# Patient Record
Sex: Female | Born: 1945 | Race: White | Hispanic: No | Marital: Married | Smoking: Former smoker
Health system: Southern US, Community
[De-identification: ages and names within clinical notes are randomized; demographics above are authoritative.]

## PROBLEM LIST (undated history)

## (undated) DIAGNOSIS — R7309 Other abnormal glucose: Secondary | ICD-10-CM

## (undated) DIAGNOSIS — N951 Menopausal and female climacteric states: Secondary | ICD-10-CM

## (undated) DIAGNOSIS — I10 Essential (primary) hypertension: Secondary | ICD-10-CM

## (undated) DIAGNOSIS — D126 Benign neoplasm of colon, unspecified: Secondary | ICD-10-CM

## (undated) DIAGNOSIS — E079 Disorder of thyroid, unspecified: Secondary | ICD-10-CM

## (undated) DIAGNOSIS — E785 Hyperlipidemia, unspecified: Secondary | ICD-10-CM

## (undated) DIAGNOSIS — Z1211 Encounter for screening for malignant neoplasm of colon: Secondary | ICD-10-CM

## (undated) DIAGNOSIS — Z1231 Encounter for screening mammogram for malignant neoplasm of breast: Secondary | ICD-10-CM

## (undated) DIAGNOSIS — G43909 Migraine, unspecified, not intractable, without status migrainosus: Secondary | ICD-10-CM

## (undated) DIAGNOSIS — G43109 Migraine with aura, not intractable, without status migrainosus: Secondary | ICD-10-CM

## (undated) DIAGNOSIS — N390 Urinary tract infection, site not specified: Secondary | ICD-10-CM

## (undated) DIAGNOSIS — K589 Irritable bowel syndrome without diarrhea: Secondary | ICD-10-CM

## (undated) DIAGNOSIS — I2510 Atherosclerotic heart disease of native coronary artery without angina pectoris: Secondary | ICD-10-CM

## (undated) DIAGNOSIS — K7581 Nonalcoholic steatohepatitis (NASH): Secondary | ICD-10-CM

## (undated) DIAGNOSIS — H3530 Unspecified macular degeneration: Secondary | ICD-10-CM

## (undated) DIAGNOSIS — K5730 Diverticulosis of large intestine without perforation or abscess without bleeding: Secondary | ICD-10-CM

## (undated) DIAGNOSIS — I38 Endocarditis, valve unspecified: Secondary | ICD-10-CM

## (undated) DIAGNOSIS — K7689 Other specified diseases of liver: Secondary | ICD-10-CM

## (undated) HISTORY — DX: Diverticulosis of large intestine without perforation or abscess without bleeding: K57.30

## (undated) HISTORY — DX: Migraine with aura, not intractable, without status migrainosus: G43.109

## (undated) HISTORY — DX: Encounter for screening for malignant neoplasm of colon: Z12.11

## (undated) HISTORY — DX: Irritable bowel syndrome, unspecified: K58.9

## (undated) HISTORY — DX: Nonalcoholic steatohepatitis (NASH): K75.81

## (undated) HISTORY — DX: Encounter for screening mammogram for malignant neoplasm of breast: Z12.31

## (undated) HISTORY — DX: Disorder of thyroid, unspecified: E07.9

## (undated) HISTORY — DX: Hyperlipidemia, unspecified: E78.5

## (undated) HISTORY — DX: Unspecified macular degeneration: H35.30

## (undated) HISTORY — PX: TONSILLECTOMY AND ADENOIDECTOMY: SUR1326

## (undated) HISTORY — DX: Benign neoplasm of colon, unspecified: D12.6

## (undated) HISTORY — DX: Other abnormal glucose: R73.09

## (undated) HISTORY — DX: Endocarditis, valve unspecified: I38

## (undated) HISTORY — DX: Migraine, unspecified, not intractable, without status migrainosus: G43.909

## (undated) HISTORY — DX: Essential (primary) hypertension: I10

## (undated) HISTORY — PX: ANKLE FRACTURE SURGERY: SHX122

## (undated) HISTORY — DX: Urinary tract infection, site not specified: N39.0

## (undated) HISTORY — PX: OTHER SURGICAL HISTORY: SHX169

## (undated) HISTORY — DX: Menopausal and female climacteric states: N95.1

## (undated) HISTORY — DX: Other specified diseases of liver: K76.89

---
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
## 2003-12-11 ENCOUNTER — Other Ambulatory Visit: Admission: RE | Admit: 2003-12-11 | Discharge: 2003-12-11 | Payer: Self-pay | Admitting: Internal Medicine

## 2004-04-06 ENCOUNTER — Encounter (INDEPENDENT_AMBULATORY_CARE_PROVIDER_SITE_OTHER): Payer: Self-pay | Admitting: Internal Medicine

## 2004-04-29 HISTORY — PX: OTHER SURGICAL HISTORY: SHX169

## 2004-09-28 ENCOUNTER — Emergency Department: Payer: Self-pay | Admitting: Internal Medicine

## 2005-04-06 ENCOUNTER — Encounter (INDEPENDENT_AMBULATORY_CARE_PROVIDER_SITE_OTHER): Payer: Self-pay | Admitting: Internal Medicine

## 2005-04-06 HISTORY — PX: OTHER SURGICAL HISTORY: SHX169

## 2005-04-17 ENCOUNTER — Other Ambulatory Visit: Admission: RE | Admit: 2005-04-17 | Discharge: 2005-04-17 | Payer: Self-pay | Admitting: Family Medicine

## 2005-04-17 ENCOUNTER — Ambulatory Visit: Payer: Self-pay | Admitting: Family Medicine

## 2005-04-18 ENCOUNTER — Ambulatory Visit: Payer: Self-pay | Admitting: Family Medicine

## 2005-04-19 ENCOUNTER — Ambulatory Visit: Payer: Self-pay | Admitting: Family Medicine

## 2005-05-06 LAB — FECAL OCCULT BLOOD, GUAIAC: Fecal Occult Blood: NEGATIVE

## 2005-05-16 ENCOUNTER — Ambulatory Visit: Payer: Self-pay | Admitting: Family Medicine

## 2005-05-31 ENCOUNTER — Ambulatory Visit: Payer: Self-pay | Admitting: Internal Medicine

## 2005-12-29 ENCOUNTER — Ambulatory Visit: Payer: Self-pay | Admitting: Family Medicine

## 2006-04-06 LAB — HM MAMMOGRAPHY: HM Mammogram: NORMAL

## 2006-04-18 ENCOUNTER — Ambulatory Visit: Payer: Self-pay | Admitting: Family Medicine

## 2006-05-04 ENCOUNTER — Ambulatory Visit: Payer: Self-pay | Admitting: Gastroenterology

## 2006-05-06 DIAGNOSIS — R7989 Other specified abnormal findings of blood chemistry: Secondary | ICD-10-CM | POA: Insufficient documentation

## 2006-05-06 DIAGNOSIS — Z8639 Personal history of other endocrine, nutritional and metabolic disease: Secondary | ICD-10-CM

## 2006-05-06 DIAGNOSIS — Z862 Personal history of diseases of the blood and blood-forming organs and certain disorders involving the immune mechanism: Secondary | ICD-10-CM

## 2006-05-11 ENCOUNTER — Ambulatory Visit: Payer: Self-pay | Admitting: Gastroenterology

## 2006-05-11 ENCOUNTER — Encounter: Payer: Self-pay | Admitting: Gastroenterology

## 2006-06-01 ENCOUNTER — Ambulatory Visit: Payer: Self-pay | Admitting: Gastroenterology

## 2006-06-08 ENCOUNTER — Encounter (INDEPENDENT_AMBULATORY_CARE_PROVIDER_SITE_OTHER): Payer: Self-pay | Admitting: Specialist

## 2006-06-08 ENCOUNTER — Ambulatory Visit: Payer: Self-pay | Admitting: Gastroenterology

## 2006-06-08 HISTORY — PX: COLONOSCOPY: SHX174

## 2006-07-06 ENCOUNTER — Encounter (INDEPENDENT_AMBULATORY_CARE_PROVIDER_SITE_OTHER): Payer: Self-pay | Admitting: *Deleted

## 2006-07-06 ENCOUNTER — Ambulatory Visit (HOSPITAL_COMMUNITY): Admission: RE | Admit: 2006-07-06 | Discharge: 2006-07-06 | Payer: Self-pay | Admitting: Gastroenterology

## 2006-07-06 ENCOUNTER — Encounter: Payer: Self-pay | Admitting: Gastroenterology

## 2006-07-20 ENCOUNTER — Ambulatory Visit: Payer: Self-pay | Admitting: Gastroenterology

## 2006-08-01 ENCOUNTER — Encounter: Admission: RE | Admit: 2006-08-01 | Discharge: 2006-10-30 | Payer: Self-pay | Admitting: Gastroenterology

## 2006-08-31 ENCOUNTER — Ambulatory Visit: Payer: Self-pay | Admitting: Family Medicine

## 2006-09-07 ENCOUNTER — Ambulatory Visit: Payer: Self-pay | Admitting: Gastroenterology

## 2006-11-16 ENCOUNTER — Ambulatory Visit: Payer: Self-pay | Admitting: Family Medicine

## 2007-11-05 ENCOUNTER — Ambulatory Visit: Payer: Self-pay | Admitting: Family Medicine

## 2007-11-05 DIAGNOSIS — H6690 Otitis media, unspecified, unspecified ear: Secondary | ICD-10-CM | POA: Insufficient documentation

## 2007-11-05 DIAGNOSIS — M810 Age-related osteoporosis without current pathological fracture: Secondary | ICD-10-CM | POA: Insufficient documentation

## 2007-12-13 ENCOUNTER — Encounter (INDEPENDENT_AMBULATORY_CARE_PROVIDER_SITE_OTHER): Payer: Self-pay | Admitting: Internal Medicine

## 2007-12-20 ENCOUNTER — Encounter (INDEPENDENT_AMBULATORY_CARE_PROVIDER_SITE_OTHER): Payer: Self-pay | Admitting: *Deleted

## 2007-12-27 ENCOUNTER — Encounter (INDEPENDENT_AMBULATORY_CARE_PROVIDER_SITE_OTHER): Payer: Self-pay | Admitting: Internal Medicine

## 2007-12-27 DIAGNOSIS — G43909 Migraine, unspecified, not intractable, without status migrainosus: Secondary | ICD-10-CM | POA: Insufficient documentation

## 2007-12-27 DIAGNOSIS — I10 Essential (primary) hypertension: Secondary | ICD-10-CM | POA: Insufficient documentation

## 2007-12-27 DIAGNOSIS — N951 Menopausal and female climacteric states: Secondary | ICD-10-CM

## 2008-01-10 ENCOUNTER — Ambulatory Visit: Payer: Self-pay | Admitting: Family Medicine

## 2008-01-10 DIAGNOSIS — R82998 Other abnormal findings in urine: Secondary | ICD-10-CM

## 2008-01-10 DIAGNOSIS — B9789 Other viral agents as the cause of diseases classified elsewhere: Secondary | ICD-10-CM

## 2008-01-10 LAB — CONVERTED CEMR LAB
Bilirubin Urine: NEGATIVE
Blood in Urine, dipstick: NEGATIVE
Casts: 0 /lpf
Epithelial cells, urine: 0 /lpf
Glucose, Urine, Semiquant: NEGATIVE
Ketones, urine, test strip: NEGATIVE
Nitrite: NEGATIVE
Protein, U semiquant: NEGATIVE
RBC / HPF: 0
Specific Gravity, Urine: <1.005
Urine crystals, microscopic: 0 /hpf
Urobilinogen, UA: NEGATIVE
pH: 7

## 2008-09-11 ENCOUNTER — Ambulatory Visit: Payer: Self-pay | Admitting: Family Medicine

## 2008-09-11 DIAGNOSIS — J069 Acute upper respiratory infection, unspecified: Secondary | ICD-10-CM | POA: Insufficient documentation

## 2008-09-11 DIAGNOSIS — N390 Urinary tract infection, site not specified: Secondary | ICD-10-CM | POA: Insufficient documentation

## 2008-09-11 LAB — CONVERTED CEMR LAB
Bilirubin Urine: NEGATIVE
Glucose, Urine, Semiquant: NEGATIVE
Specific Gravity, Urine: 1.015
pH: 7.5

## 2009-05-13 ENCOUNTER — Telehealth: Payer: Self-pay | Admitting: Gastroenterology

## 2009-09-10 ENCOUNTER — Ambulatory Visit: Payer: Self-pay | Admitting: Family Medicine

## 2009-09-10 LAB — CONVERTED CEMR LAB
Ketones, urine, test strip: NEGATIVE
Nitrite: NEGATIVE
Urobilinogen, UA: 0.2

## 2009-11-09 ENCOUNTER — Encounter (INDEPENDENT_AMBULATORY_CARE_PROVIDER_SITE_OTHER): Payer: Self-pay | Admitting: Internal Medicine

## 2010-08-04 ENCOUNTER — Telehealth (INDEPENDENT_AMBULATORY_CARE_PROVIDER_SITE_OTHER): Payer: Self-pay | Admitting: *Deleted

## 2010-08-05 ENCOUNTER — Ambulatory Visit: Payer: Self-pay | Admitting: Family Medicine

## 2010-08-07 LAB — CONVERTED CEMR LAB
AST: 41 units/L — ABNORMAL HIGH (ref 0–37)
Alkaline Phosphatase: 46 units/L (ref 39–117)
Bilirubin, Direct: 0.2 mg/dL (ref 0.0–0.3)
Cholesterol: 185 mg/dL (ref 0–200)
GFR calc non Af Amer: 98.95 mL/min (ref >60)
LDL Cholesterol: 114 mg/dL — ABNORMAL HIGH (ref 0–99)
Potassium: 4.6 meq/L (ref 3.5–5.1)
Sodium: 139 meq/L (ref 135–145)
TSH: 2.77 microintl units/mL (ref 0.35–5.50)
Total Bilirubin: 0.7 mg/dL (ref 0.3–1.2)
VLDL: 29 mg/dL (ref 0.0–40.0)

## 2010-08-08 LAB — CONVERTED CEMR LAB: Vit D, 25-Hydroxy: 33 ng/mL (ref 30–89)

## 2010-08-12 ENCOUNTER — Ambulatory Visit: Payer: Self-pay | Admitting: Family Medicine

## 2010-08-12 DIAGNOSIS — R739 Hyperglycemia, unspecified: Secondary | ICD-10-CM

## 2010-08-24 ENCOUNTER — Encounter (INDEPENDENT_AMBULATORY_CARE_PROVIDER_SITE_OTHER): Payer: Self-pay | Admitting: *Deleted

## 2010-10-20 ENCOUNTER — Telehealth: Payer: Self-pay | Admitting: Gastroenterology

## 2010-10-21 ENCOUNTER — Ambulatory Visit: Payer: Self-pay | Admitting: Gastroenterology

## 2010-10-28 ENCOUNTER — Ambulatory Visit: Payer: Self-pay | Admitting: Gastroenterology

## 2010-11-04 DIAGNOSIS — K760 Fatty (change of) liver, not elsewhere classified: Secondary | ICD-10-CM | POA: Insufficient documentation

## 2010-11-04 DIAGNOSIS — Z860100 Personal history of colon polyps, unspecified: Secondary | ICD-10-CM | POA: Insufficient documentation

## 2010-11-04 DIAGNOSIS — K5730 Diverticulosis of large intestine without perforation or abscess without bleeding: Secondary | ICD-10-CM | POA: Insufficient documentation

## 2010-11-04 DIAGNOSIS — K7689 Other specified diseases of liver: Secondary | ICD-10-CM

## 2010-11-11 ENCOUNTER — Other Ambulatory Visit: Payer: Self-pay | Admitting: Gastroenterology

## 2010-11-11 ENCOUNTER — Ambulatory Visit
Admission: RE | Admit: 2010-11-11 | Discharge: 2010-11-11 | Payer: Self-pay | Source: Home / Self Care | Attending: Gastroenterology | Admitting: Gastroenterology

## 2010-11-11 ENCOUNTER — Encounter: Payer: Self-pay | Admitting: Gastroenterology

## 2010-11-11 ENCOUNTER — Encounter (INDEPENDENT_AMBULATORY_CARE_PROVIDER_SITE_OTHER): Payer: Self-pay | Admitting: *Deleted

## 2010-11-11 DIAGNOSIS — K589 Irritable bowel syndrome without diarrhea: Secondary | ICD-10-CM | POA: Insufficient documentation

## 2010-11-11 LAB — CBC WITH DIFFERENTIAL/PLATELET
Basophils Absolute: 0 10*3/uL (ref 0.0–0.1)
Basophils Relative: 0.6 % (ref 0.0–3.0)
Eosinophils Absolute: 0.1 10*3/uL (ref 0.0–0.7)
Eosinophils Relative: 1.4 % (ref 0.0–5.0)
HCT: 41 % (ref 36.0–46.0)
Hemoglobin: 14.4 g/dL (ref 12.0–15.0)
Lymphocytes Relative: 45.7 % (ref 12.0–46.0)
Lymphs Abs: 2.4 10*3/uL (ref 0.7–4.0)
MCHC: 35 g/dL (ref 30.0–36.0)
MCV: 92.3 fl (ref 78.0–100.0)
Monocytes Absolute: 0.4 10*3/uL (ref 0.1–1.0)
Monocytes Relative: 6.9 % (ref 3.0–12.0)
Neutro Abs: 2.4 10*3/uL (ref 1.4–7.7)
Neutrophils Relative %: 45.4 % (ref 43.0–77.0)
Platelets: 182 10*3/uL (ref 150.0–400.0)
RBC: 4.45 Mil/uL (ref 3.87–5.11)
RDW: 12.5 % (ref 11.5–14.6)
WBC: 5.2 10*3/uL (ref 4.5–10.5)

## 2010-11-11 LAB — IBC PANEL
Iron: 76 ug/dL (ref 42–145)
Saturation Ratios: 18.5 % — ABNORMAL LOW (ref 20.0–50.0)
Transferrin: 293.1 mg/dL (ref 212.0–360.0)

## 2010-11-11 LAB — FOLATE: Folate: 8.7 ng/mL

## 2010-11-11 LAB — FERRITIN: Ferritin: 156 ng/mL (ref 10.0–291.0)

## 2010-11-11 LAB — IGA: IgA: 325 mg/dL (ref 68–378)

## 2010-11-11 LAB — VITAMIN B12: Vitamin B-12: 238 pg/mL (ref 211–911)

## 2010-11-18 ENCOUNTER — Encounter: Payer: Self-pay | Admitting: Gastroenterology

## 2010-11-18 ENCOUNTER — Ambulatory Visit
Admission: RE | Admit: 2010-11-18 | Discharge: 2010-11-18 | Payer: Self-pay | Source: Home / Self Care | Attending: Gastroenterology | Admitting: Gastroenterology

## 2010-11-18 DIAGNOSIS — E538 Deficiency of other specified B group vitamins: Secondary | ICD-10-CM | POA: Insufficient documentation

## 2010-11-18 LAB — HM COLONOSCOPY

## 2010-11-21 ENCOUNTER — Telehealth: Payer: Self-pay | Admitting: Family Medicine

## 2010-11-22 ENCOUNTER — Encounter: Payer: Self-pay | Admitting: Gastroenterology

## 2010-11-25 ENCOUNTER — Ambulatory Visit
Admission: RE | Admit: 2010-11-25 | Discharge: 2010-11-25 | Payer: Self-pay | Source: Home / Self Care | Attending: Family Medicine | Admitting: Family Medicine

## 2010-12-02 ENCOUNTER — Ambulatory Visit
Admission: RE | Admit: 2010-12-02 | Discharge: 2010-12-02 | Payer: Self-pay | Source: Home / Self Care | Attending: Family Medicine | Admitting: Family Medicine

## 2010-12-06 NOTE — Progress Notes (Signed)
----   Converted from flag ---- ---- 08/04/2010 1:46 PM, Elsie Stain MD wrote: vit D 733.0 cmet/lipid 401.1  ---- 08/04/2010 9:58 AM, Daralene Milch CMA (AAMA) wrote: Lab orders please! Good Morning! This pt is scheduled for cpx labs tomorrow, which labs to draw and dx codes to use? Thanks Tasha ------------------------------

## 2010-12-06 NOTE — Letter (Signed)
Summary: Cancer Registry Form/[facility] Form/[facility]   Imported By: Edmonia James 11/22/2009 08:50:33  _____________________________________________________________________  External Attachment:    Type:   Image     Comment:   External Document

## 2010-12-06 NOTE — Letter (Signed)
Summary: Pre Visit Letter Revised  [facility]  [place], [place] [address]   Phone: [phone]  Fax: [phone]        08/24/2010 MRN: [id number] [facility] [address] [place], [place]  [address]             Procedure Date:  10/28/2010   Welcome to the Gastroenterology Division at [facility].    You are scheduled to see a nurse for your pre-procedure visit on 10/14/2010 at 1:30PM on the 3rd floor at [facility], [place] [employer].  We ask that you try to arrive at our office 15 minutes prior to your appointment time to allow for check-in.  Please take a minute to review the attached form.  If you answer "Yes" to one or more of the questions on the first page, we ask that you call the person listed at your earliest opportunity.  If you answer "No" to all of the questions, please complete the rest of the form and bring it to your appointment.    Your nurse visit will consist of discussing your medical and surgical history, your immediate family medical history, and your medications.   If you are unable to list all of your medications on the form, please bring the medication bottles to your appointment and we will list them.  We will need to be aware of both prescribed and over the counter drugs.  We will need to know exact dosage information as well.    Please be prepared to read and sign documents such as consent forms, a financial agreement, and acknowledgement forms.  If necessary, and with your consent, a friend or relative is welcome to sit-in on the nurse visit with you.  Please bring your insurance card so that we may make a copy of it.  If your insurance requires a referral to see a specialist, please bring your referral form from your primary care physician.  No co-pay is required for this nurse visit.     If you cannot keep your appointment, please call [phone] to cancel or reschedule prior to your appointment date.  This  allows [place] the opportunity to schedule an appointment for another patient in need of care.    Thank you for choosing [facility] for your medical needs.  We appreciate the opportunity to care for you.  Please visit [place] at our website  to learn more about our practice.  Sincerely, The Gastroenterology Division

## 2010-12-06 NOTE — Assessment & Plan Note (Signed)
Summary: CPX/BILLIE'S PT/CLE   Vital Signs:  Patient profile:   65 year old female Height:      61.75 inches Weight:      164.50 pounds BMI:     30.44 Temp:     98.4 degrees F oral Pulse rate:   84 / minute Pulse rhythm:   regular BP sitting:   132 / 80  (left arm) Cuff size:   regular  Vitals Entered By: [place] Deborra Medina) 23-Aug-2010 1:59 PM) CC: CPX (BDB pt)   History of Present Illness: Osteoporosis.  Is due for DXA.  Prev had been on fosamax for 5 years per patient  Occ fatigue noted after high carb meals, esp in afternoon.   No FCNAVD.  Occ trouble with fecal urgency.  Worse with certain food (iceberg lettuce and nuts).    H/o fatty liver.  LFTs d/w patient.  See plan.   Hyperglycemia d/w patient.   See plan.   Pt is doing breast exams and hasn't noticed lump, mass, change, discharge.  No other c/o of any variety.   Allergies: 1)  ! Pcn 2)  ! 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 Sharlett Iles)  Family History: Reviewed history and no changes required. F dead, MI at 67 M alive, in [place], dx'd with manic depression/parkinson's, DM2  Social History: Reviewed history and no changes required. 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  no murmur PULM: ctab, no inc wob ABD: soft, +bs EXT: no edema SKIN: no acute rash    Impression & Recommendations:  Problem # 1:  OTHER SCREENING MAMMOGRAM (ICD-V76.12) Refer for mammogram.  Orders: Radiology Referral (Radiology)  Problem # 2:  LIVER FUNCTION TESTS, ABNORMAL, HX OF (ICD-V12.2) D/w patient [id number] loss. We can follow this yearly.   Problem # 3:  OSTEOPOROSIS (ICD-733.00) Send for DXA.  No need for further fosamax.  The following medications were removed from the medication list:    Fosamax 70 Mg Tabs (Alendronate sodium) .Marland Kitchen... 1 every wk    Caltrate 600+d 600-400 Mg-unit Tabs (Calcium carbonate-vitamin d) .Marland Kitchen... Take 1 tablet by mouth two times a day  Orders: Radiology Referral (Radiology)  Problem # 4:  HYPERGLYCEMIA (ICD-790.29) D/w patient [id number] loss, diet and exercise.   Problem # 5:  SCREENING, COLON CANCER (ICD-V76.51) Refer to GI.  Orders: Gastroenterology Referral (GI)  Other Orders: Flu Vaccine 19yr + MEDICARE PATIENTS ((J4782 Administration Flu vaccine - MCR (G0008) Tdap => 725yrIM (9(95621Pneumococcal Vaccine ([phone] 1st Vaccine ([phone] of  Any Addtl Vaccine (37096)  Patient Instructions: 1)  See Rosaria Ferries about your referral before your leave today.   2)  Work on exercising more and gradually losing weight.   3)  Check with your insurance to see if they will cover the shingles shot.   4)  Recheck in 1 year, sooner as needed.  5)  Take care, glad to see you today.   Prior Medications (reviewed today): None Current Allergies (reviewed today): ! PCN ! SULFA   Immunizations Administered:  Tetanus Vaccine:    Vaccine Type: Tdap    Site: left deltoid    Mfr:  GlaxoSmithKline    Dose: 0.5 ml    Route: IM    Given by: Christena Deem CMA ([place])    Exp. Date: 07/18/2012    Lot #: [id number]    VIS given: 09/23/08 version given August 12, 2010.  Pneumonia Vaccine:    Vaccine Type: Pneumovax (Medicare)    Site: right deltoid    Mfr: Merck    Dose: 0.5 ml    Route: IM    Given by: Christena Deem CMA ([place])    Exp. Date: 01/16/2014    Lot #: [id number]    VIS given: 10/11/09 version given August 12, 2010. Flu Vaccine Consent Questions     Do you have a history of severe allergic reactions to this vaccine? no    Any prior history of allergic reactions to egg and/or gelatin? no    Do you have a sensitivity to the preservative Thimersol? no    Do you have a past history of Guillan-Barre Syndrome? no    Do you currently have an acute febrile illness? no    Have you ever had a severe reaction to latex? no    Vaccine information given and explained to patient? yes    Are you currently pregnant? no    Lot Number:[id number]   Exp Date:05/06/2011   Site Given  Left Deltoid IMedflu Lugene Fuquay CMA (AAMA)  August 12, 2010 3:26 PM

## 2010-12-08 NOTE — Progress Notes (Signed)
Summary: needs B12 injections  Phone Note Call from Patient Call back at Work Phone [phone]   Caller: Mom Summary of Call: Pt had lab work with Dr Sharlett Iles and her B12 level came back low.  She needs to start getting monthly injections and wants to get them here, but she would prefer to get nascobal instead of injections.  Uses [facility] [address]. Initial call taken by: Marty Heck CMA, AAMA,  November 21, 2010 8:51 AM  Follow-up for Phone Call        Please have GI send in the rx so she can do the nasal spray per their directions.  It was listed as an option on the prev GI append from 11/18/09.  Follow-up by: Elsie Stain MD,  November 21, 2010 1:28 PM  Additional Follow-up for Phone Call Additional follow up Details #1::        OK... Additional Follow-up by: Sable Feil MD [place] Saa 2:58 PM     Appended Document: needs B12 injections     Phone Note Outgoing Call Call back at Work Phone [phone]   Call placed by: Bernita Buffy CMA Deborra Medina),  November 21, 2010 4:30 PM Call placed to: Patient Summary of Call: Left a message on patients machine to call back. patient can do nascobal but needs the three induction injections once a week for three weeks then she can have an rx for nascobal. Initial call taken by: Bernita Buffy CMA Deborra Medina),  November 21, 2010 4:30 PM  Follow-up for Phone Call        spoke to the patient and advised her she can do the nascobal once she has had the three induction injections. See patients meds or the bottomo of the last labs. Patient would like to have the 3 injections once a week done at Dr. Carole Civil office I will route this message to his CMA and have her contact the patient about setting up the injections. I will rx nascobal once all three injections are done.  Follow-up by: Bernita Buffy CMA Deborra Medina),  November 22, 2010 8:38 AM  Additional Follow-up for Phone Call Additional follow up Details #1::        Scheduled   patient for her injections.  Lacretia Nicks  November 22, 2010 9:45 AM     Additional Follow-up for Phone Call Additional follow up Details #2::    thanks Follow-up by: Sable Feil MD Marval Regal,  November 22, 2010 10:55 AM

## 2010-12-08 NOTE — Assessment & Plan Note (Signed)
Summary: b12/alc   Nurse Visit   Allergies: 1)  ! Pcn 2)  ! Sulfa  Medication Administration  Injection # 1:    Medication: Vit B12 1000 mcg    Diagnosis: VITAMIN B12 DEFICIENCY (ICD-266.2)    Route: IM    Site: L deltoid    Exp Date: 09/05/2012    Lot #: [id number]    Mfr: American Regent    Comments: Per Dr. Damita Dunnings    Patient tolerated injection without complications    Given by: Maudie Mercury Dance CMA ([place]) (December 02, 2010 8:20 AM)  Orders Added: 1)  Admin of Therapeutic Inj  intramuscular or subcutaneous [96372] 2)  Vit B12 1000 mcg [A6301]

## 2010-12-08 NOTE — Progress Notes (Signed)
Summary: re: appt   Phone Note Outgoing Call   Call placed by: Randall Hiss RN,  October 20, 2010 11:15 AM Call placed to: Patient Summary of Call: While preparing pt's chart for PV on 10-21-10, writer noted that Dr. Sharlett Iles wants pt to have an OV before next colonscopy (pt was due in 2010 and was contacted by nurse to set up [place] before next colonoscopy but pt was not able to do so).  Attempted to reach pt to set up appt but message left on cell phone. Initial call taken by: Randall Hiss RN,  October 20, 2010 11:16 AM  Follow-up for Phone Call        Spoke with pt and above situation explained.  OV set up for 11-11-09 at 11:30 and pt told to come to the third floor.  Understanding voiced and pt has no further questions at this time Follow-up by: Randall Hiss RN,  October 20, 2010 11:17 AM

## 2010-12-08 NOTE — Assessment & Plan Note (Signed)
Summary: b12/alc   Nurse Visit   Allergies: 1)  ! Pcn 2)  ! Sulfa  Medication Administration  Injection # 1:    Medication: Vit B12 1000 mcg    Diagnosis: VITAMIN B12 DEFICIENCY (ICD-266.2)    Route: IM    Site: L deltoid    Exp Date: 06/05/2012    Lot #: [id number]    Mfr: [place]    Patient tolerated injection without complications    Given by: Christena Deem CMA Deborra Medina) (November 28, 2010 9:44 AM)  Orders Added: 1)  Admin of Therapeutic Inj  intramuscular or subcutaneous [96372] 2)  Vit B12 1000 mcg [J3420]   Medication Administration  Injection # 1:    Medication: Vit B12 1000 mcg    Diagnosis: VITAMIN B12 DEFICIENCY (ICD-266.2)    Route: IM    Site: L deltoid    Exp Date: 06/05/2012    Lot #: [id number]    Mfr: American Regent    Patient tolerated injection without complications    Given by: Christena Deem CMA ([place]) (November 28, 2010 9:44 AM)  Orders Added: 1)  Admin of Therapeutic Inj  intramuscular or subcutaneous [96372] 2)  Vit B12 1000 mcg [B8485]

## 2010-12-08 NOTE — Letter (Signed)
Summary: [facility] Instructions  [facility]  [place], [place] [address]   Phone: [phone]  Fax: [phone]       Susan [name]    1946-07-10    MRN: [id number]        Procedure Day /Date: Friday 11/18/2010     Arrival Time: 2:00pm     Procedure Time: 3:00pm     Location of Procedure:                    X  [facility] (4th Floor)   [place]   Starting 5 days prior to your procedure 11/13/2010 do not eat nuts, seeds, popcorn, corn, beans, peas,  salads, or any raw vegetables.  Do not take any fiber supplements (e.g. Metamucil, Citrucel, and Benefiber).  THE DAY BEFORE YOUR PROCEDURE         Thursday 11/17/2010  1.  Drink clear liquids the entire day-NO SOLID FOOD  2.  Do not drink anything colored red or purple.  Avoid juices with pulp.  No orange juice.  3.  Drink at least 64 oz. (8 glasses) of fluid/clear liquids during the day to prevent dehydration and help the prep work efficiently.  CLEAR LIQUIDS INCLUDE: Water Jello Ice Popsicles Tea (sugar ok, no milk/cream) Powdered fruit flavored drinks Coffee (sugar ok, no milk/cream) Gatorade Juice: apple, white grape, white cranberry  Lemonade Clear bullion, consomm, broth Carbonated beverages (any kind) Strained chicken noodle soup Hard Candy                             4.  In the morning, mix first dose of MoviPrep solution:    Empty 1 Pouch A and 1 Pouch B into the disposable container    Add lukewarm drinking water to the top line of the container. Mix to dissolve    Refrigerate (mixed solution should be used within 24 hrs)  5.  Begin drinking the prep at 5:00 p.m. The MoviPrep container is divided by 4 marks.   Every 15 minutes drink the solution down to the next mark (approximately 8 oz) until the full liter is complete.   6.  Follow completed prep with 16 oz of clear liquid of your choice (Nothing red or purple).  Continue to  drink clear liquids until bedtime.  7.  Before going to bed, mix second dose of MoviPrep solution:    Empty 1 Pouch A and 1 Pouch B into the disposable container    Add lukewarm drinking water to the top line of the container. Mix to dissolve    Refrigerate  THE DAY OF YOUR PROCEDURE      Friday 11/18/2010  Beginning at 10:00am (5 hours before procedure):         1. Every 15 minutes, drink the solution down to the next mark (approx 8 oz) until the full liter is complete.  2. Follow completed prep with 16 oz. of clear liquid of your choice.    3. You may drink clear liquids until 1:00pm (2 HOURS BEFORE PROCEDURE).   MEDICATION INSTRUCTIONS  Unless otherwise instructed, you should take regular prescription medications with a small sip of water   as early as possible the morning of your procedure.           OTHER INSTRUCTIONS  You will need a responsible adult at least 66 years of age to accompany you and  drive you home.   This person must remain in the waiting room during your procedure.  Wear loose fitting clothing that is easily removed.  Leave jewelry and other valuables at home.  However, you may wish to bring a book to read or  an iPod/MP3 player to listen to music as you wait for your procedure to start.  Remove all body piercing jewelry and leave at home.  Total time from sign-in until discharge is approximately 2-3 hours.  You should go home directly after your procedure and rest.  You can resume normal activities the  day after your procedure.  The day of your procedure you should not:   Drive   Make legal decisions   Operate machinery   Drink alcohol   Return to work  You will receive specific instructions about eating, activities and medications before you leave.    The above instructions have been reviewed and explained to me by   _______________________    I fully understand and can verbalize these instructions  _____________________________ Date _________

## 2010-12-08 NOTE — Assessment & Plan Note (Signed)
Summary: REV before colonoscopy/kw    History of Present Illness Visit Type: new patient  Primary GI MD: Verl Blalock MD [place] Primary Provider: Elsie Stain, MD  Requesting Provider: na Chief Complaint: Consult colon.  Pt c/o diarrhea, fecal incontinence, and fatigue  History of Present Illness:   65 year old Caucasian female who has periodic rectal urgency and incontinency with previous colonoscopy 5 years ago  showing an adenomatous polyp that was removed. There was high-grade dysplasia in this polyp. She currently is asymptomatic except for periodic incontinency and rectal urgency. She does not use sorbitol or fructose. She denies abdominal pain or other GI complaints. Recent laboratory data showed a normal metabolic profile. I have worked her up in the past for abnormal liver function test, and liver biopsy was consistent with fatty liver,NASH syndrome and early fibrosis. She has no symptoms of progressive liver disease, and her liver function tests have been stable.    GI Review of Systems      Denies abdominal pain, acid reflux, belching, bloating, chest pain, dysphagia with liquids, dysphagia with solids, heartburn, loss of appetite, nausea, vomiting, vomiting blood, weight loss, and  weight gain.      Reports diarrhea and  fecal incontinence.     Denies anal fissure, black tarry stools, change in bowel habit, constipation, diverticulosis, heme positive stool, hemorrhoids, irritable bowel syndrome, jaundice, light color stool, liver problems, rectal bleeding, and  rectal pain.    Current Medications (verified): 1)  Tylenol 325 Mg Tabs (Acetaminophen) .... As Needed 2)  Ibuprofen 200 Mg Tabs (Ibuprofen) .... As Needed  Allergies (verified): 1)  ! Pcn 2)  ! Sulfa  Past History:  Past medical, surgical, family and social histories (including risk factors) reviewed for relevance to current acute and chronic problems.  Past Medical History: Hypertension- elevated at  clinic but had been wnl outside of clinic Osteoporosis- on fosamax for 5 years [place] (ICD-562.10) OTHER CHRONIC NONALCOHOLIC LIVER DISEASE (YYT-035.8) SCREENING, COLON CANCER (ICD-V76.51) OTHER SCREENING MAMMOGRAM (ICD-V76.12) HYPERGLYCEMIA (ICD-790.29) URI (ICD-465.9) UTI (ICD-599.0) OTHER NONSPECIFIC FINDING EXAMINATION OF URINE (ICD-791.9) VIRAL INFECTION (ICD-079.99) MIGRAINE HEADACHE (ICD-346.90) LIVER FUNCTION TESTS, ABNORMAL, HX OF (ICD-V12.2) MENOPAUSAL SYNDROME (ICD-627.2) OSTEOPOROSIS (ICD-733.00) LOM (ICD-382.9)  Past Surgical History: Reviewed history from 08/12/2010 and no changes required. BTL 30+ yrs ago T & A  age 63 pinned R) ankle -- ~2002 ABD [place] 04/12/2004  pos gallstones / 6/06 gallstones CT ABD pos gallstones 04/29/04 US pelvis neg --6/06 colonoscopy --polyps divertics 06/08/06 (Dr. Sharlett Iles)  Family History: Reviewed history from 08/12/2010 and no changes required. F dead, MI at 67 M alive, in NH, dx'd with manic depression/parkinson's, DM2 No FH of Colon Cancer: Family History of Colon Polyps:Father   Social History: Reviewed history from 08/12/2010 and no changes required. 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  The patient denies anemia, anxiety-new, blood in urine, breast changes/lumps, change in vision, confusion, cough, coughing up blood, depression-new, fainting, fever, headaches-new, hearing problems, heart murmur, heart rhythm changes, itching, menstrual pain, muscle pains/cramps, night sweats, nosebleeds, pregnancy symptoms, shortness of breath, skin rash, sleeping problems, sore throat, swelling of feet/legs, swollen  lymph glands, thirst - excessive , urination - excessive ,  urination changes/pain, urine leakage, vision changes, and voice change.    Vital Signs:  Patient profile:   65 year old female Height:      61.75 inches Weight:      164 pounds BMI:     30.35 BSA:     1.75 Pulse rate:   90 / minute Pulse rhythm:   regular BP sitting:   132 / 80  (left arm) Cuff size:   regular  Vitals Entered By: Hope Pigeon CMA (November 11, 2010 11:34 AM) CC: Consult colon.  Pt c/o diarrhea, fecal incontinence, and fatigue    Physical Exam  General:  Well developed, well nourished, no acute distress.healthy appearing. No stigmata of chronic liver disease noted. healthy appearing and obese.   Head:  Normocephalic and atraumatic. Eyes:  PERRLA, no icterus.exam deferred to patient's ophthalmologist.   Neck:  Supple; no masses or thyromegaly. Lungs:  Clear throughout to auscultation. Heart:  Regular rate and rhythm; no murmurs, rubs,  or bruits. Abdomen:  Soft, nontender and nondistended. No masses, hepatosplenomegaly or hernias noted. Normal bowel sounds. Rectal:  Normal exam.There is absolutely no rectal tone noted and no squeeze pressure on exam. There is stool in the rectal vault that is very soft and guaiac-negative. I cannot appreciate any masses or tenderness or evidence of rectocele. Extremities:  No clubbing, cyanosis, edema or deformities noted. Neurologic:  Alert and  oriented x4;  grossly normal neurologically. Psych:  Alert and cooperative. Normal mood and affect.   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 Her symptoms also suggest possible malabsorption of non-digestible carbohydrates. Screen for malabsorption has been ordered and we will followup on her colonoscopy exam. She may need referral for anorectal manometry exam. Orders: TLB-B12, Serum-Total  ONLY (40814-G81) TLB-Ferritin (85631-SHF) TLB-Folic Acid (Folate) (02637-CHY) TLB-IBC Pnl (Iron/FE;Transferrin) (83550-IBC) TLB-CBC Platelet - w/Differential (85025-CBCD) TLB-IgA (Immunoglobulin A) (82784-IGA) T-Sprue Panel (Celiac Disease Aby Eval) (83516x3/[address]) Colonoscopy (Colon)  Problem # 2:  COLONIC POLYPS, HX OF (ICD-V12.72) Assessment: Unchanged Colonoscopy scheduled her convenience. Orders: TLB-B12, Serum-Total ONLY (85027-X41) TLB-Ferritin (28786-VEH) TLB-Folic Acid (Folate) (20947-SJG) TLB-IBC Pnl (Iron/FE;Transferrin) (83550-IBC) TLB-CBC Platelet - w/Differential (85025-CBCD) TLB-IgA (Immunoglobulin A) (82784-IGA) T-Sprue Panel (Celiac Disease Aby Eval) (83516x3/[address]) Colonoscopy (Colon)  Problem # 3:  LIVER FUNCTION TESTS, ABNORMAL, HX OF (ICD-V12.2) Assessment: Unchanged  Known NASH syndrome with stable fibrosis. We will continue to follow her liver function test periodically. There's been no evidence of hepatic decompensation.  Patient Instructions: 1)  Copy sent to : Elsie Stain, MD  2)  Your procedure has been scheduled for 11/18/2010, please follow the seperate instructions.  3)  Your prescription(s) have been sent to you pharmacy.  4)  Please go to the basement today for your labs.  5)  [place] Patient Information Guide given to patient.  6)  Colonoscopy and Flexible Sigmoidoscopy brochure given.  7)  The medication list was reviewed and reconciled.  All changed / newly prescribed medications were explained.  A complete medication list was provided to the patient / caregiver. Prescriptions: MOVIPREP 100 GM  SOLR (PEG-KCL-NACL-NASULF-NA ASC-C) As per prep instructions.  #1 x 0   Entered by:   Bernita Buffy CMA ([place])   Authorized by:   Sable Feil MD [facility]   Signed by:   Bernita Buffy CMA ([place]) on 11/11/2010   Method used:   Electronically to  [facility]  [address] [phone].* (retail)       [address]       [place], [place]  [address]       Ph: [phone] or [phone]       Fax: [phone]   RxID:   [id number]

## 2010-12-08 NOTE — Letter (Signed)
Summary: Patient Notice- Polyp Results  [facility]  [address] [place], [place] [address]   Phone: [phone]  Fax: [phone]        November 22, 2010 MRN: [id number]    [facility] [address] [place], [place]  [address]    Dear Ms. Holte,  I am pleased to inform you that the colon polyp(s) removed during your recent colonoscopy was (were) found to be benign (no cancer detected) upon pathologic examination.  I recommend you have a repeat colonoscopy examination in 3_ years to look for recurrent polyps, as having colon polyps increases your risk for having recurrent polyps or even colon cancer in the future.I would recommend followup in 3 years per multiple right colon adenomas.  Should you develop new or worsening symptoms of abdominal pain, bowel habit changes or bleeding from the rectum or bowels, please schedule an evaluation with either your primary care physician or with me.  Additional information/recommendations:  __ No further action with gastroenterology is needed at this time. Please      follow-up with your primary care physician for your other healthcare      needs.  __ Please call [phone] to schedule a return visit to review your      situation.  __ Please keep your follow-up visit as already scheduled.  x__ Continue treatment plan as outlined the day of your exam.  Please call us if you are having persistent problems or have questions about your condition that have not been fully answered at this time.  Sincerely,  Sable Feil MD [facility]  This letter has been electronically signed by your physician.  Appended Document: Patient Notice- Polyp Results Letter mailed

## 2010-12-08 NOTE — Procedures (Signed)
Summary: Colonoscopy & Pathology   Colonoscopy  Procedure date:  06/08/2006  Findings:      Location:  [facility].   Patient Name: Susan [name], Susan [name] MRN:  Procedure Procedures: Colonoscopy CPT: [phone].  Personnel: Endoscopist: Loralee Pacas. Sharlett Iles, MD.  Exam Location: Exam performed in Outpatient Clinic. Outpatient  Patient Consent: Procedure, Alternatives, Risks and Benefits discussed, consent obtained, from patient. Consent was obtained by the RN.  Indications  Average Risk Screening Routine.  History  Current Medications: Patient is not currently taking Coumadin.  Pre-Exam Physical: Performed Jun 08, 2006. Cardio-pulmonary exam, Rectal exam, Abdominal exam, Extremity exam, Mental status exam WNL.  Comments: Pt. history reviewed/updated, physical exam performed prior to initiation of sedation? Exam Exam: Extent of exam reached: Cecum, extent intended: Cecum.  The cecum was identified by appendiceal orifice and IC valve. Patient position: on left side. Time for Withdrawl: 11:06:23. Colon retroflexion performed. Images taken. ASA Classification: II. Tolerance: excellent.  Monitoring: Pulse and BP monitoring, Oximetry used. Supplemental O2 given. at 2 Liters.  Colon Prep Used Golytely for colon prep. Prep results: excellent.  Sedation Meds: Patient assessed and found to be appropriate for moderate (conscious) sedation. Fentanyl 75 mcg. given IV. Versed 8 mg. given IV.  Instrument(s): CF 140L. Serial [id number].  Findings - DIVERTICULOSIS: Descending Colon to Sigmoid Colon. Not bleeding. ICD9: Diverticulosis, Colon: 562.10.  - NORMAL EXAM: Cecum to Rectum. Not Seen: Polyps. AVM's. Colitis. Tumors. Melanosis. Crohn's. Hemorrhoids.  - POLYP: Sigmoid Colon, Maximum size: 12 mm. pedunculated polyp. Procedure:  snare with cautery, removed, retrieved, Polyp sent to pathology. ICD9: Colon Polyps: 211.3.   Assessment  Diagnoses: 211.3: Colon Polyps.   562.10: Diverticulosis, Colon.   Events  Unplanned Interventions: No intervention was required.  Plans  Post Exam Instructions: No aspirin or non-steroidal containing medications: 2 .  Medication Plan: Await pathology.  Patient Education: Patient given standard instructions for: Polyps. Diverticulosis. Patient instructed to get routine colonoscopy every 3 years.  Disposition: After procedure patient sent to recovery. After recovery patient sent home.  Scheduling/Referral: Follow-Up prn. Await pathology to schedule patient.    CC: Teresa Pelton, MD  This report was created from the original endoscopy report, which was reviewed and signed by the above listed endoscopist.    SP Surgical Pathology - STATUS: Final             By: Lunette Stands,       Perform Date: 3 Aug07 00:01  Ordered By: Gwen Pounds        Ordered Date: 6 Aug07 14:09  Facility: [facility]                               Department: [facility]   [address] [place], [place] [address]   Telephone [phone] or [phone] Fax [phone]    REPORT OF SURGICAL PATHOLOGY    Case #: [id number]   Patient Name: Susan [name], Susan T.   Office Chart Number: N/A    MRN: [id number]   Pathologist: Chrystie Nose. 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   SEE DESCRIPTION.    COMMENT  There is high grade glandular dysplasia within the polyp. The   area of dysplasia focally is associated with fibrosis and   inflammation but invasion through the muscularis mucosa is not   identified. This area does not involve the cauterized margin of   the polyp and the cauterized margin of the polyp is not involved   by adenomatous change.  (JP:jy) 06/14/06    mj   Date Reported: 06/14/2006 Chrystie Nose. Saralyn Pilar, MD   *** Electronically Signed Out By JDP ***    Clinical information   Screening (mw)    specimen(s) obtained   Colon, polyp(s), sigmoid    Gross Description   Received in formalin is a semi-firm, red brown polypoid   structure, 1.0 x 0.7 x 0.5 cm. Black ink is placed at the base   of the polyp. It is sectioned and entirely submitted. One block   (TB:jes, 06/12/06)    jes/

## 2010-12-08 NOTE — Procedures (Signed)
Summary: Colonoscopy  Patient: Susan [name] Note: All result statuses are Final unless otherwise noted.  Tests: (1) Colonoscopy (COL)   COL Colonoscopy           [place] [facility]     [place], [place]  [address]           COLONOSCOPY PROCEDURE REPORT           PATIENT:  Susan [name], Susan [name]  MR#:  [id number]     BIRTHDATE:  1946/01/12, 62 yrs. old  GENDER:  female     ENDOSCOPIST:  Loralee Pacas. Sharlett Iles, MD, [facility]     REF. BY:     PROCEDURE DATE:  11/18/2010     PROCEDURE:  Colonoscopy with snare polypectomy     ASA CLASS:  Class I     INDICATIONS:  history of polyps     MEDICATIONS:   Fentanyl 62.5 mcg IV, Versed 6 mg IV           DESCRIPTION OF PROCEDURE:   After the risks benefits and     alternatives of the procedure were thoroughly explained, informed     consent was obtained.  Digital rectal exam was performed and     revealed no abnormalities.   The LB 180AL [id number] endoscope was     introduced through the anus and advanced to the cecum, which was     identified by both the appendix and ileocecal valve, without     limitations.  The quality of the prep was excellent, using     MoviPrep.  The instrument was then slowly withdrawn as the colon     was fully examined.     <<PROCEDUREIMAGES>>           FINDINGS:  Severe diverticulosis was found in the sigmoid to     descending colon segments. SCATTERED RIGHT COLON TICS ALSO NOTED.     There were multiple polyps identified and removed. in the right     colon. FLAT RIGHT COLON 5-7 MM POLYPS HOT AND COLD SNARE EXCISED.     This was otherwise a normal examination of the colon.   Retroflexed     views in the rectum revealed no abnormalities.    The scope was     then withdrawn from the patient and the procedure completed.           COMPLICATIONS:  None     ENDOSCOPIC IMPRESSION:     1) Severe diverticulosis in the sigmoid to descending colon     segments     2) Polyps, multiple in the right  colon     3) Otherwise normal examination     RECOMMENDATIONS:     1) Await biopsy results     2) Repeat Colonoscopy in 5 years.     REPEAT EXAM:  No           ______________________________     Loralee Pacas. Sharlett Iles, MD, [facility]           CC:           n.     eSIGNED:   Loralee Pacas. Rayland Hamed at 11/18/2010 03:21 PM           Ginette Otto, [phone]  Note: An exclamation mark (!) indicates a result that was not dispersed into the flowsheet. Document Creation Date: 11/18/2010 3:21 PM _______________________________________________________________________  (1) Order result status: Final Collection or observation date-time:  11/18/2010 15:14 Requested date-time:  Receipt date-time:  Reported date-time:  Referring Physician:   Ordering Physician: Verl Blalock [phone]) Specimen Source:  Source: Tawanna Cooler Order Number: [id number] Lab site:   Appended Document: Colonoscopy     Procedures Next Due Date:    Colonoscopy: 11/2013

## 2010-12-09 ENCOUNTER — Telehealth: Payer: Self-pay | Admitting: Family Medicine

## 2010-12-09 ENCOUNTER — Ambulatory Visit: Payer: MEDICARE

## 2010-12-09 ENCOUNTER — Encounter: Payer: Self-pay | Admitting: Family Medicine

## 2010-12-09 ENCOUNTER — Ambulatory Visit: Admit: 2010-12-09 | Payer: Self-pay | Admitting: Family Medicine

## 2010-12-09 DIAGNOSIS — E538 Deficiency of other specified B group vitamins: Secondary | ICD-10-CM

## 2010-12-13 ENCOUNTER — Encounter: Payer: Self-pay | Admitting: Gastroenterology

## 2010-12-14 NOTE — Assessment & Plan Note (Signed)
Summary: NURSE VISIT   Nurse Visit   Allergies: 1)  ! Pcn 2)  ! Sulfa  Medication Administration  Injection # 1:    Medication: Vit B12 1000 mcg    Diagnosis: VITAMIN B12 DEFICIENCY (ICD-266.2)    Route: IM    Site: R deltoid    Exp Date: 05/06/2012    Lot #: [id number]    Mfr: [place]    Patient tolerated injection without complications    Given by: Edwin Dada CMA ([place]) (December 09, 2010 2:25 PM)  Orders Added: 1)  Vit B12 1000 mcg [J3420] 2)  Admin of Therapeutic Inj  intramuscular or subcutaneous [96372]   Medication Administration  Injection # 1:    Medication: Vit B12 1000 mcg    Diagnosis: VITAMIN B12 DEFICIENCY (ICD-266.2)    Route: IM    Site: R deltoid    Exp Date: 05/06/2012    Lot #: [id number]    Mfr: [place]    Patient tolerated injection without complications    Given by: Edwin Dada CMA ([place]) (December 09, 2010 2:25 PM)  Orders Added: 1)  Vit B12 1000 mcg [J3420] 2)  Admin of Therapeutic Inj  intramuscular or subcutaneous [09643]

## 2010-12-15 ENCOUNTER — Telehealth: Payer: Self-pay | Admitting: Gastroenterology

## 2010-12-22 NOTE — Progress Notes (Signed)
Summary: B-12 nasal spray  Phone Note Call from Patient Call back at Home Phone [phone] P PH     Caller: Patient Call For: Elsie Stain MD Summary of Call: Patient came in today to get B-12 injection and was asking about the B-12 nasal spray? pt would like a rx for that. Please advise. Initial call taken by: Edwin Dada CMA Deborra Medina),  December 09, 2010 2:27 PM  Follow-up for Phone Call        this should come through Dr. Buel Ream office with GI.  See note from 11/21/10. Follow-up by: Elsie Stain MD,  December 09, 2010 3:21 PM  Additional Follow-up for Phone Call Additional follow up Details #1::        ok... Additional Follow-up by: Sable Feil MD [facility],  December 12, 2010 9:15 AM     Appended Document: B-12 nasal spray rx sent

## 2010-12-22 NOTE — Miscellaneous (Signed)
Summary: Nascobal RX  Clinical Lists Changes  Medications: Changed medication from CYANOCOBALAMIN 1000 MCG/ML SOLN (CYANOCOBALAMIN) inject one ML IM once a week for three weeks, then inject one ML IM once a month or rx nascobal. to NASCOBAL 500 MCG/0.1ML SOLN (CYANOCOBALAMIN) one spray, one nostril, once a week. - Signed Rx of NASCOBAL 500 MCG/0.1ML SOLN (CYANOCOBALAMIN) one spray, one nostril, once a week.;  #1 bottle x 6;  Signed;  Entered by: Bernita Buffy CMA (AAMA);  Authorized by: Sable Feil MD [facility];  Method used: Electronically to [facility] [phone].*, [address], [place], [place], [place]  [address], Ph: [phone] or [phone], Fax: [phone]  advised a female that the rx for nascobal is sent.  Prescriptions: NASCOBAL 500 MCG/0.1ML SOLN (CYANOCOBALAMIN) one spray, one nostril, once a week.  #1 bottle x 6   Entered by:   Bernita Buffy CMA ([place])   Authorized by:   Sable Feil MD [facility]   Signed by:   Bernita Buffy CMA ([place]) on 12/13/2010   Method used:   Electronically to        [facility] [phone].* (retail)       [address]       [place], [place]  [address]       Ph: [phone] or [phone]       Fax: [phone]   RxID:   [id number]

## 2010-12-22 NOTE — Progress Notes (Signed)
Summary: med is too expensive   Phone Note Call from Patient Call back at Work Phone [phone]   Caller: Patient Call For: Dr Sharlett Iles Reason for Call: Talk to Nurse Summary of Call: Patient wants to know if theres anyhthing cheaper NASCOBAL 500 MCG/0.1ML states that this med is $600 for two months supply, can't afford it. Initial call taken by: Ronalee Red,  December 15, 2010 4:07 PM  Follow-up for Phone Call        pt can not afford nascobal so she would like to get the b12 injections monthly and she will get those at Dr. Carole Civil office she will need one 01/07/2011. Follow-up by: Bernita Buffy CMA ([place]),  December 15, 2010 4:15 PM    New/Updated Medications: CYANOCOBALAMIN 1000 MCG/ML SOLN (CYANOCOBALAMIN) inject one ML IM once month, to be done at Dr. Carole Civil office

## 2011-01-06 ENCOUNTER — Encounter: Payer: Self-pay | Admitting: Family Medicine

## 2011-01-06 ENCOUNTER — Ambulatory Visit (INDEPENDENT_AMBULATORY_CARE_PROVIDER_SITE_OTHER): Payer: Medicare Other

## 2011-01-06 DIAGNOSIS — E538 Deficiency of other specified B group vitamins: Secondary | ICD-10-CM

## 2011-01-12 NOTE — Assessment & Plan Note (Signed)
Summary: b-12 inj/cle   Nurse Visit   Allergies: 1)  ! Pcn 2)  ! Sulfa  Medication Administration  Injection # 1:    Medication: Vit B12 1000 mcg    Diagnosis: VITAMIN B12 DEFICIENCY (ICD-266.2)    Route: IM    Site: L deltoid    Exp Date: 10/05/2012    Lot #: [id number]    Mfr: [place]    Patient tolerated injection without complications    Given by: Christena Deem CMA Deborra Medina) (January 06, 2011 2:13 PM)  Orders Added: 1)  Admin of Therapeutic Inj  intramuscular or subcutaneous [96372] 2)  Vit B12 1000 mcg [J3420]   Medication Administration  Injection # 1:    Medication: Vit B12 1000 mcg    Diagnosis: VITAMIN B12 DEFICIENCY (ICD-266.2)    Route: IM    Site: L deltoid    Exp Date: 10/05/2012    Lot #: [id number]    Mfr: [place]    Patient tolerated injection without complications    Given by: Christena Deem CMA ([place]) (January 06, 2011 2:13 PM)  Orders Added: 1)  Admin of Therapeutic Inj  intramuscular or subcutaneous [96372] 2)  Vit B12 1000 mcg [R1735]

## 2011-02-09 ENCOUNTER — Ambulatory Visit (INDEPENDENT_AMBULATORY_CARE_PROVIDER_SITE_OTHER): Payer: Medicare Other | Admitting: Family Medicine

## 2011-02-09 DIAGNOSIS — E538 Deficiency of other specified B group vitamins: Secondary | ICD-10-CM

## 2011-02-09 MED ORDER — CYANOCOBALAMIN 1000 MCG/ML IJ SOLN
1000.0000 ug | Freq: Once | INTRAMUSCULAR | Status: AC
  Administered 2011-02-09: 1000 ug via INTRAMUSCULAR

## 2011-02-10 NOTE — Progress Notes (Signed)
Came in for inj.

## 2011-03-07 ENCOUNTER — Ambulatory Visit: Payer: Self-pay

## 2011-03-08 ENCOUNTER — Ambulatory Visit: Payer: Self-pay

## 2011-03-10 ENCOUNTER — Ambulatory Visit: Payer: MEDICARE

## 2011-03-10 ENCOUNTER — Ambulatory Visit (INDEPENDENT_AMBULATORY_CARE_PROVIDER_SITE_OTHER): Payer: Medicare Other | Admitting: Family Medicine

## 2011-03-10 DIAGNOSIS — E538 Deficiency of other specified B group vitamins: Secondary | ICD-10-CM

## 2011-03-10 MED ORDER — CYANOCOBALAMIN 1000 MCG/ML IJ SOLN
1000.0000 ug | Freq: Once | INTRAMUSCULAR | Status: AC
  Administered 2011-03-10: 1000 ug via INTRAMUSCULAR

## 2011-03-12 NOTE — Progress Notes (Signed)
  Subjective:    Patient ID: Susan [name], female    DOB: 02-03-1946, 65 y.o.   MRN: [id number]  HPI    Review of Systems     Objective:   Physical Exam        Assessment & Plan:  rn visit

## 2011-03-24 NOTE — Procedures (Signed)
[place]   NAME:Susan [name], TERRELL SHIMKO                   MRN:          [id number]  DATE:05/11/2006                            DOB:          1946/06/05    REFERRING PHYSICIAN:  DAVID R PATTERSON   ORDERING PHYSICIAN:  Loralee Pacas. Sharlett Iles, MD, [place], FACP   READING PHYSICIAN:  Pricilla Riffle. Fuller Plan, MD, [facility], FACP   PROCEDURE:  Multiplanar abdominal ultrasound imaging was performed in the  upright, supine, right and left lateral decubitus positions.   RESULTS:  The abdominal aorta is 1.4 cm by 1.5 cm.  The abdominal aorta is  normal.  The IVC is patent.   The pancreas appears normal throughout the head, body and tail without  evidence of ductal dilatation, pancreatic masses, or peripancreatic  inflammation.  In the gallbladder, there is a single 1-cm stone with motion  and shadowing.  The remainder of the gallbladder exam is normal.   The common bile duct measures 3.1 mm in maximal diameter without evidence of  intraluminal foci.   The liver reveals a slightly uneven echotexture.  There is no evidence of  parenchymal lesion, ductal dilatation or vascular abnormality.   The right kidney measures 10.6 cm, the left kidney 9.1 cm.  Both kidneys are  normal in appearance.   Spleen is normal in size, measuring 10.3 cm without parenchymal lesion.   IMPRESSION:  Cholelithiasis.   RECOMMENDATIONS:  Clinical correlation and plans per Dr. Loralee Pacas. Sharlett Iles.                                   Pricilla Riffle. Dagoberto Ligas., MD, [facility], FACP   MTS/MedQ  DD:  05/15/2006  DT:  05/15/2006  Job #:  [id number]   cc:   Loralee Pacas. Sharlett Iles, MD, [facility], [place]

## 2011-03-24 NOTE — Assessment & Plan Note (Signed)
[facility] OFFICE NOTE   NAME:Janczak, Susan [name]                   MRN:          [id number]  DATE:06/01/2006                            DOB:          May 20, 1946    Susan [name] describes gnawing epigastric discomfort and does take a lot of  aspirin products.  She really denies other GI complaints.   Upper abdominal ultrasound exam did show a solitary gallstone but no  evidence of biliary ductal dilatation and the liver was normal except for  some uneven echo texture.  She continues to have abnormal liver function  tests with an SGOT of 51 and SGPT of 38 and alkaline phosphatase of 37.  Her  multiple serologic panel was negative except for weakly positive  antimitochondrial antibody of 1.4, normal greater than 1.3.  Other multiple  lab tests were negative.   PHYSICAL EXAMINATION:  VITAL SIGNS:  Continues to show normal vital signs  with a weight of 165 pounds, blood pressure 130/78.  ABDOMEN:  Her liver is slightly enlarged in the right upper quadrant.  This  is slightly tender.  The abdominal exam otherwise is unremarkable.   ASSESSMENT:  1.  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  2.  Probable NSAID induced gastritis.  3.  Element of irritable bowel syndrome.   RECOMMENDATIONS:  1.  Trial of Nexium 40 mg q.a.m.  2.  Colonoscopy screen as previously scheduled for June 08, 2006.  3.  Outpatient liver biopsy once colonoscopy completed.                                   Loralee Pacas. Sharlett Iles, MD, [facility]val Regal, [place]   DRP/MedQ  DD:  06/01/2006  DT:  06/01/2006  Job #:  [id number]   cc:   [place]esto Charon, MD  Vedia Pereyra. Bean, FNP

## 2011-03-24 NOTE — Assessment & Plan Note (Signed)
[facility] OFFICE NOTE   NAME:[name], Susan ZELLER                   MRN:          [id number]  DATE:09/07/2006                            DOB:          1946-01-18    Susan [name] is a 65 year old white female that has NASH syndrome with stage 2  fibrosis. She currently is on a reducing diet and it has normalized her  liver function tests per lab data from August 31, 2006. Also her  cholesterol is down to 151 and triglycerides 68.   Her weight today is 153.8 pounds which is down another 11 pounds since July  27. Blood pressure is 120/60 and pulse was 78 and regular. Her abdominal  exam was unremarkable without hepatosplenomegaly, masses or tenderness.   RECOMMENDATIONS:  1. Continue exercise and dietary program.  2. Other medications per Dr. Council Mechanic.  3. GI followup with liver profile in 3 months.  4. Suggest the patient see Dr. Gladstone Lighter for plantar fasciitis.     Loralee Pacas. Sharlett Iles, MD, Quentin Ore, [place]  Electronically Signed    DRP/MedQ  DD: 09/07/2006  DT: 09/07/2006  Job #: [id number]   cc:   Modesto Charon, MD

## 2011-03-24 NOTE — Assessment & Plan Note (Signed)
[facility] OFFICE NOTE   NAME:[name], Susan WEINHEIMER                   MRN:          [id number]  DATE:07/20/2006                            DOB:          [birth year]    Jullie's liver biopsy was consistent with steatohepatitis and early  fibrosis.  There were no changes of primary biliary cirrhosis.  She remains  asymptomatic.  I have referred her to [facility]  outpatient  dietary therapy for weight loss and will see her back in two months' time  and repeat her liver and lipid profiles at that time.  If she continues to  have elevated liver enzymes, she will probably be a candidate for low dose  metformin therapy.                                   Loralee Pacas. Sharlett Iles, MD, Mar[place] Regal, [place]   DRP/MedQ  DD:  07/20/2006  DT:  07/21/2006  Job #:  [id number]

## 2011-04-21 ENCOUNTER — Ambulatory Visit (INDEPENDENT_AMBULATORY_CARE_PROVIDER_SITE_OTHER): Payer: Medicare Other | Admitting: Family Medicine

## 2011-04-21 DIAGNOSIS — E538 Deficiency of other specified B group vitamins: Secondary | ICD-10-CM

## 2011-04-21 MED ORDER — CYANOCOBALAMIN 1000 MCG/ML IJ SOLN
1000.0000 ug | Freq: Once | INTRAMUSCULAR | Status: AC
  Administered 2011-04-21: 1000 ug via INTRAMUSCULAR

## 2011-04-21 NOTE — Progress Notes (Signed)
  Subjective:    Patient ID: Susan [name], female    DOB: 1946-06-05, 65 y.o.   MRN: [id number]  HPI    Review of Systems     Objective:   Physical Exam        Assessment & Plan:  rn visit

## 2011-05-05 ENCOUNTER — Ambulatory Visit (INDEPENDENT_AMBULATORY_CARE_PROVIDER_SITE_OTHER): Payer: Medicare Other | Admitting: Family Medicine

## 2011-05-05 ENCOUNTER — Encounter: Payer: Self-pay | Admitting: Family Medicine

## 2011-05-05 DIAGNOSIS — D179 Benign lipomatous neoplasm, unspecified: Secondary | ICD-10-CM | POA: Insufficient documentation

## 2011-05-05 DIAGNOSIS — F509 Eating disorder, unspecified: Secondary | ICD-10-CM | POA: Insufficient documentation

## 2011-05-05 NOTE — Progress Notes (Signed)
Lump on back and on R thigh.  She wanted these evaluated.  Mildly ttp but o/w w/o sx.   Fatigue continues, worse after sugary foods.  Occ nausea and dizzy w/o vomiting- can happen early in AM, episodic, irregular pattern, lasts a few minutes then self resolves.  H/o nut/seed and iceberg lettuce intolerance.  Prev liver biopsy was consistent with fatty liver,NASH syndrome and early fibrosis.  We talked about her relationship with food.  She calls it an addiction.  She is aware of feelings of control, guilt, pleasure with food.    Meds, vitals, and allergies reviewed.   ROS: See HPI.  Otherwise, noncontributory.  nad Tearful initially talking about diet/food/control but regains composure.  2 skin lesions, both feel like lipomas.  1 on upper back and 1 on L thigh.  No erythema.   rrr ctab abd soft, not ttp Ext well perfused.    Check on insurance coverage for counseling and a name for the patient.

## 2011-05-05 NOTE — Assessment & Plan Note (Addendum)
>  25 min spent with face to face with patient, >50% counseling.   She's okay for outpatient fu, no SI/HI.  I'll check into counseling and then send word to the patient.  I don't this 'just starting a diet' is going to change her fundamental symptoms/issues, though I think the fatigue and I sx are diet/weight/NASH related.  She agrees.

## 2011-05-05 NOTE — Patient Instructions (Signed)
I wouldn't do anything about the lipomas unless they really get painful.  Call me if that's the case.  I'll check on the counseling.  Take care.

## 2011-05-07 NOTE — Assessment & Plan Note (Signed)
If these get big and very symptomatic, she can call back and we'll set her up with surgery for removal.  Reassures o/w.  Mult relatives per her report had similar.

## 2011-05-09 ENCOUNTER — Telehealth: Payer: Self-pay | Admitting: Family Medicine

## 2011-05-09 NOTE — Telephone Encounter (Signed)
Please call pt and have her call [phone] to talk with Alvis Lemmings about getting an appointment.  She can help her with food concerns.  We talked about it at the visit and she should be expecting a call.  Thanks.

## 2011-05-09 NOTE — Telephone Encounter (Signed)
Patient notified as instructed by telephone.

## 2011-05-31 ENCOUNTER — Telehealth: Payer: Self-pay | Admitting: Gastroenterology

## 2011-05-31 NOTE — Telephone Encounter (Signed)
Informed ot she had her 1st B12 inj in 11/2010; she will need a repeat lab next year. Put in a reminder; I will call her.

## 2011-06-02 ENCOUNTER — Ambulatory Visit (INDEPENDENT_AMBULATORY_CARE_PROVIDER_SITE_OTHER): Payer: Medicare Other | Admitting: Family Medicine

## 2011-06-02 DIAGNOSIS — E538 Deficiency of other specified B group vitamins: Secondary | ICD-10-CM

## 2011-06-02 MED ORDER — CYANOCOBALAMIN 1000 MCG/ML IJ SOLN
1000.0000 ug | Freq: Once | INTRAMUSCULAR | Status: AC
  Administered 2011-06-02: 1000 ug via INTRAMUSCULAR

## 2011-06-02 NOTE — Progress Notes (Signed)
B12 injection given during nurse visit today.  No charge.

## 2011-06-30 ENCOUNTER — Encounter: Payer: Self-pay | Admitting: Family Medicine

## 2011-06-30 ENCOUNTER — Ambulatory Visit (INDEPENDENT_AMBULATORY_CARE_PROVIDER_SITE_OTHER): Payer: Medicare Other | Admitting: Family Medicine

## 2011-06-30 VITALS — BP 130/72 | HR 90 | Temp 98.1°F | Wt 166.1 lb

## 2011-06-30 DIAGNOSIS — R300 Dysuria: Secondary | ICD-10-CM

## 2011-06-30 DIAGNOSIS — F509 Eating disorder, unspecified: Secondary | ICD-10-CM

## 2011-06-30 DIAGNOSIS — N390 Urinary tract infection, site not specified: Secondary | ICD-10-CM | POA: Insufficient documentation

## 2011-06-30 LAB — POCT URINALYSIS DIPSTICK
Blood, UA: NEGATIVE
Nitrite, UA: NEGATIVE
Protein, UA: NEGATIVE
pH, UA: 6

## 2011-06-30 MED ORDER — CIPROFLOXACIN HCL 250 MG PO TABS: 250.0000 mg | ORAL_TABLET | Freq: Two times a day (BID) | ORAL | Status: DC

## 2011-06-30 NOTE — Assessment & Plan Note (Signed)
Cipro, ucx, fluids and f/u prn.

## 2011-06-30 NOTE — Progress Notes (Signed)
Dysuria: "I got a UTI." no burning but had frequency, pressure.   duration of symptoms: ~1 week abdominal pain:no fevers:no back pain: yes lower back pain vomiting:no  Meds, vitals, and allergies reviewed.   ROS: See HPI.  Otherwise negative.    GEN: nad, alert and oriented HEENT: mucous membranes moist NECK: supple CV: rrr.  PULM: ctab, no inc wob ABD: soft, +bs, suprapubic area not tender BACK: no CVA pain

## 2011-06-30 NOTE — Assessment & Plan Note (Signed)
She has outpatient consult planned and will notify the clinic here if she needs a referral.  I thanked her for her effort.

## 2011-06-30 NOTE — Patient Instructions (Signed)
Drink plenty of water and start the cipro today.  We'll contact you with your lab report. Take care.

## 2011-07-06 ENCOUNTER — Ambulatory Visit (INDEPENDENT_AMBULATORY_CARE_PROVIDER_SITE_OTHER): Payer: Medicare Other | Admitting: Family Medicine

## 2011-07-06 ENCOUNTER — Encounter: Payer: Self-pay | Admitting: Family Medicine

## 2011-07-06 ENCOUNTER — Ambulatory Visit (INDEPENDENT_AMBULATORY_CARE_PROVIDER_SITE_OTHER): Payer: Medicare Other | Admitting: *Deleted

## 2011-07-06 VITALS — BP 150/92 | HR 84 | Temp 98.3°F | Wt 159.0 lb

## 2011-07-06 DIAGNOSIS — R1032 Left lower quadrant pain: Secondary | ICD-10-CM

## 2011-07-06 DIAGNOSIS — E538 Deficiency of other specified B group vitamins: Secondary | ICD-10-CM

## 2011-07-06 MED ORDER — METRONIDAZOLE 500 MG PO TABS: 500.0000 mg | ORAL_TABLET | Freq: Three times a day (TID) | ORAL | Status: AC

## 2011-07-06 MED ORDER — CIPROFLOXACIN HCL 500 MG PO TABS: 500.0000 mg | ORAL_TABLET | Freq: Two times a day (BID) | ORAL | Status: AC

## 2011-07-06 MED ORDER — CYANOCOBALAMIN 1000 MCG/ML IJ SOLN
1000.0000 ug | Freq: Once | INTRAMUSCULAR | Status: AC
  Administered 2011-07-06: 1000 ug via INTRAMUSCULAR

## 2011-07-06 NOTE — Assessment & Plan Note (Addendum)
Likely diverticulitis.  D/w pt BJ:[id number] and options.  H/o diverticulosis and given the exam, I would proceed with treatment.  Checking CBC would likely not change mgmt and there doesn't appear to be an indication/need image now.  If not improved, she can call back or if profoundly worse, then she'll go to ER.  She agrees with plan.  I asked her to call back with update tomorrow.  >25 min spent with face to face with patient, >50% counseling.

## 2011-07-06 NOTE — Patient Instructions (Signed)
Start the antibiotics today and call me with an update tomorrow.  If you get significantly more pain, then go to the ER.  Take care.

## 2011-07-06 NOTE — Progress Notes (Signed)
Just finished cipro for UTI.  She was getting better and the urine odor resolved.  She felt better.    Yesterday with new L sided abd pain. Episodic.  Today the pain is dull and constant, with occ sharp pain.  She had an odd taste in her mouth recently.  She had suprapubic pressure with the UTI that resolved, but the L sided pain now is different.  No dysuria now.  No changes in stool, no diarrhea, no blood in stool.  H/o diverticulosis on colonoscopy prev.  Feels hot but no fevers.    PMH and SH reviewed  ROS: See HPI, otherwise noncontributory.  Meds, vitals, and allergies reviewed.   nad but uncomfortable Mmm rrr ctab abd soft, +BS, LLQ ttp w/o rebound Back w/o CVA pain Ext well perfused.

## 2011-07-07 ENCOUNTER — Ambulatory Visit: Payer: Medicare Other

## 2011-07-07 ENCOUNTER — Telehealth: Payer: Self-pay | Admitting: Family Medicine

## 2011-07-07 NOTE — Telephone Encounter (Signed)
I called and LMOVM for pt x2.  I will await update from patient.

## 2011-07-11 ENCOUNTER — Telehealth: Payer: Self-pay | Admitting: *Deleted

## 2011-07-11 NOTE — Telephone Encounter (Signed)
Noted. Thanks.

## 2011-07-11 NOTE — Telephone Encounter (Signed)
Patient called to let you know that she felt a 100% better Friday. Patient stated that she started her weight loss program and is doing well with that.

## 2011-08-09 ENCOUNTER — Telehealth: Payer: Self-pay | Admitting: *Deleted

## 2011-08-09 NOTE — Telephone Encounter (Signed)
Pt has been going to [facility] in [place] and she needs a letter for her insurance stating that it's medically necessary for her to lose weight.  She sees Dr. Derald Macleod there. She is coming in for nurse visit on Friday and wants to pick the letter up then.

## 2011-08-09 NOTE — Telephone Encounter (Signed)
I have pended the letter.  Please print and give to pt. Thanks.

## 2011-08-10 ENCOUNTER — Encounter: Payer: Self-pay | Admitting: *Deleted

## 2011-08-10 NOTE — Telephone Encounter (Signed)
Letter printed for signature and placed at front desk for pick up.

## 2011-08-11 ENCOUNTER — Ambulatory Visit (INDEPENDENT_AMBULATORY_CARE_PROVIDER_SITE_OTHER): Payer: Medicare Other | Admitting: *Deleted

## 2011-08-11 DIAGNOSIS — E538 Deficiency of other specified B group vitamins: Secondary | ICD-10-CM

## 2011-08-11 MED ORDER — CYANOCOBALAMIN 1000 MCG/ML IJ SOLN
1000.0000 ug | Freq: Once | INTRAMUSCULAR | Status: AC
  Administered 2011-08-11: 1000 ug via INTRAMUSCULAR

## 2011-09-15 ENCOUNTER — Telehealth: Payer: Self-pay | Admitting: Gastroenterology

## 2011-09-15 ENCOUNTER — Ambulatory Visit (INDEPENDENT_AMBULATORY_CARE_PROVIDER_SITE_OTHER): Payer: Medicare Other | Admitting: *Deleted

## 2011-09-15 DIAGNOSIS — E538 Deficiency of other specified B group vitamins: Secondary | ICD-10-CM

## 2011-09-15 MED ORDER — CYANOCOBALAMIN 1000 MCG/ML IJ SOLN
1000.0000 ug | Freq: Once | INTRAMUSCULAR | Status: AC
  Administered 2011-09-15: 1000 ug via INTRAMUSCULAR

## 2011-09-15 NOTE — Telephone Encounter (Signed)
lmom for pt to call back

## 2011-09-15 NOTE — Progress Notes (Signed)
B12 injection given during nurse visit today.

## 2011-09-18 NOTE — Telephone Encounter (Signed)
Spoke with pt this am and correct number to fax info to is [phone].

## 2011-10-13 ENCOUNTER — Telehealth: Payer: Self-pay | Admitting: Family Medicine

## 2011-10-13 ENCOUNTER — Ambulatory Visit: Payer: Medicare Other

## 2011-10-13 ENCOUNTER — Ambulatory Visit (INDEPENDENT_AMBULATORY_CARE_PROVIDER_SITE_OTHER): Payer: Medicare Other | Admitting: Internal Medicine

## 2011-10-13 DIAGNOSIS — E538 Deficiency of other specified B group vitamins: Secondary | ICD-10-CM

## 2011-10-13 MED ORDER — CYANOCOBALAMIN 1000 MCG/ML IJ SOLN
1000.0000 ug | Freq: Once | INTRAMUSCULAR | Status: AC
  Administered 2011-10-13: 1000 ug via INTRAMUSCULAR

## 2011-10-13 NOTE — Telephone Encounter (Signed)
[phone] Pt wanted to know if she can come here to get her b12 shots.  She is a patient of dr Carole Civil at [facility].  Pt stated that she was going to stay with dr Damita Dunnings and a primary care.

## 2011-11-07 HISTORY — PX: CATARACT EXTRACTION: SUR2

## 2011-11-15 ENCOUNTER — Telehealth: Payer: Self-pay | Admitting: Gastroenterology

## 2011-11-16 NOTE — Telephone Encounter (Signed)
Pt reports she's trying to get her insurance company to pay for her weight loss efforts. She would like a letter with the dx and dx code. I faxed her office notes 09/2011 with the information, but she never called about not receiving them. I will check with her tomorrow.

## 2011-11-17 ENCOUNTER — Ambulatory Visit (INDEPENDENT_AMBULATORY_CARE_PROVIDER_SITE_OTHER): Payer: Medicare Other | Admitting: *Deleted

## 2011-11-17 DIAGNOSIS — E538 Deficiency of other specified B group vitamins: Secondary | ICD-10-CM

## 2011-11-17 MED ORDER — CYANOCOBALAMIN 1000 MCG/ML IJ SOLN
1000.0000 ug | Freq: Once | INTRAMUSCULAR | Status: AC
  Administered 2011-11-17: 1000 ug via INTRAMUSCULAR

## 2011-11-17 NOTE — Telephone Encounter (Signed)
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 Faxed to [phone]

## 2011-12-22 ENCOUNTER — Ambulatory Visit: Payer: Medicare Other

## 2011-12-29 ENCOUNTER — Ambulatory Visit (INDEPENDENT_AMBULATORY_CARE_PROVIDER_SITE_OTHER): Payer: Medicare Other | Admitting: *Deleted

## 2011-12-29 DIAGNOSIS — E538 Deficiency of other specified B group vitamins: Secondary | ICD-10-CM

## 2011-12-29 MED ORDER — CYANOCOBALAMIN 1000 MCG/ML IJ SOLN
1000.0000 ug | Freq: Once | INTRAMUSCULAR | Status: AC
  Administered 2011-12-29: 1000 ug via INTRAMUSCULAR

## 2012-01-12 ENCOUNTER — Ambulatory Visit (INDEPENDENT_AMBULATORY_CARE_PROVIDER_SITE_OTHER): Payer: Medicare Other | Admitting: Family Medicine

## 2012-01-12 ENCOUNTER — Encounter: Payer: Self-pay | Admitting: Family Medicine

## 2012-01-12 DIAGNOSIS — R7309 Other abnormal glucose: Secondary | ICD-10-CM

## 2012-01-12 DIAGNOSIS — I10 Essential (primary) hypertension: Secondary | ICD-10-CM

## 2012-01-12 DIAGNOSIS — E538 Deficiency of other specified B group vitamins: Secondary | ICD-10-CM

## 2012-01-12 DIAGNOSIS — K760 Fatty (change of) liver, not elsewhere classified: Secondary | ICD-10-CM

## 2012-01-12 DIAGNOSIS — Z862 Personal history of diseases of the blood and blood-forming organs and certain disorders involving the immune mechanism: Secondary | ICD-10-CM

## 2012-01-12 LAB — COMPREHENSIVE METABOLIC PANEL
AST: 28 U/L (ref 0–37)
Albumin: 4.3 g/dL (ref 3.5–5.2)
Alkaline Phosphatase: 54 U/L (ref 39–117)
BUN: 13 mg/dL (ref 6–23)
Potassium: 4 mEq/L (ref 3.5–5.3)
Sodium: 141 mEq/L (ref 135–145)
Total Bilirubin: 0.6 mg/dL (ref 0.3–1.2)

## 2012-01-12 LAB — LIPID PANEL
HDL: 57 mg/dL (ref >39)
Total CHOL/HDL Ratio: 3.2 Ratio
VLDL: 15 mg/dL (ref 0–40)

## 2012-01-12 LAB — VITAMIN B12: Vitamin B-12: 578 pg/mL (ref 211–911)

## 2012-01-12 NOTE — Patient Instructions (Signed)
Take care.  Keep working on your weight.  Let me know if you have concerns.  I would take mucinex for the cold.   You can get your results through our phone system.  Follow the instructions on the blue card.

## 2012-01-12 NOTE — Progress Notes (Signed)
H/o fatty liver.  Sig lifestyle change.  Down 25+ lbs.  Followed at weight loss center.  She feels better, more confident and feels well.  She's still working on the lifestyle changes.  She had changed her wardrobe out of necessity.  "I am a (food) addict."  She has family support.    She has mild URI sx today.  Her family is well.   H/o B12 def, due for labs.    Meds, vitals, and allergies reviewed.   ROS: See HPI.  Otherwise, noncontributory.  GEN: nad, alert and oriented, thinner appearing HEENT: mucous membranes moist, tm w/o erythema, nasal exam w/o erythema, clear discharge noted,  OP with cobblestoning NECK: supple w/o LA CV: rrr.   PULM: ctab, no inc wob EXT: no edema SKIN: no acute rash

## 2012-01-15 ENCOUNTER — Encounter: Payer: Self-pay | Admitting: Family Medicine

## 2012-01-15 NOTE — Assessment & Plan Note (Signed)
Normal level on labs.

## 2012-01-16 ENCOUNTER — Telehealth: Payer: Self-pay | Admitting: Family Medicine

## 2012-01-16 MED ORDER — AZITHROMYCIN 250 MG PO TABS: ORAL_TABLET | ORAL | Status: AC

## 2012-01-16 NOTE — Telephone Encounter (Signed)
Patient was seen last Friday for a cold.  Patient said she's feeling worse.  She didn't go to work today.  She's has a deep,productive cough,headache,head congestion,no fever and she feels bad. Patient is taking Muccinex.  Patient wants to know if something can be called in to her pharmacy.  Patient uses [facility] on [address].

## 2012-01-16 NOTE — Telephone Encounter (Signed)
Progressive cough with sputum.  Not SOB.  On mucinex.  No fevers.  1 week of progressive sx.  We talked about options.  Given the duration and progression, would start zmax and f/u prn.  Continue supportive measures, rest/mucinex.  She agrees.

## 2012-01-19 ENCOUNTER — Ambulatory Visit: Payer: Medicare Other

## 2012-01-25 ENCOUNTER — Telehealth: Payer: Self-pay | Admitting: Gastroenterology

## 2012-01-25 NOTE — Telephone Encounter (Signed)
Informed pt that Dr Sharlett Iles says if a pt is B12 deficient and the levels rise with injections, they will probably be on the injections for a lifetime. She can stop the injections and see if her levels fall, but she stated she would remain taking the injections. She has lost >30 lbs and is exercising and feels great.

## 2012-02-09 ENCOUNTER — Ambulatory Visit: Payer: Medicare Other

## 2012-02-16 ENCOUNTER — Ambulatory Visit (INDEPENDENT_AMBULATORY_CARE_PROVIDER_SITE_OTHER): Payer: Medicare Other

## 2012-02-16 DIAGNOSIS — E538 Deficiency of other specified B group vitamins: Secondary | ICD-10-CM

## 2012-02-16 MED ORDER — CYANOCOBALAMIN 1000 MCG/ML IJ SOLN
1000.0000 ug | Freq: Once | INTRAMUSCULAR | Status: AC
  Administered 2012-02-16: 1000 ug via INTRAMUSCULAR

## 2012-02-23 ENCOUNTER — Ambulatory Visit: Payer: Medicare Other

## 2012-03-22 ENCOUNTER — Ambulatory Visit (INDEPENDENT_AMBULATORY_CARE_PROVIDER_SITE_OTHER): Payer: Medicare Other | Admitting: *Deleted

## 2012-03-22 DIAGNOSIS — E538 Deficiency of other specified B group vitamins: Secondary | ICD-10-CM

## 2012-03-22 MED ORDER — CYANOCOBALAMIN 1000 MCG/ML IJ SOLN
1000.0000 ug | Freq: Once | INTRAMUSCULAR | Status: AC
  Administered 2012-03-22: 1000 ug via INTRAMUSCULAR

## 2012-03-29 ENCOUNTER — Ambulatory Visit: Payer: Medicare Other

## 2012-05-03 ENCOUNTER — Ambulatory Visit: Payer: Medicare Other

## 2012-05-07 ENCOUNTER — Encounter: Payer: Self-pay | Admitting: Family Medicine

## 2012-05-07 ENCOUNTER — Ambulatory Visit (INDEPENDENT_AMBULATORY_CARE_PROVIDER_SITE_OTHER): Payer: Medicare Other | Admitting: Family Medicine

## 2012-05-07 VITALS — BP 108/68 | HR 78 | Temp 98.2°F | Wt 140.0 lb

## 2012-05-07 DIAGNOSIS — G43909 Migraine, unspecified, not intractable, without status migrainosus: Secondary | ICD-10-CM

## 2012-05-07 DIAGNOSIS — E538 Deficiency of other specified B group vitamins: Secondary | ICD-10-CM

## 2012-05-07 DIAGNOSIS — G43109 Migraine with aura, not intractable, without status migrainosus: Secondary | ICD-10-CM

## 2012-05-07 MED ORDER — CYANOCOBALAMIN 1000 MCG/ML IJ SOLN
1000.0000 ug | Freq: Once | INTRAMUSCULAR | Status: AC
  Administered 2012-05-07: 1000 ug via INTRAMUSCULAR

## 2012-05-07 NOTE — Assessment & Plan Note (Signed)
With h/o ocular migraine, refer for carotid u/s.  Will notify pt.  She agrees.

## 2012-05-07 NOTE — Progress Notes (Signed)
H/o mult ocular migraines and it was suggest by the eye clinic about getting carotid eval.  We discussed.  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  No h/o TIA, no h/o CVA.    Meds, vitals, and allergies reviewed.   ROS: See HPI.  Otherwise, noncontributory.  nad ncat Neck supple, no LA Normal carotid pulse B, no bruit rrr ctab

## 2012-05-07 NOTE — Patient Instructions (Addendum)
See Roselyn Reef about your referral before you leave today.  We'll be in touch.  Take care.

## 2012-05-17 ENCOUNTER — Encounter (INDEPENDENT_AMBULATORY_CARE_PROVIDER_SITE_OTHER): Payer: Medicare Other

## 2012-05-17 DIAGNOSIS — G43109 Migraine with aura, not intractable, without status migrainosus: Secondary | ICD-10-CM

## 2012-05-17 DIAGNOSIS — I6529 Occlusion and stenosis of unspecified carotid artery: Secondary | ICD-10-CM

## 2012-05-31 ENCOUNTER — Ambulatory Visit: Payer: Medicare Other

## 2012-06-07 ENCOUNTER — Ambulatory Visit: Payer: Medicare Other

## 2012-06-14 ENCOUNTER — Ambulatory Visit (INDEPENDENT_AMBULATORY_CARE_PROVIDER_SITE_OTHER): Payer: Medicare Other | Admitting: *Deleted

## 2012-06-14 DIAGNOSIS — E538 Deficiency of other specified B group vitamins: Secondary | ICD-10-CM

## 2012-06-14 MED ORDER — CYANOCOBALAMIN 1000 MCG/ML IJ SOLN
1000.0000 ug | Freq: Once | INTRAMUSCULAR | Status: AC
  Administered 2012-06-14: 1000 ug via INTRAMUSCULAR

## 2012-07-19 ENCOUNTER — Ambulatory Visit (INDEPENDENT_AMBULATORY_CARE_PROVIDER_SITE_OTHER): Payer: Medicare Other | Admitting: *Deleted

## 2012-07-19 ENCOUNTER — Ambulatory Visit: Payer: Medicare Other

## 2012-07-19 DIAGNOSIS — E538 Deficiency of other specified B group vitamins: Secondary | ICD-10-CM

## 2012-07-19 MED ORDER — CYANOCOBALAMIN 1000 MCG/ML IJ SOLN
1000.0000 ug | Freq: Once | INTRAMUSCULAR | Status: AC
  Administered 2012-07-19: 1000 ug via INTRAMUSCULAR

## 2012-07-31 NOTE — Telephone Encounter (Signed)
Dr. Damita Dunnings, this has been in my in basket for almost a year.  I can't close it out because it says something is pending and I dont have the access to close it.  Please help.

## 2012-08-23 ENCOUNTER — Ambulatory Visit (INDEPENDENT_AMBULATORY_CARE_PROVIDER_SITE_OTHER): Payer: Medicare Other

## 2012-08-23 DIAGNOSIS — E538 Deficiency of other specified B group vitamins: Secondary | ICD-10-CM

## 2012-08-23 MED ORDER — CYANOCOBALAMIN 1000 MCG/ML IJ SOLN
1000.0000 ug | Freq: Once | INTRAMUSCULAR | Status: AC
  Administered 2012-08-23: 1000 ug via INTRAMUSCULAR

## 2012-08-27 ENCOUNTER — Ambulatory Visit (INDEPENDENT_AMBULATORY_CARE_PROVIDER_SITE_OTHER): Payer: Medicare Other | Admitting: Family Medicine

## 2012-08-27 ENCOUNTER — Encounter: Payer: Self-pay | Admitting: Family Medicine

## 2012-08-27 VITALS — BP 148/92 | HR 88 | Temp 98.5°F | Wt 150.0 lb

## 2012-08-27 DIAGNOSIS — R413 Other amnesia: Secondary | ICD-10-CM

## 2012-08-27 LAB — COMPREHENSIVE METABOLIC PANEL
ALT: 20 U/L (ref 0–35)
AST: 25 U/L (ref 0–37)
Alkaline Phosphatase: 60 U/L (ref 39–117)
BUN: 14 mg/dL (ref 6–23)
Chloride: 106 mEq/L (ref 96–112)
Creatinine, Ser: 0.7 mg/dL (ref 0.4–1.2)
Total Bilirubin: 0.5 mg/dL (ref 0.3–1.2)

## 2012-08-27 LAB — CBC WITH DIFFERENTIAL/PLATELET
Basophils Relative: 1 % (ref 0.0–3.0)
Eosinophils Relative: 1.9 % (ref 0.0–5.0)
HCT: 43.6 % (ref 36.0–46.0)
Hemoglobin: 14.4 g/dL (ref 12.0–15.0)
Lymphs Abs: 2.1 10*3/uL (ref 0.7–4.0)
MCV: 93 fl (ref 78.0–100.0)
Monocytes Absolute: 0.4 10*3/uL (ref 0.1–1.0)
Monocytes Relative: 7.8 % (ref 3.0–12.0)
Neutro Abs: 2.5 10*3/uL (ref 1.4–7.7)
Platelets: 182 10*3/uL (ref 150.0–400.0)
WBC: 5.2 10*3/uL (ref 4.5–10.5)

## 2012-08-27 NOTE — Assessment & Plan Note (Signed)
Broad ddx discussed.  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  Would check basic labs to eval for metabolic cause, check head CT.  She is going to cut back to 2 days a week.  If w/u unremarkable and the dec in scheduled doesn't help, then would likely proceed with anxiety/depression/adjustment treatment.  All d/w pt and daughter.  No Si/hi.  Okay for outpatient f/u. >25 min spent with face to face with patient, >50% counseling and/or coordinating care
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
## 2012-08-27 NOTE — Patient Instructions (Addendum)
Go to the lab on the way out.  We'll contact you with your lab report. See Rosaria Ferries about your referral before you leave today. See if you improve after you cut back to 2 days a week.  If you aren't improving, call the clinic.

## 2012-08-28 LAB — RPR

## 2012-08-29 ENCOUNTER — Other Ambulatory Visit: Payer: Self-pay | Admitting: Family Medicine

## 2012-08-29 DIAGNOSIS — R413 Other amnesia: Secondary | ICD-10-CM

## 2012-08-30 ENCOUNTER — Inpatient Hospital Stay: Admission: RE | Admit: 2012-08-30 | Payer: Medicare Other | Source: Ambulatory Visit

## 2012-08-30 LAB — FOLATE: Folate: 15.6 ng/mL (ref >5.9)

## 2012-08-30 LAB — VITAMIN B12: Vitamin B-12: 592 pg/mL (ref 211–911)

## 2012-09-03 ENCOUNTER — Ambulatory Visit
Admission: RE | Admit: 2012-09-03 | Discharge: 2012-09-03 | Disposition: A | Discharge status: Home / Self Care | Payer: Medicare Other | Source: Ambulatory Visit | Attending: Family Medicine | Admitting: Family Medicine

## 2012-09-03 DIAGNOSIS — R413 Other amnesia: Secondary | ICD-10-CM

## 2012-09-11 ENCOUNTER — Telehealth: Payer: Self-pay

## 2012-09-11 DIAGNOSIS — R413 Other amnesia: Secondary | ICD-10-CM

## 2012-09-11 NOTE — Telephone Encounter (Signed)
Patient notified as instructed by telephone.

## 2012-09-11 NOTE — Telephone Encounter (Signed)
Thanks.  If she has more concerns/troubles, we'll address them then.  I wouldn't do anything else now.  Thanks.

## 2012-09-11 NOTE — Telephone Encounter (Signed)
Pt called to update Dr Damita Dunnings; pt is much better re: to exhaustion, pt  working 2 days a week and pt states feels 100% better.

## 2012-09-27 ENCOUNTER — Ambulatory Visit (INDEPENDENT_AMBULATORY_CARE_PROVIDER_SITE_OTHER): Payer: Medicare Other | Admitting: *Deleted

## 2012-09-27 DIAGNOSIS — E538 Deficiency of other specified B group vitamins: Secondary | ICD-10-CM

## 2012-09-27 MED ORDER — CYANOCOBALAMIN 1000 MCG/ML IJ SOLN
1000.0000 ug | Freq: Once | INTRAMUSCULAR | Status: AC
  Administered 2012-09-27: 1000 ug via INTRAMUSCULAR

## 2012-11-05 ENCOUNTER — Ambulatory Visit: Payer: Medicare Other

## 2012-11-12 ENCOUNTER — Ambulatory Visit (INDEPENDENT_AMBULATORY_CARE_PROVIDER_SITE_OTHER): Payer: MEDICARE | Admitting: *Deleted

## 2012-11-12 DIAGNOSIS — E538 Deficiency of other specified B group vitamins: Secondary | ICD-10-CM

## 2012-11-12 MED ORDER — CYANOCOBALAMIN 1000 MCG/ML IJ SOLN
1000.0000 ug | Freq: Once | INTRAMUSCULAR | Status: AC
  Administered 2012-11-12: 1000 ug via INTRAMUSCULAR

## 2012-12-13 ENCOUNTER — Ambulatory Visit (INDEPENDENT_AMBULATORY_CARE_PROVIDER_SITE_OTHER): Payer: MEDICARE | Admitting: *Deleted

## 2012-12-13 DIAGNOSIS — Z23 Encounter for immunization: Secondary | ICD-10-CM

## 2012-12-13 DIAGNOSIS — E538 Deficiency of other specified B group vitamins: Secondary | ICD-10-CM

## 2012-12-13 MED ORDER — CYANOCOBALAMIN 1000 MCG/ML IJ SOLN
1000.0000 ug | Freq: Once | INTRAMUSCULAR | Status: AC
  Administered 2012-12-13: 1000 ug via INTRAMUSCULAR

## 2013-01-16 ENCOUNTER — Ambulatory Visit (INDEPENDENT_AMBULATORY_CARE_PROVIDER_SITE_OTHER): Payer: Medicare Other | Admitting: *Deleted

## 2013-01-16 DIAGNOSIS — E538 Deficiency of other specified B group vitamins: Secondary | ICD-10-CM

## 2013-01-16 MED ORDER — CYANOCOBALAMIN 1000 MCG/ML IJ SOLN
1000.0000 ug | Freq: Once | INTRAMUSCULAR | Status: AC
  Administered 2013-01-16: 1000 ug via INTRAMUSCULAR

## 2013-02-20 ENCOUNTER — Telehealth: Payer: Self-pay | Admitting: Family Medicine

## 2013-02-20 ENCOUNTER — Ambulatory Visit (INDEPENDENT_AMBULATORY_CARE_PROVIDER_SITE_OTHER): Payer: Medicare Other | Admitting: *Deleted

## 2013-02-20 DIAGNOSIS — E538 Deficiency of other specified B group vitamins: Secondary | ICD-10-CM

## 2013-02-20 MED ORDER — CYANOCOBALAMIN 1000 MCG/ML IJ SOLN
1000.0000 ug | Freq: Once | INTRAMUSCULAR | Status: AC
  Administered 2013-02-20: 1000 ug via INTRAMUSCULAR

## 2013-02-20 NOTE — Telephone Encounter (Signed)
Susan [name] was in today for her B-12 injection.  Because we are unable to schedule future injections due to the nationwide shortage, please let her know how you would like to proceed.    Best number [phone]. Please leave a message if she cannot answer.

## 2013-02-23 NOTE — Telephone Encounter (Signed)
I would switch over to OTC B12 1047mg a day and recheck a level in 6 weeks.  Orders are in. Thanks.

## 2013-02-24 NOTE — Telephone Encounter (Signed)
Patient advised.  Lab appt scheduled.

## 2013-04-09 ENCOUNTER — Other Ambulatory Visit (INDEPENDENT_AMBULATORY_CARE_PROVIDER_SITE_OTHER): Payer: Medicare Other

## 2013-04-09 DIAGNOSIS — E538 Deficiency of other specified B group vitamins: Secondary | ICD-10-CM

## 2013-05-22 ENCOUNTER — Telehealth: Payer: Self-pay | Admitting: *Deleted

## 2013-05-22 ENCOUNTER — Other Ambulatory Visit: Payer: Self-pay | Admitting: Family Medicine

## 2013-05-22 DIAGNOSIS — I6523 Occlusion and stenosis of bilateral carotid arteries: Secondary | ICD-10-CM | POA: Insufficient documentation

## 2013-05-22 NOTE — Telephone Encounter (Signed)
See note below.  Also, patient is asking if she needs to get her B-12 level checked since she is now on the oral B-12 supplement?  She says it was fine last time and she had been on the oral med for a while at that time.  She says she feels fine.

## 2013-05-22 NOTE — Telephone Encounter (Signed)
Patient is asking why she needs the 1 year follow up of the carotid US.  She says she was of the understanding that everything was okay.

## 2013-05-22 NOTE — Telephone Encounter (Signed)
B12- I would give this a little longer since it was normal in 6/14.  Would recheck at a physical in the fall, ~08/2013.  Carotids- it was okay with minimal plaque in the carotids. She didn't need intervention last year. Would recheck only to make sure it hasn't progressed.  If still stable, we can likely dispense with future rechecks.

## 2013-05-22 NOTE — Telephone Encounter (Signed)
Left detailed message on voicemail at patient's request.

## 2013-06-25 ENCOUNTER — Encounter (INDEPENDENT_AMBULATORY_CARE_PROVIDER_SITE_OTHER): Payer: Medicare Other

## 2013-06-25 DIAGNOSIS — I6529 Occlusion and stenosis of unspecified carotid artery: Secondary | ICD-10-CM

## 2013-06-25 DIAGNOSIS — I6523 Occlusion and stenosis of bilateral carotid arteries: Secondary | ICD-10-CM

## 2013-06-30 ENCOUNTER — Other Ambulatory Visit: Payer: Self-pay | Admitting: Family Medicine

## 2013-06-30 DIAGNOSIS — R9389 Abnormal findings on diagnostic imaging of other specified body structures: Secondary | ICD-10-CM

## 2013-07-03 ENCOUNTER — Encounter: Payer: Self-pay | Admitting: Family Medicine

## 2013-07-03 ENCOUNTER — Ambulatory Visit (INDEPENDENT_AMBULATORY_CARE_PROVIDER_SITE_OTHER): Payer: Medicare Other | Admitting: Family Medicine

## 2013-07-03 VITALS — BP 132/80 | HR 83 | Temp 98.0°F | Wt 150.5 lb

## 2013-07-03 DIAGNOSIS — J069 Acute upper respiratory infection, unspecified: Secondary | ICD-10-CM

## 2013-07-03 NOTE — Patient Instructions (Addendum)
Drink plenty of fluids, take tylenol as needed, and gargle with warm salt water for your throat.  This should gradually improve.  Take care.  Let us know if you have other concerns.

## 2013-07-03 NOTE — Progress Notes (Signed)
She had retired and was overall doing well. She hadn't had an illness, URI sx until recently.  She woke up with sx today.  Dry throat. Scratchy throat.  Hoarse voice.  No fevers.  No ear pain. Some sinus pressure but not pain.  Some cough, comes in fits. Did gargle with salt water today.    Her mother died 04-02-2023. I offered my condolences.   Her memory is better after retirement, with a less stressful day to day environment.     Meds, vitals, and allergies reviewed.   ROS: See HPI.  Otherwise, noncontributory.  GEN: nad, alert and oriented HEENT: mucous membranes moist, tm w/o erythema, nasal exam w/o erythema, clear discharge noted,  OP with cobblestoning, sinuses not ttp x4 NECK: supple w/o LA CV: rrr.   PULM: ctab, no inc wob EXT: no edema SKIN: no acute rash

## 2013-07-04 DIAGNOSIS — J069 Acute upper respiratory infection, unspecified: Secondary | ICD-10-CM | POA: Insufficient documentation

## 2013-07-04 NOTE — Assessment & Plan Note (Signed)
Likely viral vs allergic, not likely to be bacterial. Supportive tx and fu prn.  Nontoxic.  She agrees.

## 2013-08-04 ENCOUNTER — Ambulatory Visit: Payer: Self-pay | Admitting: Family Medicine

## 2013-08-05 ENCOUNTER — Encounter: Payer: Self-pay | Admitting: Family Medicine

## 2013-08-06 ENCOUNTER — Encounter: Payer: Self-pay | Admitting: Family Medicine

## 2013-08-06 ENCOUNTER — Other Ambulatory Visit: Payer: Self-pay | Admitting: Family Medicine

## 2013-08-06 DIAGNOSIS — E042 Nontoxic multinodular goiter: Secondary | ICD-10-CM | POA: Insufficient documentation

## 2013-08-07 ENCOUNTER — Other Ambulatory Visit: Payer: Self-pay | Admitting: Family Medicine

## 2013-08-07 DIAGNOSIS — I10 Essential (primary) hypertension: Secondary | ICD-10-CM

## 2013-08-07 DIAGNOSIS — E538 Deficiency of other specified B group vitamins: Secondary | ICD-10-CM

## 2013-08-07 DIAGNOSIS — M810 Age-related osteoporosis without current pathological fracture: Secondary | ICD-10-CM

## 2013-08-18 ENCOUNTER — Other Ambulatory Visit (INDEPENDENT_AMBULATORY_CARE_PROVIDER_SITE_OTHER): Payer: Medicare Other

## 2013-08-18 DIAGNOSIS — E538 Deficiency of other specified B group vitamins: Secondary | ICD-10-CM

## 2013-08-18 DIAGNOSIS — E042 Nontoxic multinodular goiter: Secondary | ICD-10-CM

## 2013-08-18 DIAGNOSIS — I10 Essential (primary) hypertension: Secondary | ICD-10-CM

## 2013-08-18 DIAGNOSIS — M810 Age-related osteoporosis without current pathological fracture: Secondary | ICD-10-CM

## 2013-08-18 LAB — LIPID PANEL
HDL: 52 mg/dL (ref >39.00)
Total CHOL/HDL Ratio: 4
Triglycerides: 113 mg/dL (ref 0.0–149.0)

## 2013-08-18 LAB — COMPREHENSIVE METABOLIC PANEL
ALT: 18 U/L (ref 0–35)
AST: 26 U/L (ref 0–37)
BUN: 13 mg/dL (ref 6–23)
Calcium: 9.2 mg/dL (ref 8.4–10.5)
Chloride: 105 mEq/L (ref 96–112)
Creatinine, Ser: 0.7 mg/dL (ref 0.4–1.2)
Sodium: 141 mEq/L (ref 135–145)
Total Bilirubin: 0.7 mg/dL (ref 0.3–1.2)

## 2013-08-18 LAB — VITAMIN B12: Vitamin B-12: 454 pg/mL (ref 211–911)

## 2013-08-19 LAB — VITAMIN D 25 HYDROXY (VIT D DEFICIENCY, FRACTURES): Vit D, 25-Hydroxy: 33 ng/mL (ref 30–89)

## 2013-08-25 ENCOUNTER — Ambulatory Visit (INDEPENDENT_AMBULATORY_CARE_PROVIDER_SITE_OTHER): Payer: Medicare Other | Admitting: Family Medicine

## 2013-08-25 ENCOUNTER — Encounter: Payer: Self-pay | Admitting: Family Medicine

## 2013-08-25 VITALS — BP 142/80 | HR 66 | Temp 97.9°F | Ht 61.0 in | Wt 149.0 lb

## 2013-08-25 DIAGNOSIS — M810 Age-related osteoporosis without current pathological fracture: Secondary | ICD-10-CM

## 2013-08-25 DIAGNOSIS — Z0000 Encounter for general adult medical examination without abnormal findings: Secondary | ICD-10-CM

## 2013-08-25 DIAGNOSIS — Z23 Encounter for immunization: Secondary | ICD-10-CM

## 2013-08-25 DIAGNOSIS — Z1231 Encounter for screening mammogram for malignant neoplasm of breast: Secondary | ICD-10-CM

## 2013-08-25 DIAGNOSIS — R109 Unspecified abdominal pain: Secondary | ICD-10-CM | POA: Insufficient documentation

## 2013-08-25 NOTE — Assessment & Plan Note (Signed)
Recheck DXA pending.  S/p 5 years of fosamax prev.

## 2013-08-25 NOTE — Assessment & Plan Note (Signed)
Would recheck abd u/s.  Benign exam except for RUQ w/o rebound.  This is ongoing and chronic for patient, not acute.  She does have h/o IBS and certain foods, ie lettuce, trigger diarrhea.  She has no red flag sx at this point.

## 2013-08-25 NOTE — Progress Notes (Signed)
CPE- See plan.  Routine anticipatory guidance given to patient.  See health maintenance. Tetanus 2011 PNA 2011 Flu shot due Shingles shot d/w pt.  Colonoscopy 2012 Mammogram due.  D/w pt.   DXA d/w pt.  Due for repeat.  S/p 5 years of fosamax.   Pap not indicated.   Diet and exercise d/w pt.  Living will encouraged.  Husband designated if incapacitated.    Intermittent abd pain across the abd. She didn't know if she had a hernia.  Known gallstones prev.  No vomiting, no blood in stool. Occ diarrhea, but that is at baseline and unchanged.     She has f/u with endo pending for thyroid nodules.    PMH and SH reviewed  Meds, vitals, and allergies reviewed.   ROS: See HPI.  Otherwise negative.    GEN: nad, alert and oriented HEENT: mucous membranes moist NECK: supple w/o LA CV: rrr. PULM: ctab, no inc wob ABD: soft, +bs, mildly ttp in RUQ w/o hernia EXT: no edema SKIN: no acute rash Breast exam: No mass, nodules, thickening, tenderness, bulging, retraction, inflamation, nipple discharge or skin changes noted.  No axillary or clavicular LA.  Chaperoned exam.

## 2013-08-25 NOTE — Addendum Note (Signed)
Addended by: Josetta Huddle on: 08/25/2013 11:52 AM   Modules accepted: Orders

## 2013-08-25 NOTE — Assessment & Plan Note (Signed)
Routine anticipatory guidance given to patient.  See health maintenance. Tetanus 2011 PNA 2011 Flu shot due Shingles shot d/w pt.  Colonoscopy 2012 Mammogram due.  D/w pt.   DXA d/w pt.  Due for repeat.  S/p 5 years of fosamax.   Pap not indicated.   Diet and exercise d/w pt.  Living will encouraged.  Husband designated if incapacitated.

## 2013-08-25 NOTE — Patient Instructions (Addendum)
Check with your insurance to see if they will cover the shingles shot. Susan [name] will call about your referral for the mammogram and the bone density.  She'll set up the ultrasound, too.  Take care. Glad to see you.

## 2013-08-26 ENCOUNTER — Ambulatory Visit: Payer: Medicare Other | Admitting: Internal Medicine

## 2013-08-28 ENCOUNTER — Encounter: Payer: Self-pay | Admitting: Internal Medicine

## 2013-08-28 ENCOUNTER — Encounter: Payer: Medicare Other | Admitting: Family Medicine

## 2013-08-28 ENCOUNTER — Ambulatory Visit (INDEPENDENT_AMBULATORY_CARE_PROVIDER_SITE_OTHER): Payer: Medicare Other | Admitting: Internal Medicine

## 2013-08-28 VITALS — BP 110/70 | HR 94 | Temp 98.4°F | Resp 10 | Ht 61.0 in | Wt 149.2 lb

## 2013-08-28 DIAGNOSIS — E042 Nontoxic multinodular goiter: Secondary | ICD-10-CM

## 2013-08-28 NOTE — Progress Notes (Signed)
Patient ID: Susan [name], female   DOB: 1946-06-21, 67 y.o.   MRN: [id number]   HPI  Susan [name] is a 67 y.o.-year-old female, referred by her PCP, Dr.Duncan, for evaluation for MNG.  She has had a carotid U/S and thyroid nodules were found incidentally. She had a Thyroid U/S: 2 large thyroid nodules in left lobe, right lobe missing (atrophic?).  She was told ~10 years ago that she has hypothyroidism, then she was started on Levothyroxine, which she took for 5 years, then she started a homeopathic medicine and her TFTs normalized afterwards, so she stayed off this medicine. She also remembers having had a thyroid Uptake and scan (ordered by Dr Ronnald Collum). Around that time, she was also told she had nodules and had Bx of the nodules >> benign. The history is very vague as pt cannot remember details or the chronology of the above.  I reviewed pt's thyroid tests: Lab Results  Component Value Date   TSH 2.58 08/18/2013   TSH 3.11 08/27/2012   TSH 2.747 01/12/2012   TSH 2.77 08/05/2010    Pt denies feeling nodules in neck, has hoarseness, no dysphagia/odynophagia, but feels like she has to clear her throat more sometimes, SOB with lying down.  Pt c/o: - + heat intolerance (hot flushes) - no tremors - has situational anxiety/no depression - no palpitations - + fatigue - + hyperdefecation: 3-4 stools a day - no weight loss/weight gain - dry skin - + pain in muscles of arms and left   Pt does have a FH of thyroid ds: mother. No FH of thyroid cancer. No h/o radiation tx to head or neck.  No seaweed or kelp, no recent contrast studies. No steroid use. No herbal supplements.   I reviewed her chart and she also has a history of HTN, IBS, OP.   ROS: Constitutional: see HPI Eyes: no blurry vision, no xerophthalmia ENT: no sore throat, no nodules palpated in throat, no dysphagia/odynophagia, no hoarseness, +decreased hearing Cardiovascular: no CP/SOB/palpitations/leg  swelling Respiratory: no cough/SOB Gastrointestinal: +N/no V/+D/no C Musculoskeletal: +muscle/+joint aches Skin: no rashes, + itching Neurological: no tremors/numbness/tingling/dizziness, + occas. HA Psychiatric: no depression/anxiety  Past Medical History  Diagnosis Date  . Hypertension   . Osteoporosis     on Fosamax for 5 years  . Diverticulosis of colon   . Other chronic nonalcoholic liver disease     LFTs normalized with weight loss 2013  . Colon cancer screening   . Other screening mammogram   . Other abnormal glucose   . Urinary tract infection, site not specified   . Migraine, unspecified, without mention of intractable migraine without mention of status migrainosus   . Symptomatic menopausal or female climacteric states   . Ocular migraine    Past Surgical History  Procedure Laterality Date  . Btl  30+ years ago  . Tonsillectomy and adenoidectomy  Age 67  . Ankle fracture surgery  ~2002    pinning  . Abdominal ultrasound  04/12/2004 & 6/06    positive gallstones  . Ct abdomen  04/29/04    Positive gallstones  . Ultrasound pelvis  6/06    Negative  . Colonoscopy  06/08/06    Polyps, divertics (Dr. Sharlett Iles)  . Cataract extraction  2013    B eyes   History   Social History  . Marital Status: Married    Spouse Name: N/A    Number of Children: 2   Occupational History  . [employer] (  Optometrist)    Social History Main Topics  . Smoking status: Former Smoker    Types: Cigarettes  . Smokeless tobacco: Not on file     Comment: Quit in 2001  . Alcohol Use: Yes     Comment: Occasional  . Drug Use: No   Social History Narrative   From [place].  Enjoys times with grandkids.     Minimal exercise   Daily Caffeine Use:  3 daily.   Current Outpatient Prescriptions on File Prior to Visit  Medication Sig Dispense Refill  . aspirin 325 MG tablet Take 325 mg by mouth daily as needed.       . vitamin B-12 (CYANOCOBALAMIN) 1000 MCG tablet Take 1,000 mcg  by mouth daily.       No current facility-administered medications on file prior to visit.   Allergies  Allergen Reactions  . Penicillins     REACTION: rash  . Sulfonamide Derivatives     REACTION: rash   Family History  Problem Relation Age of Onset  . Depression Mother     Manic depression  . Diabetes Mother     Type 2  . Breast cancer Mother   . Colon polyps Father   . Heart disease Father   . Cancer Neg Hx   . Colon cancer Neg Hx   . Breast cancer Maternal Aunt   . Breast cancer Paternal Aunt    PE: BP 110/70  Pulse 94  Temp(Src) 98.4 F (36.9 C) (Oral)  Resp 10  Ht 5' 1"  (1.549 m)  Wt 149 lb 3.2 oz (67.677 kg)  BMI 28.21 kg/m2  SpO2 97% Wt Readings from Last 3 Encounters:  08/28/13 149 lb 3.2 oz (67.677 kg)  08/25/13 149 lb (67.586 kg)  07/03/13 150 lb 8 oz (68.266 kg)    Constitutional: overweight, in NAD Eyes: PERRLA, EOMI, no exophthalmos ENT: moist mucous membranes, + thyromegaly, no cervical lymphadenopathy Cardiovascular: RRR, No MRG Respiratory: CTA B Gastrointestinal: abdomen soft, NT, ND, BS+ Musculoskeletal: no deformities, strength intact in all 4;  Skin: moist, warm, no rashes Neurological: no tremor with outstretched hands, DTR normal in all 4  ASSESSMENT: 1. MNG - thyroid U/S-[facility] (08/04/2013):   right lobe and isthmus not demonstrated  L lobe 6.8 x 4.6 x 3.6 cm  Nodules: LUL 3.1 x 2.8 x ?3cm, LLL 4.1 x 3.8 x 4.6 cm  - both iso- or slightly hyperechoic  PLAN: 1. MNG  - I reviewed the report of her thyroid ultrasound along with the patient. I pointed out that the dominant nodules are large, this being a risk factor for cancer. Otherwise, the nodules are not described to have calcifications, internal blood flow, or indistinct margins. Moreover, they are iso- or even possibly hyperechoic. Pt does not have a thyroid cancer family history or a personal history of RxTx to head/neck. All these would favor benignity. I believe her risk of  cancer is less than 10-15%.  - the only way that we can tell exactly if it is cancer or not is by doing a thyroid biopsy (FNA). She did have an FNA before, but I am not sure if both nodules were Bx'ed. We will try to obtain the records from Dr Ronnald Collum. Since this was several years ago, I believe the best course of action is to repeat the Bx. Pt decided to have the FNA done  >> I ordered this. - I explained that this is not cancer, we can continue to follow her on a  yearly basis, and check another ultrasound in another year or 2. - she should let me know if she develops neck compression symptoms - I'll see her back in a year, assuming her FNA is normal. If FNA abnormal, we will meet sooner.    Received records for pt from Dr Ronnald Collum - pt not seen in his office since 2005: - 07/10/1997: FNA (site not specified): colloid nodule - 12/22/1998: FNA (sorce: left thyroid, increasind size of nodule): colloid nodule - 07/09/2001: FNA (source: left thyroid; 4x4 cm large mass in RLL - ?, increasing in size): hyperplastic nodule  Adequacy Reason Satisfactory For Evaluation. Diagnosis THYROID, FINE NEEDLE ASPIRATION, LEFT UPPER LOBE (SPECIMEN 2 OF 2, COLLECTED ON 09/02/2013): BENIGN FINDINGS CONSISTENT WITH NON-NEOPLASTIC GOITER. Specimen Clinical Information Abnormal thyroid on carotid US, Left dominant upper pole nodule 3.1cm Source Thyroid, Fine Needle Aspiration, Left Upper Lobe, (Specimen 2 of 2, collected on 09/02/2013)  Adequacy Reason Satisfactory For Evaluation. Diagnosis THYROID, FINE NEEDLE ASPIRATION, LEFT LOWER LOBE (SPECIMEN 1 OF 2, COLLECTED ON 09/02/2013): BENIGN. FINDINGS CONSISTENT WITH NON-NEOPLASTIC GOITER. Specimen Clinical Information Abnormal thyroid on carotid US, Dominant lower pole nodule 4.6cm

## 2013-08-28 NOTE — Patient Instructions (Signed)
Please expect a call to schedule the biopsy of your thyroid nodules. Please return in 1 year, but let me know if you develop problems swallowing or breathing in the meantime.

## 2013-09-01 ENCOUNTER — Other Ambulatory Visit: Payer: Self-pay | Admitting: Internal Medicine

## 2013-09-01 DIAGNOSIS — E042 Nontoxic multinodular goiter: Secondary | ICD-10-CM

## 2013-09-02 ENCOUNTER — Ambulatory Visit
Admission: RE | Admit: 2013-09-02 | Discharge: 2013-09-02 | Disposition: A | Discharge status: Home / Self Care | Payer: Medicare Other | Source: Ambulatory Visit | Attending: Internal Medicine | Admitting: Internal Medicine

## 2013-09-02 ENCOUNTER — Other Ambulatory Visit (HOSPITAL_COMMUNITY)
Admission: RE | Admit: 2013-09-02 | Discharge: 2013-09-02 | Disposition: A | Discharge status: Home / Self Care | Payer: Medicare Other | Source: Ambulatory Visit | Attending: Interventional Radiology | Admitting: Interventional Radiology

## 2013-09-02 DIAGNOSIS — E042 Nontoxic multinodular goiter: Secondary | ICD-10-CM

## 2013-09-02 DIAGNOSIS — E041 Nontoxic single thyroid nodule: Secondary | ICD-10-CM | POA: Insufficient documentation

## 2013-09-07 ENCOUNTER — Encounter: Payer: Self-pay | Admitting: Internal Medicine

## 2013-09-16 ENCOUNTER — Encounter: Payer: Self-pay | Admitting: Family Medicine

## 2013-09-19 ENCOUNTER — Encounter: Payer: Self-pay | Admitting: Gastroenterology

## 2013-11-10 ENCOUNTER — Telehealth: Payer: Self-pay | Admitting: Family Medicine

## 2013-11-10 DIAGNOSIS — M25579 Pain in unspecified ankle and joints of unspecified foot: Secondary | ICD-10-CM

## 2013-11-10 NOTE — Telephone Encounter (Signed)
Referral placed, have her try to get her records and imaging from the outside clinic to take to ortho.  Thanks.

## 2013-11-10 NOTE — Telephone Encounter (Signed)
Patient advised.
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
## 2014-07-08 ENCOUNTER — Ambulatory Visit
Admission: RE | Admit: 2014-07-08 | Discharge: 2014-07-08 | Disposition: A | Discharge status: Home / Self Care | Payer: Commercial Managed Care - HMO | Source: Ambulatory Visit | Attending: Family Medicine | Admitting: Family Medicine

## 2014-07-08 ENCOUNTER — Ambulatory Visit (INDEPENDENT_AMBULATORY_CARE_PROVIDER_SITE_OTHER)
Admission: RE | Admit: 2014-07-08 | Discharge: 2014-07-08 | Disposition: A | Discharge status: Home / Self Care | Payer: Commercial Managed Care - HMO | Source: Ambulatory Visit | Attending: Family Medicine | Admitting: Family Medicine

## 2014-07-08 ENCOUNTER — Encounter: Payer: Self-pay | Admitting: Family Medicine

## 2014-07-08 ENCOUNTER — Ambulatory Visit (INDEPENDENT_AMBULATORY_CARE_PROVIDER_SITE_OTHER): Payer: Commercial Managed Care - HMO | Admitting: Family Medicine

## 2014-07-08 VITALS — BP 130/78 | HR 74 | Temp 98.4°F | Wt 149.5 lb

## 2014-07-08 DIAGNOSIS — M2550 Pain in unspecified joint: Secondary | ICD-10-CM

## 2014-07-08 DIAGNOSIS — E042 Nontoxic multinodular goiter: Secondary | ICD-10-CM

## 2014-07-08 DIAGNOSIS — E538 Deficiency of other specified B group vitamins: Secondary | ICD-10-CM

## 2014-07-08 DIAGNOSIS — M810 Age-related osteoporosis without current pathological fracture: Secondary | ICD-10-CM

## 2014-07-08 DIAGNOSIS — R7309 Other abnormal glucose: Secondary | ICD-10-CM

## 2014-07-08 LAB — CBC WITH DIFFERENTIAL/PLATELET
BASOS ABS: 0 10*3/uL (ref 0.0–0.1)
Basophils Relative: 0.7 % (ref 0.0–3.0)
EOS ABS: 0 10*3/uL (ref 0.0–0.7)
Eosinophils Relative: 0.7 % (ref 0.0–5.0)
HEMATOCRIT: 43.3 % (ref 36.0–46.0)
Hemoglobin: 14.5 g/dL (ref 12.0–15.0)
LYMPHS ABS: 2.7 10*3/uL (ref 0.7–4.0)
Lymphocytes Relative: 41.9 % (ref 12.0–46.0)
MCHC: 33.5 g/dL (ref 30.0–36.0)
MCV: 92.7 fl (ref 78.0–100.0)
Monocytes Absolute: 0.5 10*3/uL (ref 0.1–1.0)
Monocytes Relative: 7.3 % (ref 3.0–12.0)
NEUTROS PCT: 49.4 % (ref 43.0–77.0)
Neutro Abs: 3.2 10*3/uL (ref 1.4–7.7)
PLATELETS: 185 10*3/uL (ref 150.0–400.0)
RBC: 4.67 Mil/uL (ref 3.87–5.11)
RDW: 12.9 % (ref 11.5–15.5)
WBC: 6.4 10*3/uL (ref 4.0–10.5)

## 2014-07-08 LAB — COMPREHENSIVE METABOLIC PANEL
ALBUMIN: 4.4 g/dL (ref 3.5–5.2)
ALK PHOS: 46 U/L (ref 39–117)
ALT: 21 U/L (ref 0–35)
AST: 26 U/L (ref 0–37)
BUN: 14 mg/dL (ref 6–23)
CO2: 30 mEq/L (ref 19–32)
Calcium: 9.7 mg/dL (ref 8.4–10.5)
Chloride: 102 mEq/L (ref 96–112)
Creatinine, Ser: 0.7 mg/dL (ref 0.4–1.2)
GFR: 88.18 mL/min (ref >60.00)
GLUCOSE: 89 mg/dL (ref 70–99)
POTASSIUM: 4.4 meq/L (ref 3.5–5.1)
SODIUM: 140 meq/L (ref 135–145)
TOTAL PROTEIN: 7.5 g/dL (ref 6.0–8.3)
Total Bilirubin: 0.9 mg/dL (ref 0.2–1.2)

## 2014-07-08 LAB — SEDIMENTATION RATE: SED RATE: 17 mm/h (ref 0–22)

## 2014-07-08 LAB — CK: CK TOTAL: 105 U/L (ref 7–177)

## 2014-07-08 LAB — URIC ACID: Uric Acid, Serum: 4.8 mg/dL (ref 2.4–7.0)
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
## 2014-07-08 NOTE — Patient Instructions (Signed)
Go to the lab on the way out.  We'll contact you with your lab report. Don't change your meds for now. Take care.   Glad to see you.

## 2014-07-09 ENCOUNTER — Ambulatory Visit: Payer: Commercial Managed Care - HMO

## 2014-07-09 DIAGNOSIS — M2550 Pain in unspecified joint: Secondary | ICD-10-CM | POA: Insufficient documentation

## 2014-07-09 DIAGNOSIS — E041 Nontoxic single thyroid nodule: Secondary | ICD-10-CM

## 2014-07-09 LAB — TSH: TSH: 2.69 u[IU]/mL (ref 0.35–4.50)

## 2014-07-09 NOTE — Assessment & Plan Note (Signed)
Labs unremarkable. Imaging c/w OA. See notes on labs/imaging.   Likely muscle tightness in the trapezius B and the lower back, not true shoulder or hip pathology.  Likely OA in the hands, presumed same in the knees.  Would change to aleve, use heat on lower back and trap B.  If not improved, will have patient notify us.  Will ask to have TSH added on given the fatigue.   >25 minutes spent in face to face time with patient, >50% spent in counselling or coordination of care.

## 2014-07-24 ENCOUNTER — Encounter: Payer: Self-pay | Admitting: Family Medicine

## 2014-08-27 ENCOUNTER — Other Ambulatory Visit: Payer: Self-pay | Admitting: *Deleted

## 2014-08-27 ENCOUNTER — Encounter: Payer: Self-pay | Admitting: Internal Medicine

## 2014-08-27 ENCOUNTER — Ambulatory Visit (INDEPENDENT_AMBULATORY_CARE_PROVIDER_SITE_OTHER): Payer: Commercial Managed Care - HMO | Admitting: Internal Medicine

## 2014-08-27 VITALS — BP 130/70 | HR 65 | Temp 98.0°F | Resp 12 | Wt 146.0 lb

## 2014-08-27 DIAGNOSIS — R5383 Other fatigue: Secondary | ICD-10-CM

## 2014-08-27 DIAGNOSIS — E042 Nontoxic multinodular goiter: Secondary | ICD-10-CM

## 2014-08-27 LAB — VITAMIN B12: VITAMIN B 12: 500 pg/mL (ref 211–911)

## 2014-08-27 LAB — VITAMIN D 25 HYDROXY (VIT D DEFICIENCY, FRACTURES): VITD: 29.65 ng/mL — AB (ref 30.00–100.00)

## 2014-08-27 NOTE — Progress Notes (Signed)
Patient ID: Susan [name], female   DOB: 07-21-1946, 68 y.o.   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 Last visit 1 year ago.  Reviewed hx: She has had a carotid U/S and thyroid nodules were found incidentally. She had a Thyroid U/S: 2 large thyroid nodules in left lobe, right lobe missing (atrophic?).  She was told ~10 years ago that she has hypothyroidism, then she was started on Levothyroxine, which she took for 5 years, then she started a homeopathic medicine and her TFTs normalized afterwards, so she stayed off this medicine. She also remembers having had a thyroid Uptake and scan (ordered by Dr Francoise Schaumann). Around that time, she was also told she had nodules and had Bx of the nodules >> benign. The history is very vague as pt cannot remember details or the chronology of the above.  We obtained the records from Dr Francoise Schaumann >> benign nodules  We repeated the FNA of both L nodules >> benign.  I reviewed pt's thyroid tests: Lab Results  Component Value Date   TSH 2.69 07/09/2014   TSH 2.58 08/18/2013   TSH 3.11 08/27/2012   TSH 2.747 01/12/2012   TSH 2.77 08/05/2010    Pt denies feeling nodules in neck, has hoarseness, no dysphagia/odynophagia, but feels like she has to clear her throat more sometimes, SOB with lying down.  Pt c/o: - no tremors - has situational anxiety/no depression - no palpitations - + fatigue ~ 4pm - + diarrhea - no weight loss/weight gain - dry skin  Pt does have a FH of thyroid ds: mother. No FH of thyroid cancer. No h/o radiation tx to head or neck.  No seaweed or kelp, no recent contrast studies. No steroid use. No herbal supplements.   I reviewed her chart and she also has a history of HTN, IBS, OP.   ROS: Constitutional: see HPI Eyes: no blurry vision, no xerophthalmia ENT: no sore throat, no nodules palpated in throat, no dysphagia/odynophagia, no hoarseness Cardiovascular:  no CP/SOB/palpitations/leg swelling Respiratory: no cough/SOB Gastrointestinal: no N/V/+ D/no C Musculoskeletal: + both muscle/joint aches Skin: no rashes Neurological: no tremors/numbness/tingling/dizziness  PE: BP 130/70  Pulse 65  Temp(Src) 98 F (36.7 C) (Oral)  Resp 12  Wt 146 lb (66.225 kg)  SpO2 97% Body mass index is 27.6 kg/(m^2).  Wt Readings from Last 3 Encounters:  08/27/14 146 lb (66.225 kg)  07/08/14 149 lb 8 oz (67.813 kg)  08/28/13 149 lb 3.2 oz (67.677 kg)   Constitutional: normal weight, in NAD Eyes: PERRLA, EOMI, no exophthalmos ENT: moist mucous membranes, + thyromegaly L>R, no cervical lymphadenopathy Cardiovascular: RRR, No MRG Respiratory: CTA B Gastrointestinal: abdomen soft, NT, ND, BS+ Musculoskeletal: no deformities, strength intact in all 4;  Skin: moist, warm, no rashes Neurological: no tremor with outstretched hands, DTR normal in all 4  ASSESSMENT: 1. MNG - thyroid U/S-[facility] (08/04/2013):   right lobe and isthmus not demonstrated  L lobe 6.8 x 4.6 x 3.6 cm  Nodules: LUL 3.1 x 2.8 x ?3cm, LLL 4.1 x 3.8 x 4.6 cm  - both iso- or slightly hyperechoic  Received records for pt from Dr Ronnald Collum - pt not seen in his office since 2005: - 07/10/1997: FNA (site not specified): colloid nodule - 12/22/1998: FNA (sorce: left thyroid, increasind size of nodule): colloid nodule - 07/09/2001: FNA (source: left thyroid; 4x4 cm large mass in RLL - ?, increasing in  size): hyperplastic nodule  Adequacy Reason Satisfactory For Evaluation. Diagnosis THYROID, FINE NEEDLE ASPIRATION, LEFT UPPER LOBE (SPECIMEN 2 OF 2, COLLECTED ON 09/02/2013): BENIGN FINDINGS CONSISTENT WITH NON-NEOPLASTIC GOITER. Specimen Clinical Information Abnormal thyroid on carotid US, Left dominant upper pole nodule 3.1cm Source Thyroid, Fine Needle Aspiration, Left Upper Lobe, (Specimen 2 of 2, collected on 09/02/2013)  Adequacy Reason Satisfactory For  Evaluation. Diagnosis THYROID, FINE NEEDLE ASPIRATION, LEFT LOWER LOBE (SPECIMEN 1 OF 2, COLLECTED ON 09/02/2013): BENIGN. FINDINGS CONSISTENT WITH NON-NEOPLASTIC GOITER. Specimen Clinical Information Abnormal thyroid on carotid US, Dominant lower pole nodule 4.6cm  2. Fatigue  PLAN: 1. MNG  - I reviewed the report of her thyroid ultrasound and the Bx reports along with the patient. The 2 nodules are benign. She does not have  - she should let me know if she develops neck compression symptoms - we reviewed her latest TSH, which was normal - we discuss to only follow the nodules clinically from now on >> she needs to let me know if she feels neck compression sxs or sees the nodules enlarging  2. Fatigue - most ~4 pm each day - has a h/o B12 def >> taking 1000 mcg daily, was in im B12; last level 1 year ago: 400 >> will recheck.  - takes vitamin D 1000 units daily >> will check - will also check a ferritin, but reviewed CBC and no anemia  Office Visit on 08/27/2014  Component Date Value Ref Range Status  . VITD 08/27/2014 29.65* 30.00 - 100.00 ng/mL Final  . Vitamin B-12 08/27/2014 500  211 - 911 pg/mL Final   Vit D def >> will advise to increase the vit D supplement to 2000 IU daily. B12 normal >> continue current dose of 1000 mcg B12 daily.

## 2014-08-27 NOTE — Patient Instructions (Signed)
Please let me know if you develop neck symptoms due to the nodules. Please stop at the lab. Please return to see me as needed.

## 2014-09-02 ENCOUNTER — Ambulatory Visit (INDEPENDENT_AMBULATORY_CARE_PROVIDER_SITE_OTHER): Payer: Commercial Managed Care - HMO

## 2014-09-02 DIAGNOSIS — Z23 Encounter for immunization: Secondary | ICD-10-CM

## 2014-09-03 DIAGNOSIS — Z23 Encounter for immunization: Secondary | ICD-10-CM

## 2014-11-05 ENCOUNTER — Telehealth: Payer: Self-pay | Admitting: Family Medicine

## 2014-11-05 NOTE — Telephone Encounter (Signed)
Spoke with patient. Cloudy urine, painful at the end of urination. Advised for patient to go to UC instead of waiting until Monday for eval. She said she would go, but still wanted to keep Monday's scheduled appt with Dr. Damita Dunnings to discuss fatigue and anxiety.

## 2014-11-05 NOTE — Telephone Encounter (Addendum)
Noted. Agree on [facility] eval.

## 2014-11-05 NOTE — Telephone Encounter (Signed)
Pt sent my chart message  Is it ok to wait till Monday  Appointment For: [facility] ([id number])    Visit Type: MYCHART OFFICE VISIT (1064)      11/09/2014   8:30 AM 15 mins. Tonia Ghent, MD    [facility]      Patient Comments:   Office Visit   I think i have a bladder infection and i have been    dizzy and VERY tired

## 2014-11-06 DIAGNOSIS — I2510 Atherosclerotic heart disease of native coronary artery without angina pectoris: Secondary | ICD-10-CM

## 2014-11-06 HISTORY — DX: Atherosclerotic heart disease of native coronary artery without angina pectoris: I25.10

## 2014-11-07 NOTE — Telephone Encounter (Signed)
Noted, thanks!

## 2014-11-09 ENCOUNTER — Ambulatory Visit (INDEPENDENT_AMBULATORY_CARE_PROVIDER_SITE_OTHER): Payer: Commercial Managed Care - HMO | Admitting: Family Medicine

## 2014-11-09 ENCOUNTER — Encounter: Payer: Self-pay | Admitting: Family Medicine

## 2014-11-09 VITALS — BP 112/78 | HR 80 | Temp 98.0°F | Wt 146.2 lb

## 2014-11-09 DIAGNOSIS — H8110 Benign paroxysmal vertigo, unspecified ear: Secondary | ICD-10-CM

## 2014-11-09 DIAGNOSIS — F411 Generalized anxiety disorder: Secondary | ICD-10-CM | POA: Insufficient documentation

## 2014-11-09 NOTE — Progress Notes (Signed)
Pre visit review using our clinic review tool, if applicable. No additional management support is needed unless otherwise documented below in the visit note.  She was seen for UTI at [facility] and was started on cipro.  She is some better in the meantime from that standpoint.  UCx pending at [facility].    Fatigue.  Going on for months.  She'll occ get nauseated w/o vomiting, "like my stomach gets in knots".   She can occ get dizzy, with a vertiginous sx, not from presyncope.  No syncope.   The vertigo and nausea can coincide, most of the time the are together.  It will come over her "in a wave."    Prev with normal CBC and thyroid studies, since her sx started.  She thinks the anxiety is causing the sx.   Recent events on TV upset her. "This is not me, I'm usually upbeat and I don't like feeling like I feel."  "I know I can't solve all the problems in my family and in the world."  Her daughter was having troubles with a relationship.  She doesn't get to see her grandson very often.   Her granddaughter has had some relationship troubles, and that weighs on her.   No FCVD, no night sweats, no blood in stool.  No other "B" sx.    Meds, vitals, and allergies reviewed.   ROS: See HPI.  Otherwise, noncontributory.  GEN: nad, alert and oriented HEENT: mucous membranes moist NECK: supple w/o LA CV: rrr PULM: ctab, no inc wob ABD: soft, +bs EXT: no edema CN 2-12 wnl B, S/S/DTR wnl x4 Vertigo sx with head turning, esp to L side.

## 2014-11-09 NOTE — Patient Instructions (Signed)
Try the bedside exercises for vertigo and try taking meclizine 12.5-25mg a day as needed.  Susan [name] will call about your referral.  See if you can get set up with counseling.  Take care.  Let me know if you aren't improved.  Glad to see you.

## 2014-11-10 NOTE — Assessment & Plan Note (Signed)
Likely incidental dx, d/w pt about path/phys, meclizine use, and bedside exercise.  F/u prn. She agrees.

## 2014-11-10 NOTE — Assessment & Plan Note (Signed)
D/w pt.  Okay for outpatient f/u.  Would be reasonable to get set up with counseling, defer meds for now.  She agrees.  Referral placed.  >25 minutes spent in face to face time with patient, >50% spent in counselling or coordination of care.

## 2014-11-17 ENCOUNTER — Telehealth: Payer: Self-pay | Admitting: *Deleted

## 2014-11-17 DIAGNOSIS — Z8739 Personal history of other diseases of the musculoskeletal system and connective tissue: Secondary | ICD-10-CM

## 2014-11-17 NOTE — Telephone Encounter (Signed)
Spoke with patient concerning bone density results.  It is recommended that the bone density be repeated in November 2016.  Patient asks if she could get the bone density test scheduled for the same day that she is scheduled for her next mammogram which is September 23, 2015 at 9:10 AM at [facility]?  That is how the last one was scheduled and it worked well for her.  Patient also asks if she should be taking Calcium and also if she should be taking a higher dose of Vitamin D in the meantime?

## 2014-11-19 NOTE — Telephone Encounter (Signed)
Ordered. I would continue as is with her calcium and vit D.   Susan [name] is backed up on referrals, so it will take a while to get scheduled but this shouldn't be a problem. Thanks.

## 2014-11-19 NOTE — Telephone Encounter (Signed)
Patient advised.

## 2015-03-14 ENCOUNTER — Other Ambulatory Visit: Payer: Self-pay | Admitting: Family Medicine

## 2015-03-14 DIAGNOSIS — M810 Age-related osteoporosis without current pathological fracture: Secondary | ICD-10-CM

## 2015-03-14 DIAGNOSIS — R739 Hyperglycemia, unspecified: Secondary | ICD-10-CM

## 2015-03-14 DIAGNOSIS — I6523 Occlusion and stenosis of bilateral carotid arteries: Secondary | ICD-10-CM

## 2015-03-14 DIAGNOSIS — E042 Nontoxic multinodular goiter: Secondary | ICD-10-CM

## 2015-03-18 ENCOUNTER — Other Ambulatory Visit (INDEPENDENT_AMBULATORY_CARE_PROVIDER_SITE_OTHER): Payer: Commercial Managed Care - HMO

## 2015-03-18 DIAGNOSIS — M810 Age-related osteoporosis without current pathological fracture: Secondary | ICD-10-CM

## 2015-03-18 DIAGNOSIS — E042 Nontoxic multinodular goiter: Secondary | ICD-10-CM | POA: Diagnosis not present

## 2015-03-18 DIAGNOSIS — R739 Hyperglycemia, unspecified: Secondary | ICD-10-CM | POA: Diagnosis not present

## 2015-03-18 LAB — COMPREHENSIVE METABOLIC PANEL
ALK PHOS: 47 U/L (ref 39–117)
ALT: 16 U/L (ref 0–35)
AST: 19 U/L (ref 0–37)
Albumin: 3.9 g/dL (ref 3.5–5.2)
BILIRUBIN TOTAL: 0.5 mg/dL (ref 0.2–1.2)
BUN: 20 mg/dL (ref 6–23)
CO2: 32 mEq/L (ref 19–32)
Calcium: 9.3 mg/dL (ref 8.4–10.5)
Chloride: 104 mEq/L (ref 96–112)
Creatinine, Ser: 0.66 mg/dL (ref 0.40–1.20)
GFR: 94.18 mL/min (ref >60.00)
Glucose, Bld: 101 mg/dL — ABNORMAL HIGH (ref 70–99)
Potassium: 4.1 mEq/L (ref 3.5–5.1)
Sodium: 142 mEq/L (ref 135–145)
TOTAL PROTEIN: 6.9 g/dL (ref 6.0–8.3)

## 2015-03-18 LAB — LIPID PANEL
CHOL/HDL RATIO: 3
Cholesterol: 169 mg/dL (ref 0–200)
HDL: 49 mg/dL (ref >39.00)
LDL CALC: 98 mg/dL (ref 0–99)
NONHDL: 120
Triglycerides: 112 mg/dL (ref 0.0–149.0)
VLDL: 22.4 mg/dL (ref 0.0–40.0)

## 2015-03-18 LAB — VITAMIN D 25 HYDROXY (VIT D DEFICIENCY, FRACTURES): VITD: 24.34 ng/mL — ABNORMAL LOW (ref 30.00–100.00)

## 2015-03-18 LAB — TSH: TSH: 2.36 u[IU]/mL (ref 0.35–4.50)

## 2015-03-22 ENCOUNTER — Ambulatory Visit (INDEPENDENT_AMBULATORY_CARE_PROVIDER_SITE_OTHER): Payer: Commercial Managed Care - HMO | Admitting: Family Medicine

## 2015-03-22 ENCOUNTER — Encounter: Payer: Self-pay | Admitting: Family Medicine

## 2015-03-22 VITALS — BP 104/64 | HR 74 | Temp 98.5°F | Ht 61.0 in | Wt 151.5 lb

## 2015-03-22 DIAGNOSIS — M720 Palmar fascial fibromatosis [Dupuytren]: Secondary | ICD-10-CM

## 2015-03-22 DIAGNOSIS — Z0000 Encounter for general adult medical examination without abnormal findings: Secondary | ICD-10-CM

## 2015-03-22 DIAGNOSIS — E559 Vitamin D deficiency, unspecified: Secondary | ICD-10-CM | POA: Diagnosis not present

## 2015-03-22 DIAGNOSIS — Z23 Encounter for immunization: Secondary | ICD-10-CM | POA: Diagnosis not present

## 2015-03-22 DIAGNOSIS — Z7189 Other specified counseling: Secondary | ICD-10-CM

## 2015-03-22 DIAGNOSIS — M810 Age-related osteoporosis without current pathological fracture: Secondary | ICD-10-CM

## 2015-03-22 MED ORDER — VITAMIN D (ERGOCALCIFEROL) 1.25 MG (50000 UNIT) PO CAPS: 50000.0000 [IU] | ORAL_CAPSULE | ORAL | Status: DC

## 2015-03-22 NOTE — Patient Instructions (Addendum)
Check with your insurance to see if they will cover the shingles shot. Rosaria Ferries will call about your referral. 50000 units of vit D once a week.  Recheck lab in about 3 months. If normal, then restart your prev dose.  Take care. Glad to see you.

## 2015-03-22 NOTE — Progress Notes (Signed)
Pre visit review using our clinic review tool, if applicable. No additional management support is needed unless otherwise documented below in the visit note.  I have personally reviewed the Medicare Annual Wellness questionnaire and have noted 1. The patient's medical and social history 2. Their use of alcohol, tobacco or illicit drugs 3. Their current medications and supplements 4. The patient's functional ability including ADL's, fall risks, home safety risks and hearing or visual             impairment. 5. Diet and physical activities 6. Evidence for depression or mood disorders  The patients weight, height, BMI have been recorded in the chart and visual acuity is per eye clinic.  I have made referrals, counseling and provided education to the patient based review of the above and I have provided the pt with a written personalized care plan for preventive services.  Provider list updated- see scanned forms.  Routine anticipatory guidance given to patient.  See health maintenance.  Flu 2015 Shingles dw pt.  PNA 2011 Tetanus 2011 Colonoscopy 2012 Breast cancer screening- scheduled mammogram, pending.  DXA pending, scheduled.  Advance directive- husband then youngest daughter Thressa Sheller designated if patient were incapacitated.  Cognitive function addressed- see scanned forms- and if abnormal then additional documentation follows.   PMH and SH reviewed  Meds, vitals, and allergies reviewed.   ROS: See HPI.  Otherwise negative.    GEN: nad, alert and oriented HEENT: mucous membranes moist NECK: supple w/o LA CV: rrr. PULM: ctab, no inc wob ABD: soft, +bs EXT: no edema SKIN: no acute rash L hand dupuytren's contracture noted.

## 2015-03-24 DIAGNOSIS — Z7189 Other specified counseling: Secondary | ICD-10-CM | POA: Insufficient documentation

## 2015-03-24 DIAGNOSIS — M720 Palmar fascial fibromatosis [Dupuytren]: Secondary | ICD-10-CM | POA: Insufficient documentation

## 2015-03-24 NOTE — Assessment & Plan Note (Signed)
Refer.  D/w pt.

## 2015-03-24 NOTE — Assessment & Plan Note (Signed)
Flu 2015 Shingles dw pt.  PNA 2011 Tetanus 2011 Colonoscopy 2012 Breast cancer screening- scheduled mammogram, pending.  DXA pending, scheduled.  Advance directive- husband then youngest daughter Thressa Sheller designated if patient were incapacitated.  Cognitive function addressed- see scanned forms- and if abnormal then additional documentation follows.

## 2015-03-24 NOTE — Assessment & Plan Note (Signed)
Low vit D, start 50000 units of vit D once a week. Recheck lab in about 3 months. If normal, then restart prev dose.  She agrees.

## 2015-03-30 DIAGNOSIS — H35363 Drusen (degenerative) of macula, bilateral: Secondary | ICD-10-CM | POA: Diagnosis not present

## 2015-04-01 DIAGNOSIS — H5210 Myopia, unspecified eye: Secondary | ICD-10-CM | POA: Diagnosis not present

## 2015-04-01 DIAGNOSIS — H524 Presbyopia: Secondary | ICD-10-CM | POA: Diagnosis not present

## 2015-04-22 DIAGNOSIS — M65332 Trigger finger, left middle finger: Secondary | ICD-10-CM | POA: Diagnosis not present

## 2015-05-18 ENCOUNTER — Encounter: Payer: Self-pay | Admitting: Family Medicine

## 2015-05-18 ENCOUNTER — Ambulatory Visit (INDEPENDENT_AMBULATORY_CARE_PROVIDER_SITE_OTHER): Payer: Commercial Managed Care - HMO | Admitting: Family Medicine

## 2015-05-18 VITALS — BP 128/80 | HR 85 | Temp 98.7°F | Wt 147.5 lb

## 2015-05-18 DIAGNOSIS — N8110 Cystocele, unspecified: Secondary | ICD-10-CM

## 2015-05-18 DIAGNOSIS — IMO0002 Reserved for concepts with insufficient information to code with codable children: Secondary | ICD-10-CM

## 2015-05-18 NOTE — Patient Instructions (Signed)
Cystocele.  Rosaria Ferries will call about your referral to [facility].  Take care.  Glad to see you.

## 2015-05-18 NOTE — Progress Notes (Signed)
Pre visit review using our clinic review tool, if applicable. No additional management support is needed unless otherwise documented below in the visit note.  She was itching and scratched her groin.  Noted some scant blood at that point, she attributed to scratching.  This wasn't bleeding like she had a period. She was checking herself and noted a skin lesion.  Not painful.  The lesions isn't hard.  "Now that I know it is there, I can feel it (ie is aware of it).  H/o BTL.   She was scared about the spot and wanted it checked.  No FCNAVD.  She feels okay o/w.  Meds, vitals, and allergies reviewed.   ROS: See HPI.  Otherwise, noncontributory.  nad Chaperoned exam Normal ext genitalia.  Cervix wnl No vaginal lesions but cystocele noted No discharge

## 2015-05-19 DIAGNOSIS — IMO0002 Reserved for concepts with insufficient information to code with codable children: Secondary | ICD-10-CM | POA: Insufficient documentation

## 2015-05-19 NOTE — Assessment & Plan Note (Signed)
She has stress incontinence.  The lesion she noted is likely just the cystocele and nothing ominous.  D/w pt.  Would have her see urology and she agrees. Referred.  F/u prn.

## 2015-05-20 DIAGNOSIS — M65332 Trigger finger, left middle finger: Secondary | ICD-10-CM | POA: Diagnosis not present

## 2015-06-22 ENCOUNTER — Other Ambulatory Visit (INDEPENDENT_AMBULATORY_CARE_PROVIDER_SITE_OTHER): Payer: Commercial Managed Care - HMO

## 2015-06-22 DIAGNOSIS — E559 Vitamin D deficiency, unspecified: Secondary | ICD-10-CM

## 2015-06-22 LAB — VITAMIN D 25 HYDROXY (VIT D DEFICIENCY, FRACTURES): VITD: 33.62 ng/mL (ref 30.00–100.00)

## 2015-06-23 ENCOUNTER — Other Ambulatory Visit: Payer: Self-pay | Admitting: Family Medicine

## 2015-06-23 MED ORDER — CHOLECALCIFEROL 50 MCG (2000 UT) PO CAPS: 2000.0000 [IU] | ORAL_CAPSULE | Freq: Every day | ORAL | Status: DC

## 2015-07-06 DIAGNOSIS — N952 Postmenopausal atrophic vaginitis: Secondary | ICD-10-CM | POA: Diagnosis not present

## 2015-07-06 DIAGNOSIS — N8111 Cystocele, midline: Secondary | ICD-10-CM | POA: Diagnosis not present

## 2015-07-06 DIAGNOSIS — R3915 Urgency of urination: Secondary | ICD-10-CM | POA: Diagnosis not present

## 2015-08-30 ENCOUNTER — Emergency Department: Payer: Commercial Managed Care - HMO

## 2015-08-30 ENCOUNTER — Telehealth: Payer: Self-pay | Admitting: Family Medicine

## 2015-08-30 ENCOUNTER — Emergency Department
Admission: EM | Admit: 2015-08-30 | Discharge: 2015-08-30 | Disposition: A | Discharge status: Home / Self Care | Payer: Commercial Managed Care - HMO | Attending: Emergency Medicine | Admitting: Emergency Medicine

## 2015-08-30 DIAGNOSIS — F419 Anxiety disorder, unspecified: Secondary | ICD-10-CM | POA: Insufficient documentation

## 2015-08-30 DIAGNOSIS — I10 Essential (primary) hypertension: Secondary | ICD-10-CM | POA: Insufficient documentation

## 2015-08-30 DIAGNOSIS — Z79899 Other long term (current) drug therapy: Secondary | ICD-10-CM | POA: Insufficient documentation

## 2015-08-30 DIAGNOSIS — R42 Dizziness and giddiness: Secondary | ICD-10-CM | POA: Insufficient documentation

## 2015-08-30 DIAGNOSIS — Z7982 Long term (current) use of aspirin: Secondary | ICD-10-CM | POA: Insufficient documentation

## 2015-08-30 DIAGNOSIS — Z880 Allergy status to penicillin: Secondary | ICD-10-CM | POA: Insufficient documentation

## 2015-08-30 DIAGNOSIS — Z87891 Personal history of nicotine dependence: Secondary | ICD-10-CM | POA: Diagnosis not present

## 2015-08-30 DIAGNOSIS — R0789 Other chest pain: Secondary | ICD-10-CM | POA: Diagnosis not present

## 2015-08-30 DIAGNOSIS — R079 Chest pain, unspecified: Secondary | ICD-10-CM | POA: Insufficient documentation

## 2015-08-30 LAB — BASIC METABOLIC PANEL
ANION GAP: 7 (ref 5–15)
BUN: 15 mg/dL (ref 6–20)
CO2: 25 mmol/L (ref 22–32)
Calcium: 8.9 mg/dL (ref 8.9–10.3)
Chloride: 108 mmol/L (ref 101–111)
Creatinine, Ser: 0.64 mg/dL (ref 0.44–1.00)
GFR calc Af Amer: >60 mL/min (ref >60)
GFR calc non Af Amer: >60 mL/min (ref >60)
GLUCOSE: 112 mg/dL — AB (ref 65–99)
POTASSIUM: 3.4 mmol/L — AB (ref 3.5–5.1)
SODIUM: 140 mmol/L (ref 135–145)

## 2015-08-30 LAB — TROPONIN I: Troponin I: <0.03 ng/mL (ref <0.031)

## 2015-08-30 LAB — CBC
HEMATOCRIT: 40.7 % (ref 35.0–47.0)
HEMOGLOBIN: 14.1 g/dL (ref 12.0–16.0)
MCH: 31.9 pg (ref 26.0–34.0)
MCHC: 34.7 g/dL (ref 32.0–36.0)
MCV: 91.9 fL (ref 80.0–100.0)
Platelets: 169 10*3/uL (ref 150–440)
RBC: 4.42 MIL/uL (ref 3.80–5.20)
RDW: 13.3 % (ref 11.5–14.5)
WBC: 5.5 10*3/uL (ref 3.6–11.0)

## 2015-08-30 NOTE — Discharge Instructions (Signed)
Nonspecific Chest Pain  °Chest pain can be caused by many different conditions. There is always a chance that your pain could be related to something serious, such as a heart attack or a blood clot in your lungs. Chest pain can also be caused by conditions that are not life-threatening. If you have chest pain, it is very important to follow up with your health care provider. °CAUSES  °Chest pain can be caused by: °· Heartburn. °· Pneumonia or bronchitis. °· Anxiety or stress. °· Inflammation around your heart (pericarditis) or lung (pleuritis or pleurisy). °· A blood clot in your lung. °· A collapsed lung (pneumothorax). It can develop suddenly on its own (spontaneous pneumothorax) or from trauma to the chest. °· Shingles infection (varicella-zoster virus). °· Heart attack. °· Damage to the bones, muscles, and cartilage that make up your chest wall. This can include: °¨ Bruised bones due to injury. °¨ Strained muscles or cartilage due to frequent or repeated coughing or overwork. °¨ Fracture to one or more ribs. °¨ Sore cartilage due to inflammation (costochondritis). °RISK FACTORS  °Risk factors for chest pain may include: °· Activities that increase your risk for trauma or injury to your chest. °· Respiratory infections or conditions that cause frequent coughing. °· Medical conditions or overeating that can cause heartburn. °· Heart disease or family history of heart disease. °· Conditions or health behaviors that increase your risk of developing a blood clot. °· Having had chicken pox (varicella zoster). °SIGNS AND SYMPTOMS °Chest pain can feel like: °· Burning or tingling on the surface of your chest or deep in your chest. °· Crushing, pressure, aching, or squeezing pain. °· Dull or sharp pain that is worse when you move, cough, or take a deep breath. °· Pain that is also felt in your back, neck, shoulder, or arm, or pain that spreads to any of these areas. °Your chest pain may come and go, or it may stay  constant. °DIAGNOSIS °Lab tests or other studies may be needed to find the cause of your pain. Your health care provider may have you take a test called an ambulatory ECG (electrocardiogram). An ECG records your heartbeat patterns at the time the test is performed. You may also have other tests, such as: °· Transthoracic echocardiogram (TTE). During echocardiography, sound waves are used to create a picture of all of the heart structures and to look at how blood flows through your heart. °· Transesophageal echocardiogram (TEE). This is a more advanced imaging test that obtains images from inside your body. It allows your health care provider to see your heart in finer detail. °· Cardiac monitoring. This allows your health care provider to monitor your heart rate and rhythm in real time. °· Holter monitor. This is a portable device that records your heartbeat and can help to diagnose abnormal heartbeats. It allows your health care provider to track your heart activity for several days, if needed. °· Stress tests. These can be done through exercise or by taking medicine that makes your heart beat more quickly. °· Blood tests. °· Imaging tests. °TREATMENT  °Your treatment depends on what is causing your chest pain. Treatment may include: °· Medicines. These may include: °¨ Acid blockers for heartburn. °¨ Anti-inflammatory medicine. °¨ Pain medicine for inflammatory conditions. °¨ Antibiotic medicine, if an infection is present. °¨ Medicines to dissolve blood clots. °¨ Medicines to treat coronary artery disease. °· Supportive care for conditions that do not require medicines. This may include: °¨ Resting. °¨ Applying heat   or cold packs to injured areas. °¨ Limiting activities until pain decreases. °HOME CARE INSTRUCTIONS °· If you were prescribed an antibiotic medicine, finish it all even if you start to feel better. °· Avoid any activities that bring on chest pain. °· Do not use any tobacco products, including  cigarettes, chewing tobacco, or electronic cigarettes. If you need help quitting, ask your health care provider. °· Do not drink alcohol. °· Take medicines only as directed by your health care provider. °· Keep all follow-up visits as directed by your health care provider. This is important. This includes any further testing if your chest pain does not go away. °· If heartburn is the cause for your chest pain, you may be told to keep your head raised (elevated) while sleeping. This reduces the chance that acid will go from your stomach into your esophagus. °· Make lifestyle changes as directed by your health care provider. These may include: °¨ Getting regular exercise. Ask your health care provider to suggest some activities that are safe for you. °¨ Eating a heart-healthy diet. A registered dietitian can help you to learn healthy eating options. °¨ Maintaining a healthy weight. °¨ Managing diabetes, if necessary. °¨ Reducing stress. °SEEK MEDICAL CARE IF: °· Your chest pain does not go away after treatment. °· You have a rash with blisters on your chest. °· You have a fever. °SEEK IMMEDIATE MEDICAL CARE IF:  °· Your chest pain is worse. °· You have an increasing cough, or you cough up blood. °· You have severe abdominal pain. °· You have severe weakness. °· You faint. °· You have chills. °· You have sudden, unexplained chest discomfort. °· You have sudden, unexplained discomfort in your arms, back, neck, or jaw. °· You have shortness of breath at any time. °· You suddenly start to sweat, or your skin gets clammy. °· You feel nauseous or you vomit. °· You suddenly feel light-headed or dizzy. °· Your heart begins to beat quickly, or it feels like it is skipping beats. °These symptoms may represent a serious problem that is an emergency. Do not wait to see if the symptoms will go away. Get medical help right away. Call your local emergency services (911 in the [place] Do not drive yourself to the hospital. °  °This  information is not intended to replace advice given to you by your health care provider. Make sure you discuss any questions you have with your health care provider. °  °Document Released: 08/02/2005 Document Revised: 11/13/2014 Document Reviewed: 05/29/2014 °Elsevier Interactive Patient Education ©2016 [employer] ° °

## 2015-08-30 NOTE — ED Notes (Signed)
Pt presents with chest tightness started today and feels like she is being pulled to the left.

## 2015-08-30 NOTE — ED Provider Notes (Signed)
[facility] Emergency Department Provider Note  ____________________________________________  Time seen: 4 PM  I have reviewed the triage vital signs and the nursing notes.   HISTORY  Chief Complaint Chest Pain    HPI Susan [name] is a 69 y.o. female who presents with complains of chest tightness which is mild to moderate over the last 2 weeks. She believes it is related to anxiety and she specifically mentioned stress related to the election. No shortness of breath. No recent travel. No calf pain. No lower extremity swelling. No nausea vomiting or diaphoresis. She is unable to determine pattern to the chest tightness. She feels well in the emergency department. She has no history of heart disease. She does not smoke. She reports her cholesterol is good. Typically her blood pressure is well-controlled. She also describes that she occasionally gets dizzy and feels like she was going to fall over but never does. This is been happening for approximately 2 months. She denies weakness in extremities or difficulty speaking     Past Medical History  Diagnosis Date  . Osteoporosis     on Fosamax for 5 years  . Diverticulosis of colon   . Other chronic nonalcoholic liver disease     LFTs normalized with weight loss 2013  . Colon cancer screening   . Other screening mammogram   . Other abnormal glucose   . Urinary tract infection, site not specified   . Migraine, unspecified, without mention of intractable migraine without mention of status migrainosus   . Symptomatic menopausal or female climacteric states   . Ocular migraine     Patient Active Problem List   Diagnosis Date Noted  . Cystocele 05/19/2015  . Dupuytren contracture 03/24/2015  . Advance care planning 03/24/2015  . Benign paroxysmal positional vertigo 11/09/2014  . Anxiety state 11/09/2014  . Other fatigue 08/27/2014  . Multiple joint pain 07/09/2014  . Medicare annual wellness visit,  initial 08/25/2013  . Abdominal pain, other specified site 08/25/2013  . Multiple thyroid nodules 08/06/2013  . Bilateral carotid artery stenosis 05/22/2013  . Memory changes 08/27/2012  . Lipoma 05/05/2011  . Disordered eating 05/05/2011  . VITAMIN B12 DEFICIENCY 11/18/2010  . IRRITABLE BOWEL SYNDROME 11/11/2010  . DIVERTICULOSIS, COLON 11/04/2010  . OTHER CHRONIC NONALCOHOLIC LIVER DISEASE 65/53/7482  . COLONIC POLYPS, HX OF 11/04/2010  . Hyperglycemia 08/12/2010  . MIGRAINE HEADACHE 12/27/2007  . HYPERTENSION 12/27/2007  . MENOPAUSAL SYNDROME 12/27/2007  . Osteoporosis 11/05/2007  . LIVER FUNCTION TESTS, ABNORMAL, HX OF 05/06/2006    Past Surgical History  Procedure Laterality Date  . Btl  30+ years ago  . Tonsillectomy and adenoidectomy  Age 19  . Ankle fracture surgery  ~2002    pinning  . Abdominal ultrasound  04/12/2004 & 6/06    positive gallstones  . Ct abdomen  04/29/04    Positive gallstones  . Ultrasound pelvis  6/06    Negative  . Colonoscopy  06/08/06    Polyps, divertics (Dr. Sharlett Iles)  . Cataract extraction  2013    B eyes    Current Outpatient Rx  Name  Route  Sig  Dispense  Refill  . acetaminophen (TYLENOL) 500 MG tablet   Oral   Take 1,000 mg by mouth 2 (two) times daily.         Marland Kitchen aspirin 325 MG tablet   Oral   Take 325 mg by mouth daily as needed.          . Cholecalciferol (  [employer] VITAMIN D) 2000 UNITS CAPS   Oral   Take 1 capsule (2,000 Units total) by mouth daily.   30 each      . vitamin B-12 (CYANOCOBALAMIN) 1000 MCG tablet   Oral   Take 1,000 mcg by mouth daily.           Allergies Penicillins and Sulfonamide derivatives  Family History  Problem Relation Age of Onset  . Depression Mother     Manic depression  . Diabetes Mother     Type 2  . Breast cancer Mother   . Colon polyps Father   . Heart disease Father   . Cancer Neg Hx   . Colon cancer Neg Hx   . Breast cancer Maternal Aunt   . Breast cancer Paternal Aunt      Social History Social History  Substance Use Topics  . Smoking status: Former Smoker    Types: Cigarettes  . Smokeless tobacco: Never Used     Comment: Quit in 2001  . Alcohol Use: Yes     Comment: Occasional    Review of Systems  Constitutional: Negative for fever. Eyes: Negative for visual changes. ENT: Negative for sore throat Cardiovascular: Negative for chest pain in the emergency department Respiratory: Negative for shortness of breath. Gastrointestinal: Negative for abdominal pain, vomiting and diarrhea. Genitourinary: Negative for dysuria. Musculoskeletal: Negative for back pain. Skin: Negative for rash. Neurological: Negative for headaches or focal weakness Psychiatric: Mild anxiety    ____________________________________________   PHYSICAL EXAM:  VITAL SIGNS: ED Triage Vitals  Enc Vitals Group     BP 08/30/15 1533 180/81 mmHg     Pulse Rate 08/30/15 1533 94     Resp 08/30/15 1533 16     Temp 08/30/15 1533 98.5 F (36.9 C)     Temp Source 08/30/15 1533 Oral     SpO2 08/30/15 1533 96 %     Weight 08/30/15 1533 150 lb (68.04 kg)     Height 08/30/15 1533 5' 1"  (1.549 m)     Head Cir --      Peak Flow --      Pain Score --      Pain Loc --      Pain Edu? --      Excl. in [place]? --      Constitutional: Alert and oriented. Well appearing and in no distress. Eyes: Conjunctivae are normal.  ENT   Head: Normocephalic and atraumatic.   Mouth/Throat: Mucous membranes are moist. Cardiovascular: Normal rate, regular rhythm. Normal and symmetric distal pulses are present in all extremities. No murmurs, rubs, or gallops. Respiratory: Normal respiratory effort without tachypnea nor retractions. Breath sounds are clear and equal bilaterally.  Gastrointestinal: Soft and non-tender in all quadrants. No distention. There is no CVA tenderness. Genitourinary: deferred Musculoskeletal: Nontender with normal range of motion in all extremities. No lower  extremity tenderness nor edema. Neurologic:  Normal speech and language. No gross focal neurologic deficits are appreciated. Cranial nerves II through XII are intact Skin:  Skin is warm, dry and intact. No rash noted. Psychiatric: Mood and affect are normal. Patient exhibits appropriate insight and judgment.  ____________________________________________    LABS (pertinent positives/negatives)  Labs Reviewed  BASIC METABOLIC PANEL - Abnormal; Notable for the following:    Potassium 3.4 (*)    Glucose, Bld 112 (*)    All other components within normal limits  TROPONIN I  CBC  TROPONIN I    ____________________________________________   EKG  ED  ECG REPORT I, Lavonia Drafts, the attending physician, personally viewed and interpreted this ECG.   Date: 08/30/2015  EKG Time: 3:30 PM  Rate: 96  Rhythm: normal sinus rhythm  Axis: Normal  Intervals:Incomplete right bundle branch block  ST&T Change: None   ____________________________________________    RADIOLOGY I have personally reviewed any xrays that were ordered on this patient: Chest x-ray shows no acute abnormalities, discussed deviation of trachea with patient and she reports she currently has her thyroid checked  ____________________________________________   PROCEDURES  Procedure(s) performed: none  Critical Care performed: none  ____________________________________________   INITIAL IMPRESSION / ASSESSMENT AND PLAN / ED COURSE  Pertinent labs & imaging results that were available during my care of the patient were reviewed by me and considered in my medical decision making (see chart for details).  Patient presents with intermittent chest tightness over the last 2 weeks. She believes there may be an element of stress to this which is possible however I have told her we need to rule out other etiologies first. Her EKG shows no acute abnormalities and her initial cardiac enzymes normal. We will recheck this  and if normal and she feels well we will have her follow-up with cardiology within 48 hours for stress test. Patient agrees with the plan and knows to return if any return of her symptoms ____________________________________________   FINAL CLINICAL IMPRESSION(S) / ED DIAGNOSES  Final diagnoses:  Chest pain, unspecified chest pain type     Lavonia Drafts, MD 08/30/15 2253

## 2015-08-30 NOTE — Telephone Encounter (Signed)
Patient Name: Susan [name]  DOB: 1946-01-08    Initial Comment Caller states she has chest tightness.   Nurse Assessment  Nurse: Raphael Gibney, RN, Vanita Ingles Date/Time Eilene Ghazi Time): 08/30/2015 2:18:05 PM  Confirm and document reason for call. If symptomatic, describe symptoms. ---Caller states she is not wheezing. Chest feels "tight". No cough. She is more tired than usual. No fever. She can take a deep breath. No pain when she takes a deep breath. Has been under a lot of stress.  Has the patient traveled out of the country within the last 30 days? ---Not Applicable  Does the patient have any new or worsening symptoms? ---Yes  Will a triage be completed? ---Yes  Related visit to physician within the last 2 weeks? ---No  Does the PT have any chronic conditions? (i.e. diabetes, asthma, etc.) ---Yes  List chronic conditions. ---osteoarthritis     Guidelines    Guideline Title Affirmed Question Affirmed Notes  Chest Pain Chest pain lasts > 5 minutes (Exceptions: chest pain occurring > 3 days ago and now asymptomatic; same as previously diagnosed heartburn and has accompanying sour taste in mouth)    Final Disposition User   Go to ED Now (or PCP triage) Raphael Gibney, RN, [place]    Comments  Advised caller to go ahead and proceed to the ER rather than making a doctor's appt as her chest has been tight today.   Referrals  [facility] - ED   Disagree/Comply: Comply

## 2015-08-31 ENCOUNTER — Telehealth: Payer: Self-pay | Admitting: Family Medicine

## 2015-08-31 DIAGNOSIS — R0789 Other chest pain: Secondary | ICD-10-CM

## 2015-08-31 NOTE — Telephone Encounter (Signed)
Pt went to [facility] er 08/30/15.  They told her she needs to see cardiologist. She called dr Humphrey Rolls office this morning they will not make her an appointment with out a referral Need referral in system ER doc told her she needs to see cardiologist within  48 hours

## 2015-08-31 NOTE — Telephone Encounter (Signed)
Ordered.  Thanks.  Since her sx have been going on for 2 weeks, as long as her sx don't increase then she may not need to have stress test done in 48 hours.

## 2015-08-31 NOTE — Telephone Encounter (Signed)
Pt aware. Appt scheduled with Dr. Humphrey Rolls this Thursday 10/27 @ 1:15pm

## 2015-08-31 NOTE — Telephone Encounter (Signed)
Thanks

## 2015-09-02 DIAGNOSIS — R079 Chest pain, unspecified: Secondary | ICD-10-CM | POA: Diagnosis not present

## 2015-09-02 DIAGNOSIS — I10 Essential (primary) hypertension: Secondary | ICD-10-CM | POA: Diagnosis not present

## 2015-09-02 DIAGNOSIS — R55 Syncope and collapse: Secondary | ICD-10-CM | POA: Diagnosis not present

## 2015-09-02 DIAGNOSIS — R0602 Shortness of breath: Secondary | ICD-10-CM | POA: Diagnosis not present

## 2015-09-06 DIAGNOSIS — R55 Syncope and collapse: Secondary | ICD-10-CM | POA: Diagnosis not present

## 2015-09-06 DIAGNOSIS — R0602 Shortness of breath: Secondary | ICD-10-CM | POA: Diagnosis not present

## 2015-09-06 DIAGNOSIS — R079 Chest pain, unspecified: Secondary | ICD-10-CM | POA: Diagnosis not present

## 2015-09-06 DIAGNOSIS — I10 Essential (primary) hypertension: Secondary | ICD-10-CM | POA: Diagnosis not present

## 2015-09-07 DIAGNOSIS — I10 Essential (primary) hypertension: Secondary | ICD-10-CM | POA: Diagnosis not present

## 2015-09-07 DIAGNOSIS — R55 Syncope and collapse: Secondary | ICD-10-CM | POA: Diagnosis not present

## 2015-09-07 DIAGNOSIS — I340 Nonrheumatic mitral (valve) insufficiency: Secondary | ICD-10-CM | POA: Diagnosis not present

## 2015-09-07 DIAGNOSIS — R0602 Shortness of breath: Secondary | ICD-10-CM | POA: Diagnosis not present

## 2015-09-07 DIAGNOSIS — R079 Chest pain, unspecified: Secondary | ICD-10-CM | POA: Diagnosis not present

## 2015-09-12 ENCOUNTER — Other Ambulatory Visit: Payer: Self-pay | Admitting: Family Medicine

## 2015-09-12 DIAGNOSIS — M810 Age-related osteoporosis without current pathological fracture: Secondary | ICD-10-CM

## 2015-09-12 NOTE — Progress Notes (Signed)
Call pt.  Due for f/u DXA.  I put in the order.  Thanks.

## 2015-09-13 NOTE — Progress Notes (Signed)
Patient notified as instructed by telephone and verbalized understanding. Patient stated that she already has an appointment scheduled for the DXA on 09/23/15.

## 2015-09-14 DIAGNOSIS — R9430 Abnormal result of cardiovascular function study, unspecified: Secondary | ICD-10-CM | POA: Diagnosis not present

## 2015-09-23 ENCOUNTER — Encounter: Payer: Self-pay | Admitting: Family Medicine

## 2015-09-23 DIAGNOSIS — Z1231 Encounter for screening mammogram for malignant neoplasm of breast: Secondary | ICD-10-CM | POA: Diagnosis not present

## 2015-09-23 DIAGNOSIS — M8588 Other specified disorders of bone density and structure, other site: Secondary | ICD-10-CM | POA: Diagnosis not present

## 2015-09-23 DIAGNOSIS — M85852 Other specified disorders of bone density and structure, left thigh: Secondary | ICD-10-CM | POA: Diagnosis not present

## 2015-09-23 DIAGNOSIS — M85862 Other specified disorders of bone density and structure, left lower leg: Secondary | ICD-10-CM | POA: Diagnosis not present

## 2015-09-23 DIAGNOSIS — M810 Age-related osteoporosis without current pathological fracture: Secondary | ICD-10-CM | POA: Diagnosis not present

## 2015-09-24 DIAGNOSIS — I351 Nonrheumatic aortic (valve) insufficiency: Secondary | ICD-10-CM | POA: Diagnosis not present

## 2015-09-24 DIAGNOSIS — I2510 Atherosclerotic heart disease of native coronary artery without angina pectoris: Secondary | ICD-10-CM | POA: Diagnosis not present

## 2015-09-24 DIAGNOSIS — I340 Nonrheumatic mitral (valve) insufficiency: Secondary | ICD-10-CM | POA: Diagnosis not present

## 2015-09-24 DIAGNOSIS — E785 Hyperlipidemia, unspecified: Secondary | ICD-10-CM | POA: Diagnosis not present

## 2015-09-24 DIAGNOSIS — I10 Essential (primary) hypertension: Secondary | ICD-10-CM | POA: Diagnosis not present

## 2015-10-05 ENCOUNTER — Encounter: Payer: Self-pay | Admitting: Family Medicine

## 2015-10-06 ENCOUNTER — Encounter: Payer: Self-pay | Admitting: *Deleted

## 2015-10-07 DIAGNOSIS — I38 Endocarditis, valve unspecified: Secondary | ICD-10-CM

## 2015-10-07 HISTORY — DX: Endocarditis, valve unspecified: I38

## 2015-10-15 ENCOUNTER — Ambulatory Visit (INDEPENDENT_AMBULATORY_CARE_PROVIDER_SITE_OTHER): Payer: Commercial Managed Care - HMO | Admitting: Primary Care

## 2015-10-15 ENCOUNTER — Encounter: Payer: Self-pay | Admitting: Primary Care

## 2015-10-15 VITALS — BP 122/72 | HR 86 | Temp 97.8°F | Ht 61.0 in | Wt 153.8 lb

## 2015-10-15 DIAGNOSIS — R109 Unspecified abdominal pain: Secondary | ICD-10-CM | POA: Diagnosis not present

## 2015-10-15 LAB — POCT URINALYSIS DIPSTICK
BILIRUBIN UA: NEGATIVE
Blood, UA: NEGATIVE
GLUCOSE UA: NEGATIVE
Ketones, UA: NEGATIVE
LEUKOCYTES UA: NEGATIVE
Nitrite, UA: NEGATIVE
Protein, UA: NEGATIVE
Spec Grav, UA: 1.015
Urobilinogen, UA: NEGATIVE
pH, UA: 6

## 2015-10-15 NOTE — Progress Notes (Signed)
Subjective:    Patient ID: Susan [name], female    DOB: Nov 30, 1945, 69 y.o.   MRN: [id number]  HPI  Susan [name] is a 69 year old female who presents today with a chief complaint of flank pain. Her pain has been present for the past 2 days and is located to bilateral sides. Denies dysuria, hematuria, fevers, frequency, recent injury. She describes her pain as a dull ache. She pain is worse with twisting from side to side and improved with forward flexion.  Review of Systems  Constitutional: Negative for fever and chills.  Genitourinary: Positive for flank pain. Negative for dysuria, frequency, hematuria and vaginal discharge.  Neurological: Negative for numbness.       Past Medical History  Diagnosis Date  . Osteoporosis     on Fosamax for 5 years  . Diverticulosis of colon   . Other chronic nonalcoholic liver disease     LFTs normalized with weight loss 2013  . Colon cancer screening   . Other screening mammogram   . Other abnormal glucose   . Urinary tract infection, site not specified   . Migraine, unspecified, without mention of intractable migraine without mention of status migrainosus   . Symptomatic menopausal or female climacteric states   . Ocular migraine     Social History   Social History  . Marital Status: Married    Spouse Name: N/A  . Number of Children: N/A  . Years of Education: N/A   Occupational History  . [employer] (Optometrist)    Social History Main Topics  . Smoking status: Former Smoker    Types: Cigarettes  . Smokeless tobacco: Never Used     Comment: Quit in 2001  . Alcohol Use: Yes     Comment: Occasional  . Drug Use: No  . Sexual Activity: Not on file   Other Topics Concern  . Not on file   Social History Narrative   From [place].  Enjoys times with grandkids,    Minimal exercise   Daily Caffeine Use: 3 daily.    Past Surgical History  Procedure Laterality Date  . Btl  30+ years ago  . Tonsillectomy and  adenoidectomy  Age 4  . Ankle fracture surgery  ~2002    pinning  . Abdominal ultrasound  04/12/2004 & 6/06    positive gallstones  . Ct abdomen  04/29/04    Positive gallstones  . Ultrasound pelvis  6/06    Negative  . Colonoscopy  06/08/06    Polyps, divertics (Dr. Sharlett Iles)  . Cataract extraction  2013    B eyes    Family History  Problem Relation Age of Onset  . Depression Mother     Manic depression  . Diabetes Mother     Type 2  . Breast cancer Mother   . Colon polyps Father   . Heart disease Father   . Cancer Neg Hx   . Colon cancer Neg Hx   . Breast cancer Maternal Aunt   . Breast cancer Paternal Aunt     Allergies  Allergen Reactions  . Penicillins     REACTION: rash  . Sulfonamide Derivatives     REACTION: rash    Current Outpatient Prescriptions on File Prior to Visit  Medication Sig Dispense Refill  . Cholecalciferol ([employer]S VITAMIN D) 2000 UNITS CAPS Take 1 capsule (2,000 Units total) by mouth daily. 30 each   . vitamin B-12 (CYANOCOBALAMIN) 1000 MCG tablet Take 1,000 mcg by  mouth daily.    Marland Kitchen acetaminophen (TYLENOL) 500 MG tablet Take 1,000 mg by mouth 2 (two) times daily.     No current facility-administered medications on file prior to visit.    BP 122/72 mmHg  Pulse 86  Temp(Src) 97.8 F (36.6 C) (Oral)  Ht 5' 1"  (1.549 m)  Wt 153 lb 12.8 oz (69.763 kg)  BMI 29.08 kg/m2  SpO2 99%    Objective:   Physical Exam  Constitutional: She appears well-nourished.  Cardiovascular: Normal rate and regular rhythm.   Pulmonary/Chest: Effort normal and breath sounds normal.  Abdominal: There is no CVA tenderness.  Musculoskeletal:       Lumbar back: She exhibits pain. She exhibits normal range of motion and no tenderness.  Skin: Skin is warm and dry.          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 Flank pain bilaterally, worse with twisting. No urinary or vaginal symptoms.  Exam with pain to  twisting ROM. No CVA tenderness. UA: Negative for everything. No blood. Do not suspect renal stone or renal process. Suspect MSK or arthritis as cause. Supportive treatment provided (heat, ibuprofen, ROM exercises) Return precautions provided.

## 2015-10-15 NOTE — Addendum Note (Signed)
Addended by: Jacqualin Combes on: 10/15/2015 02:37 PM   Modules accepted: Orders

## 2015-10-15 NOTE — Patient Instructions (Signed)
Your urine sample does not show signs of infection.  Your pain is likely due to arthritis. Try taking ibuprofen 400-600 mg as needed for pain for the next several days.  Apply heat for discomfort and stay mobile to prevent stiffness.  Please call if you develop fevers, urinary frequency, increased pain, blood in your urine.  It was a pleasure meeting you!

## 2015-11-19 DIAGNOSIS — G478 Other sleep disorders: Secondary | ICD-10-CM | POA: Diagnosis not present

## 2015-12-21 ENCOUNTER — Encounter: Payer: Self-pay | Admitting: Internal Medicine

## 2015-12-24 ENCOUNTER — Encounter: Payer: Self-pay | Admitting: Gastroenterology

## 2016-02-01 ENCOUNTER — Encounter: Payer: Self-pay | Admitting: *Deleted

## 2016-02-09 ENCOUNTER — Encounter: Payer: Self-pay | Admitting: Internal Medicine

## 2016-02-09 ENCOUNTER — Ambulatory Visit (INDEPENDENT_AMBULATORY_CARE_PROVIDER_SITE_OTHER): Payer: Commercial Managed Care - HMO | Admitting: Internal Medicine

## 2016-02-09 VITALS — BP 140/80 | HR 60 | Ht 61.61 in | Wt 151.6 lb

## 2016-02-09 DIAGNOSIS — Z7902 Long term (current) use of antithrombotics/antiplatelets: Secondary | ICD-10-CM | POA: Diagnosis not present

## 2016-02-09 DIAGNOSIS — K7581 Nonalcoholic steatohepatitis (NASH): Secondary | ICD-10-CM

## 2016-02-09 DIAGNOSIS — Z86010 Personal history of colonic polyps: Secondary | ICD-10-CM | POA: Diagnosis not present

## 2016-02-09 MED ORDER — NA SULFATE-K SULFATE-MG SULF 17.5-3.13-1.6 GM/177ML PO SOLN: ORAL | Status: DC

## 2016-02-09 NOTE — Progress Notes (Signed)
Patient ID: Susan [name], female   DOB: Mar 22, 1946, 70 y.o.   MRN: [id number] HPI: Susan [name] is a  70 year old female with a past medical history of adenomatous colon polyps, diverticulosis, NASH who is here to discuss surveillance colonoscopy. She also has a history of osteoporosis, migraines,  Hyperlipidemia and vascular disease on Plavix. She reports that she has been feeling well from a GI standpoint. Very rarely she develops postprandial urgent loose stools. She is found trigger foods such as corn and iceberg lettuce. This happens less than once per month. Otherwise she denies abdominal pain, diarrhea or constipation. Bowel movements are regular without blood or melena. She denies weight loss. Good appetite. No nausea or vomiting. No heartburn. No dysphagia or odynophagia. She does take Plavix daily.   She reports seeing Dr. Humphrey Rolls in [employer] after an episode of chest pain and dizziness in December 2016. She reports an echocardiogram, EKG , and stress test after which she was started on Plavix. No PCI was performed.  Review of records reveals liver biopsy performed in 2007 for elevated LFTs. Biopsy proven NASH with stage I fibrosis at that time. Liver enzymes are normalized  Past Medical History  Diagnosis Date  . Osteoporosis     on Fosamax for 5 years  . Diverticulosis of colon   . Other chronic nonalcoholic liver disease     LFTs normalized with weight loss 2013  . Colon cancer screening   . Other screening mammogram   . Other abnormal glucose   . Urinary tract infection, site not specified   . Migraine, unspecified, without mention of intractable migraine without mention of status migrainosus   . Symptomatic menopausal or female climacteric states   . Ocular migraine   . Tubular adenoma of colon   . NASH (nonalcoholic steatohepatitis)   . IBS (irritable bowel syndrome)     Past Surgical History  Procedure Laterality Date  . Btl  30+ years ago  . Tonsillectomy and  adenoidectomy  Age 70  . Ankle fracture surgery  ~2002    pinning  . Abdominal ultrasound  04/12/2004 & 6/06    positive gallstones  . Ct abdomen  04/29/04    Positive gallstones  . Ultrasound pelvis  6/06    Negative  . Colonoscopy  06/08/06    Polyps, divertics (Dr. Sharlett Iles)  . Cataract extraction  2013    B eyes    Outpatient Prescriptions Prior to Visit  Medication Sig Dispense Refill  . acetaminophen (TYLENOL) 500 MG tablet Take 1,000 mg by mouth 2 (two) times daily.    Marland Kitchen aspirin 81 MG tablet Take 81 mg by mouth daily.    Marland Kitchen atorvastatin (LIPITOR) 40 MG tablet Take 40 mg by mouth daily.    . Cholecalciferol ([employer] VITAMIN D) 2000 UNITS CAPS Take 1 capsule (2,000 Units total) by mouth daily. 30 each   . clopidogrel (PLAVIX) 75 MG tablet Take 75 mg by mouth daily.    Marland Kitchen lisinopril (PRINIVIL,ZESTRIL) 5 MG tablet Take 5 mg by mouth daily.    . vitamin B-12 (CYANOCOBALAMIN) 1000 MCG tablet Take 1,000 mcg by mouth daily.     No facility-administered medications prior to visit.    Allergies  Allergen Reactions  . Penicillins     REACTION: rash  . Sulfonamide Derivatives     REACTION: rash    Family History  Problem Relation Age of Onset  . Depression Mother     Manic depression  . Diabetes Mother  Type 2  . Breast cancer Mother   . Colon polyps Father   . Heart disease Father   . Cancer Neg Hx   . Colon cancer Neg Hx   . Breast cancer Maternal Aunt   . Breast cancer Paternal Aunt     Social History  Substance Use Topics  . Smoking status: Former Smoker    Types: Cigarettes  . Smokeless tobacco: Never Used     Comment: Quit in 2001  . Alcohol Use: No     Comment: Occasional    ROS: As per history of present illness, otherwise negative  BP 140/80 mmHg  Pulse 60  Ht 5' 1.61" (1.565 m)  Wt 151 lb 9.6 oz (68.765 kg)  BMI 28.08 kg/m2 Constitutional: Well-developed and well-nourished. No distress. HEENT: Normocephalic and atraumatic. Oropharynx is clear and  moist. No oropharyngeal exudate. Conjunctivae are normal.  No scleral icterus. Neck: Neck supple. Trachea midline. Cardiovascular: Normal rate, regular rhythm and intact distal pulses. No M/R/G Pulmonary/chest: Effort normal and breath sounds normal. No wheezing, rales or rhonchi. Abdominal: Soft, obese, nontender, nondistended. Bowel sounds active throughout. There are no masses palpable. No hepatosplenomegaly. Extremities: no clubbing, cyanosis, or edema Lymphadenopathy: No cervical adenopathy noted. Neurological: Alert and oriented to person place and time. Skin: Skin is warm and dry. No rashes noted. Psychiatric: Normal mood and affect. Behavior is normal.  RELEVANT LABS AND IMAGING: CBC    Component Value Date/Time   WBC 5.5 08/30/2015 1536   RBC 4.42 08/30/2015 1536   HGB 14.1 08/30/2015 1536   HCT 40.7 08/30/2015 1536   PLT 169 08/30/2015 1536   MCV 91.9 08/30/2015 1536   MCH 31.9 08/30/2015 1536   MCHC 34.7 08/30/2015 1536   RDW 13.3 08/30/2015 1536   LYMPHSABS 2.7 07/08/2014 1518   MONOABS 0.5 07/08/2014 1518   EOSABS 0.0 07/08/2014 1518   BASOSABS 0.0 07/08/2014 1518    CMP     Component Value Date/Time   NA 140 08/30/2015 1536   K 3.4* 08/30/2015 1536   CL 108 08/30/2015 1536   CO2 25 08/30/2015 1536   GLUCOSE 112* 08/30/2015 1536   BUN 15 08/30/2015 1536   CREATININE 0.64 08/30/2015 1536   CREATININE 0.77 01/12/2012 1515   CALCIUM 8.9 08/30/2015 1536   PROT 6.9 03/18/2015 0931   ALBUMIN 3.9 03/18/2015 0931   AST 19 03/18/2015 0931   ALT 16 03/18/2015 0931   ALKPHOS 47 03/18/2015 0931   BILITOT 0.5 03/18/2015 0931   GFRNONAA >60 08/30/2015 1536   GFRAA >60 08/30/2015 1536    ASSESSMENT/PLAN:  70 year old female with a past medical history of adenomatous colon polyps, diverticulosis, NASH who is here to discuss surveillance colonoscopy.  1. History of adenomatous colon polyps on Plavix -- last colonoscopy 2012 with 2 polyps removed one of which was an  adenoma. Also prior adenoma in 2007. Colonoscopy due at this time. We discussed the risks, benefits and alternatives and she is agreeable to proceed. Hold Plavix 5 days before procedure - will instruct when and how to resume after procedure. Risks and benefits of procedure including bleeding, perforation, infection, missed lesions, medication reactions and possible hospitalization or surgery if complications occur explained. Additional rare but real risk of cardiovascular event such as heart attack or ischemia/infarct of other organs off Plavix explained and need to seek urgent help if this occurs. Will communicate by phone or EMR with patient's prescribing provider that to confirm holding Plavix is reasonable in this case.  2. NASH -- recently liver enzymes have been very normal. No evidence of advanced or decompensated liver disease. I recommended that she continue to work on risk factor modification and have her liver enzymes monitored serially by primary care.     FA:OZHYQM Josefine Class, [place], [place] [address]

## 2016-02-09 NOTE — Patient Instructions (Signed)
You have been scheduled for a colonoscopy. Please follow written instructions given to you at your visit today.  Please pick up your prep supplies at the pharmacy within the next 1-3 days. If you use inhalers (even only as needed), please bring them with you on the day of your procedure. Your physician has requested that you go to www.startemmi.com and enter the access code given to you at your visit today. This web site gives a general overview about your procedure. However, you should still follow specific instructions given to you by our office regarding your preparation for the procedure.  We will contact Dr Chancy Milroy regarding holding you Plavix for procedure.  If you are age 83 or older, your body mass index should be between 23-30. Your Body mass index is 28.08 kg/(m^2). If this is out of the aforementioned range listed, please consider follow up with your Primary Care Provider.  If you are age 107 or younger, your body mass index should be between 19-25. Your Body mass index is 28.08 kg/(m^2). If this is out of the aformentioned range listed, please consider follow up with your Primary Care Provider.

## 2016-02-10 ENCOUNTER — Telehealth: Payer: Self-pay | Admitting: Internal Medicine

## 2016-02-10 NOTE — Telephone Encounter (Signed)
That's fine. I have already sent a letter over to Dr Humphrey Rolls about the anticoagulation clearance.

## 2016-02-11 NOTE — Telephone Encounter (Signed)
Patient may call if she needs a different prep and is unable to afford it.

## 2016-03-02 DIAGNOSIS — I351 Nonrheumatic aortic (valve) insufficiency: Secondary | ICD-10-CM | POA: Diagnosis not present

## 2016-03-02 DIAGNOSIS — I340 Nonrheumatic mitral (valve) insufficiency: Secondary | ICD-10-CM | POA: Diagnosis not present

## 2016-03-02 DIAGNOSIS — I2510 Atherosclerotic heart disease of native coronary artery without angina pectoris: Secondary | ICD-10-CM | POA: Diagnosis not present

## 2016-03-02 DIAGNOSIS — I10 Essential (primary) hypertension: Secondary | ICD-10-CM | POA: Diagnosis not present

## 2016-03-03 ENCOUNTER — Telehealth: Payer: Self-pay | Admitting: Internal Medicine

## 2016-03-03 NOTE — Telephone Encounter (Signed)
Patient has been advised that per Ms. Hammonds,NP-C , she may hold plavix 5 days prior to procedure. Patient states that the suprep is very expensive for her and she would like a sample if one comes available.

## 2016-03-03 NOTE — Telephone Encounter (Signed)
Per Daune Perch, NP-C (Dr Laurelyn Sickle office) recent office note on 03/02/16, "patient is at low risk for colonoscopy. May stop Plavix for 5 days prior to procedure and resume 1-2 days post procedure." (see actual office note under "Media" tab in EPIC). I have left a voicemail for patient to call back.

## 2016-03-09 ENCOUNTER — Encounter: Payer: Self-pay | Admitting: Internal Medicine

## 2016-03-15 ENCOUNTER — Telehealth: Payer: Self-pay | Admitting: Internal Medicine

## 2016-03-15 NOTE — Telephone Encounter (Signed)
Patient has been advised that a prep has been placed at the front desk for her to pick up. She verbalizes understanding.

## 2016-03-21 ENCOUNTER — Telehealth: Payer: Self-pay | Admitting: *Deleted

## 2016-03-21 NOTE — Telephone Encounter (Signed)
Dr. Hilarie Fredrickson called in admitting and requested to call patient and ask if she would come in earlier tomorrow for procedure.  Waldo Laine RN called patient and requested she come in at 1:30 for 2:00 procedure time.  Patient agreed to move procedure time from 4:00 to 2:00    Patient was given instructions to start prep at 9:00am and to have nothing by mouth after 11:00 am.  Patient verbalized understanding of instructions.

## 2016-03-22 ENCOUNTER — Encounter: Payer: Self-pay | Admitting: Internal Medicine

## 2016-03-22 ENCOUNTER — Ambulatory Visit (AMBULATORY_SURGERY_CENTER): Payer: Commercial Managed Care - HMO | Admitting: Internal Medicine

## 2016-03-22 VITALS — BP 121/51 | HR 70 | Temp 99.1°F | Resp 10 | Ht 61.0 in | Wt 151.0 lb

## 2016-03-22 DIAGNOSIS — D122 Benign neoplasm of ascending colon: Secondary | ICD-10-CM | POA: Diagnosis not present

## 2016-03-22 DIAGNOSIS — Z86010 Personal history of colonic polyps: Secondary | ICD-10-CM

## 2016-03-22 DIAGNOSIS — E669 Obesity, unspecified: Secondary | ICD-10-CM | POA: Diagnosis not present

## 2016-03-22 DIAGNOSIS — I10 Essential (primary) hypertension: Secondary | ICD-10-CM | POA: Diagnosis not present

## 2016-03-22 DIAGNOSIS — E119 Type 2 diabetes mellitus without complications: Secondary | ICD-10-CM | POA: Diagnosis not present

## 2016-03-22 MED ORDER — SODIUM CHLORIDE 0.9 % IV SOLN: 500.0000 mL | INTRAVENOUS | Status: DC

## 2016-03-22 NOTE — Patient Instructions (Signed)
Colon polyp removed today. Diverticulosis and hemorrhoids seen. Handouts given.  Continue previous medications. Repeat colonoscopy in 5 years. Resume Plavix (clopidgrel) at prior dose tomorrow. Call us with any questions or concerns. Thank you!   YOU HAD AN ENDOSCOPIC PROCEDURE TODAY AT [facility]:   Refer to the procedure report that was given to you for any specific questions about what was found during the examination.  If the procedure report does not answer your questions, please call your gastroenterologist to clarify.  If you requested that your care partner not be given the details of your procedure findings, then the procedure report has been included in a sealed envelope for you to review at your convenience later.  YOU SHOULD EXPECT: Some feelings of bloating in the abdomen. Passage of more gas than usual.  Walking can help get rid of the air that was put into your GI tract during the procedure and reduce the bloating. If you had a lower endoscopy (such as a colonoscopy or flexible sigmoidoscopy) you may notice spotting of blood in your stool or on the toilet paper. If you underwent a bowel prep for your procedure, you may not have a normal bowel movement for a few days.  Please Note:  You might notice some irritation and congestion in your nose or some drainage.  This is from the oxygen used during your procedure.  There is no need for concern and it should clear up in a day or so.  SYMPTOMS TO REPORT IMMEDIATELY:   Following lower endoscopy (colonoscopy or flexible sigmoidoscopy):  Excessive amounts of blood in the stool  Significant tenderness or worsening of abdominal pains  Swelling of the abdomen that is new, acute  Fever of 100F or higher   For urgent or emergent issues, a gastroenterologist can be reached at any hour by calling [phone].   DIET: Your first meal following the procedure should be a small meal and then it is ok to progress to your  normal diet. Heavy or fried foods are harder to digest and may make you feel nauseous or bloated.  Likewise, meals heavy in dairy and vegetables can increase bloating.  Drink plenty of fluids but you should avoid alcoholic beverages for 24 hours.  ACTIVITY:  You should plan to take it easy for the rest of today and you should NOT DRIVE or use heavy machinery until tomorrow (because of the sedation medicines used during the test).    FOLLOW UP: Our staff will call the number listed on your records the next business day following your procedure to check on you and address any questions or concerns that you may have regarding the information given to you following your procedure. If we do not reach you, we will leave a message.  However, if you are feeling well and you are not experiencing any problems, there is no need to return our call.  We will assume that you have returned to your regular daily activities without incident.  If any biopsies were taken you will be contacted by phone or by letter within the next 1-3 weeks.  Please call us at [phone] if you have not heard about the biopsies in 3 weeks.    SIGNATURES/CONFIDENTIALITY: You and/or your care partner have signed paperwork which will be entered into your electronic medical record.  These signatures attest to the fact that that the information above on your After Visit Summary has been reviewed and is understood.  Full responsibility of  the confidentiality of this discharge information lies with you and/or your care-partner.

## 2016-03-22 NOTE — Op Note (Signed)
[facility] Patient Name: Susan [name] Procedure Date: 03/22/2016 2:03 PM MRN: [id number] Endoscopist: Jerene Bears , MD Age: 70 Referring MD:  Date of Birth: 06-23-1946 Gender: Female Procedure:                Colonoscopy Indications:              Surveillance: Personal history of adenomatous                            polyps on last colonoscopy 5 years ago Medicines:                Monitored Anesthesia Care Procedure:                Pre-Anesthesia Assessment:                           - Prior to the procedure, a History and Physical                            was performed, and patient medications and                            allergies were reviewed. The patient's tolerance of                            previous anesthesia was also reviewed. The risks                            and benefits of the procedure and the sedation                            options and risks were discussed with the patient.                            All questions were answered, and informed consent                            was obtained. Prior Anticoagulants: The patient has                            taken Plavix (clopidogrel), last dose was 5 days                            prior to procedure. ASA Grade Assessment: III - A                            patient with severe systemic disease. After                            reviewing the risks and benefits, the patient was                            deemed in satisfactory condition to undergo the  procedure.                           After obtaining informed consent, the colonoscope                            was passed under direct vision. Throughout the                            procedure, the patient's blood pressure, pulse, and                            oxygen saturations were monitored continuously. The                            Model PCF-[id number]) scope was introduced   through the anus and advanced to the the cecum,                            identified by appendiceal orifice and ileocecal                            valve. The colonoscopy was performed without                            difficulty. The patient tolerated the procedure                            well. The quality of the bowel preparation was                            good. The ileocecal valve, appendiceal orifice, and                            rectum were photographed. Scope In: 2:10:24 PM Scope Out: 2:21:52 PM Scope Withdrawal Time: 0 hours 9 minutes 3 seconds  Total Procedure Duration: 0 hours 11 minutes 28 seconds  Findings:                 The perianal examination was normal.                           A 5 mm polyp was found in the ascending colon. The                            polyp was sessile. The polyp was removed with a                            cold snare. Resection and retrieval were complete.                           Many small and large-mouthed diverticula were found                            in the sigmoid colon, descending colon, hepatic  flexure and ascending colon.                           Internal hemorrhoids were found during                            retroflexion. The hemorrhoids were small. Complications:            No immediate complications. Estimated Blood Loss:     Estimated blood loss: none. Impression:               - One 5 mm polyp in the ascending colon, removed                            with a cold snare. Resected and retrieved.                           - Severe diverticulosis in the sigmoid colon, in                            the descending colon, at the hepatic flexure and in                            the ascending colon.                           - Internal hemorrhoids. Recommendation:           - Patient has a contact number available for                            emergencies. The signs and symptoms of potential                             delayed complications were discussed with the                            patient. Return to normal activities tomorrow.                            Written discharge instructions were provided to the                            patient.                           - Resume previous diet.                           - Continue present medications.                           - Resume Plavix (clopidogrel) at prior dose                            tomorrow.                           -  Await pathology results.                           - Repeat colonoscopy in 5 years for surveillance. Jerene Bears, MD 03/22/2016 2:25:11 PM This report has been signed electronically.

## 2016-03-22 NOTE — Progress Notes (Signed)
Report to PACU, RN, vss, BBS= Clear.  

## 2016-03-22 NOTE — Progress Notes (Signed)
Called to room to assist during endoscopic procedure.  Patient ID and intended procedure confirmed with present staff. Received instructions for my participation in the procedure from the performing physician.  

## 2016-03-23 ENCOUNTER — Telehealth: Payer: Self-pay

## 2016-03-23 NOTE — Telephone Encounter (Signed)
  Follow up Call-  Call back number 03/22/2016  Post procedure Call Back phone  # [phone]  Permission to leave phone message Yes     Patient questions:  Do you have a fever, pain , or abdominal swelling? No. Pain Score  0 *  Have you tolerated food without any problems? Yes.    Have you been able to return to your normal activities? Yes.    Do you have any questions about your discharge instructions: Diet   No. Medications  No. Follow up visit  No.  Do you have questions or concerns about your Care? No.  Actions: * If pain score is 4 or above: No action needed, pain <4.

## 2016-03-27 ENCOUNTER — Encounter: Payer: Self-pay | Admitting: Internal Medicine

## 2016-05-04 DIAGNOSIS — R0602 Shortness of breath: Secondary | ICD-10-CM | POA: Diagnosis not present

## 2016-05-04 DIAGNOSIS — I10 Essential (primary) hypertension: Secondary | ICD-10-CM | POA: Diagnosis not present

## 2016-05-04 DIAGNOSIS — I2510 Atherosclerotic heart disease of native coronary artery without angina pectoris: Secondary | ICD-10-CM | POA: Diagnosis not present

## 2016-05-04 DIAGNOSIS — I351 Nonrheumatic aortic (valve) insufficiency: Secondary | ICD-10-CM | POA: Diagnosis not present

## 2016-07-07 ENCOUNTER — Other Ambulatory Visit: Payer: Self-pay | Admitting: Family Medicine

## 2016-07-07 DIAGNOSIS — I10 Essential (primary) hypertension: Secondary | ICD-10-CM

## 2016-07-07 DIAGNOSIS — E538 Deficiency of other specified B group vitamins: Secondary | ICD-10-CM

## 2016-07-07 DIAGNOSIS — M810 Age-related osteoporosis without current pathological fracture: Secondary | ICD-10-CM

## 2016-07-12 ENCOUNTER — Telehealth: Payer: Self-pay

## 2016-07-12 DIAGNOSIS — Z1159 Encounter for screening for other viral diseases: Secondary | ICD-10-CM

## 2016-07-12 NOTE — Telephone Encounter (Signed)
Added Hep C to labs

## 2016-07-13 ENCOUNTER — Other Ambulatory Visit (INDEPENDENT_AMBULATORY_CARE_PROVIDER_SITE_OTHER): Payer: Commercial Managed Care - HMO

## 2016-07-13 ENCOUNTER — Ambulatory Visit (INDEPENDENT_AMBULATORY_CARE_PROVIDER_SITE_OTHER): Payer: Commercial Managed Care - HMO

## 2016-07-13 VITALS — BP 122/78 | HR 82 | Temp 98.7°F | Ht 61.5 in | Wt 153.8 lb

## 2016-07-13 DIAGNOSIS — Z0000 Encounter for general adult medical examination without abnormal findings: Secondary | ICD-10-CM | POA: Diagnosis not present

## 2016-07-13 DIAGNOSIS — I10 Essential (primary) hypertension: Secondary | ICD-10-CM | POA: Diagnosis not present

## 2016-07-13 DIAGNOSIS — Z1159 Encounter for screening for other viral diseases: Secondary | ICD-10-CM | POA: Diagnosis not present

## 2016-07-13 DIAGNOSIS — Z23 Encounter for immunization: Secondary | ICD-10-CM

## 2016-07-13 DIAGNOSIS — E538 Deficiency of other specified B group vitamins: Secondary | ICD-10-CM

## 2016-07-13 DIAGNOSIS — M810 Age-related osteoporosis without current pathological fracture: Secondary | ICD-10-CM

## 2016-07-13 LAB — LIPID PANEL
CHOLESTEROL: 128 mg/dL (ref 0–200)
HDL: 52.1 mg/dL (ref >39.00)
LDL CALC: 55 mg/dL (ref 0–99)
NONHDL: 75.43
Total CHOL/HDL Ratio: 2
Triglycerides: 101 mg/dL (ref 0.0–149.0)
VLDL: 20.2 mg/dL (ref 0.0–40.0)

## 2016-07-13 LAB — COMPREHENSIVE METABOLIC PANEL
ALT: 29 U/L (ref 0–35)
AST: 29 U/L (ref 0–37)
Albumin: 4.3 g/dL (ref 3.5–5.2)
Alkaline Phosphatase: 45 U/L (ref 39–117)
BUN: 19 mg/dL (ref 6–23)
CHLORIDE: 107 meq/L (ref 96–112)
CO2: 30 meq/L (ref 19–32)
Calcium: 9.2 mg/dL (ref 8.4–10.5)
Creatinine, Ser: 0.71 mg/dL (ref 0.40–1.20)
GFR: 86.24 mL/min (ref >60.00)
GLUCOSE: 111 mg/dL — AB (ref 70–99)
POTASSIUM: 4.3 meq/L (ref 3.5–5.1)
SODIUM: 141 meq/L (ref 135–145)
Total Bilirubin: 0.4 mg/dL (ref 0.2–1.2)
Total Protein: 7.2 g/dL (ref 6.0–8.3)

## 2016-07-13 LAB — VITAMIN B12: VITAMIN B 12: 996 pg/mL — AB (ref 211–911)

## 2016-07-13 LAB — TSH: TSH: 4.34 u[IU]/mL (ref 0.35–4.50)

## 2016-07-13 LAB — VITAMIN D 25 HYDROXY (VIT D DEFICIENCY, FRACTURES): VITD: 32.15 ng/mL (ref 30.00–100.00)

## 2016-07-13 NOTE — Patient Instructions (Signed)
Susan [name] , Thank you for taking time to come for your Medicare Wellness Visit. I appreciate your ongoing commitment to your health goals. Please review the following plan we discussed and let me know if I can assist you in the future.   These are the goals we discussed: Goals    . Increase water intake          Starting 07/13/2016, I will attempt to drink at least 6-8 glasses daily.        This is a list of the screening recommended for you and due dates:  Health Maintenance  Topic Date Due  . Shingles Vaccine  07/13/2024*  . Mammogram  09/22/2017  . Tetanus Vaccine  08/12/2020  . Colon Cancer Screening  03/22/2021  . Flu Shot  Completed  . DEXA scan (bone density measurement)  Completed  .  Hepatitis C: One time screening is recommended by Center for Disease Control  (CDC) for  adults born from [birth year] through 1966.   Completed  . Pneumonia vaccines  Completed  *Topic was postponed. The date shown is not the original due date.   Preventive Care for Adults  A healthy lifestyle and preventive care can promote health and wellness. Preventive health guidelines for adults include the following key practices.  . A routine yearly physical is a good way to check with your health care provider about your health and preventive screening. It is a chance to share any concerns and updates on your health and to receive a thorough exam.  . Visit your dentist for a routine exam and preventive care every 6 months. Brush your teeth twice a day and floss once a day. Good oral hygiene prevents tooth decay and gum disease.  . The frequency of eye exams is based on your age, health, family medical history, use  of contact lenses, and other factors. Follow your health care provider's ecommendations for frequency of eye exams.  . Eat a healthy diet. Foods like vegetables, fruits, whole grains, low-fat dairy products, and lean protein foods contain the nutrients you need without too many calories.  Decrease your intake of foods high in solid fats, added sugars, and salt. Eat the right amount of calories for you. Get information about a proper diet from your health care provider, if necessary.  . Regular physical exercise is one of the most important things you can do for your health. Most adults should get at least 150 minutes of moderate-intensity exercise (any activity that increases your heart rate and causes you to sweat) each week. In addition, most adults need muscle-strengthening exercises on 2 or more days a week.  Silver Sneakers may be a benefit available to you. To determine eligibility, you may visit the website: www.silversneakers.com or contact program at [phone] Mon-Fri between 8AM-8PM.   . Maintain a healthy weight. The body mass index (BMI) is a screening tool to identify possible weight problems. It provides an estimate of body fat based on height and weight. Your health care provider can find your BMI and can help you achieve or maintain a healthy weight.   For adults 20 years and older: ? A BMI below 18.5 is considered underweight. ? A BMI of 18.5 to 24.9 is normal. ? A BMI of 25 to 29.9 is considered overweight. ? A BMI of 30 and above is considered obese.   . Maintain normal blood lipids and cholesterol levels by exercising and minimizing your intake of saturated fat. Eat a balanced diet with  plenty of fruit and vegetables. Blood tests for lipids and cholesterol should begin at age 57 and be repeated every 5 years. If your lipid or cholesterol levels are high, you are over 50, or you are at high risk for heart disease, you may need your cholesterol levels checked more frequently. Ongoing high lipid and cholesterol levels should be treated with medicines if diet and exercise are not working.  . If you smoke, find out from your health care provider how to quit. If you do not use tobacco, please do not start.  . If you choose to drink alcohol, please do not consume  more than 2 drinks per day. One drink is considered to be 12 ounces (355 mL) of beer, 5 ounces (148 mL) of wine, or 1.5 ounces (44 mL) of liquor.  . If you are 8-59 years old, ask your health care provider if you should take aspirin to prevent strokes.  . Use sunscreen. Apply sunscreen liberally and repeatedly throughout the day. You should seek shade when your shadow is shorter than you. Protect yourself by wearing long sleeves, pants, a wide-brimmed hat, and sunglasses year round, whenever you are outdoors.  . Once a month, do a whole body skin exam, using a mirror to look at the skin on your back. Tell your health care provider of new moles, moles that have irregular borders, moles that are larger than a pencil eraser, or moles that have changed in shape or color.

## 2016-07-13 NOTE — Progress Notes (Signed)
Pre visit review using our clinic review tool, if applicable. No additional management support is needed unless otherwise documented below in the visit note. 

## 2016-07-13 NOTE — Telephone Encounter (Signed)
Thanks

## 2016-07-14 LAB — HEPATITIS C ANTIBODY: HCV Ab: NEGATIVE

## 2016-07-21 ENCOUNTER — Encounter: Payer: Self-pay | Admitting: Family Medicine

## 2016-07-21 ENCOUNTER — Ambulatory Visit (INDEPENDENT_AMBULATORY_CARE_PROVIDER_SITE_OTHER): Payer: Commercial Managed Care - HMO | Admitting: Family Medicine

## 2016-07-21 VITALS — BP 152/72 | HR 74 | Temp 98.6°F | Ht 62.0 in | Wt 156.2 lb

## 2016-07-21 DIAGNOSIS — E538 Deficiency of other specified B group vitamins: Secondary | ICD-10-CM

## 2016-07-21 DIAGNOSIS — E785 Hyperlipidemia, unspecified: Secondary | ICD-10-CM

## 2016-07-21 DIAGNOSIS — R739 Hyperglycemia, unspecified: Secondary | ICD-10-CM | POA: Diagnosis not present

## 2016-07-21 DIAGNOSIS — M810 Age-related osteoporosis without current pathological fracture: Secondary | ICD-10-CM | POA: Diagnosis not present

## 2016-07-21 DIAGNOSIS — Z0000 Encounter for general adult medical examination without abnormal findings: Secondary | ICD-10-CM

## 2016-07-21 DIAGNOSIS — Z7189 Other specified counseling: Secondary | ICD-10-CM

## 2016-07-21 NOTE — Progress Notes (Signed)
Carotid stenosis.  Per dr. Humphrey Rolls.  I'll defer to patient and cardiology.    Shingles shot d/w pt.  See AVS.   Elevated Cholesterol: Using medications without problems:yes Muscle aches: occ aches, unclear if from statin.   Diet compliance: encouarged Exercise: encouraged Lipids at goal. D/w pt.   Mild inc in sugar.  D/w pt about labs, diet and exercise.    B12 def.  Replaced, d/w pt.    H/o osteoporosis.  DXA 2016, repeat not due.  Mammogram due, d/w pt.   Colonoscopy 2017 Advance directive- husband then daughters Thressa Sheller and Stephan Minister equally designated if patient were incapacitated.   PMH and SH reviewed  Meds, vitals, and allergies reviewed.   ROS: Per HPI unless specifically indicated in ROS section   GEN: nad, alert and oriented HEENT: mucous membranes moist NECK: supple w/o LA CV: rrr. PULM: ctab, no inc wob ABD: soft, +bs EXT: no edema SKIN: no acute rash

## 2016-07-21 NOTE — Patient Instructions (Addendum)
Check with your insurance to see if they will cover the shingles shot. Call about a mammogram.   Stop atorvastatin for about 2 weeks and see if the aches get better.  Update me one way or the other.   Take care.  Glad to see you.

## 2016-07-21 NOTE — Progress Notes (Signed)
Pre visit review using our clinic review tool, if applicable. No additional management support is needed unless otherwise documented below in the visit note. 

## 2016-07-21 NOTE — Assessment & Plan Note (Signed)
Advance directive- husband first then daughters Thressa Sheller and Stephan Minister equally designated if patient were incapacitated.

## 2016-07-23 DIAGNOSIS — E785 Hyperlipidemia, unspecified: Secondary | ICD-10-CM | POA: Insufficient documentation

## 2016-07-23 DIAGNOSIS — Z0000 Encounter for general adult medical examination without abnormal findings: Secondary | ICD-10-CM | POA: Insufficient documentation

## 2016-07-23 NOTE — Assessment & Plan Note (Signed)
Discussed with patient about labs, diet and exercise.

## 2016-07-23 NOTE — Assessment & Plan Note (Signed)
Discussed with patient about labs diet and exercise.  She does have some aches, possibly related to statin. She will hold this for period of time and then update me. See after visit summary.>25 minutes spent in face to face time with patient, >50% spent in counselling or coordination of care.

## 2016-07-23 NOTE — Assessment & Plan Note (Signed)
H/o osteoporosis.  DXA 2016, repeat not due.

## 2016-07-23 NOTE — Assessment & Plan Note (Signed)
Mammogram due, d/w pt.   Colonoscopy 2017 Advance directive- husband then daughters Thressa Sheller and Stephan Minister equally designated if patient were incapacitated.

## 2016-07-23 NOTE — Assessment & Plan Note (Signed)
B12 not low. Continue as is. Discussed with patient. She agrees.

## 2016-07-28 NOTE — Progress Notes (Signed)
Subjective:   Susan [name] is a 70 y.o. female who presents for Medicare Annual (Subsequent) preventive examination.  Review of Systems:  N/A Cardiac Risk Factors include: advanced age (>13mn, >>61women);hypertension;sedentary lifestyle     Objective:     Vitals: BP 122/78 (BP Location: Left Arm, Patient Position: Sitting, Cuff Size: Normal)   Pulse 82   Temp 98.7 F (37.1 C) (Oral)   Ht 5' 1.5" (1.562 m) Comment: no shoes  Wt 153 lb 12 oz (69.7 kg)   SpO2 98%   BMI 28.58 kg/m   Body mass index is 28.58 kg/m.   Tobacco History  Smoking Status  . Former Smoker  . Types: Cigarettes  Smokeless Tobacco  . Never Used    Comment: Quit in 2001     Counseling given: No   Past Medical History:  Diagnosis Date  . Colon cancer screening   . Diverticulosis of colon   . IBS (irritable bowel syndrome)   . 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 Migraine, unspecified, without mention of intractable migraine without mention of status migrainosus   . NASH (nonalcoholic steatohepatitis)   . Ocular migraine   . Osteoporosis    on Fosamax for 5 years  . Other abnormal glucose   . Other chronic nonalcoholic liver disease    LFTs normalized with weight loss 2013  . Other screening mammogram   . Symptomatic menopausal or female climacteric states   . Tubular adenoma of colon   . Urinary tract infection, site not specified    Past Surgical History:  Procedure Laterality Date  . abdominal ultrasound  04/12/2004 & 6/06   positive gallstones  . ANKLE FRACTURE SURGERY  ~2002   pinning  . BTL  30+ years ago  . CATARACT EXTRACTION  2013   B eyes  . COLONOSCOPY  06/08/06   Polyps, divertics (Dr. PSharlett Iles  . CT abdomen  04/29/04   Positive gallstones  . TONSILLECTOMY AND ADENOIDECTOMY  Age 70 . ultrasound pelvis  6/06   Negative   Family History  Problem Relation Age of Onset  . Depression Mother     Manic depression  . Diabetes Mother     Type 2  . Breast cancer Mother     . Colon polyps Father   . Heart disease Father   . Breast cancer Maternal Aunt   . Breast cancer Paternal Aunt   . Cancer Neg Hx   . Colon cancer Neg Hx    History  Sexual Activity  . Sexual activity: No    Outpatient Encounter Prescriptions as of 07/13/2016  Medication Sig  . acetaminophen (TYLENOL) 500 MG tablet Take 1,000 mg by mouth 2 (two) times daily.  .Marland Kitchenaspirin 81 MG tablet Take 81 mg by mouth daily.  .Marland Kitchenatorvastatin (LIPITOR) 40 MG tablet Take 40 mg by mouth daily.  . Cholecalciferol ([employer] VITAMIN D) 2000 UNITS CAPS Take 1 capsule (2,000 Units total) by mouth daily.  . clopidogrel (PLAVIX) 75 MG tablet Take 75 mg by mouth daily.  .Marland Kitchenlisinopril (PRINIVIL,ZESTRIL) 5 MG tablet Take 5 mg by mouth daily.  . vitamin B-12 (CYANOCOBALAMIN) 1000 MCG tablet Take 1,000 mcg by mouth daily.   No facility-administered encounter medications on file as of 07/13/2016.     Activities of Daily Living In your present state of health, do you have any difficulty performing the following activities: 07/13/2016  Hearing? Y  Vision? N  Difficulty concentrating or making decisions? N  Walking or  climbing stairs? N  Dressing or bathing? N  Doing errands, shopping? N  Preparing Food and eating ? N  Using the Toilet? N  In the past six months, have you accidently leaked urine? Y  Do you have problems with loss of bowel control? N  Managing your Medications? N  Managing your Finances? N  Housekeeping or managing your Housekeeping? N  Some recent data might be hidden    Patient Care Team: Tonia Ghent, MD as PCP - General Celesta Gentile, MD as Consulting Physician (Cardiology)    Assessment:     Hearing Screening   125Hz  250Hz  500Hz  1000Hz  2000Hz  3000Hz  4000Hz  6000Hz  8000Hz   Right ear:   40 0 40  0    Left ear:   0 0 40  0      Visual Acuity Screening   Right eye Left eye Both eyes  Without correction:     With correction: 20/25 20/25 20/25     Exercise Activities and Dietary  recommendations Current Exercise Habits: The patient does not participate in regular exercise at present, Exercise limited by: None identified  Goals    . Increase water intake          Starting 07/13/2016, I will attempt to drink at least 6-8 glasses daily.       Fall Risk Fall Risk  07/13/2016 03/22/2015 08/25/2013  Falls in the past year? No No No   Depression Screen PHQ 2/9 Scores 07/13/2016 03/22/2015 08/25/2013  PHQ - 2 Score 0 0 0     Cognitive Testing MMSE - Mini Mental State Exam 07/13/2016  Orientation to time 5  Orientation to Place 5  Registration 3  Attention/ Calculation 0  Recall 3  Language- name 2 objects 0  Language- repeat 1  Language- follow 3 step command 3  Language- read & follow direction 0  Write a sentence 0  Copy design 0  Total score 20   PLEASE NOTE: A Mini-Cog screen was completed. Maximum score is 20. A value of 0 denotes this part of Folstein MMSE was not completed or the patient failed this part of the Mini-Cog screening.   Mini-Cog Screening Orientation to Time - Max 5 pts Orientation to Place - Max 5 pts Registration - Max 3 pts Recall - Max 3 pts Language Repeat - Max 1 pts Language Follow 3 Step Command - Max 3 pts   Immunization History  Administered Date(s) Administered  . Influenza Whole 08/12/2010  . Influenza, Seasonal, Injecte, Preservative Fre 12/13/2012  . Influenza,inj,Quad PF,36+ Mos 08/25/2013, 09/03/2014, 07/13/2016  . Pneumococcal Conjugate-13 03/22/2015  . Pneumococcal Polysaccharide-23 08/12/2010  . Td 11/06/1998, 08/12/2010   Screening Tests Health Maintenance  Topic Date Due  . ZOSTAVAX  07/13/2024 (Originally 06/28/2005)  . MAMMOGRAM  09/22/2017  . TETANUS/TDAP  08/12/2020  . COLONOSCOPY  03/22/2021  . INFLUENZA VACCINE  Completed  . DEXA SCAN  Completed  . Hepatitis C Screening  Completed  . PNA vac Low Risk Adult  Completed      Plan:     I have personally reviewed and addressed the Medicare Annual  Wellness questionnaire and have noted the following in the patient's chart:  A. Medical and social history B. Use of alcohol, tobacco or illicit drugs  C. Current medications and supplements D. Functional ability and status E.  Nutritional status F.  Physical activity G. Advance directives H. List of other physicians I.  Hospitalizations, surgeries, and ER visits in previous 12 months J.  Vitals K. Screenings to include hearing, vision, cognitive, depression L. Referrals and appointments - none  In addition, I have reviewed and discussed with patient certain preventive protocols, quality metrics, and best practice recommendations. A written personalized care plan for preventive services as well as general preventive health recommendations were provided to patient.  See attached scanned questionnaire for additional information.   Signed,   Lindell Noe, MHA, BS, LPN Health Advisor

## 2016-07-28 NOTE — Progress Notes (Signed)
PCP notes:   Health maintenance: Flu vaccine - administered Hep C screening - completed  Abnormal screenings:   Hearing - failed  Patient concerns:   None  Nurse concerns:  None  Next PCP appt:   07/21/16 @ 0945  I reviewed health advisor's note, was available for consultation on the day of service listed in this note, and agree with documentation and plan. Elsie Stain, MD.

## 2016-08-04 ENCOUNTER — Encounter: Payer: Self-pay | Admitting: Medical Oncology

## 2016-08-04 ENCOUNTER — Emergency Department
Admission: EM | Admit: 2016-08-04 | Discharge: 2016-08-04 | Disposition: A | Discharge status: Home / Self Care | Payer: Commercial Managed Care - HMO | Attending: Student | Admitting: Student

## 2016-08-04 DIAGNOSIS — I10 Essential (primary) hypertension: Secondary | ICD-10-CM | POA: Diagnosis not present

## 2016-08-04 DIAGNOSIS — R110 Nausea: Secondary | ICD-10-CM | POA: Diagnosis not present

## 2016-08-04 DIAGNOSIS — Z79899 Other long term (current) drug therapy: Secondary | ICD-10-CM | POA: Diagnosis not present

## 2016-08-04 DIAGNOSIS — N390 Urinary tract infection, site not specified: Secondary | ICD-10-CM | POA: Diagnosis not present

## 2016-08-04 DIAGNOSIS — Z7982 Long term (current) use of aspirin: Secondary | ICD-10-CM | POA: Insufficient documentation

## 2016-08-04 DIAGNOSIS — R42 Dizziness and giddiness: Secondary | ICD-10-CM | POA: Diagnosis not present

## 2016-08-04 DIAGNOSIS — N3000 Acute cystitis without hematuria: Secondary | ICD-10-CM | POA: Diagnosis not present

## 2016-08-04 DIAGNOSIS — Z87891 Personal history of nicotine dependence: Secondary | ICD-10-CM | POA: Insufficient documentation

## 2016-08-04 DIAGNOSIS — H8110 Benign paroxysmal vertigo, unspecified ear: Secondary | ICD-10-CM | POA: Insufficient documentation

## 2016-08-04 LAB — URINALYSIS COMPLETE WITH MICROSCOPIC (ARMC ONLY)
BILIRUBIN URINE: NEGATIVE
GLUCOSE, UA: NEGATIVE mg/dL
HGB URINE DIPSTICK: NEGATIVE
Ketones, ur: NEGATIVE mg/dL
NITRITE: POSITIVE — AB
Protein, ur: NEGATIVE mg/dL
Specific Gravity, Urine: 1.015 (ref 1.005–1.030)
pH: 5 (ref 5.0–8.0)

## 2016-08-04 LAB — COMPREHENSIVE METABOLIC PANEL
ALK PHOS: 43 U/L (ref 38–126)
ALT: 33 U/L (ref 14–54)
ANION GAP: 7 (ref 5–15)
AST: 33 U/L (ref 15–41)
Albumin: 4.1 g/dL (ref 3.5–5.0)
BILIRUBIN TOTAL: 0.7 mg/dL (ref 0.3–1.2)
BUN: 24 mg/dL — ABNORMAL HIGH (ref 6–20)
CALCIUM: 9.1 mg/dL (ref 8.9–10.3)
CO2: 27 mmol/L (ref 22–32)
CREATININE: 0.71 mg/dL (ref 0.44–1.00)
Chloride: 107 mmol/L (ref 101–111)
GFR calc non Af Amer: >60 mL/min (ref >60)
GLUCOSE: 133 mg/dL — AB (ref 65–99)
Potassium: 3.8 mmol/L (ref 3.5–5.1)
Sodium: 141 mmol/L (ref 135–145)
TOTAL PROTEIN: 7.4 g/dL (ref 6.5–8.1)

## 2016-08-04 LAB — CBC WITH DIFFERENTIAL/PLATELET
BASOS ABS: 0.2 10*3/uL — AB (ref 0–0.1)
Basophils Relative: 4 %
EOS ABS: 0 10*3/uL (ref 0–0.7)
EOS PCT: 1 %
HCT: 39.9 % (ref 35.0–47.0)
Hemoglobin: 13.9 g/dL (ref 12.0–16.0)
LYMPHS PCT: 25 %
Lymphs Abs: 1.2 10*3/uL (ref 1.0–3.6)
MCH: 31 pg (ref 26.0–34.0)
MCHC: 34.7 g/dL (ref 32.0–36.0)
MCV: 89.3 fL (ref 80.0–100.0)
MONO ABS: 0.3 10*3/uL (ref 0.2–0.9)
Monocytes Relative: 6 %
Neutro Abs: 3.1 10*3/uL (ref 1.4–6.5)
Neutrophils Relative %: 64 %
PLATELETS: 158 10*3/uL (ref 150–440)
RBC: 4.47 MIL/uL (ref 3.80–5.20)
RDW: 13.7 % (ref 11.5–14.5)
WBC: 4.8 10*3/uL (ref 3.6–11.0)

## 2016-08-04 MED ORDER — MECLIZINE HCL 12.5 MG PO TABS: 12.5000 mg | ORAL_TABLET | Freq: Three times a day (TID) | ORAL | 0 refills | Status: DC | PRN

## 2016-08-04 MED ORDER — NITROFURANTOIN MONOHYD MACRO 100 MG PO CAPS
100.0000 mg | ORAL_CAPSULE | Freq: Once | ORAL | Status: AC
  Administered 2016-08-04: 100 mg via ORAL
  Filled 2016-08-04: qty 1

## 2016-08-04 MED ORDER — MECLIZINE HCL 25 MG PO TABS
25.0000 mg | ORAL_TABLET | Freq: Once | ORAL | Status: AC
  Administered 2016-08-04: 25 mg via ORAL
  Filled 2016-08-04: qty 1

## 2016-08-04 MED ORDER — SODIUM CHLORIDE 0.9 % IV BOLUS (SEPSIS)
500.0000 mL | Freq: Once | INTRAVENOUS | Status: AC
  Administered 2016-08-04: 500 mL via INTRAVENOUS

## 2016-08-04 MED ORDER — NITROFURANTOIN MONOHYD MACRO 100 MG PO CAPS: 100.0000 mg | ORAL_CAPSULE | Freq: Two times a day (BID) | ORAL | 0 refills | Status: AC

## 2016-08-04 MED ORDER — ONDANSETRON HCL 4 MG/2ML IJ SOLN
4.0000 mg | Freq: Once | INTRAMUSCULAR | Status: AC
  Administered 2016-08-04: 4 mg via INTRAVENOUS
  Filled 2016-08-04: qty 2

## 2016-08-04 NOTE — ED Provider Notes (Signed)
[facility] Emergency Department Provider Note   ____________________________________________   First MD Initiated Contact with Patient 08/04/16 [phone]     (approximate)  I have reviewed the triage vital signs and the nursing notes.   HISTORY  Chief Complaint Hypertension and Dizziness    HPI Susan [name] is a 70 y.o. female with history of hypertension and hyperlipidemia as well as chronic nonalcoholic liver disease who presents for evaluation of intermittent room spinning dizziness and nausea gradual onset this morning, moderate, worse with movement of her head/position change. She denies any headache, vomiting, diarrhea, chest pain or difficulty breathing. She denies any vision change, numbness or weakness in the arms or legs or speech difficulty. She reports she took her blood pressure at home and her systolic blood pressure was 144 and that concerned her. She has had episodes of room spinning dizziness like this before secondary to an "inner ear thing" but her symptoms are more severe today. She denies any ear pain or drainage. She has had increased urinary frequency.   Past Medical History:  Diagnosis Date  . Colon cancer screening   . Diverticulosis of colon   . IBS (irritable bowel syndrome)   . 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 Migraine, unspecified, without mention of intractable migraine without mention of status migrainosus   . NASH (nonalcoholic steatohepatitis)   . Ocular migraine   . Osteoporosis    on Fosamax for 5 years  . Other abnormal glucose   . Other chronic nonalcoholic liver disease    LFTs normalized with weight loss 2013  . Other screening mammogram   . Symptomatic menopausal or female climacteric states   . Tubular adenoma of colon   . Urinary tract infection, site not specified     Patient Active Problem List   Diagnosis Date Noted  . HLD (hyperlipidemia) 07/23/2016  . Healthcare maintenance 07/23/2016  . Cystocele  05/19/2015  . Dupuytren contracture 03/24/2015  . Advance care planning 03/24/2015  . Benign paroxysmal positional vertigo 11/09/2014  . Anxiety state 11/09/2014  . Other fatigue 08/27/2014  . Multiple joint pain 07/09/2014  . Medicare annual wellness visit, initial 08/25/2013  . Abdominal pain, other specified site 08/25/2013  . Multiple thyroid nodules 08/06/2013  . Bilateral carotid artery stenosis 05/22/2013  . Memory changes 08/27/2012  . Lipoma 05/05/2011  . Disordered eating 05/05/2011  . B12 deficiency 11/18/2010  . IRRITABLE BOWEL SYNDROME 11/11/2010  . DIVERTICULOSIS, COLON 11/04/2010  . OTHER CHRONIC NONALCOHOLIC LIVER DISEASE 02/72/5366  . COLONIC POLYPS, HX OF 11/04/2010  . Hyperglycemia 08/12/2010  . MIGRAINE HEADACHE 12/27/2007  . Essential hypertension 12/27/2007  . MENOPAUSAL SYNDROME 12/27/2007  . Osteoporosis 11/05/2007  . LIVER FUNCTION TESTS, ABNORMAL, HX OF 05/06/2006    Past Surgical History:  Procedure Laterality Date  . abdominal ultrasound  04/12/2004 & 6/06   positive gallstones  . ANKLE FRACTURE SURGERY  ~2002   pinning  . BTL  30+ years ago  . CATARACT EXTRACTION  2013   B eyes  . COLONOSCOPY  06/08/06   Polyps, divertics (Dr. Sharlett Iles)  . CT abdomen  04/29/04   Positive gallstones  . TONSILLECTOMY AND ADENOIDECTOMY  Age 33  . ultrasound pelvis  6/06   Negative    Prior to Admission medications   Medication Sig Start Date End Date Taking? Authorizing Provider  acetaminophen (TYLENOL) 500 MG tablet Take 1,000 mg by mouth 2 (two) times daily.   Yes Historical Provider, MD  aspirin 81 MG  tablet Take 81 mg by mouth daily.   Yes Historical Provider, MD  Cholecalciferol 1000 units tablet Take 1 tablet by mouth once a week. 07/29/16  Yes Historical Provider, MD  clopidogrel (PLAVIX) 75 MG tablet Take 75 mg by mouth daily.   Yes Historical Provider, MD  lisinopril (PRINIVIL,ZESTRIL) 5 MG tablet Take 5 mg by mouth daily.   Yes Historical Provider, MD    vitamin B-12 (CYANOCOBALAMIN) 1000 MCG tablet Take 1,000 mcg by mouth daily.   Yes Historical Provider, MD  atorvastatin (LIPITOR) 40 MG tablet Take 40 mg by mouth daily at 6 PM.     Historical Provider, MD  meclizine (ANTIVERT) 12.5 MG tablet Take 1 tablet (12.5 mg total) by mouth 3 (three) times daily as needed for dizziness. 08/04/16   Joanne Gavel, MD  nitrofurantoin, macrocrystal-monohydrate, (MACROBID) 100 MG capsule Take 1 capsule (100 mg total) by mouth 2 (two) times daily. 08/04/16 08/11/16  Joanne Gavel, MD    Allergies Penicillins and Sulfonamide derivatives  Family History  Problem Relation Age of Onset  . Depression Mother     Manic depression  . Diabetes Mother     Type 2  . Breast cancer Mother   . Colon polyps Father   . Heart disease Father   . Breast cancer Maternal Aunt   . Breast cancer Paternal Aunt   . Cancer Neg Hx   . Colon cancer Neg Hx     Social History Social History  Substance Use Topics  . Smoking status: Former Smoker    Types: Cigarettes  . Smokeless tobacco: Never Used     Comment: Quit in 2001  . Alcohol use No     Comment: Occasional    Review of Systems Constitutional: No fever/chills Eyes: No visual changes. ENT: No sore throat. Cardiovascular: Denies chest pain. Respiratory: Denies shortness of breath. Gastrointestinal: No abdominal pain.  + nausea, no vomiting.  No diarrhea.  No constipation. Genitourinary: Negative for dysuria. Musculoskeletal: Negative for back pain. Skin: Negative for rash. Neurological: Negative for headaches, focal weakness or numbness.  10-point ROS otherwise negative.  ____________________________________________   PHYSICAL EXAM:  Vitals:   08/04/16 0851 08/04/16 0852 08/04/16 1030  BP: (!) 151/80  132/71  Pulse: 83  72  Resp: 18  18  Temp: 97.6 F (36.4 C)    TempSrc: Oral    SpO2: 99%  97%  Weight:  156 lb (70.8 kg)   Height:  5' 2"  (1.575 m)     VITAL SIGNS: ED Triage Vitals  Enc  Vitals Group     BP 08/04/16 0851 (!) 151/80     Pulse Rate 08/04/16 0851 83     Resp 08/04/16 0851 18     Temp 08/04/16 0851 97.6 F (36.4 C)     Temp Source 08/04/16 0851 Oral     SpO2 08/04/16 0851 99 %     Weight 08/04/16 0852 156 lb (70.8 kg)     Height 08/04/16 0852 5' 2"  (1.575 m)     Head Circumference --      Peak Flow --      Pain Score --      Pain Loc --      Pain Edu? --      Excl. in [place]? --     Constitutional: Alert and oriented. Well appearing and in no acute distress. Eyes: Conjunctivae are normal. PERRL. EOMI. Head: Atraumatic. Nose: No congestion/rhinnorhea. Ears: Scarring of the right TM is noted,  no purulence/bulging of the right TM. TM is clear on the left with normal landmarks Mouth/Throat: Mucous membranes are moist.  Oropharynx non-erythematous. Neck: No stridor.  Supple without meningismus. Cardiovascular: Normal rate, regular rhythm. Grossly normal heart sounds.  Good peripheral circulation. Respiratory: Normal respiratory effort.  No retractions. Lungs CTAB. Gastrointestinal: Soft and nontender. No distention.  No CVA tenderness. Genitourinary: deferred Musculoskeletal: No lower extremity tenderness nor edema.  No joint effusions. Neurologic:  Normal speech and language. No gross focal neurologic deficits are appreciated. 5 out of 5 strength in bilateral upper and lower extremities, sensation intact to light touch throughout, cranial nerves II through XII intact. Normal finger-nose-finger without dysmetria however the patient does develop severe room spinning dizziness when attempting to perform the maneuver. Skin:  Skin is warm, dry and intact. No rash noted. Psychiatric: Mood and affect are normal. Speech and behavior are normal.  ____________________________________________   LABS (all labs ordered are listed, but only abnormal results are displayed)  Labs Reviewed  CBC WITH DIFFERENTIAL/PLATELET - Abnormal; Notable for the following:        Result Value   Basophils Absolute 0.2 (*)    All other components within normal limits  COMPREHENSIVE METABOLIC PANEL - Abnormal; Notable for the following:    Glucose, Bld 133 (*)    BUN 24 (*)    All other components within normal limits  URINALYSIS COMPLETEWITH MICROSCOPIC ([facility] ONLY) - Abnormal; Notable for the following:    Color, Urine YELLOW (*)    APPearance HAZY (*)    Nitrite POSITIVE (*)    Leukocytes, UA 2+ (*)    Bacteria, UA MANY (*)    Squamous Epithelial / LPF 0-5 (*)    All other components within normal limits  URINE CULTURE   ____________________________________________  EKG  ED ECG REPORT I, Joanne Gavel, the attending physician, personally viewed and interpreted this ECG.   Date: 08/04/2016  EKG Time: 08:56  Rate: 73  Rhythm: normal sinus rhythm  Axis: normal  Intervals: Right bundle-branch block.  ST&T Change: No acute ST elevation or acute ST depression.  ____________________________________________  RADIOLOGY  none ____________________________________________   PROCEDURES  Procedure(s) performed: None  Procedures  Critical Care performed: No  ____________________________________________   INITIAL IMPRESSION / ASSESSMENT AND PLAN / ED COURSE  Pertinent labs & imaging results that were available during my care of the patient were reviewed by me and considered in my medical decision making (see chart for details).  Susan [name] is a 70 y.o. female with history of hypertension and hyperlipidemia as well as chronic nonalcoholic liver disease who presents for evaluation of intermittent room spinning dizziness and nausea gradual onset this morning, moderate, worse with movement of her head/position change. On exam, she is generally well-appearing and in no acute distress. Her vital signs are stable, she is afebrile. She has an intact neurological examination. she does have reproducible room spinning dizziness with finger-nose-finger  and her history and physical are consistent with benign positional vertigo. No evidence of acute CVA. She does have scarring of the right TM but no evidence to suggest acute otitis media. We'll obtain screening labs as well as urinalysis, treat symptomatically with IV fluids, meclizine, antiemetics and reassess for disposition.  ----------------------------------------- 11:38 AM on 08/04/2016 ----------------------------------------- Patient reports she feels much better after meclizine, she is no longer dizzy. She is tolerating by mouth intake without vomiting. CMP and CBC are generally unremarkable. Urinalysis is concerning for nitrite positive urinary tract infection, we'll treat  with Macrobid, GFR is greater than 60, urine culture sent. We discussed  return precautions, need for close PCP follow-up as she is comfortable with the discharge plan. DC home with Macrobid and meclizine.   Clinical Course     ____________________________________________   FINAL CLINICAL IMPRESSION(S) / ED DIAGNOSES  Final diagnoses:  Benign positional vertigo, unspecified laterality  Dizziness  Nausea  UTI (lower urinary tract infection)      NEW MEDICATIONS STARTED DURING THIS VISIT:  New Prescriptions   MECLIZINE (ANTIVERT) 12.5 MG TABLET    Take 1 tablet (12.5 mg total) by mouth 3 (three) times daily as needed for dizziness.   NITROFURANTOIN, MACROCRYSTAL-MONOHYDRATE, (MACROBID) 100 MG CAPSULE    Take 1 capsule (100 mg total) by mouth 2 (two) times daily.     Note:  This document was prepared using Dragon voice recognition software and may include unintentional dictation errors.    Joanne Gavel, MD 08/04/16 1139

## 2016-08-04 NOTE — ED Notes (Signed)
MD Edd Fabian at bedside.

## 2016-08-04 NOTE — ED Notes (Signed)
Pt verbalized understanding of of discharge instructions. NAD at this time.

## 2016-08-04 NOTE — ED Triage Notes (Signed)
Pt reports she woke up this am feeling dizzy so she checked her BP and it was SBP of 144. Pt reports she has also been feeling nauseous.

## 2016-08-07 ENCOUNTER — Ambulatory Visit (INDEPENDENT_AMBULATORY_CARE_PROVIDER_SITE_OTHER): Payer: Commercial Managed Care - HMO | Admitting: Family Medicine

## 2016-08-07 ENCOUNTER — Encounter: Payer: Self-pay | Admitting: Family Medicine

## 2016-08-07 VITALS — BP 120/80 | HR 93 | Temp 98.8°F | Wt 158.0 lb

## 2016-08-07 DIAGNOSIS — E785 Hyperlipidemia, unspecified: Secondary | ICD-10-CM

## 2016-08-07 DIAGNOSIS — N3001 Acute cystitis with hematuria: Secondary | ICD-10-CM

## 2016-08-07 DIAGNOSIS — H8110 Benign paroxysmal vertigo, unspecified ear: Secondary | ICD-10-CM | POA: Diagnosis not present

## 2016-08-07 LAB — POC URINALSYSI DIPSTICK (AUTOMATED)
BILIRUBIN UA: NEGATIVE
GLUCOSE UA: NEGATIVE
KETONES UA: NEGATIVE
Leukocytes, UA: NEGATIVE
Nitrite, UA: NEGATIVE
Protein, UA: NEGATIVE
RBC UA: NEGATIVE
SPEC GRAV UA: 1.015
Urobilinogen, UA: 0.2
pH, UA: 7

## 2016-08-07 LAB — URINE CULTURE: Special Requests: NORMAL

## 2016-08-07 MED ORDER — ATORVASTATIN CALCIUM 40 MG PO TABS: 20.0000 mg | ORAL_TABLET | ORAL | Status: DC

## 2016-08-07 NOTE — Patient Instructions (Addendum)
Try the atorvastatin 107m every day, then cut back to every other day if aches.  If still with aches at that point, then let me know.   Use the bedside exercise in the meantime and update me as needed.  Take care.  Glad to see you.

## 2016-08-07 NOTE — Progress Notes (Signed)
She clearly has fewer aches, now resolved, off lipitor.  See plan.  D/w pt about options.    ER f/u.  Vertigo.  She had episodic sx.  Much less vertigo sx in the meantime, "1000% better now".  Took meclizine a few times in the meantime.    Presumed UTI, now on treatment.  She didn't have dysuria.  Unclear if that was related, d/w pt.  U/a today d/w pt.    Meds, vitals, and allergies reviewed.   ROS: Per HPI unless specifically indicated in ROS section    GEN: nad, alert and oriented HEENT: mucous membranes moist, tm wnl, nasal and OP exam wnl NECK: supple w/o LA CV: rrr PULM: ctab, no inc wob ABD: soft, +bs EXT: no edema CN 2-12 wnl B, S/S/DTR wnl x4

## 2016-08-07 NOTE — Progress Notes (Signed)
Pre visit review using our clinic review tool, if applicable. No additional management support is needed unless otherwise documented below in the visit note. 

## 2016-08-08 NOTE — Assessment & Plan Note (Signed)
Urine culture positive. On appropriate antibiotics. No symptoms now. Nontoxic. Abdominal exam without tenderness. Finish antibiotics and follow-up as needed.

## 2016-08-08 NOTE — Assessment & Plan Note (Addendum)
Likely diagnosis. Pathophysiology discussed with patient. She can continue meclizine when necessary. Discussed with patient about home bedside exercises. >25 minutes spent in face to face time with patient, >50% spent in counselling or coordination of care

## 2016-08-08 NOTE — Assessment & Plan Note (Signed)
She can try cutting her Lipitor pills in half, down to 20 mg. She can try taking 51m every day. If she can tolerate that dose then continue as is. If she cannot tolerate that dose, then okay to take every other day. She agrees. She'll update me as needed.

## 2016-09-21 ENCOUNTER — Other Ambulatory Visit: Payer: Self-pay | Admitting: Family Medicine

## 2016-09-21 DIAGNOSIS — R0789 Other chest pain: Secondary | ICD-10-CM

## 2016-10-05 DIAGNOSIS — I2510 Atherosclerotic heart disease of native coronary artery without angina pectoris: Secondary | ICD-10-CM | POA: Diagnosis not present

## 2016-10-05 DIAGNOSIS — I10 Essential (primary) hypertension: Secondary | ICD-10-CM | POA: Diagnosis not present

## 2016-10-05 DIAGNOSIS — R0602 Shortness of breath: Secondary | ICD-10-CM | POA: Diagnosis not present

## 2016-10-09 ENCOUNTER — Encounter: Payer: Self-pay | Admitting: *Deleted

## 2016-10-09 ENCOUNTER — Encounter: Payer: Self-pay | Admitting: Family Medicine

## 2016-10-09 DIAGNOSIS — Z1231 Encounter for screening mammogram for malignant neoplasm of breast: Secondary | ICD-10-CM | POA: Diagnosis not present

## 2016-11-30 IMAGING — CR DG CHEST 2V
1 series · 2 of 2 positions shown · non-contrast
Comparison: July 06, 2006

CLINICAL DATA: One week history of chest tightness and weakness

EXAM:
CHEST  2 VIEW

[Series 1: w chest pa · 0.14mm/px · 2 of 2 slices shown]
[im 1/2]
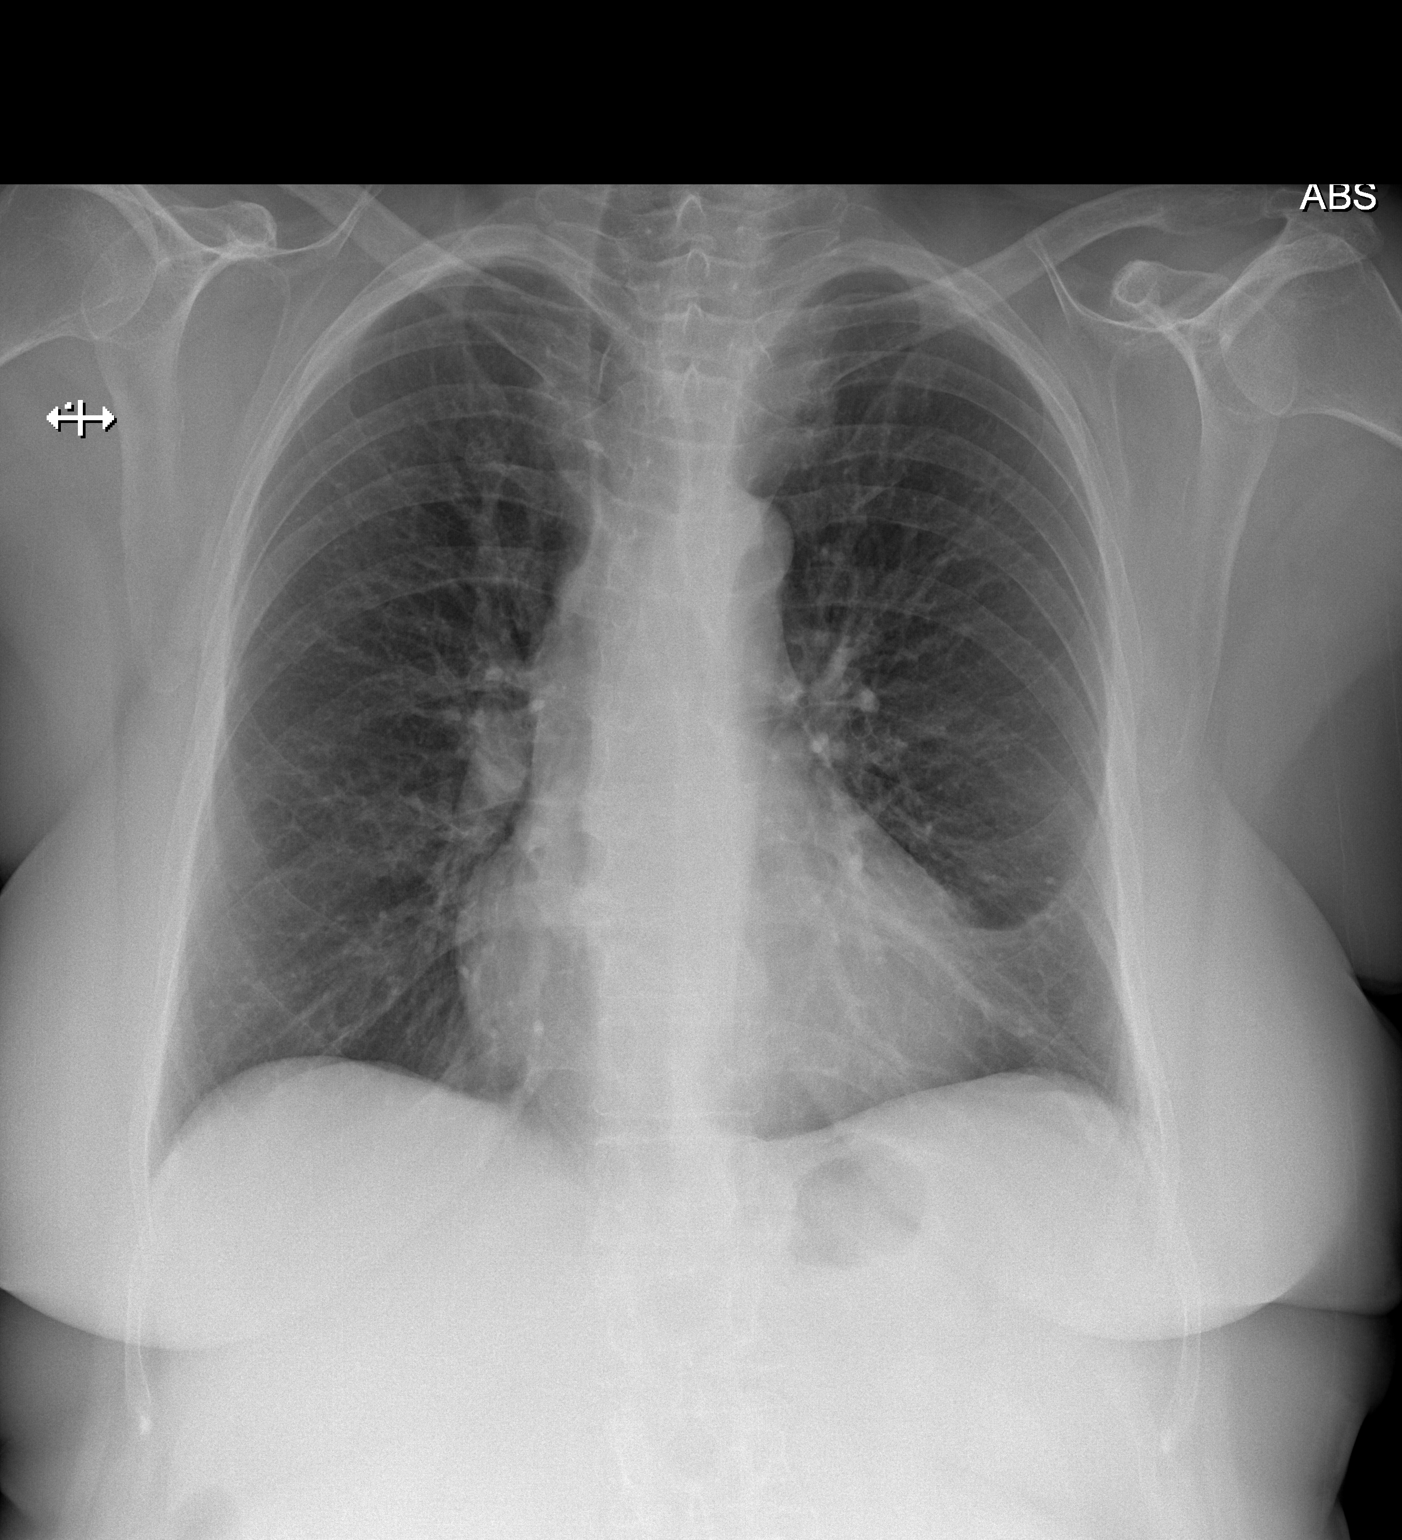
[im 2/2]
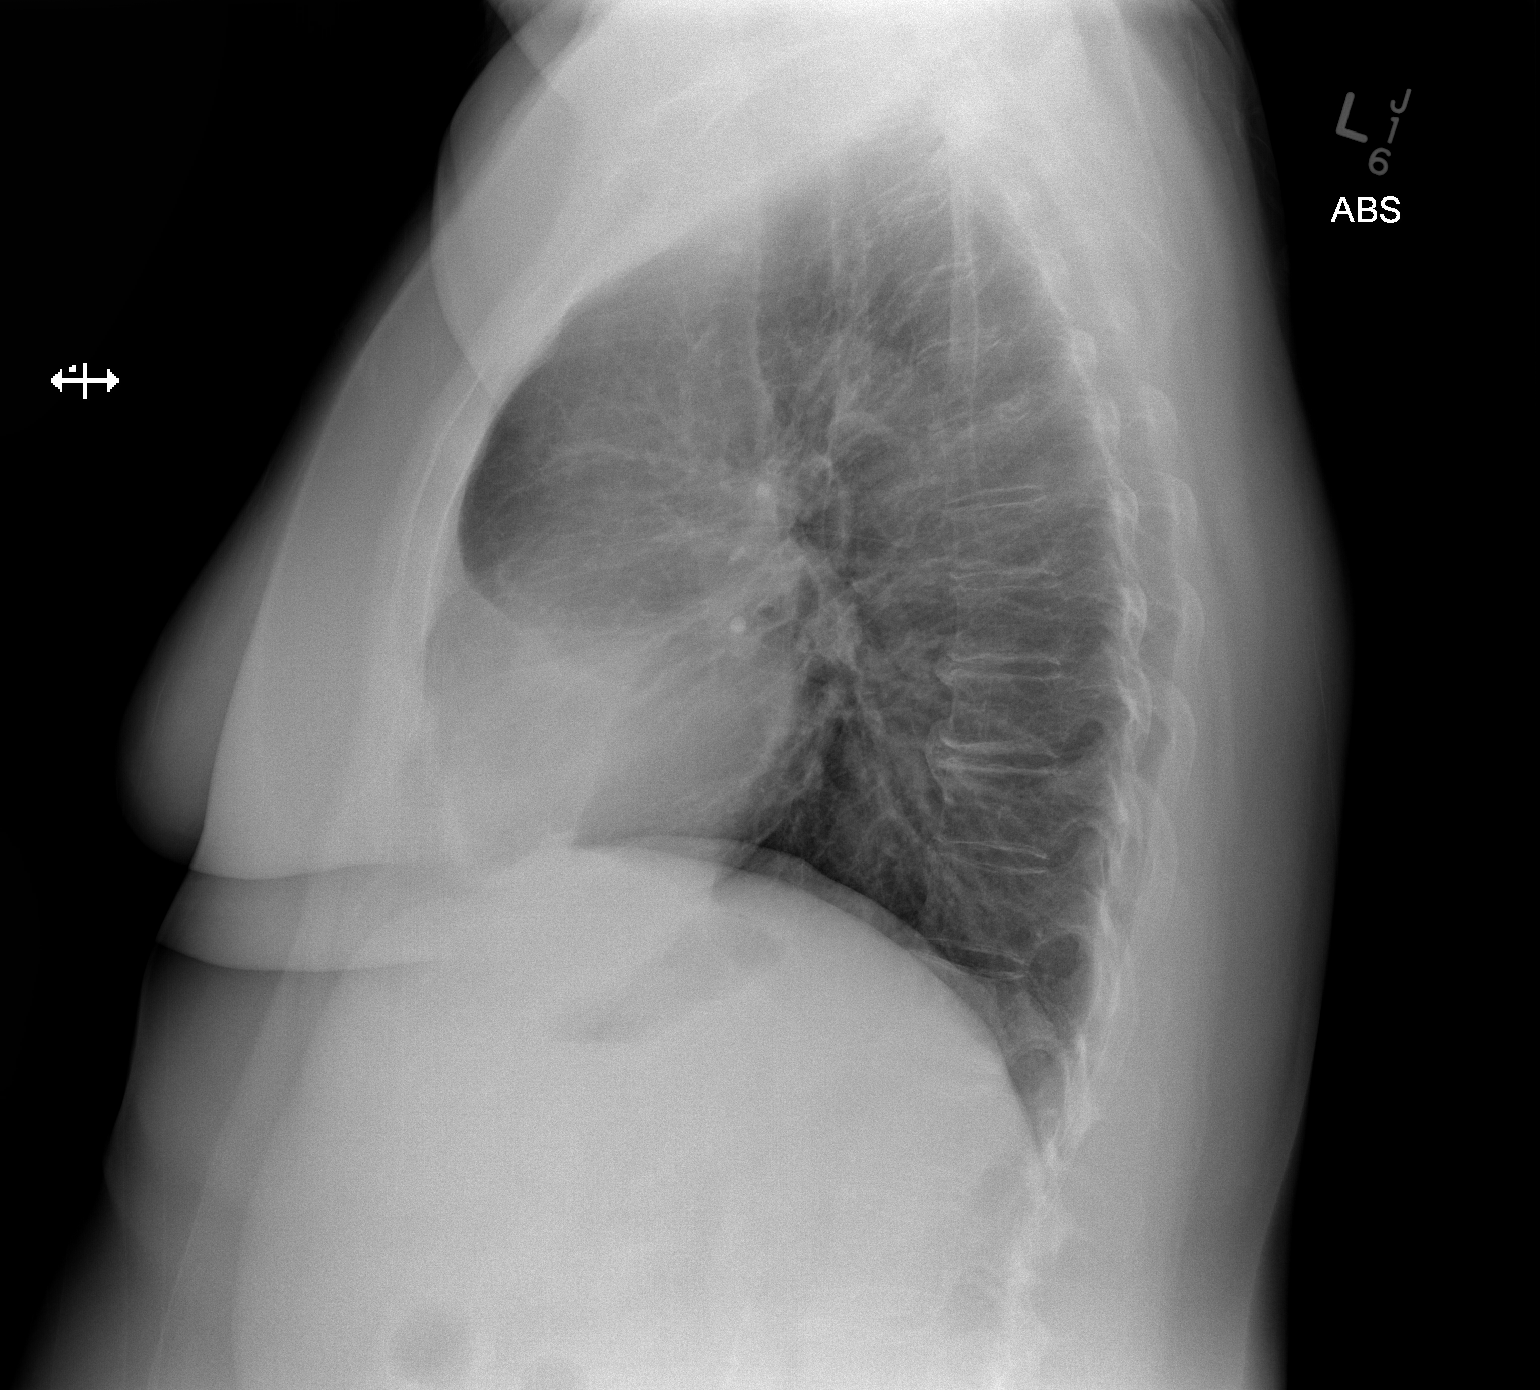

[2 of 2 positions shown; findings below may reference images not displayed]

FINDINGS: There is slight scarring in the left base. There is no edema or
consolidation. Heart size and pulmonary vascularity normal. No
adenopathy. There is mild rightward deviation of the upper thoracic
trachea.
IMPRESSION: No edema or consolidation. Mild rightward deviation of the upper
thoracic trachea. Question thyroid enlargement given this finding.

## 2017-01-01 DIAGNOSIS — I10 Essential (primary) hypertension: Secondary | ICD-10-CM | POA: Diagnosis not present

## 2017-01-01 DIAGNOSIS — I2510 Atherosclerotic heart disease of native coronary artery without angina pectoris: Secondary | ICD-10-CM | POA: Diagnosis not present

## 2017-01-01 DIAGNOSIS — R0602 Shortness of breath: Secondary | ICD-10-CM | POA: Diagnosis not present

## 2017-01-01 DIAGNOSIS — R002 Palpitations: Secondary | ICD-10-CM | POA: Diagnosis not present

## 2017-01-15 DIAGNOSIS — R002 Palpitations: Secondary | ICD-10-CM | POA: Diagnosis not present

## 2017-01-15 DIAGNOSIS — I10 Essential (primary) hypertension: Secondary | ICD-10-CM | POA: Diagnosis not present

## 2017-01-15 DIAGNOSIS — R0602 Shortness of breath: Secondary | ICD-10-CM | POA: Diagnosis not present

## 2017-01-15 DIAGNOSIS — I2510 Atherosclerotic heart disease of native coronary artery without angina pectoris: Secondary | ICD-10-CM | POA: Diagnosis not present

## 2017-02-26 ENCOUNTER — Encounter: Payer: Self-pay | Admitting: Family Medicine

## 2017-02-26 ENCOUNTER — Ambulatory Visit (INDEPENDENT_AMBULATORY_CARE_PROVIDER_SITE_OTHER): Payer: Medicare HMO | Admitting: Family Medicine

## 2017-02-26 VITALS — BP 142/80 | HR 80 | Temp 98.4°F | Wt 158.5 lb

## 2017-02-26 DIAGNOSIS — G629 Polyneuropathy, unspecified: Secondary | ICD-10-CM

## 2017-02-26 LAB — CBC WITH DIFFERENTIAL/PLATELET
BASOS PCT: 0.7 % (ref 0.0–3.0)
Basophils Absolute: 0 10*3/uL (ref 0.0–0.1)
EOS PCT: 1.1 % (ref 0.0–5.0)
Eosinophils Absolute: 0.1 10*3/uL (ref 0.0–0.7)
HCT: 42 % (ref 36.0–46.0)
Hemoglobin: 14 g/dL (ref 12.0–15.0)
Lymphocytes Relative: 38.5 % (ref 12.0–46.0)
Lymphs Abs: 1.8 10*3/uL (ref 0.7–4.0)
MCHC: 33.5 g/dL (ref 30.0–36.0)
MCV: 91.5 fl (ref 78.0–100.0)
MONO ABS: 0.4 10*3/uL (ref 0.1–1.0)
Monocytes Relative: 8.8 % (ref 3.0–12.0)
NEUTROS PCT: 50.9 % (ref 43.0–77.0)
Neutro Abs: 2.4 10*3/uL (ref 1.4–7.7)
PLATELETS: 188 10*3/uL (ref 150.0–400.0)
RBC: 4.59 Mil/uL (ref 3.87–5.11)
RDW: 13.8 % (ref 11.5–15.5)
WBC: 4.8 10*3/uL (ref 4.0–10.5)

## 2017-02-26 LAB — COMPREHENSIVE METABOLIC PANEL
ALBUMIN: 4.5 g/dL (ref 3.5–5.2)
ALT: 30 U/L (ref 0–35)
AST: 30 U/L (ref 0–37)
Alkaline Phosphatase: 41 U/L (ref 39–117)
BUN: 19 mg/dL (ref 6–23)
CHLORIDE: 106 meq/L (ref 96–112)
CO2: 29 mEq/L (ref 19–32)
Calcium: 9.6 mg/dL (ref 8.4–10.5)
Creatinine, Ser: 0.72 mg/dL (ref 0.40–1.20)
GFR: 84.71 mL/min (ref >60.00)
GLUCOSE: 120 mg/dL — AB (ref 70–99)
POTASSIUM: 4 meq/L (ref 3.5–5.1)
SODIUM: 141 meq/L (ref 135–145)
Total Bilirubin: 0.4 mg/dL (ref 0.2–1.2)
Total Protein: 7.4 g/dL (ref 6.0–8.3)

## 2017-02-26 LAB — HEMOGLOBIN A1C: Hgb A1c MFr Bld: 6 % (ref 4.6–6.5)

## 2017-02-26 LAB — TSH: TSH: 2.3 u[IU]/mL (ref 0.35–4.50)

## 2017-02-26 LAB — VITAMIN B12: Vitamin B-12: 894 pg/mL (ref 211–911)

## 2017-02-26 NOTE — Progress Notes (Signed)
Her granddaughter plays softball and was recently selected for the player of the week and is going to [employer] for the award.  Pt is understandably proud.    Now with tingling in the hands and feet.  She has some hand pain from arthritis but this seems different.  Hand sx were prev worse only with driving but they would improve, at least in the past.  Now it is getting worse and moving up the forearms.  She has some tingling in the feet B.  Both hand and foot sx started about the same time.  Both are gradually getting worse.  No weakness.  No neuropathic pain sx.    Her balance isn't as good as prev but w/o any falls except for an episode when a stool collapsed under her.    No h/o DM2.  Already taking B12.    PMH and SH reviewed  ROS: Per HPI unless specifically indicated in ROS section   Meds, vitals, and allergies reviewed.   GEN: nad, alert and oriented HEENT: mucous membranes moist NECK: supple w/o LA CV: rrr. PULM: ctab, no inc wob ABD: soft, +bs EXT: no edema SKIN: no acute rash CN 2-12 wnl B, S/S/DTR wnl except for dec sens to monofilament in the ext x4, worse distally.  Normal sens to vibration.  Normal romberg testing.   Normal cerebellar testing.

## 2017-02-26 NOTE — Progress Notes (Signed)
Pre visit review using our clinic review tool, if applicable. No additional management support is needed unless otherwise documented below in the visit note. 

## 2017-02-26 NOTE — Patient Instructions (Signed)
Go to the lab on the way out.  We'll contact you with your lab report. Take care.  Glad to see you.  We may need to get you over the neurology clinic.

## 2017-02-27 DIAGNOSIS — G629 Polyneuropathy, unspecified: Secondary | ICD-10-CM | POA: Insufficient documentation

## 2017-02-27 NOTE — Assessment & Plan Note (Addendum)
New problem. Unclear source. Differential diagnosis discussed with patient. Needs workup. Check routine labs today. No change in medications at this point. We will update her when we have her labs resulted and reviewed. She may end up needing referral to neurology. Discussed with patient. She agrees. No weakness. Okay for outpatient follow-up.

## 2017-03-02 ENCOUNTER — Other Ambulatory Visit: Payer: Self-pay | Admitting: Family Medicine

## 2017-03-02 DIAGNOSIS — G629 Polyneuropathy, unspecified: Secondary | ICD-10-CM

## 2017-03-19 ENCOUNTER — Ambulatory Visit (INDEPENDENT_AMBULATORY_CARE_PROVIDER_SITE_OTHER): Payer: Medicare HMO | Admitting: Diagnostic Neuroimaging

## 2017-03-19 ENCOUNTER — Encounter: Payer: Self-pay | Admitting: Diagnostic Neuroimaging

## 2017-03-19 VITALS — BP 152/69 | HR 72 | Ht 61.0 in | Wt 159.0 lb

## 2017-03-19 DIAGNOSIS — R202 Paresthesia of skin: Secondary | ICD-10-CM

## 2017-03-19 DIAGNOSIS — R200 Anesthesia of skin: Secondary | ICD-10-CM | POA: Diagnosis not present

## 2017-03-19 DIAGNOSIS — R269 Unspecified abnormalities of gait and mobility: Secondary | ICD-10-CM
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
## 2017-03-26 ENCOUNTER — Ambulatory Visit: Payer: Medicare HMO | Attending: Diagnostic Neuroimaging | Admitting: Physical Therapy

## 2017-03-26 DIAGNOSIS — R202 Paresthesia of skin: Secondary | ICD-10-CM | POA: Diagnosis not present

## 2017-03-26 DIAGNOSIS — R269 Unspecified abnormalities of gait and mobility: Secondary | ICD-10-CM | POA: Diagnosis not present

## 2017-03-26 DIAGNOSIS — R200 Anesthesia of skin: Secondary | ICD-10-CM | POA: Diagnosis not present

## 2017-03-26 DIAGNOSIS — M6281 Muscle weakness (generalized): Secondary | ICD-10-CM

## 2017-03-27 ENCOUNTER — Encounter: Payer: Self-pay | Admitting: Physical Therapy

## 2017-03-27 NOTE — Therapy (Signed)
[facility] [address] [place], [place], [address] Phone: [phone]   Fax:  [phone]  Physical Therapy Evaluation  Patient Details  Name: Susan [name] MRN: [id number] Date of Birth: 10/02/1946 Referring Provider: Dr. Leta Baptist  Encounter Date: 03/26/2017      PT End of Session - 03/27/17 1728    Visit Number 1   Number of Visits 5   Date for PT Re-Evaluation 04/23/17   Authorization - Visit Number 1   Authorization - Number of Visits 10   PT Start Time 0093   PT Stop Time 0939   PT Time Calculation (min) 52 min   Activity Tolerance Patient tolerated treatment well   Behavior During Therapy [facility] for tasks assessed/performed      Past Medical History:  Diagnosis Date  . Colon cancer screening   . Diverticulosis of colon   . IBS (irritable bowel syndrome)   . 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 Migraine, unspecified, without mention of intractable migraine without mention of status migrainosus   . NASH (nonalcoholic steatohepatitis)   . Ocular migraine   . Osteoporosis    on Fosamax for 5 years  . Other abnormal glucose   . Other chronic nonalcoholic liver disease    LFTs normalized with weight loss 2013  . Other screening mammogram   . Symptomatic menopausal or female climacteric states   . Tubular adenoma of colon   . Urinary tract infection, site not specified     Past Surgical History:  Procedure Laterality Date  . abdominal ultrasound  04/12/2004 & 6/06   positive gallstones  . ANKLE FRACTURE SURGERY  ~2002   pinning  . BTL  30+ years ago  . CATARACT EXTRACTION  2013   B eyes  . COLONOSCOPY  06/08/06   Polyps, divertics (Dr. Sharlett Iles)  . CT abdomen  04/29/04   Positive gallstones  . TONSILLECTOMY AND ADENOIDECTOMY  Age 56  . ultrasound pelvis  6/06   Negative    There were no vitals filed for this visit.       Subjective Assessment - 03/27/17 1226    Subjective Pt. reports h/o R ankle fx in  2012 (surgery) and more recent c/o numbness in B feet/hands.  Pt. states she had gel nails applied and removed after 3 months (3/18) with worsening numbness in finger tips.     Pertinent History Pt. watches 47 month old grandkid 2 days/week (Wed. and Thursday).     Limitations Lifting;Walking;House hold activities   How long can you walk comfortably? pt. states she can't squat due to ankle pain/ arthritis   Diagnostic tests Pt. has NCV scheduled for 04/10/17   Patient Stated Goals Decrease numbness/tingline in hand and feet.  Improve balance/ decrease fall risk.     Currently in Pain? No/denies            [facility] PT Assessment - 03/27/17 0001      Assessment   Medical Diagnosis Numb/tingling hands and feet.  Gait difficulty   Referring Provider Dr. Leta Baptist   Onset Date/Surgical Date 01/04/17   Next MD Visit --  04/10/17   Prior Therapy no     Balance Screen   Has the patient fallen in the past 6 months Yes   How many times? --  yes   Has the patient had a decrease in activity level because of a fear of falling?  Yes      Green theraputty program.  Discussed proper use of [facility]      PT Education - 03/27/17 1728    Education provided Yes   Education Details Green putty ex.   Person(s) Educated Patient   Methods Explanation;Demonstration   Comprehension Verbalized understanding;Returned demonstration             PT Long Term Goals - 03/27/17 1740      PT LONG TERM GOAL #1   Title Pt. I with HEP to increase B UE/LE muscle strength grossly 5/5 MMT to improve functional mobility.     Baseline B UE/LE strength grossly 4+/5 MMT.     Time 4   Period Weeks   Status New     PT LONG TERM GOAL #2   Title Pt. will increase LEFS to >60 out of 80 to improve pain-free mobility.     Baseline LEFS: 53 out of 80 on 2023-04-12   Time 4   Period Weeks   Status New     PT LONG TERM GOAL #3   Title Pt. will increase Berg balance test to >50/56 to improve balance/ decrease fall risk    Baseline Berg: 46/56 on 2023-04-12 (recommend use of SPC).     Time 4   Period Weeks   Status New     PT LONG TERM GOAL #4   Title Pt. will report 50% improvement in B hand/feet numbness since start of PT to improve grasp/ decrease dropping objects.     Baseline pt. c/o constant numbness/tingline in B hands and feet.    Time 4   Period Weeks   Status New            Plan - 03/27/17 1731    Clinical Impression Statement Pt. is a 71 y/o female with chronic h/o B hand/feet numbness and tingling.  Pt. reports balance/ gait issues resulting in high fall risk.  Pt. presents with good B shoulder/LE AROM all planes.  B UE/LE muscle strength grossly 4+/5 MMT.  Pt. does not participate with an exercise or walking program.  Decrease pinch grip L/R: key 10#/10#, 2-jaw 3.5#/4#, 3-jaw 5#/5#, grip strength: 45#/47#.  Pt. reports dropping objects frequently at home but able to demonstrate nut/bolt assembly in clinic today.  Berg balance test: 46/56 (discussed use of SPC).  LEFS: 53 out 80.  Pt. ambulates with R antalgic gait secondary to R ankle pain/ joint stiffness.  Pt. will benefit from skilled PT services to develop ex. program and improve balance/ gait to decrease fall risk.     Rehab Potential Fair   PT Frequency 1x / week   PT Duration 4 weeks   PT Treatment/Interventions ADLs/Self Care Home Management;Cryotherapy;Electrical Stimulation;Moist Heat;Balance training;Therapeutic exercise;Therapeutic activities;Functional mobility training;Stair training;Gait training;Neuromuscular re-education;Patient/family education;Passive range of motion;Manual techniques   PT Next Visit Plan Reassess numbness/tingling.  Progress balance program.  SPC use.     PT Home Exercise Plan putty ex.     Consulted and Agree with Plan of Care Patient      Patient will benefit from skilled therapeutic intervention in order to improve the following deficits and impairments:  Abnormal gait, Impaired sensation, Pain, Decreased  mobility, Postural dysfunction, Hypomobility, Decreased strength, Decreased range of motion, Decreased endurance, Decreased activity tolerance, Decreased balance, Difficulty walking  Visit Diagnosis: Gait difficulty  Muscle weakness (generalized)  Numbness and tingling in both hands  Numbness and tingling of both feet      G-Codes - 04/11/2017 1746    Functional Assessment Tool Used (Outpatient Only) Clinical  impression/ LEFS/ Berg/ gait difficulty/ muscle weakness/ numbness and tingling   Functional Limitation Mobility: Walking and moving around   Mobility: Walking and Moving Around Current Status [phone]) At least 40 percent but less than 60 percent impaired, limited or restricted   Mobility: Walking and Moving Around Goal Status [phone]) At least 1 percent but less than 20 percent impaired, limited or restricted       Problem List Patient Active Problem List   Diagnosis Date Noted  . Neuropathy 02/27/2017  . HLD (hyperlipidemia) 07/23/2016  . Healthcare maintenance 07/23/2016  . Cystocele 05/19/2015  . Dupuytren contracture 03/24/2015  . Advance care planning 03/24/2015  . Benign paroxysmal positional vertigo 11/09/2014  . Anxiety state 11/09/2014  . Other fatigue 08/27/2014  . Multiple joint pain 07/09/2014  . Medicare annual wellness visit, initial 08/25/2013  . Abdominal pain, other specified site 08/25/2013  . Multiple thyroid nodules 08/06/2013  . Bilateral carotid artery stenosis 05/22/2013  . Memory changes 08/27/2012  . Lipoma 05/05/2011  . Disordered eating 05/05/2011  . B12 deficiency 11/18/2010  . IRRITABLE BOWEL SYNDROME 11/11/2010  . DIVERTICULOSIS, COLON 11/04/2010  . OTHER CHRONIC NONALCOHOLIC LIVER DISEASE 09/81/1914  . COLONIC POLYPS, HX OF 11/04/2010  . Hyperglycemia 08/12/2010  . UTI (urinary tract infection) 09/11/2008  . MIGRAINE HEADACHE 12/27/2007  . Essential hypertension 12/27/2007  . MENOPAUSAL SYNDROME 12/27/2007  . Osteoporosis 11/05/2007   . LIVER FUNCTION TESTS, ABNORMAL, HX OF 05/06/2006   Pura Spice, PT, DPT # [phone] 03/27/2017, 5:47 PM  [facility] [address] [place], [place], [address] Phone: [phone]   Fax:  [phone]  Name: ZAMORA COLTON MRN: [id number] Date of Birth: 1946/10/30

## 2017-04-03 ENCOUNTER — Ambulatory Visit: Payer: Medicare HMO | Admitting: Physical Therapy

## 2017-04-04 ENCOUNTER — Ambulatory Visit: Payer: Medicare HMO | Admitting: Physical Therapy

## 2017-04-04 DIAGNOSIS — R202 Paresthesia of skin: Secondary | ICD-10-CM

## 2017-04-04 DIAGNOSIS — R269 Unspecified abnormalities of gait and mobility: Secondary | ICD-10-CM

## 2017-04-04 DIAGNOSIS — M6281 Muscle weakness (generalized): Secondary | ICD-10-CM

## 2017-04-04 DIAGNOSIS — R200 Anesthesia of skin: Secondary | ICD-10-CM | POA: Diagnosis not present

## 2017-04-05 NOTE — Therapy (Addendum)
[facility] [address] [place], [place], [address] Phone: [phone]   Fax:  [phone]  Physical Therapy Treatment  Patient Details  Name: Susan [name] MRN: [id number] Date of Birth: 1946/01/29 Referring Provider: Dr. Leta Baptist  Encounter Date: 04/04/2017      PT End of Session - 04/05/17 1757    Visit Number 2   Number of Visits 5   Date for PT Re-Evaluation 04/23/17   Authorization - Visit Number 2   Authorization - Number of Visits 10   PT Start Time 1017   PT Stop Time 1635   PT Time Calculation (min) 53 min   Activity Tolerance Patient tolerated treatment well   Behavior During Therapy [facility] for tasks assessed/performed      Past Medical History:  Diagnosis Date  . Colon cancer screening   . Diverticulosis of colon   . IBS (irritable bowel syndrome)   . 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 Migraine, unspecified, without mention of intractable migraine without mention of status migrainosus   . NASH (nonalcoholic steatohepatitis)   . Ocular migraine   . Osteoporosis    on Fosamax for 5 years  . Other abnormal glucose   . Other chronic nonalcoholic liver disease    LFTs normalized with weight loss 2013  . Other screening mammogram   . Symptomatic menopausal or female climacteric states   . Tubular adenoma of colon   . Urinary tract infection, site not specified     Past Surgical History:  Procedure Laterality Date  . abdominal ultrasound  04/12/2004 & 6/06   positive gallstones  . ANKLE FRACTURE SURGERY  ~2002   pinning  . BTL  30+ years ago  . CATARACT EXTRACTION  2013   B eyes  . COLONOSCOPY  06/08/06   Polyps, divertics (Dr. Sharlett Iles)  . CT abdomen  04/29/04   Positive gallstones  . TONSILLECTOMY AND ADENOIDECTOMY  Age 71  . ultrasound pelvis  6/06   Negative    There were no vitals filed for this visit.      Subjective Assessment - 04/05/17 1757    Pertinent History Pt. watches 61 month old  grandkid 2 days/week (Wed. and Thursday).     Limitations Lifting;Walking;House hold activities   How long can you walk comfortably? pt. states she can't squat due to ankle pain/ arthritis   Diagnostic tests Pt. has NCV scheduled for 04/10/17   Patient Stated Goals Decrease numbness/tingling in hand and feet.  Improve balance/ decrease fall risk.     Currently in Pain? No/denies        Pt. reports numbness in fingers using putty  (PT discussed symptoms of CTS)- pt. has NCV scheduled.        OBJECTIVE:  There.ex.:  Nustep L6  B UE/LE 10 min.  (warm-up/no charge)- consistent cadence/ no increase symptoms.  Supine SLR/ hip marching/bicycles/ bridging.  Reviewed/discussed HEP.  Gait training:  Walking in hallway with [place]. Instruction to correct upright posture/ head position and increase R arm swing.  Ascend/descending stairs with step to pattern instruction and use of SPC.   Neuro:  Standing cone taps/ functional reaching with/ without Airex standing tasks (working on fine motor/ manual dexterity).  Alternating 6" step touches in //-bars with min. To no UE assist.  Lateral walking in //-bars (correction of foot position to midline) with upright posture and min. UE assist (mirror feedback)- 10x.  Tandem gait in //-bars (blue line) with light UE assist (  forward and backwards).                Pt response for medical necessity:  Pt. Benefits from skilled PT services to increase B LE muscle strength to improve balance/ gait with least assistive device.        Difficulty with R LE behind L (LOB) but no issues with L behind R.  Pt. benefits from use of SPC with proper gait pattern in clinic.  Pt. didn't drop anything during tx. but reports persistent numbness/ discomfort in fingers/hand.          PT Long Term Goals - 03/27/17 1740      PT LONG TERM GOAL #1   Title Pt. I with HEP to increase B UE/LE muscle strength grossly 5/5 MMT to improve functional mobility.     Baseline B UE/LE strength  grossly 4+/5 MMT.     Time 4   Period Weeks   Status New     PT LONG TERM GOAL #2   Title Pt. will increase LEFS to >60 out of 80 to improve pain-free mobility.     Baseline LEFS: 53 out of 80 on 5/21   Time 4   Period Weeks   Status New     PT LONG TERM GOAL #3   Title Pt. will increase Berg balance test to >50/56 to improve balance/ decrease fall risk   Baseline Berg: 46/56 on 5/21 (recommend use of SPC).     Time 4   Period Weeks   Status New     PT LONG TERM GOAL #4   Title Pt. will report 50% improvement in B hand/feet numbness since start of PT to improve grasp/ decrease dropping objects.     Baseline pt. c/o constant numbness/tingline in B hands and feet.    Time 4   Period Weeks   Status New               Plan - 04/05/17 1758    Clinical Presentation Stable   Clinical Decision Making Moderate   Rehab Potential Fair   PT Frequency 1x / week   PT Duration 4 weeks   PT Treatment/Interventions ADLs/Self Care Home Management;Cryotherapy;Electrical Stimulation;Moist Heat;Balance training;Therapeutic exercise;Therapeutic activities;Functional mobility training;Stair training;Gait training;Neuromuscular re-education;Patient/family education;Passive range of motion;Manual techniques   PT Next Visit Plan Reassess numbness/tingling.  Progress balance program.  SPC use.     PT Home Exercise Plan putty ex.     Consulted and Agree with Plan of Care Patient      Patient will benefit from skilled therapeutic intervention in order to improve the following deficits and impairments:  Abnormal gait, Impaired sensation, Pain, Decreased mobility, Postural dysfunction, Hypomobility, Decreased strength, Decreased range of motion, Decreased endurance, Decreased activity tolerance, Decreased balance, Difficulty walking  Visit Diagnosis: Gait difficulty  Muscle weakness (generalized)  Numbness and tingling in both hands  Numbness and tingling of both feet     Problem  List Patient Active Problem List   Diagnosis Date Noted  . Neuropathy 02/27/2017  . HLD (hyperlipidemia) 07/23/2016  . Healthcare maintenance 07/23/2016  . Cystocele 05/19/2015  . Dupuytren contracture 03/24/2015  . Advance care planning 03/24/2015  . Benign paroxysmal positional vertigo 11/09/2014  . Anxiety state 11/09/2014  . Other fatigue 08/27/2014  . Multiple joint pain 07/09/2014  . Medicare annual wellness visit, initial 08/25/2013  . Abdominal pain, other specified site 08/25/2013  . Multiple thyroid nodules 08/06/2013  . Bilateral carotid artery stenosis 05/22/2013  . Memory  changes 08/27/2012  . Lipoma 05/05/2011  . Disordered eating 05/05/2011  . B12 deficiency 11/18/2010  . IRRITABLE BOWEL SYNDROME 11/11/2010  . DIVERTICULOSIS, COLON 11/04/2010  . OTHER CHRONIC NONALCOHOLIC LIVER DISEASE 50/72/2575  . COLONIC POLYPS, HX OF 11/04/2010  . Hyperglycemia 08/12/2010  . UTI (urinary tract infection) 09/11/2008  . MIGRAINE HEADACHE 12/27/2007  . Essential hypertension 12/27/2007  . MENOPAUSAL SYNDROME 12/27/2007  . Osteoporosis 11/05/2007  . LIVER FUNCTION TESTS, ABNORMAL, HX OF 05/06/2006   Pura Spice, PT, DPT # [id number] 04/05/2017, 5:59 PM  [place] [facility] [address] [place], [place], [address] Phone: [phone]   Fax:  [phone]  Name: Susan [name] MRN: [id number] Date of Birth: 04-08-1946

## 2017-04-09 ENCOUNTER — Ambulatory Visit: Payer: Medicare HMO | Attending: Diagnostic Neuroimaging | Admitting: Physical Therapy

## 2017-04-09 DIAGNOSIS — R269 Unspecified abnormalities of gait and mobility: Secondary | ICD-10-CM | POA: Diagnosis not present

## 2017-04-09 DIAGNOSIS — R200 Anesthesia of skin: Secondary | ICD-10-CM | POA: Diagnosis not present

## 2017-04-09 DIAGNOSIS — R202 Paresthesia of skin: Secondary | ICD-10-CM | POA: Insufficient documentation

## 2017-04-09 DIAGNOSIS — M6281 Muscle weakness (generalized): Secondary | ICD-10-CM

## 2017-04-09 NOTE — Therapy (Signed)
[facility] [address] [place], [place], [address] Phone: [phone]   Fax:  [phone]  Physical Therapy Treatment  Patient Details  Name: Susan [name] MRN: [id number] Date of Birth: 09-Oct-1946 Referring Provider: Dr. Leta Baptist  Encounter Date: 04/09/2017      PT End of Session - 04/09/17 0813    Visit Number 3   Number of Visits 5   Date for PT Re-Evaluation 04/23/17   Authorization - Visit Number 3   Authorization - Number of Visits 10   PT Start Time 0812   PT Stop Time 0906   PT Time Calculation (min) 54 min   Activity Tolerance Patient tolerated treatment well   Behavior During Therapy [facility] for tasks assessed/performed      Past Medical History:  Diagnosis Date  . Colon cancer screening   . Diverticulosis of colon   . IBS (irritable bowel syndrome)   . 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 Migraine, unspecified, without mention of intractable migraine without mention of status migrainosus   . NASH (nonalcoholic steatohepatitis)   . Ocular migraine   . Osteoporosis    on Fosamax for 5 years  . Other abnormal glucose   . Other chronic nonalcoholic liver disease    LFTs normalized with weight loss 2013  . Other screening mammogram   . Symptomatic menopausal or female climacteric states   . Tubular adenoma of colon   . Urinary tract infection, site not specified     Past Surgical History:  Procedure Laterality Date  . abdominal ultrasound  04/12/2004 & 6/06   positive gallstones  . ANKLE FRACTURE SURGERY  ~2002   pinning  . BTL  30+ years ago  . CATARACT EXTRACTION  2013   B eyes  . COLONOSCOPY  06/08/06   Polyps, divertics (Dr. Sharlett Iles)  . CT abdomen  04/29/04   Positive gallstones  . TONSILLECTOMY AND ADENOIDECTOMY  Age 47  . ultrasound pelvis  6/06   Negative    There were no vitals filed for this visit.      Subjective Assessment - 04/09/17 0811    Subjective Tomorrow is scheduled NCV.  Pt.  still limited with numbness/ tingling in B fingers.  R ankle aching after shopping yesterday.     Pertinent History Pt. watches 2 month old grandkid 2 days/week (Wed. and Thursday).     Limitations Lifting;Walking;House hold activities   How long can you walk comfortably? pt. states she can't squat due to ankle pain/ arthritis   Diagnostic tests Pt. has NCV scheduled for 04/10/17   Patient Stated Goals Decrease numbness/tingling in hand and feet.  Improve balance/ decrease fall risk.     Currently in Pain? No/denies      R ankle joint stiffness (previous surgery).    OBJECTIVE:  There.ex.:  Nustep L6  B UE/LE 10 min.  (warm-up/no charge)- consistent cadence/ no increase symptoms.  Discussed HEP.  Supine SLR/ hip marching/bicycles/ bridging (2 episodes of hamstring cramping during bridging, resolved quickly with stretches).  Gait training:  Walking in hallway with [place]. Instruction to correct upright posture/ head position and increase R arm swing.  Ascend/descending stairs with step to pattern instruction and use of SPC.   Neuro:  Airex standing tasks with cone reaching/ clothespins/ nut and bolt assembly (working on fine motor/ manual dexterity).  Alternating 6" step touches in //-bars with min. To no UE assist.  Lateral walking in //-bars (correction of foot position to midline)  with upright posture and min. UE assist (mirror feedback)- 10x.  Tandem gait in //-bars (blue line) with light UE assist (forward and backwards).  Rechecked B LE MMT.         Pt response for medical necessity:  Pt. Benefits from skilled PT services to increase B LE muscle strength to improve balance/ gait with least assistive device.          PT Long Term Goals - 03/27/17 1740      PT LONG TERM GOAL #1   Title Pt. I with HEP to increase B UE/LE muscle strength grossly 5/5 MMT to improve functional mobility.     Baseline B UE/LE strength grossly 4+/5 MMT.     Time 4   Period Weeks   Status New     PT LONG TERM  GOAL #2   Title Pt. will increase LEFS to >60 out of 80 to improve pain-free mobility.     Baseline LEFS: 53 out of 80 on 5/21   Time 4   Period Weeks   Status New     PT LONG TERM GOAL #3   Title Pt. will increase Berg balance test to >50/56 to improve balance/ decrease fall risk   Baseline Berg: 46/56 on 5/21 (recommend use of SPC).     Time 4   Period Weeks   Status New     PT LONG TERM GOAL #4   Title Pt. will report 50% improvement in B hand/feet numbness since start of PT to improve grasp/ decrease dropping objects.     Baseline pt. c/o constant numbness/tingline in B hands and feet.    Time 4   Period Weeks   Status New           Plan - 04/09/17 1610    Clinical Impression Statement Pt. works really hard during PT tx. session.  No episodes of dropping small nuts/bolts during manual dexerity.  Marked improvement in 2-point gait pattern with upright posture and use of SPC in clinic.  Pt. ambulates with more consistent gait pattern with limited R arm swing and occasional cuing to correct posture/ head position.  Pt. will benefit from continued focus on B LE muscle strengthening/ balance training to improve Berg balance test.     Clinical Presentation Stable   Clinical Decision Making Moderate   Rehab Potential Fair   PT Frequency 1x / week   PT Duration 4 weeks   PT Treatment/Interventions ADLs/Self Care Home Management;Cryotherapy;Electrical Stimulation;Moist Heat;Balance training;Therapeutic exercise;Therapeutic activities;Functional mobility training;Stair training;Gait training;Neuromuscular re-education;Patient/family education;Passive range of motion;Manual techniques   PT Next Visit Plan Discuss NCV test results (6/5).   Progress balance program/ safety with SPC.     PT Home Exercise Plan putty ex.     Consulted and Agree with Plan of Care Patient      Patient will benefit from skilled therapeutic intervention in order to improve the following deficits and  impairments:  Abnormal gait, Impaired sensation, Pain, Decreased mobility, Postural dysfunction, Hypomobility, Decreased strength, Decreased range of motion, Decreased endurance, Decreased activity tolerance, Decreased balance, Difficulty walking  Visit Diagnosis: Gait difficulty  Muscle weakness (generalized)  Numbness and tingling in both hands  Numbness and tingling of both feet     Problem List Patient Active Problem List   Diagnosis Date Noted  . Neuropathy 02/27/2017  . HLD (hyperlipidemia) 07/23/2016  . Healthcare maintenance 07/23/2016  . Cystocele 05/19/2015  . Dupuytren contracture 03/24/2015  . Advance care planning 03/24/2015  . Benign  paroxysmal positional vertigo 11/09/2014  . Anxiety state 11/09/2014  . Other fatigue 08/27/2014  . Multiple joint pain 07/09/2014  . Medicare annual wellness visit, initial 08/25/2013  . Abdominal pain, other specified site 08/25/2013  . Multiple thyroid nodules 08/06/2013  . Bilateral carotid artery stenosis 05/22/2013  . Memory changes 08/27/2012  . Lipoma 05/05/2011  . Disordered eating 05/05/2011  . B12 deficiency 11/18/2010  . IRRITABLE BOWEL SYNDROME 11/11/2010  . DIVERTICULOSIS, COLON 11/04/2010  . OTHER CHRONIC NONALCOHOLIC LIVER DISEASE 60/63/0160  . COLONIC POLYPS, HX OF 11/04/2010  . Hyperglycemia 08/12/2010  . UTI (urinary tract infection) 09/11/2008  . MIGRAINE HEADACHE 12/27/2007  . Essential hypertension 12/27/2007  . MENOPAUSAL SYNDROME 12/27/2007  . Osteoporosis 11/05/2007  . LIVER FUNCTION TESTS, ABNORMAL, HX OF 05/06/2006   Pura Spice, PT, DPT # [phone] 04/09/2017, 9:22 AM  [place] [facility] [address] [place], [place], [address] Phone: [phone]   Fax:  [phone]  Name: AMISHA POSPISIL MRN: [id number] Date of Birth: 07-Jan-1946

## 2017-04-10 ENCOUNTER — Ambulatory Visit (INDEPENDENT_AMBULATORY_CARE_PROVIDER_SITE_OTHER): Payer: Self-pay | Admitting: Neurology

## 2017-04-10 ENCOUNTER — Encounter: Payer: Self-pay | Admitting: Neurology

## 2017-04-10 ENCOUNTER — Ambulatory Visit (INDEPENDENT_AMBULATORY_CARE_PROVIDER_SITE_OTHER): Payer: Medicare HMO | Admitting: Neurology

## 2017-04-10 ENCOUNTER — Encounter: Payer: Medicare HMO | Admitting: Neurology

## 2017-04-10 DIAGNOSIS — G5601 Carpal tunnel syndrome, right upper limb: Secondary | ICD-10-CM | POA: Diagnosis not present

## 2017-04-10 DIAGNOSIS — R202 Paresthesia of skin: Secondary | ICD-10-CM | POA: Diagnosis not present

## 2017-04-10 DIAGNOSIS — R200 Anesthesia of skin: Secondary | ICD-10-CM | POA: Diagnosis not present

## 2017-04-10 NOTE — Procedures (Signed)
HISTORY:  Susan [name] is a 71 year old patient with a history of borderline diabetes who reports numbness of all 4 extremities over the last year. The numbness is in the bottom of the feet and in the hands bilaterally. She is being evaluated for a possible neuropathy or a radiculopathy.  NERVE CONDUCTION STUDIES:  Nerve conduction studies were performed on both upper extremities. The distal motor latencies for the median nerves were prolonged on the right, normal on the left, with normal motor amplitudes for these nerves bilaterally. The distal motor latency for the ulnar nerves bilaterally were normal with normal motor amplitudes. Slowing was seen for the left ulnar nerve above the elbow only, normal above and below the elbow for the right ulnar nerve. The nerve conduction velocities for the median nerves were normal bilaterally. The sensory latencies for the right median nerve was prolonged, normal for the left median nerve, and normal for ulnar nerves bilaterally. The F wave latencies for the ulnar nerves were normal bilaterally.  Nerve conduction studies were performed on both lower extremities. The distal motor latencies and motor amplitudes for the peroneal and posterior tibial nerves were within normal limits. The nerve conduction velocities for these nerves were also normal. The F wave latencies for the posterior tibial nerves bilaterally were normal. The sensory latencies for the peroneal and sural nerves were within normal limits.   EMG STUDIES:  EMG study was performed on the left upper extremity:  The first dorsal interosseous muscle reveals 2 to 6 K units with decreased recruitment. No fibrillations or positive waves were noted. The abductor pollicis brevis muscle reveals 2 to 4 K units with full recruitment. No fibrillations or positive waves were noted. The extensor indicis proprius muscle reveals 1 to 3 K units with full recruitment. No fibrillations or positive waves  were noted. The pronator teres muscle reveals 2 to 3 K units with full recruitment. No fibrillations or positive waves were noted. The flexor digitorum profundus muscle (III-IV) reveals 2 to 3K units with full recruitment, no fibrillations or positive waves were noted. The biceps muscle reveals 1 to 2 K units with full recruitment. No fibrillations or positive waves were noted. The triceps muscle reveals 2 to 4 K units with full recruitment. No fibrillations or positive waves were noted. The anterior deltoid muscle reveals 2 to 3 K units with full recruitment. No fibrillations or positive waves were noted. The cervical paraspinal muscles were tested at 2 levels. No abnormalities of insertional activity were seen at either level tested. There was good relaxation.  EMG study was performed on the left lower extremity:  The tibialis anterior muscle reveals 2 to 4K motor units with full recruitment. No fibrillations or positive waves were seen. The peroneus tertius muscle reveals 2 to 4K motor units with full recruitment. No fibrillations or positive waves were seen. The medial gastrocnemius muscle reveals 1 to 3K motor units with full recruitment. No fibrillations or positive waves were seen. The vastus lateralis muscle reveals 2 to 4K motor units with full recruitment. No fibrillations or positive waves were seen. The iliopsoas muscle reveals 2 to 4K motor units with full recruitment. No fibrillations or positive waves were seen. The biceps femoris muscle (long head) reveals 2 to 4K motor units with full recruitment. No fibrillations or positive waves were seen. The lumbosacral paraspinal muscles were tested at 3 levels, and revealed no abnormalities of insertional activity at all 3 levels tested. There was good relaxation.   IMPRESSION:  Nerve conduction studies done on all 4 extremities shows evidence of a mild right carpal tunnel syndrome and evidence of a mild left ulnar neuropathy at the  cubital tunnel. Nerve conduction studies of the lower extremities were unremarkable, there is no evidence of an overlying peripheral neuropathy. EMG evaluation of the left lower extremity was unremarkable, without evidence of an overlying lumbosacral radiculopathy. EMG evaluation of the left upper extremity does show some mild chronic signs of neuropathic denervation distally consistent with an ulnar neuropathy. There is no evidence of an overlying cervical radiculopathy.  Jill Alexanders MD 04/10/2017 12:26 PM  [facility] [address] [place], [place] [address]  Phone [phone] Fax [phone]

## 2017-04-10 NOTE — Progress Notes (Signed)
Please refer to EMG and nerve conduction study procedure note. 

## 2017-04-16 ENCOUNTER — Encounter: Payer: Medicare HMO | Admitting: Physical Therapy

## 2017-04-17 ENCOUNTER — Telehealth: Payer: Self-pay | Admitting: Diagnostic Neuroimaging

## 2017-04-17 ENCOUNTER — Ambulatory Visit: Payer: Medicare HMO | Admitting: Physical Therapy

## 2017-04-17 ENCOUNTER — Encounter: Payer: Self-pay | Admitting: Physical Therapy

## 2017-04-17 DIAGNOSIS — M6281 Muscle weakness (generalized): Secondary | ICD-10-CM | POA: Diagnosis not present

## 2017-04-17 DIAGNOSIS — R269 Unspecified abnormalities of gait and mobility: Secondary | ICD-10-CM

## 2017-04-17 DIAGNOSIS — R202 Paresthesia of skin: Secondary | ICD-10-CM

## 2017-04-17 DIAGNOSIS — R200 Anesthesia of skin: Secondary | ICD-10-CM

## 2017-04-17 NOTE — Telephone Encounter (Signed)
Pt request NCV/EMG results. Please call

## 2017-04-17 NOTE — Therapy (Signed)
[facility] [address] [place], [place], [address] Phone: [phone]   Fax:  [phone]  Physical Therapy Treatment  Patient Details  Name: Susan [name] MRN: [id number] Date of Birth: April 21, 1946 Referring Provider: Dr. Leta Baptist  Encounter Date: 04/17/2017      PT End of Session - 04/17/17 1116    Visit Number 4   Number of Visits 5   Date for PT Re-Evaluation 04/23/17   Authorization - Visit Number 4   Authorization - Number of Visits 10   PT Start Time 1114   PT Stop Time 1157   PT Time Calculation (min) 43 min   Activity Tolerance Patient tolerated treatment well   Behavior During Therapy [facility] for tasks assessed/performed      Past Medical History:  Diagnosis Date  . Colon cancer screening   . Diverticulosis of colon   . IBS (irritable bowel syndrome)   . 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 Migraine, unspecified, without mention of intractable migraine without mention of status migrainosus   . NASH (nonalcoholic steatohepatitis)   . Ocular migraine   . Osteoporosis    on Fosamax for 5 years  . Other abnormal glucose   . Other chronic nonalcoholic liver disease    LFTs normalized with weight loss 2013  . Other screening mammogram   . Symptomatic menopausal or female climacteric states   . Tubular adenoma of colon   . Urinary tract infection, site not specified     Past Surgical History:  Procedure Laterality Date  . abdominal ultrasound  04/12/2004 & 6/06   positive gallstones  . ANKLE FRACTURE SURGERY  ~2002   pinning  . BTL  30+ years ago  . CATARACT EXTRACTION  2013   B eyes  . COLONOSCOPY  06/08/06   Polyps, divertics (Dr. Sharlett Iles)  . CT abdomen  04/29/04   Positive gallstones  . TONSILLECTOMY AND ADENOIDECTOMY  Age 71  . ultrasound pelvis  6/06   Negative    There were no vitals filed for this visit.      Subjective Assessment - 04/17/17 1114    Subjective Pt reports she had her NCV test  done but has not yet heard from the MD office with the results.  No new complaints or concerns.  She has been completing her HEP without questions or concerns.     Pertinent History Pt. watches 96 month old grandkid 2 days/week (Wed. and Thursday).     Limitations Lifting;Walking;House hold activities   How long can you walk comfortably? pt. states she can't squat due to ankle pain/ arthritis   Diagnostic tests Pt. has NCV scheduled for 04/10/17   Patient Stated Goals Decrease numbness/tingling in hand and feet.  Improve balance/ decrease fall risk.     Currently in Pain? No/denies      TREATMENT  Therapeutic Exercise:  Nustep L6 B UE/LE 5 min. (warm-up/no charge)- consistent cadence/ no increase symptoms. ?  Supine SLR 2x10 each LE  Supine hip marching with cues for glute control and activation 2x10 each LE  Supine bicycles 2x20 each LE with cues for core activation  Total gym level 22 with cues for core activation and eccentric control BLEs. 2x15?  Lateral walking in //-bars (correction of R foot position to midline) 10x lengths in // bars. ?  Ascend/descending stairs with step to pattern with use of SPC. Noted decreased control eccentrically when stepping down with LLE.  RLE step downs with cues to  slow down descent for greater quad recruitment and control. 4 steps x3.?   Neuromuscular Re-ed:  Airex standing with cone reaching x3 minutes  Alternating step touches up to Bosu ball with round side up in //-bars with intermittent support from // bars. ?  Tandem gait in //-bars with pt walking on blue line with light UE assist (x4 lengths forward and backwards).  Mini squats on Bosu ball with flat side up x10  Marching on airex with no UE support          PT Education - 04/17/17 1116    Education provided Yes   Education Details Exercise technique; importance of completing strengthening exercises to fatigue   Person(s) Educated Patient   Methods Demonstration;Explanation;Verbal cues    Comprehension Verbalized understanding;Returned demonstration;Verbal cues required;Need further instruction             PT Long Term Goals - 03/27/17 1740      PT LONG TERM GOAL #1   Title Pt. I with HEP to increase B UE/LE muscle strength grossly 5/5 MMT to improve functional mobility.     Baseline B UE/LE strength grossly 4+/5 MMT.     Time 4   Period Weeks   Status New     PT LONG TERM GOAL #2   Title Pt. will increase LEFS to >60 out of 80 to improve pain-free mobility.     Baseline LEFS: 53 out of 80 on 5/21   Time 4   Period Weeks   Status New     PT LONG TERM GOAL #3   Title Pt. will increase Berg balance test to >50/56 to improve balance/ decrease fall risk   Baseline Berg: 46/56 on 5/21 (recommend use of SPC).     Time 4   Period Weeks   Status New     PT LONG TERM GOAL #4   Title Pt. will report 50% improvement in B hand/feet numbness since start of PT to improve grasp/ decrease dropping objects.     Baseline pt. c/o constant numbness/tingline in B hands and feet.    Time 4   Period Weeks   Status New               Plan - 04/17/17 1201    Clinical Impression Statement Pt demonstrated poor eccentric control with RLE and thus step down strengthening and eccentric control introduced and pt performed with improved control with cues.  Pt demonstrated fatigue and instability with balance exercises but was very motivated and determined to improve this during session.  She will benefit from continued skilled PT interventions for improved strength, balance, and to decrease her fall risk.   Rehab Potential Fair   PT Frequency 1x / week   PT Duration 4 weeks   PT Treatment/Interventions ADLs/Self Care Home Management;Cryotherapy;Electrical Stimulation;Moist Heat;Balance training;Therapeutic exercise;Therapeutic activities;Functional mobility training;Stair training;Gait training;Neuromuscular re-education;Patient/family education;Passive range of motion;Manual  techniques   PT Next Visit Plan Discuss NCV test results (6/5).   Progress balance program/ safety with SPC.     PT Home Exercise Plan putty ex.     Consulted and Agree with Plan of Care Patient      Patient will benefit from skilled therapeutic intervention in order to improve the following deficits and impairments:  Abnormal gait, Impaired sensation, Pain, Decreased mobility, Postural dysfunction, Hypomobility, Decreased strength, Decreased range of motion, Decreased endurance, Decreased activity tolerance, Decreased balance, Difficulty walking  Visit Diagnosis: Gait difficulty  Muscle weakness (generalized)  Numbness and tingling  in both hands  Numbness and tingling of both feet     Problem List Patient Active Problem List   Diagnosis Date Noted  . Hand numbness 04/10/2017  . Numbness and tingling of foot 04/10/2017  . Carpal tunnel syndrome of right wrist 04/10/2017  . Neuropathy 02/27/2017  . HLD (hyperlipidemia) 07/23/2016  . Healthcare maintenance 07/23/2016  . Cystocele 05/19/2015  . Dupuytren contracture 03/24/2015  . Advance care planning 03/24/2015  . Benign paroxysmal positional vertigo 11/09/2014  . Anxiety state 11/09/2014  . Other fatigue 08/27/2014  . Multiple joint pain 07/09/2014  . Medicare annual wellness visit, initial 08/25/2013  . Abdominal pain, other specified site 08/25/2013  . Multiple thyroid nodules 08/06/2013  . Bilateral carotid artery stenosis 05/22/2013  . Memory changes 08/27/2012  . Lipoma 05/05/2011  . Disordered eating 05/05/2011  . B12 deficiency 11/18/2010  . IRRITABLE BOWEL SYNDROME 11/11/2010  . DIVERTICULOSIS, COLON 11/04/2010  . OTHER CHRONIC NONALCOHOLIC LIVER DISEASE 99/04/8933  . COLONIC POLYPS, HX OF 11/04/2010  . Hyperglycemia 08/12/2010  . UTI (urinary tract infection) 09/11/2008  . MIGRAINE HEADACHE 12/27/2007  . Essential hypertension 12/27/2007  . MENOPAUSAL SYNDROME 12/27/2007  . Osteoporosis 11/05/2007  .  LIVER FUNCTION TESTS, ABNORMAL, HX OF 05/06/2006    Collie Siad PT, DPT 04/17/2017, 12:04 PM  [facility] [address] [place], [place], [address] Phone: [phone]   Fax:  [phone]  Name: Susan [name] MRN: [id number] Date of Birth: Apr 16, 1946

## 2017-04-18 NOTE — Telephone Encounter (Signed)
Spoke with patient and informed her that the EMG/NCS showed right carpal tunnel syndrome, and her left ulnar nerve "pinched" at the elbow. Advised her the next steps could include referral to hand surgery for treatment options. Otherwise she should wear a wrist splint on the right hand and avoid pressure / compression at her left elbow. Advised her there is no nerve damage in the legs or low back. Patient stated she does not want surgery if possible. She  inquired about when to wear splint. This RN advised whenever she has noticed the pain, or whatever activities have caused her pain in her right wrist. Advised patient that if she did decide to see a hand surgeon, her PCP could also refer her.  She asked about her leg numbness, advised she complete PT to see if this issue improves. She verbalized understanding, appreciation of call.

## 2017-04-23 ENCOUNTER — Ambulatory Visit: Payer: Medicare HMO | Admitting: Physical Therapy

## 2017-04-23 ENCOUNTER — Encounter: Payer: Self-pay | Admitting: Physical Therapy

## 2017-04-23 DIAGNOSIS — R200 Anesthesia of skin: Secondary | ICD-10-CM

## 2017-04-23 DIAGNOSIS — M6281 Muscle weakness (generalized): Secondary | ICD-10-CM | POA: Diagnosis not present

## 2017-04-23 DIAGNOSIS — R202 Paresthesia of skin: Secondary | ICD-10-CM | POA: Diagnosis not present

## 2017-04-23 DIAGNOSIS — R269 Unspecified abnormalities of gait and mobility: Secondary | ICD-10-CM

## 2017-04-23 NOTE — Therapy (Signed)
[facility] [place] [facility] [address] [place], [place], [address] Phone: [phone]   Fax:  [phone]  Physical Therapy Treatment  Patient Details  Name: Susan [name] MRN: [id number] Date of Birth: 16-Aug-1946 Referring Provider: Dr. Leta Baptist  Encounter Date: 04/23/2017      PT End of Session - 04/23/17 0818    Visit Number 5   Number of Visits 9   Date for PT Re-Evaluation 05/21/17   Authorization - Visit Number 1   Authorization - Number of Visits 10   PT Start Time 0804   PT Stop Time 0900   PT Time Calculation (min) 56 min   Activity Tolerance Patient tolerated treatment well   Behavior During Therapy [facility] for tasks assessed/performed      Past Medical History:  Diagnosis Date  . Colon cancer screening   . Diverticulosis of colon   . IBS (irritable bowel syndrome)   . 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 Migraine, unspecified, without mention of intractable migraine without mention of status migrainosus   . NASH (nonalcoholic steatohepatitis)   . Ocular migraine   . Osteoporosis    on Fosamax for 5 years  . Other abnormal glucose   . Other chronic nonalcoholic liver disease    LFTs normalized with weight loss 2013  . Other screening mammogram   . Symptomatic menopausal or female climacteric states   . Tubular adenoma of colon   . Urinary tract infection, site not specified     Past Surgical History:  Procedure Laterality Date  . abdominal ultrasound  04/12/2004 & 6/06   positive gallstones  . ANKLE FRACTURE SURGERY  ~2002   pinning  . BTL  30+ years ago  . CATARACT EXTRACTION  2013   B eyes  . COLONOSCOPY  06/08/06   Polyps, divertics (Dr. Sharlett Iles)  . CT abdomen  04/29/04   Positive gallstones  . TONSILLECTOMY AND ADENOIDECTOMY  Age 71  . ultrasound pelvis  6/06   Negative    There were no vitals filed for this visit.      Subjective Assessment - 04/23/17 0808    Subjective Pt reports she believes her  balance has been improving since starting therapy.  Her fingers are hurting when she bends them but she attributes this to her arthritis.  Pt reports she is sore from exercising.  Pt reports she is now wearing a wrist splint on her R wrist as instructed by her MD following the NCV, she only wears it for a few hours at a time each day.  She reports that her R fingers tingle more when the splint is on but thinks it is helping with reports of her hand not bothering her as much.  Instructed pt to bring in her wrist splint at next session to inspect size and fit.   Pertinent History Pt. watches 39 month old grandkid 2 days/week (Wed. and Thursday).     Limitations Lifting;Walking;House hold activities   How long can you walk comfortably? pt. states she can't squat due to ankle pain/ arthritis   Diagnostic tests NCV (see chart)   Patient Stated Goals Decrease numbness/tingling in hand and feet.  Improve balance/ decrease fall risk.     Currently in Pain? Yes   Pain Score 6    Pain Location Finger (Comment which one)  Bil fingers   Pain Orientation Left   Pain Descriptors / Indicators Discomfort   Pain Type Chronic pain   Pain Onset --  only when she bends them   Multiple Pain Sites No      BUE/LE strength: BUE strength grossly 4+/5; R hip flexion, R DF 4+/5 otherwise 5/5 RLE; L hip flexion 4+/5, L knee extension 4+/5 otherwise 5/5 LLE.   LEFS: 48/80   Berg Balance Test: 54/56   % improvement in B hand/feet numbness: 50% improvement in Bil feet numbness, 10% improvement in hand numbness      Therapeutic Exercise:  Lateral walking in //-bars with RTB around ankles (correction of R foot position to midline) 10x lengths in // bars. Cues for slight knee bend for increased glute recruitment.   RLE step downs with cues to slow down descent for greater quad recruitment and control. 4 steps x5.?   Seated Bil hip Abd/ER with BTB around knees 2x15   Total gym level 22 with cues for core activation  and eccentric control BLEs. On the second set pt instructed to push through her heels for greater glute activation. 2x15   Bridges with cues for glute and core activation x10 with 5 second holds   Mini squats on BOSU ball with flat side up x10 and min guard for safety   Marching in sitting with BTB x20          [facility] Adult PT Treatment/Exercise - 04/23/17 0824      Standardized Balance Assessment   Standardized Balance Assessment Berg Balance Test     Berg Balance Test   Sit to Stand Able to stand without using hands and stabilize independently   Standing Unsupported Able to stand safely 2 minutes   Sitting with Back Unsupported but Feet Supported on Floor or Stool Able to sit safely and securely 2 minutes   Stand to Sit Sits safely with minimal use of hands   Transfers Able to transfer safely, minor use of hands   Standing Unsupported with Eyes Closed Able to stand 10 seconds safely   Standing Ubsupported with Feet Together Able to place feet together independently and stand 1 minute safely   From Standing, Reach Forward with Outstretched Arm Can reach confidently >25 cm (10")   From Standing Position, Pick up Object from Floor Able to pick up shoe safely and easily   From Standing Position, Turn to Look Behind Over each Shoulder Looks behind from both sides and weight shifts well   Turn 360 Degrees Able to turn 360 degrees safely in 4 seconds or less   Standing Unsupported, Alternately Place Feet on Step/Stool Able to stand independently and safely and complete 8 steps in 20 seconds   Standing Unsupported, One Foot in Front Able to plae foot ahead of the other independently and hold 30 seconds   Standing on One Leg Able to lift leg independently and hold 5-10 seconds   Total Score 54                PT Education - 04/23/17 0807    Education provided Yes   Education Details Exercise technique; results of outcome measures; POC moving forward   Person(s) Educated Patient    Methods Explanation;Demonstration;Verbal cues   Comprehension Verbalized understanding;Returned demonstration;Verbal cues required;Need further instruction             PT Long Term Goals - 04/23/17 0834      PT LONG TERM GOAL #1   Title Pt. I with HEP to increase B UE/LE muscle strength grossly 5/5 MMT to improve functional mobility.     Baseline B UE/LE strength grossly  4+/5 MMT; 2017/05/04:  BUE strength grossly 4+/5; R hip flexion, R DF 4+/5 otherwise 5/5 RLE; L hip flexion 4+/5, L knee extension 4+/5 otherwise 5/5 LLE.   Time 4   Period Weeks   Status Partially Met     PT LONG TERM GOAL #2   Title Pt. will increase LEFS to >60 out of 80 to improve pain-free mobility.     Baseline LEFS: 53 out of 80 on 5/21; 05-04-17: 48/80   Time 4   Period Weeks   Status On-going     PT LONG TERM GOAL #3   Title Pt. will increase Berg balance test to >50/56 to improve balance/ decrease fall risk   Baseline Berg: 46/56 on 5/21 (recommend use of SPC); 05-04-2017: 54/56   Time 4   Period Weeks   Status Achieved     PT LONG TERM GOAL #4   Title Pt. will report 50% improvement in B hand/feet numbness since start of PT to improve grasp/ decrease dropping objects.     Baseline pt. c/o constant numbness/tingline in B hands and feet; 05-04-2017: 50% improvement in Bil feet numbness, 10% improvement in hand numbness   Time 4   Period Weeks   Status Partially Met               Plan - 05/04/2017 0837    Clinical Impression Statement Pt's Berg Balance score improved significantly, indicating pt is at a decreased risk of falling and that pt presents with improved balance since beginning therapy.  She did not make an improvement on her LEFS score and will benefit from additional strengthening to allow for improved functional use of BLEs.  Pt reports an improvement in both feet and hand numbness.  Pt will bring in her R wrist splint at next session to be inspected for size and fit.  Pt will benefit from  continued skilled PT interventions for improved BLE strength, gait mechanics, and decreased numbness symptoms Bil feet and hands.   Rehab Potential Fair   PT Frequency 1x / week   PT Duration 4 weeks   PT Treatment/Interventions ADLs/Self Care Home Management;Cryotherapy;Electrical Stimulation;Moist Heat;Balance training;Therapeutic exercise;Therapeutic activities;Functional mobility training;Stair training;Gait training;Neuromuscular re-education;Patient/family education;Passive range of motion;Manual techniques   PT Next Visit Plan Check fit and size of R wrist splint.  Progress strengthening program.   PT Home Exercise Plan putty ex.     Consulted and Agree with Plan of Care Patient      Patient will benefit from skilled therapeutic intervention in order to improve the following deficits and impairments:  Abnormal gait, Impaired sensation, Pain, Decreased mobility, Postural dysfunction, Hypomobility, Decreased strength, Decreased range of motion, Decreased endurance, Decreased activity tolerance, Decreased balance, Difficulty walking  Visit Diagnosis: Gait difficulty  Muscle weakness (generalized)  Numbness and tingling in both hands  Numbness and tingling of both feet       G-Codes - 05-04-2017 0955    Functional Assessment Tool Used (Outpatient Only) Berg Balance Test, LEFS, functional mobility, clinical judgement, MMT, numbness and tingling   Functional Limitation Mobility: Walking and moving around   Mobility: Walking and Moving Around Current Status [phone]) At least 20 percent but less than 40 percent impaired, limited or restricted   Mobility: Walking and Moving Around Goal Status [phone]) At least 1 percent but less than 20 percent impaired, limited or restricted      Problem List Patient Active Problem List   Diagnosis Date Noted  . Hand numbness 04/10/2017  . Numbness  and tingling of foot 04/10/2017  . Carpal tunnel syndrome of right wrist 04/10/2017  . Neuropathy  02/27/2017  . HLD (hyperlipidemia) 07/23/2016  . Healthcare maintenance 07/23/2016  . Cystocele 05/19/2015  . Dupuytren contracture 03/24/2015  . Advance care planning 03/24/2015  . Benign paroxysmal positional vertigo 11/09/2014  . Anxiety state 11/09/2014  . Other fatigue 08/27/2014  . Multiple joint pain 07/09/2014  . Medicare annual wellness visit, initial 08/25/2013  . Abdominal pain, other specified site 08/25/2013  . Multiple thyroid nodules 08/06/2013  . Bilateral carotid artery stenosis 05/22/2013  . Memory changes 08/27/2012  . Lipoma 05/05/2011  . Disordered eating 05/05/2011  . B12 deficiency 11/18/2010  . IRRITABLE BOWEL SYNDROME 11/11/2010  . DIVERTICULOSIS, COLON 11/04/2010  . OTHER CHRONIC NONALCOHOLIC LIVER DISEASE 84/53/6468  . COLONIC POLYPS, HX OF 11/04/2010  . Hyperglycemia 08/12/2010  . UTI (urinary tract infection) 09/11/2008  . MIGRAINE HEADACHE 12/27/2007  . Essential hypertension 12/27/2007  . MENOPAUSAL SYNDROME 12/27/2007  . Osteoporosis 11/05/2007  . LIVER FUNCTION TESTS, ABNORMAL, HX OF 05/06/2006    Collie Siad PT, DPT 04/23/2017, 10:05 AM  [facility] [address] [place], [place], [address] Phone: [phone]   Fax:  [phone]  Name: Susan [name] MRN: [id number] Date of Birth: 01/17/1946

## 2017-04-30 ENCOUNTER — Ambulatory Visit: Payer: Medicare HMO | Admitting: Physical Therapy

## 2017-04-30 ENCOUNTER — Encounter: Payer: Self-pay | Admitting: Physical Therapy

## 2017-04-30 DIAGNOSIS — M6281 Muscle weakness (generalized): Secondary | ICD-10-CM

## 2017-04-30 DIAGNOSIS — R202 Paresthesia of skin: Secondary | ICD-10-CM

## 2017-04-30 DIAGNOSIS — R200 Anesthesia of skin: Secondary | ICD-10-CM

## 2017-04-30 DIAGNOSIS — R269 Unspecified abnormalities of gait and mobility: Secondary | ICD-10-CM | POA: Diagnosis not present

## 2017-04-30 NOTE — Therapy (Signed)
[place] [facility] [address]. [place], [place], [address] Phone: [phone]   Fax:  [phone]  Physical Therapy Treatment  Patient Details  Name: Susan [name] MRN: [id number] Date of Birth: June 15, 1946 Referring Provider: Dr. Leta Baptist  Encounter Date: 04/30/2017      PT End of Session - 05/01/17 0740    Visit Number 6   Number of Visits 9   Date for PT Re-Evaluation 05/21/17   Authorization - Visit Number 2   Authorization - Number of Visits 10   PT Start Time 0811   PT Stop Time 0905   PT Time Calculation (min) 54 min   Activity Tolerance Patient tolerated treatment well   Behavior During Therapy [facility] for tasks assessed/performed      Past Medical History:  Diagnosis Date  . Colon cancer screening   . Diverticulosis of colon   . IBS (irritable bowel syndrome)   . 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 Migraine, unspecified, without mention of intractable migraine without mention of status migrainosus   . NASH (nonalcoholic steatohepatitis)   . Ocular migraine   . Osteoporosis    on Fosamax for 5 years  . Other abnormal glucose   . Other chronic nonalcoholic liver disease    LFTs normalized with weight loss 2013  . Other screening mammogram   . Symptomatic menopausal or female climacteric states   . Tubular adenoma of colon   . Urinary tract infection, site not specified     Past Surgical History:  Procedure Laterality Date  . abdominal ultrasound  04/12/2004 & 6/06   positive gallstones  . ANKLE FRACTURE SURGERY  ~2002   pinning  . BTL  30+ years ago  . CATARACT EXTRACTION  2013   B eyes  . COLONOSCOPY  06/08/06   Polyps, divertics (Dr. Sharlett Iles)  . CT abdomen  04/29/04   Positive gallstones  . TONSILLECTOMY AND ADENOIDECTOMY  Age 43  . ultrasound pelvis  6/06   Negative    There were no vitals filed for this visit.      Subjective Assessment - 04/30/17 0818    Subjective Pt. still has numbness in hand  with and without use of wrist splint.  PT checked splint and it properly fits but minimal benefit reported.  PT reviewed NCV results with pt.     Pertinent History Pt. watches 59 month old grandkid 2 days/week (Wed. and Thursday).     Limitations Lifting;Walking;House hold activities   How long can you walk comfortably? pt. states she can't squat due to ankle pain/ arthritis   Diagnostic tests NCV (see chart)   Patient Stated Goals Decrease numbness/tingling in hand and feet.  Improve balance/ decrease fall risk.     Currently in Pain? No/denies        Therapeutic Exercise:   Nustep L7 10 min. B UE/LE (warm-up/no charge).    Walking in hallway/ tandem gait f/b and lateral walking.    Mini squats/ wt. shifting on BOSU ball with flat side up x10 and min guard for safety   SLS on R (difficulty secondary to medial ankle pain).    Resisted walking with 1BTB (10x all 4-planes)- min. To no UE assist.   TG with cues for core activation and eccentric control BLEs. 2x15   Cone taps in //-bars with cuing for posture/core muscle activitation.  Marching in sitting with manual resistance 10x2 each.  Discussed HEP/ results of NCV report.  PT Long Term Goals - 04/23/17 0834      PT LONG TERM GOAL #1   Title Pt. I with HEP to increase B UE/LE muscle strength grossly 5/5 MMT to improve functional mobility.     Baseline B UE/LE strength grossly 4+/5 MMT; 04/23/17:  BUE strength grossly 4+/5; R hip flexion, R DF 4+/5 otherwise 5/5 RLE; L hip flexion 4+/5, L knee extension 4+/5 otherwise 5/5 LLE.   Time 4   Period Weeks   Status Partially Met     PT LONG TERM GOAL #2   Title Pt. will increase LEFS to >60 out of 80 to improve pain-free mobility.     Baseline LEFS: 53 out of 80 on 5/21; 04/23/17: 48/80   Time 4   Period Weeks   Status On-going     PT LONG TERM GOAL #3   Title Pt. will increase Berg balance test to >50/56 to improve balance/ decrease fall risk   Baseline Berg:  46/56 on 5/21 (recommend use of SPC); 04/23/17: 54/56   Time 4   Period Weeks   Status Achieved     PT LONG TERM GOAL #4   Title Pt. will report 50% improvement in B hand/feet numbness since start of PT to improve grasp/ decrease dropping objects.     Baseline pt. c/o constant numbness/tingline in B hands and feet; 04/23/17: 50% improvement in Bil feet numbness, 10% improvement in hand numbness   Time 4   Period Weeks   Status Partially Met               Plan - 04/30/17 0914    Clinical Impression Statement Pt recommends not using SPC while walking around house and occasionally use SPC if walking on uneven terrain outside.  Pt. progressing well with LE strengthening ex. program which as improved overall balance tasks.     Clinical Presentation Stable   Clinical Decision Making Moderate   Rehab Potential Fair   PT Frequency 1x / week   PT Duration 4 weeks   PT Treatment/Interventions ADLs/Self Care Home Management;Cryotherapy;Electrical Stimulation;Moist Heat;Balance training;Therapeutic exercise;Therapeutic activities;Functional mobility training;Stair training;Gait training;Neuromuscular re-education;Patient/family education;Passive range of motion;Manual techniques   PT Next Visit Plan Progress strengthening program.   PT Home Exercise Plan putty ex.     Consulted and Agree with Plan of Care Patient      Patient will benefit from skilled therapeutic intervention in order to improve the following deficits and impairments:  Abnormal gait, Impaired sensation, Pain, Decreased mobility, Postural dysfunction, Hypomobility, Decreased strength, Decreased range of motion, Decreased endurance, Decreased activity tolerance, Decreased balance, Difficulty walking  Visit Diagnosis: Gait difficulty  Muscle weakness (generalized)  Numbness and tingling in both hands  Numbness and tingling of both feet     Problem List Patient Active Problem List   Diagnosis Date Noted  . Hand  numbness 04/10/2017  . Numbness and tingling of foot 04/10/2017  . Carpal tunnel syndrome of right wrist 04/10/2017  . Neuropathy 02/27/2017  . HLD (hyperlipidemia) 07/23/2016  . Healthcare maintenance 07/23/2016  . Cystocele 05/19/2015  . Dupuytren contracture 03/24/2015  . Advance care planning 03/24/2015  . Benign paroxysmal positional vertigo 11/09/2014  . Anxiety state 11/09/2014  . Other fatigue 08/27/2014  . Multiple joint pain 07/09/2014  . Medicare annual wellness visit, initial 08/25/2013  . Abdominal pain, other specified site 08/25/2013  . Multiple thyroid nodules 08/06/2013  . Bilateral carotid artery stenosis 05/22/2013  . Memory changes 08/27/2012  . Lipoma 05/05/2011  .  Disordered eating 05/05/2011  . B12 deficiency 11/18/2010  . IRRITABLE BOWEL SYNDROME 11/11/2010  . DIVERTICULOSIS, COLON 11/04/2010  . OTHER CHRONIC NONALCOHOLIC LIVER DISEASE 17/10/7870  . COLONIC POLYPS, HX OF 11/04/2010  . Hyperglycemia 08/12/2010  . UTI (urinary tract infection) 09/11/2008  . MIGRAINE HEADACHE 12/27/2007  . Essential hypertension 12/27/2007  . MENOPAUSAL SYNDROME 12/27/2007  . Osteoporosis 11/05/2007  . LIVER FUNCTION TESTS, ABNORMAL, HX OF 05/06/2006   Pura Spice, PT, DPT # [phone] 05/01/2017, 7:43 AM   [facility] [address] [place], [place], [address] Phone: [phone]   Fax:  [phone]  Name: LAKEITA PANTHER MRN: [id number] Date of Birth: 1945/11/08

## 2017-05-07 ENCOUNTER — Ambulatory Visit: Payer: Medicare HMO | Attending: Diagnostic Neuroimaging | Admitting: Physical Therapy

## 2017-05-07 ENCOUNTER — Encounter: Payer: Self-pay | Admitting: Physical Therapy

## 2017-05-07 DIAGNOSIS — R269 Unspecified abnormalities of gait and mobility: Secondary | ICD-10-CM | POA: Diagnosis not present

## 2017-05-07 DIAGNOSIS — M6281 Muscle weakness (generalized): Secondary | ICD-10-CM | POA: Diagnosis not present

## 2017-05-07 DIAGNOSIS — R200 Anesthesia of skin: Secondary | ICD-10-CM | POA: Diagnosis not present

## 2017-05-07 DIAGNOSIS — R202 Paresthesia of skin: Secondary | ICD-10-CM | POA: Diagnosis not present

## 2017-05-07 NOTE — Therapy (Signed)
[place] [facility] [address] [place], [place], [address] Phone: [phone]   Fax:  [phone]  Physical Therapy Treatment  Patient Details  Name: Susan [name] MRN: [id number] Date of Birth: Mar 31, 1946 Referring Provider: Dr. Leta Baptist  Encounter Date: 05/07/2017      PT End of Session - 05/07/17 0811    Visit Number 7   Number of Visits 9   Date for PT Re-Evaluation 05/21/17   Authorization - Visit Number 3   Authorization - Number of Visits 10   PT Start Time 0806   PT Stop Time 0855   PT Time Calculation (min) 49 min   Activity Tolerance Patient tolerated treatment well   Behavior During Therapy [facility] for tasks assessed/performed      Past Medical History:  Diagnosis Date  . Colon cancer screening   . Diverticulosis of colon   . IBS (irritable bowel syndrome)   . 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 Migraine, unspecified, without mention of intractable migraine without mention of status migrainosus   . NASH (nonalcoholic steatohepatitis)   . Ocular migraine   . Osteoporosis    on Fosamax for 5 years  . Other abnormal glucose   . Other chronic nonalcoholic liver disease    LFTs normalized with weight loss 2013  . Other screening mammogram   . Symptomatic menopausal or female climacteric states   . Tubular adenoma of colon   . Urinary tract infection, site not specified     Past Surgical History:  Procedure Laterality Date  . abdominal ultrasound  04/12/2004 & 6/06   positive gallstones  . ANKLE FRACTURE SURGERY  ~2002   pinning  . BTL  30+ years ago  . CATARACT EXTRACTION  2013   B eyes  . COLONOSCOPY  06/08/06   Polyps, divertics (Dr. Sharlett Iles)  . CT abdomen  04/29/04   Positive gallstones  . TONSILLECTOMY AND ADENOIDECTOMY  Age 39  . ultrasound pelvis  6/06   Negative    There were no vitals filed for this visit.      Subjective Assessment - 05/07/17 0808    Subjective No change in numbness symptoms.  No  falls.  Pt. watched 1 month old grandchild this weekend.     Pertinent History Pt. watches 24 month old grandkid 2 days/week (Wed. and Thursday).     Limitations Lifting;Walking;House hold activities   How long can you walk comfortably? pt. states she can't squat due to ankle pain/ arthritis   Diagnostic tests NCV (see chart)   Patient Stated Goals Decrease numbness/tingling in hand and feet.  Improve balance/ decrease fall risk.     Currently in Pain? No/denies      Therapeutic Exercise:   Nustep L7 10 min. B UE/LE. (warm-up/no charge).  Box lifting 10#-30# from knee height to shelf/ B carrying in clinic (5-10#).    Stair climbing (recip. Pattern with min. To no UE assist).    Resisted gait with 1BTB 5x all 4-planes.    Attempted toe walking in //-bars (difficulty on R).  Total gym B knee flexion20x2/  Heel raises 20x.    Airex (marching/ tandem stance/ nuts and bolt assembly)- manual dexerity.  Reviewed HEP       PT Long Term Goals - 04/23/17 0834      PT LONG TERM GOAL #1   Title Pt. I with HEP to increase B UE/LE muscle strength grossly 5/5 MMT to improve functional mobility.  Baseline B UE/LE strength grossly 4+/5 MMT; 04/23/17:  BUE strength grossly 4+/5; R hip flexion, R DF 4+/5 otherwise 5/5 RLE; L hip flexion 4+/5, L knee extension 4+/5 otherwise 5/5 LLE.   Time 4   Period Weeks   Status Partially Met     PT LONG TERM GOAL #2   Title Pt. will increase LEFS to >60 out of 80 to improve pain-free mobility.     Baseline LEFS: 53 out of 80 on 5/21; 04/23/17: 48/80   Time 4   Period Weeks   Status On-going     PT LONG TERM GOAL #3   Title Pt. will increase Berg balance test to >50/56 to improve balance/ decrease fall risk   Baseline Berg: 46/56 on 5/21 (recommend use of SPC); 04/23/17: 54/56   Time 4   Period Weeks   Status Achieved     PT LONG TERM GOAL #4   Title Pt. will report 50% improvement in B hand/feet numbness since start of PT to improve grasp/  decrease dropping objects.     Baseline pt. c/o constant numbness/tingline in B hands and feet; 04/23/17: 50% improvement in Bil feet numbness, 10% improvement in hand numbness   Time 4   Period Weeks   Status Partially Met               Plan - 05/07/17 0811    Clinical Impression Statement Decrease stance phase on L LE during gait pattern in clinic.  PT encouraged pt. to increase R LE swingthrough phase of gait during recip. pattern with more consistent BOS/ heel strike to improve balance.     Clinical Presentation Stable   Clinical Decision Making Moderate   Rehab Potential Fair   PT Frequency 1x / week   PT Duration 4 weeks   PT Treatment/Interventions ADLs/Self Care Home Management;Cryotherapy;Electrical Stimulation;Moist Heat;Balance training;Therapeutic exercise;Therapeutic activities;Functional mobility training;Stair training;Gait training;Neuromuscular re-education;Patient/family education;Passive range of motion;Manual techniques   PT Next Visit Plan Progress strengthening program.   PT Home Exercise Plan putty ex.     Consulted and Agree with Plan of Care Patient      Patient will benefit from skilled therapeutic intervention in order to improve the following deficits and impairments:  Abnormal gait, Impaired sensation, Pain, Decreased mobility, Postural dysfunction, Hypomobility, Decreased strength, Decreased range of motion, Decreased endurance, Decreased activity tolerance, Decreased balance, Difficulty walking  Visit Diagnosis: Gait difficulty  Muscle weakness (generalized)  Numbness and tingling in both hands  Numbness and tingling of both feet     Problem List Patient Active Problem List   Diagnosis Date Noted  . Hand numbness 04/10/2017  . Numbness and tingling of foot 04/10/2017  . Carpal tunnel syndrome of right wrist 04/10/2017  . Neuropathy 02/27/2017  . HLD (hyperlipidemia) 07/23/2016  . Healthcare maintenance 07/23/2016  . Cystocele  05/19/2015  . Dupuytren contracture 03/24/2015  . Advance care planning 03/24/2015  . Benign paroxysmal positional vertigo 11/09/2014  . Anxiety state 11/09/2014  . Other fatigue 08/27/2014  . Multiple joint pain 07/09/2014  . Medicare annual wellness visit, initial 08/25/2013  . Abdominal pain, other specified site 08/25/2013  . Multiple thyroid nodules 08/06/2013  . Bilateral carotid artery stenosis 05/22/2013  . Memory changes 08/27/2012  . Lipoma 05/05/2011  . Disordered eating 05/05/2011  . B12 deficiency 11/18/2010  . IRRITABLE BOWEL SYNDROME 11/11/2010  . DIVERTICULOSIS, COLON 11/04/2010  . OTHER CHRONIC NONALCOHOLIC LIVER DISEASE 53/74/8270  . COLONIC POLYPS, HX OF 11/04/2010  . Hyperglycemia 08/12/2010  .  UTI (urinary tract infection) 09/11/2008  . MIGRAINE HEADACHE 12/27/2007  . Essential hypertension 12/27/2007  . MENOPAUSAL SYNDROME 12/27/2007  . Osteoporosis 11/05/2007  . LIVER FUNCTION TESTS, ABNORMAL, HX OF 05/06/2006   Pura Spice, PT, DPT # [phone] 05/08/2017, 2:32 PM  [place] [facility] [address] [place], [place], [address] Phone: [phone]   Fax:  [phone]  Name: KELIE GAINEY MRN: [id number] Date of Birth: 1946/07/22

## 2017-05-14 ENCOUNTER — Ambulatory Visit: Payer: Medicare HMO | Admitting: Physical Therapy

## 2017-05-14 ENCOUNTER — Encounter: Payer: Self-pay | Admitting: Physical Therapy

## 2017-05-14 DIAGNOSIS — M6281 Muscle weakness (generalized): Secondary | ICD-10-CM

## 2017-05-14 DIAGNOSIS — R200 Anesthesia of skin: Secondary | ICD-10-CM

## 2017-05-14 DIAGNOSIS — R269 Unspecified abnormalities of gait and mobility: Secondary | ICD-10-CM

## 2017-05-14 DIAGNOSIS — R202 Paresthesia of skin: Secondary | ICD-10-CM | POA: Diagnosis not present

## 2017-05-14 NOTE — Therapy (Signed)
[place] [facility] [address] [place], [place], [address] Phone: [phone]   Fax:  [phone]  Physical Therapy Treatment  Patient Details  Name: Susan [name] MRN: [id number] Date of Birth: 1946/04/27 Referring Provider: Dr. Leta Baptist  Encounter Date: 05/14/2017      PT End of Session - 05/14/17 0835    Visit Number 8   Number of Visits 9   Date for PT Re-Evaluation 05/21/17   Authorization - Visit Number 4   Authorization - Number of Visits 10   PT Start Time 0812   PT Stop Time 0906   PT Time Calculation (min) 54 min   Activity Tolerance Patient tolerated treatment well   Behavior During Therapy [facility] for tasks assessed/performed      Past Medical History:  Diagnosis Date  . Colon cancer screening   . Diverticulosis of colon   . IBS (irritable bowel syndrome)   . 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 Migraine, unspecified, without mention of intractable migraine without mention of status migrainosus   . NASH (nonalcoholic steatohepatitis)   . Ocular migraine   . Osteoporosis    on Fosamax for 5 years  . Other abnormal glucose   . Other chronic nonalcoholic liver disease    LFTs normalized with weight loss 2013  . Other screening mammogram   . Symptomatic menopausal or female climacteric states   . Tubular adenoma of colon   . Urinary tract infection, site not specified     Past Surgical History:  Procedure Laterality Date  . abdominal ultrasound  04/12/2004 & 6/06   positive gallstones  . ANKLE FRACTURE SURGERY  ~2002   pinning  . BTL  30+ years ago  . CATARACT EXTRACTION  2013   B eyes  . COLONOSCOPY  06/08/06   Polyps, divertics (Dr. Sharlett Iles)  . CT abdomen  04/29/04   Positive gallstones  . TONSILLECTOMY AND ADENOIDECTOMY  Age 43  . ultrasound pelvis  6/06   Negative    There were no vitals filed for this visit.      Subjective Assessment - 05/14/17 0814    Subjective Pt. reports legs/ feet are doing  better but R hand still has numbness/tingling.     Pertinent History Pt. watches 88 month old grandkid 2 days/week (Wed. and Thursday).     Limitations Lifting;Walking;House hold activities   How long can you walk comfortably? pt. states she can't squat due to ankle pain/ arthritis   Diagnostic tests NCV (see chart)   Patient Stated Goals Decrease numbness/tingling in hand and feet.  Improve balance/ decrease fall risk.     Currently in Pain? No/denies      Therapeutic Exercise:   Nustep L7 10 min. B UE/LE. (warm-up/no charge).  Stair climbing (recip. Pattern with min. To no UE assist).    Resisted gait with 1BTB 5x all 4-planes.   Reviewed HEP    Neuro:  BOSU wt. Shifting/ cone reaching/ turning (in //-bars for safety).    Airex tandem gait in //-bars (min. To no UE assist).    Box B carrying (20#) 3 laps in gym.  2nd shelf to overhead box lifts (10#)- 10x.    Airex standing tasks with nut/bolt assembly.    Airex step ups/touches/ downs 10x each (in //-bars for safety).          PT Long Term Goals - 05/14/17 1254      PT LONG TERM GOAL #1   Title Pt.  I with HEP to increase B UE/LE muscle strength grossly 5/5 MMT to improve functional mobility.     Baseline 5/5 MMT   Time 4   Period Weeks   Status Achieved     PT LONG TERM GOAL #2   Title Pt. will increase LEFS to >60 out of 80 to improve pain-free mobility.     Baseline LEFS: 53 out of 80 on 5/21; 04/23/17: 48/80   Time 4   Period Weeks   Status Partially Met     PT LONG TERM GOAL #3   Title Pt. will increase Berg balance test to >50/56 to improve balance/ decrease fall risk   Baseline Berg: 46/56 on 5/21 (recommend use of SPC); 04/23/17: 54/56   Time 4   Period Weeks   Status Achieved     PT LONG TERM GOAL #4   Title Pt. will report 50% improvement in B hand/feet numbness since start of PT to improve grasp/ decrease dropping objects.     Baseline pt. c/o constant numbness/tingline in B hands and  feet; 04/23/17: 50% improvement in Bil feet numbness, 10% improvement in hand numbness   Period Weeks   Status Partially Met             Plan - May 28, 2017 0835    Clinical Impression Statement Pt. has progressed well with skilled PT services.  Pt. independent with HEP and understands importance of remaining active.  Pt. still has persistent R hand/finger numbness and instructed to contact PT if symptoms worsen.  Pt. is wanting to avoid CTS at this time and focus more on conservative tx.    Clinical Presentation Stable   Clinical Decision Making Moderate   Rehab Potential Fair   PT Frequency 1x / week   PT Duration 4 weeks   PT Treatment/Interventions ADLs/Self Care Home Management;Cryotherapy;Electrical Stimulation;Moist Heat;Balance training;Therapeutic exercise;Therapeutic activities;Functional mobility training;Stair training;Gait training;Neuromuscular re-education;Patient/family education;Passive range of motion;Manual techniques   PT Next Visit Plan Discharge visit with HEP focus   PT Home Exercise Plan putty ex. / HEP   Consulted and Agree with Plan of Care Patient      Patient will benefit from skilled therapeutic intervention in order to improve the following deficits and impairments:  Abnormal gait, Impaired sensation, Pain, Decreased mobility, Postural dysfunction, Hypomobility, Decreased strength, Decreased range of motion, Decreased endurance, Decreased activity tolerance, Decreased balance, Difficulty walking  Visit Diagnosis: Gait difficulty  Muscle weakness (generalized)  Numbness and tingling in both hands  Numbness and tingling of both feet       G-Codes - 05-28-2017 1253    Functional Assessment Tool Used (Outpatient Only) Berg Balance Test, LEFS, functional mobility, clinical judgement, MMT, numbness and tingling   Functional Limitation Mobility: Walking and moving around   Mobility: Walking and Moving Around Current Status [phone]) At least 1 percent but less  than 20 percent impaired, limited or restricted   Mobility: Walking and Moving Around Goal Status [phone]) At least 1 percent but less than 20 percent impaired, limited or restricted   Mobility: Walking and Moving Around Discharge Status [phone]) At least 1 percent but less than 20 percent impaired, limited or restricted      Problem List Patient Active Problem List   Diagnosis Date Noted  . Hand numbness 04/10/2017  . Numbness and tingling of foot 04/10/2017  . Carpal tunnel syndrome of right wrist 04/10/2017  . Neuropathy 02/27/2017  . HLD (hyperlipidemia) 07/23/2016  . Healthcare maintenance 07/23/2016  . Cystocele 05/19/2015  .  Dupuytren contracture 03/24/2015  . Advance care planning 03/24/2015  . Benign paroxysmal positional vertigo 11/09/2014  . Anxiety state 11/09/2014  . Other fatigue 08/27/2014  . Multiple joint pain 07/09/2014  . Medicare annual wellness visit, initial 08/25/2013  . Abdominal pain, other specified site 08/25/2013  . Multiple thyroid nodules 08/06/2013  . Bilateral carotid artery stenosis 05/22/2013  . Memory changes 08/27/2012  . Lipoma 05/05/2011  . Disordered eating 05/05/2011  . B12 deficiency 11/18/2010  . IRRITABLE BOWEL SYNDROME 11/11/2010  . DIVERTICULOSIS, COLON 11/04/2010  . OTHER CHRONIC NONALCOHOLIC LIVER DISEASE 04/75/3391  . COLONIC POLYPS, HX OF 11/04/2010  . Hyperglycemia 08/12/2010  . UTI (urinary tract infection) 09/11/2008  . MIGRAINE HEADACHE 12/27/2007  . Essential hypertension 12/27/2007  . MENOPAUSAL SYNDROME 12/27/2007  . Osteoporosis 11/05/2007  . LIVER FUNCTION TESTS, ABNORMAL, HX OF 05/06/2006   Pura Spice, PT, DPT # [phone] 05/14/2017, 12:55 PM  [place] [address]. [place], [place], [address] Phone: [phone]   Fax:  [phone]  Name: Susan [name] MRN: [id number] Date of Birth: 1945-11-22

## 2017-05-22 DIAGNOSIS — I2510 Atherosclerotic heart disease of native coronary artery without angina pectoris: Secondary | ICD-10-CM | POA: Diagnosis not present

## 2017-05-22 DIAGNOSIS — I4891 Unspecified atrial fibrillation: Secondary | ICD-10-CM | POA: Diagnosis not present

## 2017-05-22 DIAGNOSIS — I10 Essential (primary) hypertension: Secondary | ICD-10-CM | POA: Diagnosis not present

## 2017-05-22 DIAGNOSIS — I340 Nonrheumatic mitral (valve) insufficiency: Secondary | ICD-10-CM | POA: Diagnosis not present

## 2017-05-22 DIAGNOSIS — I351 Nonrheumatic aortic (valve) insufficiency: Secondary | ICD-10-CM | POA: Diagnosis not present

## 2017-05-22 DIAGNOSIS — R9431 Abnormal electrocardiogram [ECG] [EKG]: Secondary | ICD-10-CM | POA: Diagnosis not present

## 2017-05-22 DIAGNOSIS — R0602 Shortness of breath: Secondary | ICD-10-CM | POA: Diagnosis not present

## 2017-08-27 DIAGNOSIS — I340 Nonrheumatic mitral (valve) insufficiency: Secondary | ICD-10-CM | POA: Diagnosis not present

## 2017-08-27 DIAGNOSIS — R0602 Shortness of breath: Secondary | ICD-10-CM | POA: Diagnosis not present

## 2017-08-27 DIAGNOSIS — I351 Nonrheumatic aortic (valve) insufficiency: Secondary | ICD-10-CM | POA: Diagnosis not present

## 2017-08-27 DIAGNOSIS — I10 Essential (primary) hypertension: Secondary | ICD-10-CM | POA: Diagnosis not present

## 2017-08-27 DIAGNOSIS — I2510 Atherosclerotic heart disease of native coronary artery without angina pectoris: Secondary | ICD-10-CM | POA: Diagnosis not present

## 2017-09-10 DIAGNOSIS — I351 Nonrheumatic aortic (valve) insufficiency: Secondary | ICD-10-CM | POA: Diagnosis not present

## 2017-09-10 DIAGNOSIS — I10 Essential (primary) hypertension: Secondary | ICD-10-CM | POA: Diagnosis not present

## 2017-09-10 DIAGNOSIS — R9431 Abnormal electrocardiogram [ECG] [EKG]: Secondary | ICD-10-CM | POA: Diagnosis not present

## 2017-09-10 DIAGNOSIS — I340 Nonrheumatic mitral (valve) insufficiency: Secondary | ICD-10-CM | POA: Diagnosis not present

## 2017-09-10 DIAGNOSIS — I2510 Atherosclerotic heart disease of native coronary artery without angina pectoris: Secondary | ICD-10-CM | POA: Diagnosis not present

## 2017-09-10 DIAGNOSIS — R0602 Shortness of breath: Secondary | ICD-10-CM | POA: Diagnosis not present

## 2017-09-10 DIAGNOSIS — I4891 Unspecified atrial fibrillation: Secondary | ICD-10-CM | POA: Diagnosis not present

## 2017-10-08 DIAGNOSIS — I10 Essential (primary) hypertension: Secondary | ICD-10-CM | POA: Diagnosis not present

## 2017-10-08 DIAGNOSIS — R9431 Abnormal electrocardiogram [ECG] [EKG]: Secondary | ICD-10-CM | POA: Diagnosis not present

## 2017-10-08 DIAGNOSIS — I4891 Unspecified atrial fibrillation: Secondary | ICD-10-CM | POA: Diagnosis not present

## 2017-10-08 DIAGNOSIS — R0602 Shortness of breath: Secondary | ICD-10-CM | POA: Diagnosis not present

## 2017-10-08 DIAGNOSIS — I2510 Atherosclerotic heart disease of native coronary artery without angina pectoris: Secondary | ICD-10-CM | POA: Diagnosis not present

## 2017-12-04 ENCOUNTER — Telehealth: Payer: Self-pay | Admitting: Family Medicine

## 2017-12-04 DIAGNOSIS — M810 Age-related osteoporosis without current pathological fracture: Secondary | ICD-10-CM

## 2017-12-04 NOTE — Telephone Encounter (Signed)
Pt is set up for physical 02/01/18, requesting mammo and bone density orders to be set up at [facility] imaging.Copied from [place]. Topic: Quick Communication - See Telephone Encounter >> Dec 04, 2017  1:52 PM Oneta Rack wrote: CRM for notification. See Telephone encounter for:   12/04/17.  Relation to pt: self Call back number: [phone]   Reason for call:  Patient scheduled physical with PCP for 01/31/2018, patient requesting annual mamo orders placed at previous imaging site (patient unaware of imaging site name).   Patient unsure if she's due for annual bone density and if so requesting orders as well, placed at previous imaging site. Please leave detail message regarding patient request.   >> Dec 04, 2017  2:02 PM Oneta Rack wrote: CRM for notification. See Telephone encounter for:   12/04/17.  Relation to pt: self Call back number: [phone]   Reason for call:  Patient scheduled physical with PCP for 01/31/2018, patient requesting annual mamo orders placed at previous imaging site (patient unaware of imaging site name).   Patient unsure if she's due for annual bone density and if so requesting orders as well, placed at previous imaging site. Please leave detail message regarding patient request.

## 2017-12-05 NOTE — Telephone Encounter (Signed)
Please call pt re: DXA scheduling.  She prev had it done at [facility] facility at [facility] in [place].   She needs mammogram scheduled concurrently.  I don't think she needs an order for the mammogram to be done.   If she does need an order, then let me know.  I put in the order for the DXA.   Thanks.

## 2017-12-05 NOTE — Telephone Encounter (Signed)
Faxed order to [facility].  They will call pt to schedule appointment

## 2017-12-13 ENCOUNTER — Ambulatory Visit (INDEPENDENT_AMBULATORY_CARE_PROVIDER_SITE_OTHER): Payer: Medicare HMO | Admitting: *Deleted

## 2017-12-13 DIAGNOSIS — Z23 Encounter for immunization: Secondary | ICD-10-CM

## 2017-12-17 ENCOUNTER — Encounter: Payer: Self-pay | Admitting: Family Medicine

## 2017-12-17 DIAGNOSIS — Z1231 Encounter for screening mammogram for malignant neoplasm of breast: Secondary | ICD-10-CM | POA: Diagnosis not present

## 2017-12-17 DIAGNOSIS — Z780 Asymptomatic menopausal state: Secondary | ICD-10-CM | POA: Diagnosis not present

## 2017-12-17 DIAGNOSIS — M85852 Other specified disorders of bone density and structure, left thigh: Secondary | ICD-10-CM | POA: Diagnosis not present

## 2017-12-17 DIAGNOSIS — M810 Age-related osteoporosis without current pathological fracture: Secondary | ICD-10-CM | POA: Diagnosis not present

## 2017-12-30 ENCOUNTER — Other Ambulatory Visit: Payer: Self-pay | Admitting: Family Medicine

## 2017-12-30 DIAGNOSIS — I10 Essential (primary) hypertension: Secondary | ICD-10-CM

## 2017-12-30 DIAGNOSIS — E538 Deficiency of other specified B group vitamins: Secondary | ICD-10-CM

## 2017-12-30 DIAGNOSIS — M810 Age-related osteoporosis without current pathological fracture: Secondary | ICD-10-CM

## 2017-12-30 NOTE — Progress Notes (Signed)
Call pt.  DXA worse, with osteoporosis.  Reasonable to consider prolia.  I realize she is s/p 5 years of fosamax prev.  We can talk about it when she comes in for yearly visit.  I put in the orders for labs prior to that visit.  We need to make sure vit D wnl, etc. Thanks.    12/31/2016   Tried to phone patient, no answer, no VM available.  Letter mailed.   Mike Craze, CMA

## 2017-12-31 ENCOUNTER — Encounter: Payer: Self-pay | Admitting: *Deleted

## 2018-01-25 ENCOUNTER — Ambulatory Visit (INDEPENDENT_AMBULATORY_CARE_PROVIDER_SITE_OTHER): Payer: Medicare HMO

## 2018-01-25 VITALS — BP 132/74 | HR 74 | Temp 98.1°F | Ht 61.5 in | Wt 155.2 lb

## 2018-01-25 DIAGNOSIS — M810 Age-related osteoporosis without current pathological fracture: Secondary | ICD-10-CM | POA: Diagnosis not present

## 2018-01-25 DIAGNOSIS — I10 Essential (primary) hypertension: Secondary | ICD-10-CM

## 2018-01-25 DIAGNOSIS — Z0000 Encounter for general adult medical examination without abnormal findings: Secondary | ICD-10-CM

## 2018-01-25 DIAGNOSIS — E538 Deficiency of other specified B group vitamins: Secondary | ICD-10-CM | POA: Diagnosis not present

## 2018-01-25 LAB — LIPID PANEL
CHOL/HDL RATIO: 2
Cholesterol: 111 mg/dL (ref 0–200)
HDL: 44.8 mg/dL (ref >39.00)
LDL CALC: 49 mg/dL (ref 0–99)
NONHDL: 65.78
Triglycerides: 85 mg/dL (ref 0.0–149.0)
VLDL: 17 mg/dL (ref 0.0–40.0)

## 2018-01-25 LAB — TSH: TSH: 3.06 u[IU]/mL (ref 0.35–4.50)

## 2018-01-25 LAB — COMPREHENSIVE METABOLIC PANEL
ALT: 26 U/L (ref 0–35)
AST: 27 U/L (ref 0–37)
Albumin: 4.1 g/dL (ref 3.5–5.2)
Alkaline Phosphatase: 38 U/L — ABNORMAL LOW (ref 39–117)
BUN: 36 mg/dL — AB (ref 6–23)
CHLORIDE: 105 meq/L (ref 96–112)
CO2: 28 mEq/L (ref 19–32)
CREATININE: 0.79 mg/dL (ref 0.40–1.20)
Calcium: 9.5 mg/dL (ref 8.4–10.5)
GFR: 75.91 mL/min (ref >60.00)
Glucose, Bld: 119 mg/dL — ABNORMAL HIGH (ref 70–99)
Potassium: 4.4 mEq/L (ref 3.5–5.1)
SODIUM: 142 meq/L (ref 135–145)
Total Bilirubin: 0.4 mg/dL (ref 0.2–1.2)
Total Protein: 7 g/dL (ref 6.0–8.3)

## 2018-01-25 LAB — VITAMIN B12: Vitamin B-12: 533 pg/mL (ref 211–911)

## 2018-01-25 LAB — VITAMIN D 25 HYDROXY (VIT D DEFICIENCY, FRACTURES): VITD: 29.23 ng/mL — ABNORMAL LOW (ref 30.00–100.00)

## 2018-01-25 NOTE — Progress Notes (Signed)
Subjective:   Susan [name] is a 72 y.o. female who presents for Medicare Annual (Subsequent) preventive examination.  Review of Systems:  N/A Cardiac Risk Factors include: advanced age (>90mn, >>31women);hypertension     Objective:     Vitals: BP 132/74 (BP Location: Right Arm, Patient Position: Sitting, Cuff Size: Normal)   Pulse 74   Temp 98.1 F (36.7 C) (Oral)   Ht 5' 1.5" (1.562 m) Comment: no shoes  Wt 155 lb 4 oz (70.4 kg)   SpO2 98%   BMI 28.86 kg/m   Body mass index is 28.86 kg/m.  Advanced Directives 01/25/2018 07/13/2016  Does Patient Have a Medical Advance Directive? Yes No  Type of AParamedicof AStowellLiving will -  Copy of [place] Chart? No - copy requested -  Would patient like information on creating a medical advance directive? - Yes - EScientist, clinical (histocompatibility and immunogenetics)given    Tobacco Social History   Tobacco Use  Smoking Status Former Smoker  . Types: Cigarettes  . Last attempt to quit: 03/19/2001  . Years since quitting: 16.8  Smokeless Tobacco Never Used  Tobacco Comment   Quit in 2001     Counseling given: No Comment: Quit in 2001   Clinical Intake:  Pre-visit preparation completed: Yes  Pain : No/denies pain Pain Score: 0-No pain     Nutritional Status: BMI > 30  Obese Nutritional Risks: None Diabetes: No  How often do you need to have someone help you when you read instructions, pamphlets, or other written materials from your doctor or pharmacy?: 1 - Never What is the last grade level you completed in school?: 12th grade + some college courses  Interpreter Needed?: No  Comments: pt lives with spouse Information entered by :: LPinson, LPN  Past Medical History:  Diagnosis Date  . Colon cancer screening   . Diverticulosis of colon   . IBS (irritable bowel syndrome)   . 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 Migraine, unspecified, without mention of intractable migraine without mention  of status migrainosus   . NASH (nonalcoholic steatohepatitis)   . Ocular migraine   . Osteoporosis    on Fosamax for 5 years  . Other abnormal glucose   . Other chronic nonalcoholic liver disease    LFTs normalized with weight loss 2013  . Other screening mammogram   . Symptomatic menopausal or female climacteric states   . Tubular adenoma of colon   . Urinary tract infection, site not specified    Past Surgical History:  Procedure Laterality Date  . abdominal ultrasound  04/12/2004 & 6/06   positive gallstones  . ANKLE FRACTURE SURGERY  ~2002   pinning  . BTL  30+ years ago  . CATARACT EXTRACTION  2013   B eyes  . COLONOSCOPY  06/08/06   Polyps, divertics (Dr. PSharlett Iles  . CT abdomen  04/29/04   Positive gallstones  . TONSILLECTOMY AND ADENOIDECTOMY  Age 72 . ultrasound pelvis  6/06   Negative   Family History  Problem Relation Age of Onset  . Depression Mother        Manic depression  . Diabetes Mother        Type 2  . Breast cancer Mother   . Colon polyps Father   . Heart disease Father   . Breast cancer Maternal Aunt   . Breast cancer Paternal Aunt   . Crohn's disease Brother   . Osteoporosis Brother   .  Cancer Neg Hx   . Colon cancer Neg Hx    Social History   Socioeconomic History  . Marital status: Married    Spouse name: Not on file  . Number of children: 2  . Years of education: 79  . Highest education level: Not on file  Occupational History  . 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 Financial resource strain: Not on file  . Food insecurity:    Worry: Not on file    Inability: Not on file  . Transportation needs:    Medical: Not on file    Non-medical: Not on file  Tobacco Use  . Smoking status: Former Smoker    Types: Cigarettes    Last attempt to quit: 03/19/2001    Years since quitting: 16.8  . Smokeless tobacco: Never Used  . Tobacco comment: Quit in 2001  Substance and Sexual Activity    . Alcohol use: No    Alcohol/week: 0.0 oz    Comment: Occasional  . Drug use: No  . Sexual activity: Never  Lifestyle  . Physical activity:    Days per week: Not on file    Minutes per session: Not on file  . Stress: Not on file  Relationships  . Social connections:    Talks on phone: Not on file    Gets together: Not on file    Attends religious service: Not on file    Active member of club or organization: Not on file    Attends meetings of clubs or organizations: Not on file    Relationship status: Not on file  Other Topics Concern  . Not on file  Social History Narrative   From [place]. Lives with husband, E njoys times with 5 grandkids, 1 great grandchild due fall 2017   Minimal exercise   Daily Caffeine Use: 3 daily.    Outpatient Encounter Medications as of 01/25/2018  Medication Sig  . acetaminophen (TYLENOL) 500 MG tablet Take 1,000 mg by mouth 2 (two) times daily.  Marland Kitchen aspirin 81 MG tablet Take 81 mg by mouth daily.  Marland Kitchen atorvastatin (LIPITOR) 40 MG tablet Take 0.5 tablets (20 mg total) by mouth every other day. (Patient taking differently: Take 20 mg by mouth daily. )  . Cholecalciferol 1000 units tablet Take 1 tablet by mouth daily.   Marland Kitchen lisinopril-hydrochlorothiazide (PRINZIDE,ZESTORETIC) 20-25 MG tablet   . meclizine (ANTIVERT) 12.5 MG tablet Take 1 tablet (12.5 mg total) by mouth 3 (three) times daily as needed for dizziness.  . metoprolol succinate (TOPROL-XL) 25 MG 24 hr tablet   . vitamin B-12 (CYANOCOBALAMIN) 1000 MCG tablet Take 1,000 mcg by mouth daily.  . [DISCONTINUED] clopidogrel (PLAVIX) 75 MG tablet Take 75 mg by mouth daily.  . [DISCONTINUED] lisinopril (PRINIVIL,ZESTRIL) 5 MG tablet Take 5 mg by mouth daily.  . [DISCONTINUED] metoprolol (LOPRESSOR) 50 MG tablet Take 50 mg by mouth at bedtime.   No facility-administered encounter medications on file as of 01/25/2018.     Activities of Daily Living In your present state of health, do you have any  difficulty performing the following activities: 01/25/2018  Hearing? Y  Vision? Y  Difficulty concentrating or making decisions? N  Walking or climbing stairs? N  Dressing or bathing? N  Doing errands, shopping? N  Preparing Food and eating ? N  Using the Toilet? N  In the past six months, have you accidently leaked urine? N  Do you  have problems with loss of bowel control? N  Managing your Medications? N  Managing your Finances? N  Housekeeping or managing your Housekeeping? N  Some recent data might be hidden    Patient Care Team: Tonia Ghent, MD as PCP - General Celesta Gentile, MD as Consulting Physician (Cardiology)    Assessment:   This is a routine wellness examination for Mikel.  Exercise Activities and Dietary recommendations Current Exercise Habits: The patient does not participate in regular exercise at present, Exercise limited by: None identified  Goals    . Follow up with Primary Care Provider     Starting 01/25/2018, I will continue to take medications as prescribed and to keep appointments with PCP as scheduled.        Fall Risk Fall Risk  01/25/2018 03/19/2017 07/13/2016 03/22/2015 08/25/2013  Falls in the past year? No No No No No    Depression Screen PHQ 2/9 Scores 01/25/2018 07/13/2016 03/22/2015 08/25/2013  PHQ - 2 Score 0 0 0 0  PHQ- 9 Score 1 - - -     Cognitive Function MMSE - Mini Mental State Exam 01/25/2018 07/13/2016  Orientation to time 5 5  Orientation to Place 5 5  Registration 3 3  Attention/ Calculation 0 0  Recall 2 3  Recall-comments unable to recall 1 of 3 words -  Language- name 2 objects 0 0  Language- repeat 1 1  Language- follow 3 step command 3 3  Language- read & follow direction 0 0  Write a sentence 0 0  Copy design 0 0  Total score 19 20     PLEASE NOTE: A Mini-Cog screen was completed. Maximum score is 20. A value of 0 denotes this part of Folstein MMSE was not completed or the patient failed this part of the Mini-Cog  screening.   Mini-Cog Screening Orientation to Time - Max 5 pts Orientation to Place - Max 5 pts Registration - Max 3 pts Recall - Max 3 pts Language Repeat - Max 1 pts Language Follow 3 Step Command - Max 3 pts     Immunization History  Administered Date(s) Administered  . Influenza Whole 08/12/2010  . Influenza, Seasonal, Injecte, Preservative Fre 12/13/2012  . Influenza,inj,Quad PF,6+ Mos 08/25/2013, 09/03/2014, 07/13/2016, 12/13/2017  . Pneumococcal Conjugate-13 03/22/2015  . Pneumococcal Polysaccharide-23 08/12/2010  . Td 11/06/1998, 08/12/2010    Screening Tests Health Maintenance  Topic Date Due  . MAMMOGRAM  10/09/2018  . TETANUS/TDAP  08/12/2020  . COLONOSCOPY  03/22/2021  . INFLUENZA VACCINE  Completed  . DEXA SCAN  Completed  . Hepatitis C Screening  Completed  . PNA vac Low Risk Adult  Completed      Plan:     I have personally reviewed, addressed, and noted the following in the patient's chart:  A. Medical and social history B. Use of alcohol, tobacco or illicit drugs  C. Current medications and supplements D. Functional ability and status E.  Nutritional status F.  Physical activity G. Advance directives H. List of other physicians I.  Hospitalizations, surgeries, and ER visits in previous 12 months J.  [place] to include hearing, vision, cognitive, depression L. Referrals and appointments - none  In addition, I have reviewed and discussed with patient certain preventive protocols, quality metrics, and best practice recommendations. A written personalized care plan for preventive services as well as general preventive health recommendations were provided to patient.  See attached scanned questionnaire for additional information.   Signed,  Lindell Noe, MHA, BS, LPN Health Coach

## 2018-01-25 NOTE — Progress Notes (Signed)
PCP notes:   Health maintenance:  No gaps identified.  Abnormal screenings:   Hearing - failed  Hearing Screening   125Hz  250Hz  500Hz  1000Hz  2000Hz  3000Hz  4000Hz  6000Hz  8000Hz   Right ear:   40 0 40  0    Left ear:   40 40 40  0     Depression score: 1 Depression screen [facility] 2/9 01/25/2018 07/13/2016 03/22/2015 08/25/2013  Decreased Interest 0 0 0 0  Down, Depressed, Hopeless 0 0 0 0  PHQ - 2 Score 0 0 0 0  Altered sleeping 0 - - -  Tired, decreased energy 1 - - -  Change in appetite 0 - - -  Feeling bad or failure about yourself  0 - - -  Trouble concentrating 0 - - -  Moving slowly or fidgety/restless 0 - - -  Suicidal thoughts 0 - - -  PHQ-9 Score 1 - - -  Difficult doing work/chores Somewhat difficult - - -   Mini-Cog score: 19/20 MMSE - Mini Mental State Exam 01/25/2018 07/13/2016  Orientation to time 5 5  Orientation to Place 5 5  Registration 3 3  Attention/ Calculation 0 0  Recall 2 3  Recall-comments unable to recall 1 of 3 words -  Language- name 2 objects 0 0  Language- repeat 1 1  Language- follow 3 step command 3 3  Language- read & follow direction 0 0  Write a sentence 0 0  Copy design 0 0  Total score 19 20    Patient concerns:   None  Nurse concerns:  None  Next PCP appt:   01/31/18 @ 0945  I reviewed health advisor's note, was available for consultation on the day of service listed in this note, and agree with documentation and plan. Elsie Stain, MD.

## 2018-01-25 NOTE — Patient Instructions (Addendum)
Susan [name] , Thank you for taking time to come for your Medicare Wellness Visit. I appreciate your ongoing commitment to your health goals. Please review the following plan we discussed and let me know if I can assist you in the future.   These are the goals we discussed: Goals    . Follow up with Primary Care Provider     Starting 01/25/2018, I will continue to take medications as prescribed and to keep appointments with PCP as scheduled.        This is a list of the screening recommended for you and due dates:  Health Maintenance  Topic Date Due  . Mammogram  10/09/2018  . Tetanus Vaccine  08/12/2020  . Colon Cancer Screening  03/22/2021  . Flu Shot  Completed  . DEXA scan (bone density measurement)  Completed  .  Hepatitis C: One time screening is recommended by Center for Disease Control  (CDC) for  adults born from [birth year] through 1966.   Completed  . Pneumonia vaccines  Completed   Preventive Care for Adults  A healthy lifestyle and preventive care can promote health and wellness. Preventive health guidelines for adults include the following key practices.  . A routine yearly physical is a good way to check with your health care provider about your health and preventive screening. It is a chance to share any concerns and updates on your health and to receive a thorough exam.  . Visit your dentist for a routine exam and preventive care every 6 months. Brush your teeth twice a day and floss once a day. Good oral hygiene prevents tooth decay and gum disease.  . The frequency of eye exams is based on your age, health, family medical history, use  of contact lenses, and other factors. Follow your health care provider's recommendations for frequency of eye exams.  . Eat a healthy diet. Foods like vegetables, fruits, whole grains, low-fat dairy products, and lean protein foods contain the nutrients you need without too many calories. Decrease your intake of foods high in solid fats,  added sugars, and salt. Eat the right amount of calories for you. Get information about a proper diet from your health care provider, if necessary.  . Regular physical exercise is one of the most important things you can do for your health. Most adults should get at least 150 minutes of moderate-intensity exercise (any activity that increases your heart rate and causes you to sweat) each week. In addition, most adults need muscle-strengthening exercises on 2 or more days a week.  Silver Sneakers may be a benefit available to you. To determine eligibility, you may visit the website: www.silversneakers.com or contact program at 909-429-9396 Mon-Fri between 8AM-8PM.   . Maintain a healthy weight. The body mass index (BMI) is a screening tool to identify possible weight problems. It provides an estimate of body fat based on height and weight. Your health care provider can find your BMI and can help you achieve or maintain a healthy weight.   For adults 20 years and older: ? A BMI below 18.5 is considered underweight. ? A BMI of 18.5 to 24.9 is normal. ? A BMI of 25 to 29.9 is considered overweight. ? A BMI of 30 and above is considered obese.   . Maintain normal blood lipids and cholesterol levels by exercising and minimizing your intake of saturated fat. Eat a balanced diet with plenty of fruit and vegetables. Blood tests for lipids and cholesterol should begin at age  20 and be repeated every 5 years. If your lipid or cholesterol levels are high, you are over 50, or you are at high risk for heart disease, you may need your cholesterol levels checked more frequently. Ongoing high lipid and cholesterol levels should be treated with medicines if diet and exercise are not working.  . If you smoke, find out from your health care provider how to quit. If you do not use tobacco, please do not start.  . If you choose to drink alcohol, please do not consume more than 2 drinks per day. One drink is  considered to be 12 ounces (355 mL) of beer, 5 ounces (148 mL) of wine, or 1.5 ounces (44 mL) of liquor.  . If you are 58-33 years old, ask your health care provider if you should take aspirin to prevent strokes.  . Use sunscreen. Apply sunscreen liberally and repeatedly throughout the day. You should seek shade when your shadow is shorter than you. Protect yourself by wearing long sleeves, pants, a wide-brimmed hat, and sunglasses year round, whenever you are outdoors.  . Once a month, do a whole body skin exam, using a mirror to look at the skin on your back. Tell your health care provider of new moles, moles that have irregular borders, moles that are larger than a pencil eraser, or moles that have changed in shape or color.

## 2018-01-31 ENCOUNTER — Ambulatory Visit (INDEPENDENT_AMBULATORY_CARE_PROVIDER_SITE_OTHER): Payer: Medicare HMO | Admitting: Family Medicine

## 2018-01-31 ENCOUNTER — Encounter: Payer: Self-pay | Admitting: Family Medicine

## 2018-01-31 VITALS — BP 132/74 | HR 74 | Temp 98.1°F | Ht 61.5 in | Wt 155.2 lb

## 2018-01-31 DIAGNOSIS — I10 Essential (primary) hypertension: Secondary | ICD-10-CM | POA: Diagnosis not present

## 2018-01-31 DIAGNOSIS — E538 Deficiency of other specified B group vitamins: Secondary | ICD-10-CM

## 2018-01-31 DIAGNOSIS — Z7189 Other specified counseling: Secondary | ICD-10-CM

## 2018-01-31 DIAGNOSIS — M810 Age-related osteoporosis without current pathological fracture: Secondary | ICD-10-CM | POA: Diagnosis not present

## 2018-01-31 DIAGNOSIS — R739 Hyperglycemia, unspecified: Secondary | ICD-10-CM

## 2018-01-31 DIAGNOSIS — E042 Nontoxic multinodular goiter: Secondary | ICD-10-CM

## 2018-01-31 DIAGNOSIS — E785 Hyperlipidemia, unspecified: Secondary | ICD-10-CM

## 2018-01-31 DIAGNOSIS — Z0000 Encounter for general adult medical examination without abnormal findings: Secondary | ICD-10-CM

## 2018-01-31 MED ORDER — ATORVASTATIN CALCIUM 20 MG PO TABS: 20.0000 mg | ORAL_TABLET | Freq: Every day | ORAL | Status: DC

## 2018-01-31 MED ORDER — CHOLECALCIFEROL 25 MCG (1000 UT) PO TABS: 2000.0000 [IU] | ORAL_TABLET | Freq: Every day | ORAL | Status: DC

## 2018-01-31 NOTE — Progress Notes (Signed)
Hearing - failed.  Declined hearing aids.   2/3 memory recall recently.  D/w pt.  No red flag events.  3/3 recall today.  Neither she nor I think she has a sig memory disorder.   Vaccines d/w pt.  shingrix is back ordered.   Colonoscopy 2012 Breast cancer screening- done 2019 Advance directive- husband then oldest daughter Maudie Mercury designated if patient were incapacitated.    Osteoporosis.  S/p 5 years of fosamax prev. DXA done 2019.  Vit D nearly normal.  I want to consider options and then d/w pt.  She agrees.   Elevated Cholesterol: Using medications without problems:yes Muscle aches: some "growing pains" Diet compliance: she cut out fried foods.  D/w pt about healthy choices, ie low carb options.   Exercise: not organized.  D/w pt.  She is active with grandkids.   Fatigue noted, she isn't sleeping well and she thought aches---> sleep changes---> fatigue.   Aches started after statin start.    B12 def. Wnl. Compliant on replacement.  Labs d/w pt.    Hypertension:    Using medication without problems or lightheadedness: yes Chest pain with exertion:no Edema:no Short of breath: she notes deconditioning, d/w pt.   Labs d/w pt.   H/o thyroid nodules.  TSH wnl.  Prev neg bx.  D/w pt.  No dysphagia.    Her daughter and son and law are going through a separation and this has been tough on the patient.  Daughter, 2 granddaughters, 2 cats, 1 dog moved in with patient.  She is trying to adjust to the change.  Meds, vitals, and allergies reviewed.   PMH and SH reviewed  ROS: Per HPI unless specifically indicated in ROS section   GEN: nad, alert and oriented HEENT: mucous membranes moist NECK: supple w/o LA CV: rrr. PULM: ctab, no inc wob ABD: soft, +bs EXT: no edema SKIN: no acute rash

## 2018-01-31 NOTE — Patient Instructions (Addendum)
Please stop the atorvastatin for about 1 week and see if the aches get better.  If so, then please contact cardiology.   Increase your vitamin D to 2000 units a day.   Try to keep cutting back on sweets/carbs and get a little more exercise.   Take care.  Glad to see you.

## 2018-01-31 NOTE — Assessment & Plan Note (Addendum)
Advance directive- husband then oldest daughter Maudie Mercury designated if patient were incapacitated.

## 2018-02-03 DIAGNOSIS — Z0000 Encounter for general adult medical examination without abnormal findings: Secondary | ICD-10-CM | POA: Insufficient documentation

## 2018-02-03 NOTE — Assessment & Plan Note (Signed)
H/o B12 def. Wnl. Compliant on replacement.  Labs d/w pt.  Would continue as is.

## 2018-02-03 NOTE — Assessment & Plan Note (Addendum)
S/p 5 years of fosamax prev. DXA done 2019.  Vit D nearly normal.  I want to consider options and then d/w pt.  She agrees.  Would increase vitamin D to 2000 units a day in the meantime.  >25 minutes spent in face to face time with patient, >50% spent in counselling or coordination of care, discussing labs, options, diet, exercise, etc.  See after visit summary.

## 2018-02-03 NOTE — Assessment & Plan Note (Signed)
Hearing - failed.  Declined hearing aids.   2/3 memory recall recently.  D/w pt.  No red flag events.  3/3 recall today.  Neither she nor I think she has a sig memory disorder.   Vaccines d/w pt.  shingrix is back ordered.   Colonoscopy 2012 Breast cancer screening- done 2019 Advance directive- husband then oldest daughter Maudie Mercury designated if patient were incapacitated.

## 2018-02-03 NOTE — Assessment & Plan Note (Signed)
TSH wnl.  Prev neg bx.  D/w pt.  No dysphagia.  No tx needed now.

## 2018-02-03 NOTE — Assessment & Plan Note (Signed)
D/w pt about diet/exercise, labs.

## 2018-02-03 NOTE — Assessment & Plan Note (Signed)
D/w pt about diet/exercise, labs.  No change in meds.

## 2018-02-03 NOTE — Assessment & Plan Note (Signed)
Fatigue noted, she isn't sleeping well and she thought aches---> sleep changes---> fatigue.   Aches started after statin start.   She'll hold statin for about 1 week and then update cardiology if better.  See AVS.

## 2018-02-04 DIAGNOSIS — H35371 Puckering of macula, right eye: Secondary | ICD-10-CM | POA: Diagnosis not present

## 2018-02-04 DIAGNOSIS — H524 Presbyopia: Secondary | ICD-10-CM | POA: Diagnosis not present

## 2018-02-06 DIAGNOSIS — H3561 Retinal hemorrhage, right eye: Secondary | ICD-10-CM | POA: Diagnosis not present

## 2018-02-06 DIAGNOSIS — H353211 Exudative age-related macular degeneration, right eye, with active choroidal neovascularization: Secondary | ICD-10-CM | POA: Diagnosis not present

## 2018-02-06 DIAGNOSIS — H35362 Drusen (degenerative) of macula, left eye: Secondary | ICD-10-CM | POA: Diagnosis not present

## 2018-02-06 DIAGNOSIS — H353132 Nonexudative age-related macular degeneration, bilateral, intermediate dry stage: Secondary | ICD-10-CM | POA: Diagnosis not present

## 2018-03-11 DIAGNOSIS — H353211 Exudative age-related macular degeneration, right eye, with active choroidal neovascularization: Secondary | ICD-10-CM | POA: Diagnosis not present

## 2018-03-11 DIAGNOSIS — H3561 Retinal hemorrhage, right eye: Secondary | ICD-10-CM | POA: Diagnosis not present

## 2018-03-11 DIAGNOSIS — H353132 Nonexudative age-related macular degeneration, bilateral, intermediate dry stage: Secondary | ICD-10-CM | POA: Diagnosis not present

## 2018-03-18 ENCOUNTER — Telehealth: Payer: Self-pay | Admitting: Family Medicine

## 2018-03-18 DIAGNOSIS — M810 Age-related osteoporosis without current pathological fracture: Secondary | ICD-10-CM

## 2018-03-18 NOTE — Telephone Encounter (Signed)
Please call patient.  I have been considering options about treatment for osteoporosis for her.  It is likely the best option to make sure her vitamin D is above 30.  She was going to increase her vitamin D intake when we talked about it earlier this year.  It is reasonable to recheck her vitamin D level this summer in June or July.  I put in the order about that.  I would not change anything else until her vitamin D level was higher.  If she were going to take extra medication beyond vitamin D she might be a candidate for Prolia.  There are potential issues with side effects with this medication.  She has already taking 5 years of Fosamax so she has already gotten significant benefit from that.  It would likely be the case where she would decide between taking Prolia for an extended period of time versus not doing anything extra.  Given that she is already taken 5 years of Fosamax, it may be reasonable to defer treatment with Prolia at this point.  It really depends on how aggressive she wants to be.  If she wants to talk about potential side effects from Prolia then we can address that after I see her vitamin D level.  Thanks.

## 2018-03-18 NOTE — Telephone Encounter (Signed)
Patient notified as instructed by telephone and verbalized understanding.  Follow-up lab appointment scheduled.

## 2018-03-25 DIAGNOSIS — R0602 Shortness of breath: Secondary | ICD-10-CM | POA: Diagnosis not present

## 2018-03-25 DIAGNOSIS — I340 Nonrheumatic mitral (valve) insufficiency: Secondary | ICD-10-CM | POA: Diagnosis not present

## 2018-03-25 DIAGNOSIS — I2510 Atherosclerotic heart disease of native coronary artery without angina pectoris: Secondary | ICD-10-CM | POA: Diagnosis not present

## 2018-03-25 DIAGNOSIS — I10 Essential (primary) hypertension: Secondary | ICD-10-CM | POA: Diagnosis not present

## 2018-03-25 DIAGNOSIS — R9431 Abnormal electrocardiogram [ECG] [EKG]: Secondary | ICD-10-CM | POA: Diagnosis not present

## 2018-03-25 DIAGNOSIS — I4891 Unspecified atrial fibrillation: Secondary | ICD-10-CM | POA: Diagnosis not present

## 2018-03-25 DIAGNOSIS — I351 Nonrheumatic aortic (valve) insufficiency: Secondary | ICD-10-CM | POA: Diagnosis not present

## 2018-04-15 DIAGNOSIS — H353211 Exudative age-related macular degeneration, right eye, with active choroidal neovascularization: Secondary | ICD-10-CM | POA: Diagnosis not present

## 2018-04-15 DIAGNOSIS — H3561 Retinal hemorrhage, right eye: Secondary | ICD-10-CM | POA: Diagnosis not present

## 2018-04-15 DIAGNOSIS — H353132 Nonexudative age-related macular degeneration, bilateral, intermediate dry stage: Secondary | ICD-10-CM | POA: Diagnosis not present

## 2018-05-06 ENCOUNTER — Other Ambulatory Visit (INDEPENDENT_AMBULATORY_CARE_PROVIDER_SITE_OTHER): Payer: Medicare HMO

## 2018-05-06 DIAGNOSIS — M810 Age-related osteoporosis without current pathological fracture: Secondary | ICD-10-CM

## 2018-05-06 LAB — VITAMIN D 25 HYDROXY (VIT D DEFICIENCY, FRACTURES): VITD: 35.2 ng/mL (ref 30.00–100.00)

## 2018-05-27 ENCOUNTER — Encounter: Payer: Self-pay | Admitting: Family Medicine

## 2018-05-27 ENCOUNTER — Ambulatory Visit (INDEPENDENT_AMBULATORY_CARE_PROVIDER_SITE_OTHER): Payer: Medicare HMO | Admitting: Family Medicine

## 2018-05-27 VITALS — BP 102/64 | HR 77 | Temp 98.4°F | Ht 61.5 in | Wt 155.0 lb

## 2018-05-27 DIAGNOSIS — E785 Hyperlipidemia, unspecified: Secondary | ICD-10-CM | POA: Diagnosis not present

## 2018-05-27 DIAGNOSIS — M810 Age-related osteoporosis without current pathological fracture: Secondary | ICD-10-CM

## 2018-05-27 NOTE — Patient Instructions (Addendum)
Keep taking vit D 2000 units a day, with calcium.  It is reasonable to defer to prolia for now- think about it and let me know if you change your mind.   Keep moving and working on your balance.  Update me as needed.  We can recheck your lipids and see about options at a physical.  Take care.  Glad to see you.

## 2018-05-27 NOTE — Progress Notes (Signed)
D/w pt about prev bone density test and options.  We talked about adequate vitamin D replacement, fall cautions, osteoporosis pathophysiology.  If she were going to take extra medication beyond vitamin D she might be a candidate for Prolia. She has already taking 5 years of Fosamax so she has already gotten significant benefit from that.  It would likely be the case where she would decide between taking Prolia for an extended period of time versus not doing anything else at this point.  Given that she has already taken 5 years of Fosamax, it may be reasonable to defer treatment with Prolia at this point.  It really depends on how aggressive she wants to be.    We talked about routine cautions with Prolia including the possibility of atypical long bone fractures and osteonecrosis.  Aches are clearly better off lipitor.  D/w pt about options. It may be reasonable to table her lipids for now and readdress at CPE later on.   She agrees.   PMH and SH reviewed  ROS: Per HPI unless specifically indicated in ROS section   Meds, vitals, and allergies reviewed.   nad ncat rrr ctab abd soft not ttp

## 2018-05-28 DIAGNOSIS — H353211 Exudative age-related macular degeneration, right eye, with active choroidal neovascularization: Secondary | ICD-10-CM | POA: Diagnosis not present

## 2018-05-28 DIAGNOSIS — H3561 Retinal hemorrhage, right eye: Secondary | ICD-10-CM | POA: Diagnosis not present

## 2018-05-28 DIAGNOSIS — H353132 Nonexudative age-related macular degeneration, bilateral, intermediate dry stage: Secondary | ICD-10-CM | POA: Diagnosis not present

## 2018-05-28 DIAGNOSIS — H4051X3 Glaucoma secondary to other eye disorders, right eye, severe stage: Secondary | ICD-10-CM | POA: Diagnosis not present

## 2018-05-28 LAB — HM DIABETES EYE EXAM

## 2018-05-30 NOTE — Assessment & Plan Note (Signed)
Aches are clearly better off lipitor.  D/w pt about options. It may be reasonable to table her lipids for now and readdress at CPE later on.   She agrees.

## 2018-05-30 NOTE — Assessment & Plan Note (Signed)
D/w pt about prev bone density test and options.  We talked about adequate vitamin D replacement, fall cautions, osteoporosis pathophysiology.  If she were going to take extra medication beyond vitamin D she might be a candidate for Prolia. She has already taking 5 years of Fosamax so she has already gotten significant benefit from that.  It would likely be the case where she would decide between taking Prolia for an extended period of time versus not doing anything else at this point.  Given that she has already taken 5 years of Fosamax, it may be reasonable to defer treatment with Prolia at this point.  It really depends on how aggressive she wants to be.    We talked about routine cautions with Prolia including the possibility of atypical long bone fractures and osteonecrosis.  She wanted to defer Prolia for now.  I think this is reasonable.  If she changes her mind and wants to be more aggressive with treatment then she will let me know.  Keep taking vit D 2000 units a day, with calcium.  >25 minutes spent in face to face time with patient, >50% spent in counselling or coordination of care.

## 2018-05-31 ENCOUNTER — Encounter: Payer: Self-pay | Admitting: Family Medicine

## 2018-07-16 DIAGNOSIS — H3561 Retinal hemorrhage, right eye: Secondary | ICD-10-CM | POA: Diagnosis not present

## 2018-07-16 DIAGNOSIS — H353211 Exudative age-related macular degeneration, right eye, with active choroidal neovascularization: Secondary | ICD-10-CM | POA: Diagnosis not present

## 2018-09-24 DIAGNOSIS — H43811 Vitreous degeneration, right eye: Secondary | ICD-10-CM | POA: Diagnosis not present

## 2018-09-24 DIAGNOSIS — H3561 Retinal hemorrhage, right eye: Secondary | ICD-10-CM | POA: Diagnosis not present

## 2018-09-24 DIAGNOSIS — H353211 Exudative age-related macular degeneration, right eye, with active choroidal neovascularization: Secondary | ICD-10-CM | POA: Diagnosis not present

## 2018-10-08 DIAGNOSIS — I351 Nonrheumatic aortic (valve) insufficiency: Secondary | ICD-10-CM | POA: Diagnosis not present

## 2018-10-08 DIAGNOSIS — I2510 Atherosclerotic heart disease of native coronary artery without angina pectoris: Secondary | ICD-10-CM | POA: Diagnosis not present

## 2018-10-08 DIAGNOSIS — R0602 Shortness of breath: Secondary | ICD-10-CM | POA: Diagnosis not present

## 2018-10-08 DIAGNOSIS — I10 Essential (primary) hypertension: Secondary | ICD-10-CM | POA: Diagnosis not present

## 2018-10-08 DIAGNOSIS — I340 Nonrheumatic mitral (valve) insufficiency: Secondary | ICD-10-CM | POA: Diagnosis not present

## 2018-10-08 DIAGNOSIS — R55 Syncope and collapse: Secondary | ICD-10-CM | POA: Diagnosis not present

## 2018-10-15 DIAGNOSIS — R079 Chest pain, unspecified: Secondary | ICD-10-CM | POA: Diagnosis not present

## 2018-10-16 DIAGNOSIS — I2510 Atherosclerotic heart disease of native coronary artery without angina pectoris: Secondary | ICD-10-CM | POA: Diagnosis not present

## 2018-10-16 DIAGNOSIS — R42 Dizziness and giddiness: Secondary | ICD-10-CM | POA: Diagnosis not present

## 2018-10-16 DIAGNOSIS — E785 Hyperlipidemia, unspecified: Secondary | ICD-10-CM | POA: Diagnosis not present

## 2018-10-16 DIAGNOSIS — I340 Nonrheumatic mitral (valve) insufficiency: Secondary | ICD-10-CM | POA: Diagnosis not present

## 2018-10-16 DIAGNOSIS — I10 Essential (primary) hypertension: Secondary | ICD-10-CM | POA: Diagnosis not present

## 2018-10-16 DIAGNOSIS — R55 Syncope and collapse: Secondary | ICD-10-CM | POA: Diagnosis not present

## 2018-10-16 DIAGNOSIS — R0602 Shortness of breath: Secondary | ICD-10-CM | POA: Diagnosis not present

## 2018-10-16 DIAGNOSIS — I351 Nonrheumatic aortic (valve) insufficiency: Secondary | ICD-10-CM | POA: Diagnosis not present

## 2018-10-22 DIAGNOSIS — R55 Syncope and collapse: Secondary | ICD-10-CM | POA: Diagnosis not present

## 2018-10-23 DIAGNOSIS — I2510 Atherosclerotic heart disease of native coronary artery without angina pectoris: Secondary | ICD-10-CM | POA: Diagnosis not present

## 2018-10-23 DIAGNOSIS — E039 Hypothyroidism, unspecified: Secondary | ICD-10-CM | POA: Diagnosis not present

## 2018-10-23 DIAGNOSIS — E048 Other specified nontoxic goiter: Secondary | ICD-10-CM | POA: Diagnosis not present

## 2018-10-23 DIAGNOSIS — R55 Syncope and collapse: Secondary | ICD-10-CM | POA: Diagnosis not present

## 2018-10-23 DIAGNOSIS — R0602 Shortness of breath: Secondary | ICD-10-CM | POA: Diagnosis not present

## 2018-10-23 DIAGNOSIS — I10 Essential (primary) hypertension: Secondary | ICD-10-CM | POA: Diagnosis not present

## 2018-10-31 ENCOUNTER — Telehealth: Payer: Self-pay | Admitting: *Deleted

## 2018-10-31 DIAGNOSIS — E042 Nontoxic multinodular goiter: Secondary | ICD-10-CM

## 2018-10-31 DIAGNOSIS — E049 Nontoxic goiter, unspecified: Secondary | ICD-10-CM

## 2018-10-31 NOTE — Telephone Encounter (Signed)
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  This was incidentally noted on carotid ultrasound by Cardiology and report was sent to PCP for management.  Clinic notes also were requested and arrived and placed in Dr. Josefine Class In Box.

## 2018-10-31 NOTE — Telephone Encounter (Signed)
See imaging report.  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  Also faxed for clinic notes as requested by Dr. Damita Dunnings.

## 2018-11-01 NOTE — Telephone Encounter (Signed)
Husband advised.

## 2018-11-01 NOTE — Telephone Encounter (Signed)
Susan [name] says she has seen Endocrinology years ago (maybe 5 years) but doesn't remember the MD name but does remember that it was a female in [place] and says she was sent there by Dr. Damita Dunnings and a biopsy was done that was fine.  Please advise if she needs an appointment with Dr. Damita Dunnings or be referred to Endocrine.  Patient says she was advised to see someone right away but is aware that Dr. Damita Dunnings will not return until 11/07/2018.

## 2018-11-01 NOTE — Telephone Encounter (Signed)
Pt called office requesting a call from Dr.Duncan's nurse about the carotid ultrasound she had. She wants to know if she needs to go to a endocrinologist. Please advise pt. She did state that her voicemail has been set up on her home phone so  Lugene can leave a message.

## 2018-11-01 NOTE — Telephone Encounter (Signed)
I do not have records of recent carotid US in question. However, reasonable to refer back to endo. Previously saw Dr Cruzita Lederer. Referral placed.

## 2018-11-01 NOTE — Addendum Note (Signed)
Addended by: Ria Bush on: 11/01/2018 02:29 PM   Modules accepted: Orders

## 2018-11-06 NOTE — Telephone Encounter (Addendum)
Noted. Thanks. Agreed.  Will await endo consult.

## 2018-11-19 ENCOUNTER — Encounter: Payer: Self-pay | Admitting: Internal Medicine

## 2018-11-19 ENCOUNTER — Ambulatory Visit: Payer: Medicare HMO | Admitting: Internal Medicine

## 2018-11-19 VITALS — BP 140/60 | HR 70 | Ht 61.5 in | Wt 155.0 lb

## 2018-11-19 DIAGNOSIS — E042 Nontoxic multinodular goiter: Secondary | ICD-10-CM | POA: Diagnosis not present

## 2018-11-19 NOTE — Progress Notes (Signed)
Patient ID: Susan [name], female   DOB: 01-04-1946, 73 y.o.   MRN: [id number]    HPI  Susan [name] is a 73 y.o.-year-old female, referred by her PCP, Dr. Damita Dunnings, for evaluation for thyroid nodules.  I saw the patient in 2014 for an initial visit, with last visit 4 years ago.  Reviewed records:  She was diagnosed with hypothyroidism approximately 15 years ago >> she took levothyroxine for 5 years, then she started a homeopathic medicine and her TFTs normalized afterwards.  She did not restart levothyroxine afterwards.  She also remembers having had a thyroid Uptake and scan (ordered by Dr Ronnald Collum). Around that time, she was also told she had nodules and had Bx of the nodules >> benign:  We obtained the records from Dr Ronnald Collum: - pt not seen in his office since 2005: - 07/10/1997: FNA (site not specified): colloid nodule - 12/22/1998: FNA (sorce: left thyroid, increasind size of nodule): colloid nodule - 07/09/2001: FNA (source: left thyroid; 4x4 cm large mass in RLL - ?, increasing in size): hyperplastic nodule  Thyroid U/S (08/04/2013): 2 large thyroid nodules: 3.1 x 2.8 x 3 cm in the upper left lobe, 4.1 x 3.8 x 4.6 cm in the lower left lobe  FNA of the 2 left nodules (09/02/2013): Benign  Recently: Neck CT (10/22/2018): Right lobe of the thyroid surgically absent or severely atrophic, marked enlargement of the left lobe extending into the anterior superior mediastinum consistent with intrathoracic goiter.  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  Pt denies: - feeling nodules in neck, but need to clear her voice - hoarseness - dysphagia - choking - SOB with lying down - but occasionally she feels strangled - seldom, resolving in seconds.  I reviewed pt's thyroid tests: Lab Results  Component Value Date   TSH 3.06 01/25/2018   TSH 2.30 02/26/2017   TSH 4.34 07/13/2016   TSH 2.36 03/18/2015   TSH 2.69 07/09/2014   TSH 2.58 08/18/2013   TSH 3.11 08/27/2012   TSH  2.747 01/12/2012   TSH 2.77 08/05/2010    Pt has: - + fatigue  Denies: - heat intolerance/cold intolerance - tremors - palpitations - anxiety/depression - hyperdefecation/constipation - weight loss/weight gain - dry skin - hair loss  + FH of thyroid ds.: mother - hypothyroidism. No FH of thyroid cancer. No h/o radiation tx to head or neck.  No seaweed or kelp.  No steroid use. No herbal supplements other than CBD oil. No Biotin supplements or Hair, Skin and Nails vitamins. On CBD oil.  Pt also has a history of HTN, HL, IBS, OP.  ROS: Constitutional: + See HPI  Eyes: no blurry vision, no xerophthalmia ENT: no sore throat,  + see HPI Cardiovascular: no CP/SOB/palpitations/leg swelling Respiratory: no cough/SOB Gastrointestinal: no N/V/D/C Musculoskeletal: no muscle/+ joint aches Skin: no rashes Neurological: no tremors/numbness/tingling/dizziness Psychiatric: no depression/+ anxiety  Past Medical History:  Diagnosis Date  . Colon cancer screening   . Diverticulosis of colon   . Hyperlipidemia   . Hypertension   . IBS (irritable bowel syndrome)   . 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 Macular degeneration   . Migraine, unspecified, without mention of intractable migraine without mention of status migrainosus   . NASH (nonalcoholic steatohepatitis)   . Ocular migraine   . Osteoporosis    on Fosamax for 5 years  . Other abnormal glucose   . Other chronic nonalcoholic liver disease    LFTs normalized with weight loss 2013  . Other  screening mammogram   . Symptomatic menopausal or female climacteric states   . Tubular adenoma of colon   . Urinary tract infection, site not specified    Past Surgical History:  Procedure Laterality Date  . abdominal ultrasound  04/12/2004 & 6/06   positive gallstones  . ANKLE FRACTURE SURGERY  ~2002   pinning  . BTL  30+ years ago  . CATARACT EXTRACTION  2013   B eyes  . COLONOSCOPY  06/08/06   Polyps, divertics (Dr. Sharlett Iles)  . CT  abdomen  04/29/04   Positive gallstones  . TONSILLECTOMY AND ADENOIDECTOMY  Age 73  . ultrasound pelvis  6/06   Negative   Social History   Socioeconomic History  . Marital status: Married    Spouse name: Not on file  . Number of children: 2  . Years of education: 74  . Highest education level: Not on file  Occupational History  . 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 Smoking status: Former Smoker    Types: Cigarettes    Last attempt to quit: 03/19/2001    Years since quitting: 17.6  . Smokeless tobacco: Never Used  . Tobacco comment: Quit in 2001  Substance and Sexual Activity  . Alcohol use: No    Alcohol/week: 0.0 standard drinks    Comment: Occasional  . Drug use: No  . Not on file  Social History Narrative   From [place]. Lives with husband, Enjoys times with 5 grandkids, 1 great grandchild   Current Outpatient Medications on File Prior to Visit  Medication Sig Dispense Refill  . acetaminophen (TYLENOL) 500 MG tablet Take 1,000 mg by mouth 2 (two) times daily.    Marland Kitchen aspirin 81 MG tablet Take 81 mg by mouth daily.    . Cholecalciferol 1000 units tablet Take 2 tablets (2,000 Units total) by mouth daily.    Marland Kitchen lisinopril-hydrochlorothiazide (PRINZIDE,ZESTORETIC) 20-25 MG tablet     . meclizine (ANTIVERT) 12.5 MG tablet Take 1 tablet (12.5 mg total) by mouth 3 (three) times daily as needed for dizziness. 12 tablet 0  . metoprolol succinate (TOPROL-XL) 25 MG 24 hr tablet     . rosuvastatin (CRESTOR) 10 MG tablet Take 10 mg by mouth daily.    . vitamin B-12 (CYANOCOBALAMIN) 1000 MCG tablet Take 1,000 mcg by mouth daily.     No current facility-administered medications on file prior to visit.    Allergies  Allergen Reactions  . Lipitor [Atorvastatin Calcium] Other (See Comments)    Aches on med at 85m a day.   .Marland KitchenPenicillins Swelling and Rash    Has patient had a PCN reaction causing immediate rash,  facial/tongue/throat swelling, SOB or lightheadedness with hypotension: Yes Has patient had a PCN reaction causing severe rash involving mucus membranes or skin necrosis: No Has patient had a PCN reaction that required hospitalization No Has patient had a PCN reaction occurring within the last 10 years: No If all of the above answers are "NO", then may proceed with Cephalosporin use.   . Sulfonamide Derivatives Rash   Family History  Problem Relation Age of Onset  . Depression Mother        Manic depression  . Diabetes Mother        Type 2  . Breast cancer Mother   . Colon polyps Father   . Heart disease Father   . Breast cancer Maternal Aunt   . Breast cancer  Paternal Aunt   . Crohn's disease Brother   . Osteoporosis Brother   . Cancer Neg Hx   . Colon cancer Neg Hx     PE: BP 140/60   Pulse 70   Ht 5' 1.5" (1.562 m) Comment: measured  Wt 155 lb (70.3 kg)   SpO2 98%   BMI 28.81 kg/m  Wt Readings from Last 3 Encounters:  11/19/18 155 lb (70.3 kg)  05/27/18 155 lb (70.3 kg)  01/31/18 155 lb 4 oz (70.4 kg)   Constitutional: Slightly overweight, in NAD Eyes: PERRLA, EOMI, no exophthalmos ENT: moist mucous membranes, + left lower thyromegaly, no cervical lymphadenopathy Cardiovascular: RRR, No MRG Respiratory: CTA B Gastrointestinal: abdomen soft, NT, ND, BS+ Musculoskeletal: no deformities, strength intact in all 4;  Skin: moist, warm, no rashes Neurological: no tremor with outstretched hands, DTR normal in all 4  ASSESSMENT: 1. Thyroid nodules  2.  Atrophic right thyroid lobe  3.  Fatigue  PLAN: 1. Thyroid nodules - I reviewed the report of her thyroid ultrasound along with the patient. I pointed out that the dominant nodules are large, this being a risk factor for cancer.  However, these have been biopsied several times with benign results.  Also, Pt does not have a thyroid cancer family history or a personal history of RxTx to head/neck. All these would favor  benignity.  - her recent neck CT scan (performed to evaluate her carotids) showed an enlarged left thyroid lobe extending in the mediastinum, with tracheal displacement.  However, patient does not have significant neck compression symptoms from this.   - We discussed that there are 3 situations in which we have to do surgery for her goiter: Thyroid cancer, neck compression symptoms, cosmetic reasons.  Since as of now, we do not have any evidence of cancer, she does not have neck compression symptoms (except for occasional need to clear her voice) and the nodules are not visible, we can just follow her conservatively for now.  We discussed that performing surgery or RAI treatment to shrink the left lobe will most likely under her thyroid underactive and she would need to be on levothyroxine for the rest of her life.  She agrees with the plan to not intervene right now and appears relieved. -We discussed about neck compression symptoms and I advised her to let me know if she develops them. -Otherwise, I plan to see her back in a year with a thyroid ultrasound checked before the visit  2.  Atrophic right lobe -We discussed that she may have had this from birth.  Alternatively, it may be a consequence of Hashimoto's thyroiditis  3. Fatigue - chronic -She is taking vitamin B12 and D supplements -Upon her questioning, we reviewed her TFTs and I explained that these are not abnormal to necessitate levothyroxine replacement.  Philemon Kingdom, MD PhD [facility] Endocrinology

## 2018-11-19 NOTE — Patient Instructions (Signed)
Please return to see me in 1 year - but contact me 2-3 weeks before the visit so I can order the thyroid U/S.

## 2018-11-28 ENCOUNTER — Encounter: Payer: Self-pay | Admitting: Family Medicine

## 2018-11-28 LAB — LAB REPORT - SCANNED
Cholesterol: 169
Creatinine, Ser: 0.83
FREE T4: 1.11
GLUCOSE: 113
HDL: 45
LDL (calc): 102
SGOT(AST): 40
SGPT (ALT): 33
TRIGLYCERIDES: 112 (ref 40–160)
TSH: 3.94

## 2018-12-11 DIAGNOSIS — H353132 Nonexudative age-related macular degeneration, bilateral, intermediate dry stage: Secondary | ICD-10-CM | POA: Diagnosis not present

## 2018-12-11 DIAGNOSIS — H3561 Retinal hemorrhage, right eye: Secondary | ICD-10-CM | POA: Diagnosis not present

## 2018-12-11 DIAGNOSIS — H43811 Vitreous degeneration, right eye: Secondary | ICD-10-CM | POA: Diagnosis not present

## 2018-12-11 DIAGNOSIS — H353211 Exudative age-related macular degeneration, right eye, with active choroidal neovascularization: Secondary | ICD-10-CM | POA: Diagnosis not present

## 2018-12-19 DIAGNOSIS — Z1231 Encounter for screening mammogram for malignant neoplasm of breast: Secondary | ICD-10-CM | POA: Diagnosis not present

## 2018-12-19 LAB — HM MAMMOGRAPHY

## 2018-12-24 ENCOUNTER — Encounter: Payer: Self-pay | Admitting: Family Medicine

## 2018-12-24 DIAGNOSIS — H524 Presbyopia: Secondary | ICD-10-CM | POA: Diagnosis not present

## 2018-12-24 DIAGNOSIS — Z0100 Encounter for examination of eyes and vision without abnormal findings: Secondary | ICD-10-CM | POA: Diagnosis not present

## 2019-02-04 ENCOUNTER — Ambulatory Visit: Payer: Medicare HMO

## 2019-02-06 ENCOUNTER — Encounter: Payer: Medicare HMO | Admitting: Family Medicine

## 2019-02-11 ENCOUNTER — Encounter: Payer: Medicare HMO | Admitting: Family Medicine

## 2019-04-08 DIAGNOSIS — H3561 Retinal hemorrhage, right eye: Secondary | ICD-10-CM | POA: Diagnosis not present

## 2019-04-08 DIAGNOSIS — H43811 Vitreous degeneration, right eye: Secondary | ICD-10-CM | POA: Diagnosis not present

## 2019-04-08 DIAGNOSIS — H353211 Exudative age-related macular degeneration, right eye, with active choroidal neovascularization: Secondary | ICD-10-CM | POA: Diagnosis not present

## 2019-04-08 DIAGNOSIS — H4051X3 Glaucoma secondary to other eye disorders, right eye, severe stage: Secondary | ICD-10-CM | POA: Diagnosis not present

## 2019-04-08 DIAGNOSIS — H353132 Nonexudative age-related macular degeneration, bilateral, intermediate dry stage: Secondary | ICD-10-CM | POA: Diagnosis not present

## 2019-04-23 ENCOUNTER — Ambulatory Visit (INDEPENDENT_AMBULATORY_CARE_PROVIDER_SITE_OTHER): Payer: Medicare HMO

## 2019-04-23 DIAGNOSIS — Z0000 Encounter for general adult medical examination without abnormal findings: Secondary | ICD-10-CM | POA: Diagnosis not present

## 2019-04-23 NOTE — Progress Notes (Signed)
PCP notes:   Health maintenance:  No gaps identified.  Abnormal screenings:   None   Patient concerns:   None  Nurse concerns:  None  Next PCP appt:   04/11/19 @ 1515  I reviewed health advisor's note, was available for consultation, and agree with documentation and plan. Loura Pardon MD

## 2019-04-23 NOTE — Progress Notes (Signed)
Subjective:   Susan [name] is a 73 y.o. female who presents for a Subsequent Medicare Annual Wellness Visit.  Review of Systems    N/A  Cardiac Risk Factors include: advanced age (>50mn, >>92women);hypertension     Objective:    Today's Vitals   04/23/19 1135 04/23/19 1136  PainSc: 4  4   PainLoc: Generalized Generalized   There is no height or weight on file to calculate BMI.  Advanced Directives 04/23/2019 01/25/2018 07/13/2016  Does Patient Have a Medical Advance Directive? Yes Yes No  Type of [place]amed[place]of [place] will [place] will -  Does patient want to make changes to medical advance directive? No - Patient declined - -  Copy of [place] Chart? No - copy requested No - copy requested -  Would patient like information on creating a medical advance directive? - - Yes - Educational materials given    Current Medications (verified) Outpatient Encounter Medications as of 04/23/2019  Medication Sig  . acetaminophen (TYLENOL) 500 MG tablet Take 1,000 mg by mouth 2 (two) times daily.  .Marland Kitchenaspirin 81 MG tablet Take 81 mg by mouth daily.  . Cholecalciferol 1000 units tablet Take 2 tablets (2,000 Units total) by mouth daily.  .Marland Kitchenlisinopril-hydrochlorothiazide (PRINZIDE,ZESTORETIC) 20-25 MG tablet   . meclizine (ANTIVERT) 12.5 MG tablet Take 1 tablet (12.5 mg total) by mouth 3 (three) times daily as needed for dizziness.  . metoprolol succinate (TOPROL-XL) 25 MG 24 hr tablet   . rosuvastatin (CRESTOR) 10 MG tablet Take 10 mg by mouth daily.  . vitamin B-12 (CYANOCOBALAMIN) 1000 MCG tablet Take 1,000 mcg by mouth daily.   No facility-administered encounter medications on file as of 04/23/2019.     Allergies (verified) Lipitor [atorvastatin calcium], Penicillins, and Sulfonamide derivatives   History: Past Medical History:  Diagnosis Date  . Colon cancer screening   . Diverticulosis of colon    . Hyperlipidemia   . Hypertension   . IBS (irritable bowel syndrome)   . 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 Macular degeneration   . Migraine, unspecified, without mention of intractable migraine without mention of status migrainosus   . NASH (nonalcoholic steatohepatitis)   . Ocular migraine   . Osteoporosis    on Fosamax for 5 years  . Other abnormal glucose   . Other chronic nonalcoholic liver disease    LFTs normalized with weight loss 2013  . Other screening mammogram   . Symptomatic menopausal or female climacteric states   . Tubular adenoma of colon   . Urinary tract infection, site not specified    Past Surgical History:  Procedure Laterality Date  . abdominal ultrasound  04/12/2004 & 6/06   positive gallstones  . ANKLE FRACTURE SURGERY  ~2002   pinning  . BTL  30+ years ago  . CATARACT EXTRACTION  2013   B eyes  . COLONOSCOPY  06/08/06   Polyps, divertics (Dr. PSharlett Iles  . CT abdomen  04/29/04   Positive gallstones  . TONSILLECTOMY AND ADENOIDECTOMY  Age 73 . ultrasound pelvis  6/06   Negative   Family History  Problem Relation Age of Onset  . Depression Mother        Manic depression  . Diabetes Mother        Type 2  . Breast cancer Mother   . Colon polyps Father   . Heart disease Father   . Breast cancer Maternal Aunt   .  Breast cancer Paternal Aunt   . Crohn's disease Brother   . Osteoporosis Brother   . Cancer Neg Hx   . Colon cancer Neg Hx    Social History   Socioeconomic History  . Marital status: Married    Spouse name: Not on file  . Number of children: 2  . Years of education: 22  . Highest education level: Not on file  Occupational History  . 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 Financial resource strain: Not on file  . Food insecurity    Worry: Not on file    Inability: Not on file  . Transportation needs    Medical: Not on file    Non-medical: Not on file  Tobacco Use  . Smoking  status: Former Smoker    Types: Cigarettes    Quit date: 03/19/2001    Years since quitting: 18.1  . Smokeless tobacco: Never Used  . Tobacco comment: Quit in 2001  Substance and Sexual Activity  . Alcohol use: No    Alcohol/week: 0.0 standard drinks    Comment: Occasional  . Drug use: No  . Sexual activity: Not Currently  Lifestyle  . Physical activity    Days per week: Not on file    Minutes per session: Not on file  . Stress: Not on file  Relationships  . Social Herbalist on phone: Not on file    Gets together: Not on file    Attends religious service: Not on file    Active member of club or organization: Not on file    Attends meetings of clubs or organizations: Not on file    Relationship status: Not on file  Other Topics Concern  . Not on file  Social History Narrative   From [place]. Lives with husband, Enjoys times with 5 grandkids, 1 great grandchild    Tobacco Counseling Counseling given: No Comment: Quit in 2001   Clinical Intake:  Pre-visit preparation completed: Yes  Pain Score: 4 (generalized)     Nutritional Status: BMI 25 -29 Overweight Nutritional Risks: None  How often do you need to have someone help you when you read instructions, pamphlets, or other written materials from your doctor or pharmacy?: 1 - Never What is the last grade level you completed in school?: 12th grade  Interpreter Needed?: No  Comments: pt lives with spouse Information entered by :: LPinson, LPN   Activities of Daily Living In your present state of health, do you have any difficulty performing the following activities: 04/23/2019  Hearing? N  Vision? Y  Comment R eye - macular degeneration  Difficulty concentrating or making decisions? N  Walking or climbing stairs? Y  Dressing or bathing? N  Doing errands, shopping? N  Preparing Food and eating ? N  Using the Toilet? N  In the past six months, have you accidently leaked urine? Y  Do you have  problems with loss of bowel control? Y  Managing your Medications? N  Managing your Finances? N  Housekeeping or managing your Housekeeping? N  Some recent data might be hidden     Immunizations and Health Maintenance Immunization History  Administered Date(s) Administered  . Influenza Whole 08/12/2010  . Influenza, Seasonal, Injecte, Preservative Fre 12/13/2012  . Influenza,inj,Quad PF,6+ Mos 08/25/2013, 09/03/2014, 07/13/2016, 12/13/2017  . Pneumococcal Conjugate-13 03/22/2015  . Pneumococcal Polysaccharide-23 08/12/2010  . Td 11/06/1998, 08/12/2010   There are no preventive  care reminders to display for this patient.  Patient Care Team: Tonia Ghent, MD as PCP - General Celesta Gentile, MD as Consulting Physician (Cardiology) Lorelee Cover., MD as Referring Physician (Ophthalmology) Zadie Rhine Clent Demark, MD as Consulting Physician (Ophthalmology)  Indicate any recent Medical Services you may have received from other than [facility] providers in the past year (date may be approximate).     Assessment:   This is a routine wellness examination for Kenlynn.  Hearing/Vision screen  Hearing Screening   125Hz  250Hz  500Hz  1000Hz  2000Hz  3000Hz  4000Hz  6000Hz  8000Hz   Right ear:           Left ear:           Vision Screening Comments: Vision exam in 2020; primary - Dr. Lorie Apley; specialist - Dr. Zadie Rhine  Dietary issues and exercise activities discussed: Current Exercise Habits: The patient does not participate in regular exercise at present, Exercise limited by: None identified  Goals    . Follow up with Primary Care Provider     Starting 04/23/19, I will continue to take medications as prescribed and to keep appointments with PCP as scheduled.       Depression Screen PHQ 2/9 Scores 04/23/2019 01/25/2018 07/13/2016 03/22/2015 08/25/2013  PHQ - 2 Score 0 0 0 0 0  PHQ- 9 Score 0 1 - - -    Fall Risk Fall Risk  04/23/2019 01/25/2018 03/19/2017 07/13/2016 03/22/2015  Falls in the past year? 0  No No No No   Cognitive Function: MMSE - Mini Mental State Exam 04/23/2019 01/25/2018 07/13/2016  Orientation to time 5 5 5   Orientation to Place 5 5 5   Registration 3 3 3   Attention/ Calculation 0 0 0  Recall 3 2 3   Recall-comments - unable to recall 1 of 3 words -  Language- name 2 objects 0 0 0  Language- repeat 1 1 1   Language- follow 3 step command 0 3 3  Language- read & follow direction 0 0 0  Write a sentence 0 0 0  Copy design 0 0 0  Total score 17 19 20        PLEASE NOTE: A Mini-Cog screen was completed. Maximum score is 17. A value of 0 denotes this part of Folstein MMSE was not completed or the patient failed this part of the Mini-Cog screening.   Mini-Cog Screening Orientation to Time - Max 5 pts Orientation to Place - Max 5 pts Registration - Max 3 pts Recall - Max 3 pts Language Repeat - Max 1 pts    Screening Tests Health Maintenance  Topic Date Due  . INFLUENZA VACCINE  06/07/2019  . TETANUS/TDAP  08/12/2020  . MAMMOGRAM  12/19/2020  . COLONOSCOPY  03/22/2021  . DEXA SCAN  Completed  . Hepatitis C Screening  Completed  . PNA vac Low Risk Adult  Completed    Plan:     I have personally reviewed, addressed, and noted the following in the patient's chart:  A. Medical and social history B. Use of alcohol, tobacco or illicit drugs  C. Current medications and supplements D. Functional ability and status E.  Nutritional status F.  Physical activity G. Advance directives H. List of other physicians I.  Hospitalizations, surgeries, and ER visits in previous 12 months J.  Vitals (unless it is a telemedicine encounter) K. Screenings to include cognitive, depression, hearing, vision (NOTE: hearing and vision screenings not completed in telemedicine encounter) L. Referrals and appointments   In addition, I have  reviewed and discussed with patient certain preventive protocols, quality metrics, and best practice recommendations. A written personalized care plan  for preventive services and recommendations were provided to patient.  With patient's permission, we connected on 04/23/19 at 11:30 AM EDT.Interactive audio and video telecommunications were attempted with patient. This attempt was unsuccessful due to patient having technical difficulties OR patient did not have access to video capability.  Encounter was completed with audio only.  Two patient identifiers were used to ensure the encounter occurred with the correct person. Patient was in home and writer was in office.    Signed,   Lindell Noe, MHA, BS, RN Health Coach

## 2019-04-23 NOTE — Patient Instructions (Signed)
Susan [name] , Thank you for taking time to come for your Medicare Wellness Visit. I appreciate your ongoing commitment to your health goals. Please review the following plan we discussed and let me know if I can assist you in the future.   These are the goals we discussed: Goals    . Follow up with Primary Care Provider     Starting 04/23/19, I will continue to take medications as prescribed and to keep appointments with PCP as scheduled.        This is a list of the screening recommended for you and due dates:  Health Maintenance  Topic Date Due  . Flu Shot  06/07/2019  . Tetanus Vaccine  08/12/2020  . Mammogram  12/19/2020  . Colon Cancer Screening  03/22/2021  . DEXA scan (bone density measurement)  Completed  .  Hepatitis C: One time screening is recommended by Center for Disease Control  (CDC) for  adults born from [birth year] through 1966.   Completed  . Pneumonia vaccines  Completed   Preventive Care for Adults  A healthy lifestyle and preventive care can promote health and wellness. Preventive health guidelines for adults include the following key practices.  . A routine yearly physical is a good way to check with your health care provider about your health and preventive screening. It is a chance to share any concerns and updates on your health and to receive a thorough exam.  . Visit your dentist for a routine exam and preventive care every 6 months. Brush your teeth twice a day and floss once a day. Good oral hygiene prevents tooth decay and gum disease.  . The frequency of eye exams is based on your age, health, family medical history, use  of contact lenses, and other factors. Follow your health care provider's recommendations for frequency of eye exams.  . Eat a healthy diet. Foods like vegetables, fruits, whole grains, low-fat dairy products, and lean protein foods contain the nutrients you need without too many calories. Decrease your intake of foods high in solid fats,  added sugars, and salt. Eat the right amount of calories for you. Get information about a proper diet from your health care provider, if necessary.  . Regular physical exercise is one of the most important things you can do for your health. Most adults should get at least 150 minutes of moderate-intensity exercise (any activity that increases your heart rate and causes you to sweat) each week. In addition, most adults need muscle-strengthening exercises on 2 or more days a week.  Silver Sneakers may be a benefit available to you. To determine eligibility, you may visit the website: www.silversneakers.com or contact program at 249-563-3121 Mon-Fri between 8AM-8PM.   . Maintain a healthy weight. The body mass index (BMI) is a screening tool to identify possible weight problems. It provides an estimate of body fat based on height and weight. Your health care provider can find your BMI and can help you achieve or maintain a healthy weight.   For adults 20 years and older: ? A BMI below 18.5 is considered underweight. ? A BMI of 18.5 to 24.9 is normal. ? A BMI of 25 to 29.9 is considered overweight. ? A BMI of 30 and above is considered obese.   . Maintain normal blood lipids and cholesterol levels by exercising and minimizing your intake of saturated fat. Eat a balanced diet with plenty of fruit and vegetables. Blood tests for lipids and cholesterol should begin at age  20 and be repeated every 5 years. If your lipid or cholesterol levels are high, you are over 50, or you are at high risk for heart disease, you may need your cholesterol levels checked more frequently. Ongoing high lipid and cholesterol levels should be treated with medicines if diet and exercise are not working.  . If you smoke, find out from your health care provider how to quit. If you do not use tobacco, please do not start.  . If you choose to drink alcohol, please do not consume more than 2 drinks per day. One drink is  considered to be 12 ounces (355 mL) of beer, 5 ounces (148 mL) of wine, or 1.5 ounces (44 mL) of liquor.  . If you are 19-1 years old, ask your health care provider if you should take aspirin to prevent strokes.  . Use sunscreen. Apply sunscreen liberally and repeatedly throughout the day. You should seek shade when your shadow is shorter than you. Protect yourself by wearing long sleeves, pants, a wide-brimmed hat, and sunglasses year round, whenever you are outdoors.  . Once a month, do a whole body skin exam, using a mirror to look at the skin on your back. Tell your health care provider of new moles, moles that have irregular borders, moles that are larger than a pencil eraser, or moles that have changed in shape or color.

## 2019-04-25 ENCOUNTER — Other Ambulatory Visit: Payer: Self-pay | Admitting: Family Medicine

## 2019-04-25 ENCOUNTER — Other Ambulatory Visit (INDEPENDENT_AMBULATORY_CARE_PROVIDER_SITE_OTHER): Payer: Medicare HMO

## 2019-04-25 DIAGNOSIS — M810 Age-related osteoporosis without current pathological fracture: Secondary | ICD-10-CM | POA: Diagnosis not present

## 2019-04-25 DIAGNOSIS — E538 Deficiency of other specified B group vitamins: Secondary | ICD-10-CM | POA: Diagnosis not present

## 2019-04-25 DIAGNOSIS — I10 Essential (primary) hypertension: Secondary | ICD-10-CM | POA: Diagnosis not present

## 2019-04-25 LAB — LIPID PANEL
Cholesterol: 124 mg/dL (ref 0–200)
HDL: 44.8 mg/dL (ref >39.00)
LDL Cholesterol: 61 mg/dL (ref 0–99)
NonHDL: 78.8
Total CHOL/HDL Ratio: 3
Triglycerides: 89 mg/dL (ref 0.0–149.0)
VLDL: 17.8 mg/dL (ref 0.0–40.0)

## 2019-04-25 LAB — COMPREHENSIVE METABOLIC PANEL
ALT: 44 U/L — ABNORMAL HIGH (ref 0–35)
AST: 47 U/L — ABNORMAL HIGH (ref 0–37)
Albumin: 4.3 g/dL (ref 3.5–5.2)
Alkaline Phosphatase: 40 U/L (ref 39–117)
BUN: 28 mg/dL — ABNORMAL HIGH (ref 6–23)
CO2: 27 mEq/L (ref 19–32)
Calcium: 9.4 mg/dL (ref 8.4–10.5)
Chloride: 103 mEq/L (ref 96–112)
Creatinine, Ser: 0.82 mg/dL (ref 0.40–1.20)
GFR: 68.18 mL/min (ref >60.00)
Glucose, Bld: 117 mg/dL — ABNORMAL HIGH (ref 70–99)
Potassium: 4.1 mEq/L (ref 3.5–5.1)
Sodium: 139 mEq/L (ref 135–145)
Total Bilirubin: 0.5 mg/dL (ref 0.2–1.2)
Total Protein: 6.7 g/dL (ref 6.0–8.3)

## 2019-04-25 LAB — TSH: TSH: 4.07 u[IU]/mL (ref 0.35–4.50)

## 2019-04-25 LAB — VITAMIN D 25 HYDROXY (VIT D DEFICIENCY, FRACTURES): VITD: 41.23 ng/mL (ref 30.00–100.00)

## 2019-04-25 LAB — VITAMIN B12: Vitamin B-12: 646 pg/mL (ref 211–911)

## 2019-05-01 ENCOUNTER — Other Ambulatory Visit: Payer: Self-pay

## 2019-05-01 ENCOUNTER — Ambulatory Visit (INDEPENDENT_AMBULATORY_CARE_PROVIDER_SITE_OTHER): Payer: Medicare HMO | Admitting: Family Medicine

## 2019-05-01 ENCOUNTER — Encounter: Payer: Self-pay | Admitting: Family Medicine

## 2019-05-01 VITALS — BP 112/64 | HR 123 | Temp 98.0°F | Ht 61.5 in | Wt 157.2 lb

## 2019-05-01 DIAGNOSIS — H8110 Benign paroxysmal vertigo, unspecified ear: Secondary | ICD-10-CM

## 2019-05-01 DIAGNOSIS — I10 Essential (primary) hypertension: Secondary | ICD-10-CM

## 2019-05-01 DIAGNOSIS — M810 Age-related osteoporosis without current pathological fracture: Secondary | ICD-10-CM | POA: Diagnosis not present

## 2019-05-01 DIAGNOSIS — R945 Abnormal results of liver function studies: Secondary | ICD-10-CM | POA: Diagnosis not present

## 2019-05-01 DIAGNOSIS — E042 Nontoxic multinodular goiter: Secondary | ICD-10-CM

## 2019-05-01 DIAGNOSIS — E785 Hyperlipidemia, unspecified: Secondary | ICD-10-CM

## 2019-05-01 DIAGNOSIS — E538 Deficiency of other specified B group vitamins: Secondary | ICD-10-CM | POA: Diagnosis not present

## 2019-05-01 DIAGNOSIS — Z7189 Other specified counseling: Secondary | ICD-10-CM

## 2019-05-01 DIAGNOSIS — Z0000 Encounter for general adult medical examination without abnormal findings: Secondary | ICD-10-CM

## 2019-05-01 DIAGNOSIS — R7989 Other specified abnormal findings of blood chemistry: Secondary | ICD-10-CM

## 2019-05-01 NOTE — Patient Instructions (Addendum)
Stop crestor for 1 week and see if the aches clearly get better.   If you have persistent pulse elevation above 100, then increase the metoprolol to 2 tabs a day and update cardiology.  Update me as needed.  Take care.  Glad to see you.

## 2019-05-01 NOTE — Progress Notes (Signed)
HTN.  Elevated pulse initially at OV.  Prev 80-90s.  Recheck 90.  She has seen Dr. Humphrey Rolls with cardiology, most recently about 6 months ago.  She is still on metoprolol.  Some mildly elevated heart rates when walking.  She has some fatigue with exertion.  No CP.  Not SOB o/w.  No BLE edema.    Pandemic considerations d/w pt.  She had been really anxious episodically and used CBD oil with relief.    Mild LFT elevation d/w pt. steatohepatitis prev noted on bx.   Discussed with patient.  She has seen endocrine in the meantime about her thyroid.  I will defer.  She agrees.  B12 def.  On replacement.  Labs d/w pt.   Elevated Cholesterol: Using medications without problems:yes Muscle aches: some occ aches in the ext but tolerable.   Diet compliance: encouraged healthy diet, d/w pt.  "I'm good all day long and then after dinner I start eating sweets."  Exercise: mainly chores, housework.   She has occ vertigo that clearly responds to meclizine.    Vaccines d/w pt.  shingrix is back ordered.   Colonoscopy 2012 Breast cancer screening- done 2020 Advance directive- husband and oldest daughter Maudie Mercury equally designated if patient were incapacitated.   Osteoporosis hx.  Taking vit D at baseline.  S/p 5 years of fosamax.  Defer DXA at this point.  She agrees.  She did not want further treatment.  PMH and SH reviewed  ROS: Per HPI unless specifically indicated in ROS section   Meds, vitals, and allergies reviewed.   GEN: nad, alert and oriented HEENT: ncat NECK: supple w/o LA CV: rrr. PULM: ctab, no inc wob ABD: soft, +bs EXT: no edema SKIN: no acute rash

## 2019-05-01 NOTE — Assessment & Plan Note (Signed)
Advance directive- husband and oldest daughter Maudie Mercury equally designated if patient were incapacitated.

## 2019-05-04 NOTE — Assessment & Plan Note (Signed)
Vaccines d/w pt.  shingrix is back ordered.   Colonoscopy 2012 Breast cancer screening- done 2020 Advance directive- husband and oldest daughter Maudie Mercury equally designated if patient were incapacitated.   Osteoporosis hx.  Taking vit D at baseline.  S/p 5 years of fosamax.  Defer DXA at this point.  She agrees.  She did not want further treatment.

## 2019-05-04 NOTE — Assessment & Plan Note (Signed)
She has seen endocrine in the meantime about her thyroid.  I will defer.  She agrees.

## 2019-05-04 NOTE — Assessment & Plan Note (Signed)
She has occ vertigo that clearly responds to meclizine.   Continue as is.

## 2019-05-04 NOTE — Assessment & Plan Note (Signed)
B12 def.  On replacement.  Labs d/w pt. continue as is.  B12 normal.

## 2019-05-04 NOTE — Assessment & Plan Note (Addendum)
If persistent pulse elevation above 100, then increase the metoprolol to 2 tabs a day and update cardiology.  See after visit summary.  Her heart rate was regular on exam.

## 2019-05-04 NOTE — Assessment & Plan Note (Signed)
Mild LFT elevation d/w pt. steatohepatitis prev noted on bx.   Discussed with patient about diet and exercise.

## 2019-05-04 NOTE — Assessment & Plan Note (Signed)
Osteoporosis hx.  Taking vit D at baseline.  S/p 5 years of fosamax.  Defer DXA at this point.  She agrees.  She did not want further treatment.

## 2019-05-04 NOTE — Assessment & Plan Note (Addendum)
Continue work on diet and exercise.  See above.  Stop statin for 1 week and see if aches clearly get better.  Update me as needed. >25 minutes spent in face to face time with patient, >50% spent in counselling or coordination of care.

## 2019-08-19 ENCOUNTER — Ambulatory Visit (INDEPENDENT_AMBULATORY_CARE_PROVIDER_SITE_OTHER): Payer: Medicare HMO | Admitting: Family Medicine

## 2019-08-19 ENCOUNTER — Ambulatory Visit (INDEPENDENT_AMBULATORY_CARE_PROVIDER_SITE_OTHER)
Admission: RE | Admit: 2019-08-19 | Discharge: 2019-08-19 | Disposition: A | Discharge status: Home / Self Care | Payer: Medicare HMO | Source: Ambulatory Visit | Attending: Family Medicine | Admitting: Family Medicine

## 2019-08-19 ENCOUNTER — Other Ambulatory Visit: Payer: Self-pay

## 2019-08-19 ENCOUNTER — Encounter: Payer: Self-pay | Admitting: Family Medicine

## 2019-08-19 VITALS — BP 112/68 | HR 85 | Temp 97.6°F | Wt 154.0 lb

## 2019-08-19 DIAGNOSIS — M25511 Pain in right shoulder: Secondary | ICD-10-CM

## 2019-08-19 DIAGNOSIS — M19011 Primary osteoarthritis, right shoulder: Secondary | ICD-10-CM | POA: Diagnosis not present

## 2019-08-19 DIAGNOSIS — E785 Hyperlipidemia, unspecified: Secondary | ICD-10-CM

## 2019-08-19 NOTE — Patient Instructions (Signed)
Go to the lab on the way out.  We'll contact you with your xray report. Use the shoulder exercises and update me as needed.  Take care.  Glad to see you.

## 2019-08-19 NOTE — Progress Notes (Signed)
She is off crestor, has been off for about 3 weeks.  No change or improvement off med.  She wanted a second opinion from cardiology.  Discussed.  R arm pain. Upper lateral arm pain.  R shoulder pain.  No trauma.  Tried OTC meds, heat, etc.  Going on for about 2 weeks.  Walking from pain at night.  Normal grip. No L sided sx.    Meds, vitals, and allergies reviewed.   ROS: Per HPI unless specifically indicated in ROS section   nad ncat Neck supple.  No lymphadenopathy.  Normal range of motion. Right shoulder without pain at the [facility] joint but she does have pain with int > ext rotation.  No arm drop.  Distally neurovascularly intact.  Positive impingement.

## 2019-08-20 DIAGNOSIS — M25511 Pain in right shoulder: Secondary | ICD-10-CM | POA: Insufficient documentation

## 2019-08-20 NOTE — Assessment & Plan Note (Signed)
Hold statin for now, restart after her shoulder pain is better.  Discussed.  She agrees.  See shoulder discussion.

## 2019-08-20 NOTE — Assessment & Plan Note (Signed)
Likely rotator cuff symptoms.  See notes on imaging.  Home exercise program discussed with patient.  Handout given and explained.  She can start with home exercise program and update me if not better.  She agrees.

## 2019-09-03 ENCOUNTER — Other Ambulatory Visit: Payer: Self-pay

## 2019-09-03 ENCOUNTER — Encounter: Payer: Self-pay | Admitting: Internal Medicine

## 2019-09-03 ENCOUNTER — Ambulatory Visit (INDEPENDENT_AMBULATORY_CARE_PROVIDER_SITE_OTHER): Payer: Medicare HMO | Admitting: Internal Medicine

## 2019-09-03 VITALS — BP 122/72 | HR 76 | Ht 61.0 in | Wt 156.0 lb

## 2019-09-03 DIAGNOSIS — E785 Hyperlipidemia, unspecified: Secondary | ICD-10-CM | POA: Diagnosis not present

## 2019-09-03 DIAGNOSIS — I10 Essential (primary) hypertension: Secondary | ICD-10-CM

## 2019-09-03 DIAGNOSIS — I6523 Occlusion and stenosis of bilateral carotid arteries: Secondary | ICD-10-CM

## 2019-09-03 DIAGNOSIS — I2510 Atherosclerotic heart disease of native coronary artery without angina pectoris: Secondary | ICD-10-CM | POA: Diagnosis not present

## 2019-09-03 NOTE — Progress Notes (Signed)
New Outpatient Visit Date: 09/03/2019  Referring Provider: Tonia Ghent, MD [address] [place],   [address]  Chief Complaint: Elevated cholesterol  HPI:  Susan [name] is a 73 y.o. female who is being seen today for the evaluation of hyperlipidemia at the request of Dr. Damita Dunnings. She has a history of valvular heart disease, hypertension, hyperlipidemia, thyroid disease, NASH, and IBS.  Today, Susan [name] reports that she is feeling well.  She reports an episode of acute onset of shortness of breath associated with elevated blood pressure in 2016.  She was seen in the ED and referred to Dr. Humphrey Rolls for follow-up.  He performed echo, cardiac CTA, and carotid Dopplers, as outlined below.  Pertinent findings include CAC score of 74 and mild proximal LAD disease with haziness.  She was placed on statin therapy but has experienced considerable myalgias with both atorvastatin and rosuvastatin.  She was most recently on rosuvastatin until about 2-3 weeks ago.  Since stopping the medication, she has felt much better.  Susan [name] denies chest pain, palpitations, orthopnea, PND, and edema.  She has stable exertional dyspnea with strenuous activities.  She notes that her blood pressure and heart rate dropped significantly while on metoprolol.  --------------------------------------------------------------------------------------------------  Cardiovascular History & Procedures: Cardiovascular Problems:  Nonobstructive coronary artery disease  Mitral and tricuspid regurgitation  Risk Factors:  CAD, hypertension, hyperlipidemia, and age > 48  Cath/PCI:  None  CV Surgery:  None  EP Procedures and Devices:  None  Non-Invasive Evaluation(s):  TTE (10/16/2018, Dr. Humphrey Rolls): Normal LV size with mild LVH.  LVEF 70-75%.  Mild to moderate mitral and moderate to severe tricuspid regurgitation.  Mild to moderate pulmonary hypertension.  RV size and function.  Pericardial effusion.   Carotid Doppler (10/16/2018, Dr. Humphrey Rolls): Protruding plaque in both carotid bulbs without obstruction.  Increased in right mid ICA felt to be due to tortuosity.  Cardiac CTA (09/14/15, Dr. Humphrey Rolls): CAC score 74.  Mild proximal LAD disease with haziness.  LCx and RCA are normal  Recent CV Pertinent Labs: Lab Results  Component Value Date   CHOL 124 04/25/2019   CHOL 169 10/17/2018   HDL 44.80 04/25/2019   LDLCALC 61 04/25/2019   LDLCALC 102 10/17/2018   TRIG 89.0 04/25/2019   TRIG 112 10/17/2018   CHOLHDL 3 04/25/2019   K 4.1 04/25/2019   BUN 28 (H) 04/25/2019   CREATININE 0.82 04/25/2019   CREATININE 0.83 10/17/2018   CREATININE 0.77 01/12/2012    --------------------------------------------------------------------------------------------------  Past Medical History:  Diagnosis Date  . Colon cancer screening   . Diverticulosis of colon   . Hyperlipidemia   . Hypertension   . IBS (irritable bowel syndrome)   . 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 Macular degeneration   . Migraine, unspecified, without mention of intractable migraine without mention of status migrainosus   . NASH (nonalcoholic steatohepatitis)   . Nonobstructive atherosclerosis of coronary artery 2016   Cardiac CTA with mild proximal LAD disease; CAC score 74.  Marland Kitchen Ocular migraine   . Osteoporosis    on Fosamax for 5 years  . Other abnormal glucose   . Other chronic nonalcoholic liver disease    LFTs normalized with weight loss 2013  . Other screening mammogram   . Symptomatic menopausal or female climacteric states   . Thyroid disease   . Tubular adenoma of colon   . Urinary tract infection, site not specified     Past Surgical History:  Procedure Laterality Date  .  abdominal ultrasound  04/12/2004 & 6/06   positive gallstones  . ANKLE FRACTURE SURGERY  ~2002   pinning  . BTL  30+ years ago  . CATARACT EXTRACTION  2013   B eyes  . COLONOSCOPY  06/08/06   Polyps, divertics (Dr. Sharlett Iles)  . CT abdomen   04/29/04   Positive gallstones  . TONSILLECTOMY AND ADENOIDECTOMY  Age 54  . ultrasound pelvis  6/06   Negative    Current Meds  Medication Sig  . acetaminophen (TYLENOL) 500 MG tablet Take 1,000 mg by mouth 2 (two) times daily.  Marland Kitchen aspirin 81 MG tablet Take 81 mg by mouth daily.  . Cholecalciferol 1000 units tablet Take 2 tablets (2,000 Units total) by mouth daily.  Marland Kitchen lisinopril-hydrochlorothiazide (PRINZIDE,ZESTORETIC) 20-25 MG tablet   . meclizine (ANTIVERT) 12.5 MG tablet Take 1 tablet (12.5 mg total) by mouth 3 (three) times daily as needed for dizziness.  . vitamin B-12 (CYANOCOBALAMIN) 1000 MCG tablet Take 1,000 mcg by mouth daily.    Allergies: Lipitor [atorvastatin calcium], Penicillins, and Sulfonamide derivatives  Social History   Tobacco Use  . Smoking status: Former Smoker    Types: Cigarettes    Quit date: 03/19/2001    Years since quitting: 18.4  . Smokeless tobacco: Never Used  . Tobacco comment: Quit in 2001  Substance Use Topics  . Alcohol use: Not Currently    Comment: 1 glass of wine per month  . Drug use: No    Comment: Uses CBD oil to help calm down    Family History  Problem Relation Age of Onset  . Depression Mother        Manic depression  . Diabetes Mother        Type 2  . Breast cancer Mother   . Heart disease Mother   . Colon polyps Father   . Heart attack Father 35  . Breast cancer Maternal Aunt   . Breast cancer Paternal Aunt   . Crohn's disease Brother   . Osteoporosis Brother   . Cancer Neg Hx   . Colon cancer Neg Hx     Review of Systems: A 12-system review of systems was performed and was negative except as noted in the HPI.  --------------------------------------------------------------------------------------------------  Physical Exam: BP 122/72 (BP Location: Right Arm, Patient Position: Sitting, Cuff Size: Normal)   Pulse 76   Ht 5' 1"  (1.549 m)   Wt 156 lb (70.8 kg)   SpO2 98%   BMI 29.48 kg/m   General:  NAD.  HEENT: No conjunctival pallor or scleral icterus.  Neck: Supple without lymphadenopathy, thyromegaly, JVD, or HJR. No carotid bruit. Lungs: Normal work of breathing. Clear to auscultation bilaterally without wheezes or crackles. Heart: Regular rate and rhythm with 1/6 systolic murmur.  No rubs or gallops. Abd: Bowel sounds present. Soft, NT/ND without hepatosplenomegaly Ext: No lower extremity edema. Radial, PT, and DP pulses are 2+ bilaterally Skin: Warm and dry without rash. Neuro: CNIII-XII intact. Strength and fine-touch sensation intact in upper and lower extremities bilaterally. Psych: Normal mood and affect.  EKG:  NSR with incomplete RBBB.  Lab Results  Component Value Date   WBC 4.8 02/26/2017   HGB 14.0 02/26/2017   HCT 42.0 02/26/2017   MCV 91.5 02/26/2017   PLT 188.0 02/26/2017    Lab Results  Component Value Date   NA 139 04/25/2019   K 4.1 04/25/2019   CL 103 04/25/2019   CO2 27 04/25/2019   BUN 28 (H) 04/25/2019  CREATININE 0.82 04/25/2019   GLUCOSE 117 (H) 04/25/2019   ALT 44 (H) 04/25/2019    Lab Results  Component Value Date   CHOL 124 04/25/2019   HDL 44.80 04/25/2019   LDLCALC 61 04/25/2019   TRIG 89.0 04/25/2019   CHOLHDL 3 04/25/2019     --------------------------------------------------------------------------------------------------  ASSESSMENT AND PLAN: Coronary artery disease: No angina reported.  Mild disease noted in the proximal LAD by cardiac CTA in 2016.  I recommend continued medical therapy and risk factor modification to prevent progression of disease.  We will assess her lipids in ~2 months, given that she recently stopped rosuvastatin due to myalgias.  Susan [name] should remain on aspirin 81 mg daily.  Carotid artery disease: Mild plaquing noted on Doppler study in 10/2018.  Continue medical therapy.  Hyperlipidemia: Susan [name] has been intolerant of atorvastatin and rosuvastatin.  Given evidence of ASCVD involving the  coronary and carotid beds, I recommend LDL goal < 70.  We will obtain a lipid panel and ALT in ~2 months to allow for adequate washout of statin effects.  If LDL is above goal, we will need to consider addition of ezetimibe versus PCSK9 inhibitor.  In the meantime, I have encouraged Susan [name] to work on lifestyle modifications.  Hypertension: BP well-controlled.  No medication changes at this time.  Follow-up: Return to clinic in 2-3 months.  Nelva Bush, MD 09/05/2019 2:52 PM

## 2019-09-03 NOTE — Patient Instructions (Signed)
Medication Instructions:  Your physician recommends that you continue on your current medications as directed. Please refer to the Current Medication list given to you today.  *If you need a refill on your cardiac medications before your next appointment, please call your pharmacy*  Lab Work: Your physician recommends that you return for lab work in: about [place], [place] OF December. - FOR lipid AND ALT.  - You will need to be fasting. Please do not have anything to eat or drink after midnight the morning you have the lab work. You may only have water or black coffee with no cream or sugar. - Please go to the [facility]. You will check in at the front desk to the right as you walk into the atrium. Valet Parking is offered if needed. - No appointment needed. You may go any day between 7 am and 6 pm.  If you have labs (blood work) drawn today and your tests are completely normal, you will receive your results only by: Marland Kitchen MyChart Message (if you have MyChart) OR . A paper copy in the mail If you have any lab test that is abnormal or we need to change your treatment, we will call you to review the results.  Testing/Procedures: NONE  Follow-Up: At [facility], you and your health needs are our priority.  As part of our continuing mission to provide you with exceptional heart care, we have created designated Provider Care Teams.  These Care Teams include your primary Cardiologist (physician) and Advanced Practice Providers (APPs -  Physician Assistants and Nurse Practitioners) who all work together to provide you with the care you need, when you need it.  Your next appointment:   2-3 months  (make sure you have had your lab work sometime before the appointment.)  The format for your next appointment:   In Person  Provider:    You may see DR Harrell Gave END or one of the following Advanced Practice Providers on your designated Care Team:    Murray Hodgkins, NP  Christell Faith, PA-C  Marrianne Mood, PA-C

## 2019-09-05 ENCOUNTER — Encounter: Payer: Self-pay | Admitting: Internal Medicine

## 2019-09-05 DIAGNOSIS — I2510 Atherosclerotic heart disease of native coronary artery without angina pectoris: Secondary | ICD-10-CM | POA: Insufficient documentation

## 2019-09-05 DIAGNOSIS — I25119 Atherosclerotic heart disease of native coronary artery with unspecified angina pectoris: Secondary | ICD-10-CM | POA: Insufficient documentation

## 2019-09-05 DIAGNOSIS — I6523 Occlusion and stenosis of bilateral carotid arteries: Secondary | ICD-10-CM | POA: Insufficient documentation

## 2019-09-09 DIAGNOSIS — H43811 Vitreous degeneration, right eye: Secondary | ICD-10-CM | POA: Diagnosis not present

## 2019-09-09 DIAGNOSIS — Z961 Presence of intraocular lens: Secondary | ICD-10-CM | POA: Diagnosis not present

## 2019-09-09 DIAGNOSIS — H353212 Exudative age-related macular degeneration, right eye, with inactive choroidal neovascularization: Secondary | ICD-10-CM | POA: Diagnosis not present

## 2019-09-09 DIAGNOSIS — H353132 Nonexudative age-related macular degeneration, bilateral, intermediate dry stage: Secondary | ICD-10-CM | POA: Diagnosis not present

## 2019-10-29 ENCOUNTER — Other Ambulatory Visit
Admission: RE | Admit: 2019-10-29 | Discharge: 2019-10-29 | Disposition: A | Discharge status: Home / Self Care | Payer: Medicare HMO | Source: Ambulatory Visit | Attending: Internal Medicine | Admitting: Internal Medicine

## 2019-10-29 DIAGNOSIS — E785 Hyperlipidemia, unspecified: Secondary | ICD-10-CM | POA: Insufficient documentation

## 2019-10-29 DIAGNOSIS — I10 Essential (primary) hypertension: Secondary | ICD-10-CM | POA: Diagnosis not present

## 2019-10-29 LAB — LIPID PANEL
Cholesterol: 183 mg/dL (ref 0–200)
HDL: 47 mg/dL (ref >40)
LDL Cholesterol: 114 mg/dL — ABNORMAL HIGH (ref 0–99)
Total CHOL/HDL Ratio: 3.9 RATIO
Triglycerides: 112 mg/dL (ref <150)
VLDL: 22 mg/dL (ref 0–40)

## 2019-10-29 LAB — ALT: ALT: 30 U/L (ref 0–44)

## 2019-10-30 ENCOUNTER — Other Ambulatory Visit: Payer: Self-pay

## 2019-10-30 DIAGNOSIS — Z79899 Other long term (current) drug therapy: Secondary | ICD-10-CM

## 2019-10-30 MED ORDER — EZETIMIBE 10 MG PO TABS: 10.0000 mg | ORAL_TABLET | Freq: Every day | ORAL | 4 refills | Status: DC

## 2019-11-05 ENCOUNTER — Ambulatory Visit: Payer: Medicare HMO | Admitting: Internal Medicine

## 2019-11-06 ENCOUNTER — Telehealth: Payer: Self-pay | Admitting: Internal Medicine

## 2019-11-06 NOTE — Telephone Encounter (Signed)
Pt calling a few weeks before her appt as requested so an order for a thyroid US can be placed.

## 2019-11-10 ENCOUNTER — Other Ambulatory Visit: Payer: Self-pay | Admitting: Internal Medicine

## 2019-11-10 DIAGNOSIS — E042 Nontoxic multinodular goiter: Secondary | ICD-10-CM

## 2019-11-10 DIAGNOSIS — Z20828 Contact with and (suspected) exposure to other viral communicable diseases: Secondary | ICD-10-CM | POA: Diagnosis not present

## 2019-11-10 NOTE — Telephone Encounter (Signed)
Done

## 2019-11-11 ENCOUNTER — Telehealth: Payer: Self-pay

## 2019-11-11 NOTE — Telephone Encounter (Signed)
Pt saw her daughter from [place] for 2 days at Christmas; no wearing mask; on 11/09/18 pts daughter tested positive for covid; a grandaughter that lives with pt tested positive on 11/10/18. Pt is waiting on test results; pt has prod cough with clear phlegm, runny nose and no other covid symptoms. Pt wants to know if she test negative for covid should she get the covid vaccine. I am not sure of eligibility since 1 b is 75 years and older. Pt request Dr Josefine Class advice if pt should get the vaccine, when and where should she get the vaccine.pt request cb.

## 2019-11-11 NOTE — Telephone Encounter (Signed)
I wouldn't get vaccine while ill, from either covid or another illness.  I would have her check on vaccine availability when her illness is resolved.   Even if covid neg, I would presume it to be a likely/possible false neg given her exposure and symptoms.   Supportive tx in the meantime, please update [place] as needed about sx/condition.   Thanks.

## 2019-11-11 NOTE — Telephone Encounter (Signed)
Patient advised.

## 2019-11-25 ENCOUNTER — Ambulatory Visit: Payer: Medicare HMO | Admitting: Internal Medicine

## 2019-12-01 DIAGNOSIS — H353131 Nonexudative age-related macular degeneration, bilateral, early dry stage: Secondary | ICD-10-CM | POA: Diagnosis not present

## 2019-12-04 DIAGNOSIS — H353221 Exudative age-related macular degeneration, left eye, with active choroidal neovascularization: Secondary | ICD-10-CM | POA: Diagnosis not present

## 2019-12-04 DIAGNOSIS — H353212 Exudative age-related macular degeneration, right eye, with inactive choroidal neovascularization: Secondary | ICD-10-CM | POA: Diagnosis not present

## 2019-12-04 DIAGNOSIS — H4051X3 Glaucoma secondary to other eye disorders, right eye, severe stage: Secondary | ICD-10-CM | POA: Diagnosis not present

## 2019-12-04 DIAGNOSIS — H353132 Nonexudative age-related macular degeneration, bilateral, intermediate dry stage: Secondary | ICD-10-CM | POA: Diagnosis not present

## 2019-12-10 ENCOUNTER — Ambulatory Visit: Payer: Medicare HMO | Admitting: Internal Medicine

## 2019-12-10 ENCOUNTER — Other Ambulatory Visit: Payer: Self-pay

## 2019-12-10 ENCOUNTER — Encounter: Payer: Self-pay | Admitting: Internal Medicine

## 2019-12-10 VITALS — BP 130/54 | HR 96 | Ht 61.0 in | Wt 151.2 lb

## 2019-12-10 DIAGNOSIS — E785 Hyperlipidemia, unspecified: Secondary | ICD-10-CM | POA: Diagnosis not present

## 2019-12-10 DIAGNOSIS — I340 Nonrheumatic mitral (valve) insufficiency: Secondary | ICD-10-CM | POA: Diagnosis not present

## 2019-12-10 DIAGNOSIS — I10 Essential (primary) hypertension: Secondary | ICD-10-CM | POA: Diagnosis not present

## 2019-12-10 DIAGNOSIS — I351 Nonrheumatic aortic (valve) insufficiency: Secondary | ICD-10-CM

## 2019-12-10 DIAGNOSIS — I6523 Occlusion and stenosis of bilateral carotid arteries: Secondary | ICD-10-CM | POA: Diagnosis not present

## 2019-12-10 DIAGNOSIS — I2510 Atherosclerotic heart disease of native coronary artery without angina pectoris: Secondary | ICD-10-CM | POA: Diagnosis not present

## 2019-12-10 NOTE — Progress Notes (Signed)
Follow-up Outpatient Visit Date: 12/10/2019  Primary Care Provider: Tonia Ghent, MD [address] [place] [address]  Chief Complaint: Follow-up valvular heart disease  HPI:  Susan [name] is a 74 y.o. female with history of aortic, mitral, and tricuspid regurgitation, hypertension, hyperlipidemia, thyroid disease, NASH, and IBS, who presents for follow-up of hyperlipidemia and valvular heart disease.  I met Susan [name] in 08/2019, at which time she was feeling well.  She reported an episode of acute shortness of breath with elevated blood pressure in 2016 leading to ED evaluation and subsequent follow-up with Dr. Humphrey Rolls.  Today, Susan [name] reports that she is feeling well.  She contracted COVID-19 in early January but was asymptomatic.  She notes rare orthostatic lightheadedness but otherwise has not had any chest pain, shortness of breath, palpitations, or edema.  Home blood pressures typically around 600 mmHg systolic.  She is tolerating addition of ezetimibe well after lipid panel in 10/2019 showed LDL above goal.  She is trying to exercise with a stationary bike at least 30 minutes a day.  --------------------------------------------------------------------------------------------------  Cardiovascular History & Procedures: Cardiovascular Problems:  Nonobstructive coronary artery disease  Mitral and tricuspid regurgitation  Risk Factors:  CAD, hypertension, hyperlipidemia, and age > 58  Cath/PCI:  None  CV Surgery:  None  EP Procedures and Devices:  None  Non-Invasive Evaluation(s):  TTE (10/16/2018, Dr. Humphrey Rolls): Normal LV size with mild LVH.  LVEF 70-75%.  Mild to moderate mitral and moderate to severe tricuspid regurgitation.  Moderate aortic regurgitation. Mild to moderate pulmonary hypertension.  RV size and function.    Trivial pericardial effusion.  Carotid Doppler (10/16/2018, Dr. Humphrey Rolls): Protruding plaque in both carotid bulbs without  obstruction.  Increased in right mid ICA felt to be due to tortuosity.  Cardiac CTA (09/14/15, Dr. Humphrey Rolls): CAC score 74.  Mild proximal LAD disease with haziness.  LCx and RCA are normal  Recent CV Pertinent Labs: Lab Results  Component Value Date   CHOL 183 10/29/2019   CHOL 169 10/17/2018   HDL 47 10/29/2019   LDLCALC 114 (H) 10/29/2019   LDLCALC 102 10/17/2018   TRIG 112 10/29/2019   TRIG 112 10/17/2018   CHOLHDL 3.9 10/29/2019   K 4.1 04/25/2019   BUN 28 (H) 04/25/2019   CREATININE 0.82 04/25/2019   CREATININE 0.83 10/17/2018   CREATININE 0.77 01/12/2012    Past medical and surgical history were reviewed and updated in EPIC.  Current Meds  Medication Sig  . acetaminophen (TYLENOL) 500 MG tablet Take 1,000 mg by mouth 2 (two) times daily.  Marland Kitchen aspirin 81 MG tablet Take 81 mg by mouth daily.  . Cholecalciferol 1000 units tablet Take 2 tablets (2,000 Units total) by mouth daily.  Marland Kitchen ezetimibe (ZETIA) 10 MG tablet Take 1 tablet (10 mg total) by mouth daily.  Marland Kitchen lisinopril-hydrochlorothiazide (PRINZIDE,ZESTORETIC) 20-25 MG tablet   . meclizine (ANTIVERT) 12.5 MG tablet Take 1 tablet (12.5 mg total) by mouth 3 (three) times daily as needed for dizziness.  . multivitamin-lutein (OCUVITE-LUTEIN) CAPS capsule Take 1 capsule by mouth daily. Taking 1 tablet daily  . vitamin B-12 (CYANOCOBALAMIN) 1000 MCG tablet Take 1,000 mcg by mouth daily.    Allergies: Lipitor [atorvastatin calcium], Penicillins, and Sulfonamide derivatives  Social History   Tobacco Use  . Smoking status: Former Smoker    Types: Cigarettes    Quit date: 03/19/2001    Years since quitting: 18.7  . Smokeless tobacco: Never Used  . Tobacco comment: Quit  in 2001  Substance Use Topics  . Alcohol use: Not Currently    Comment: 1 glass of wine per month  . Drug use: No    Comment: Uses CBD oil to help calm down    Family History  Problem Relation Age of Onset  . Depression Mother        Manic depression  .  Diabetes Mother        Type 2  . Breast cancer Mother   . Heart disease Mother   . Colon polyps Father   . Heart attack Father 87  . Breast cancer Maternal Aunt   . Breast cancer Paternal Aunt   . Crohn's disease Brother   . Osteoporosis Brother   . Cancer Neg Hx   . Colon cancer Neg Hx     Review of Systems: A 12-system review of systems was performed and was negative except as noted in the HPI.  --------------------------------------------------------------------------------------------------  Physical Exam: BP (!) 130/54 (BP Location: Left Arm, Patient Position: Sitting, Cuff Size: Normal)   Pulse 96   Ht 5' 1"  (1.549 m)   Wt 151 lb 4 oz (68.6 kg)   SpO2 97%   BMI 28.58 kg/m   General: NAD. HEENT: No conjunctival pallor or scleral icterus. Facemask in place. Neck: No JVD or HJR. Lungs: Normal work of breathing. Clear to auscultation bilaterally without wheezes or crackles. Heart: Regular rate and rhythm with 1/6 systolic and early diastolic murmurs.  No rubs or gallops. Abd: Bowel sounds present. Soft, NT/ND without hepatosplenomegaly Ext: No lower extremity edema. Radial, PT, and DP pulses are 2+ bilaterally.  EKG: Normal sinus rhythm with incomplete right bundle branch block and nonspecific ST segment changes.  Heart rate has increased slightly since 09/03/2019.  Otherwise, there has been no significant interval change.  Lab Results  Component Value Date   WBC 4.8 02/26/2017   HGB 14.0 02/26/2017   HCT 42.0 02/26/2017   MCV 91.5 02/26/2017   PLT 188.0 02/26/2017    Lab Results  Component Value Date   NA 139 04/25/2019   K 4.1 04/25/2019   CL 103 04/25/2019   CO2 27 04/25/2019   BUN 28 (H) 04/25/2019   CREATININE 0.82 04/25/2019   GLUCOSE 117 (H) 04/25/2019   ALT 30 10/29/2019    Lab Results  Component Value Date   CHOL 183 10/29/2019   HDL 47 10/29/2019   LDLCALC 114 (H) 10/29/2019   TRIG 112 10/29/2019   CHOLHDL 3.9 10/29/2019     --------------------------------------------------------------------------------------------------  ASSESSMENT AND PLAN: Valvular heart disease: Mixed regurgitant disease involving aortic, mitral, and tricuspid valves were previously noted, most recently on outside echo in 2019.  Susan [name] does not have any signs or symptoms of heart failure.  We will continue current medications for blood pressure control.  We will plan to repeat an echo shortly before her follow-up visit with me in 6 months.  Coronary artery disease: Susan [name] has not had any angina.  Cardiac CTA in 2016 made note of mild disease in the proximal LAD.  Continue with medical therapy and risk factor modification, including recently added ezetimibe.  Carotid artery stenosis: Mild plaquing noted on carotid Doppler study in 10/2018.  Continue risk factor modification and medical therapy to prevent progression of disease.  Hypertension: Blood pressure upper normal today, particularly on the systolic side.  We will continue current medications.  Sodium restriction was reinforced.  Hyperlipidemia: Lipid panel on 10/29/2019 notable for LDL of 114.  Given  evidence of coronary and carotid atherosclerosis, we would like to target LDL less than 70, if possible.  Susan [name] is tolerating ezetimibe 10 mg daily well, as she has previously been intolerant of statins.  LDL does not improve significantly, we will need to consider adding a PCSK9 inhibitor.  We will plan to recheck a lipid panel and ALT in late 01/2020.  Follow-up: Return to clinic in 6 months.  Nelva Bush, MD 12/10/2019 2:43 PM

## 2019-12-10 NOTE — Patient Instructions (Signed)
Medication Instructions:  Your physician recommends that you continue on your current medications as directed. Please refer to the Current Medication list given to you today.  *If you need a refill on your cardiac medications before your next appointment, please call your pharmacy*  Lab Work: Your physician recommends that you return for lab work in: at the end of March - LIPID, ALT. - You will need to be fasting. Please do not have anything to eat or drink after midnight the morning you have the lab work. You may only have water or black coffee with no cream or sugar. - Please go to the [facility]. You will check in at the front desk to the right as you walk into the atrium. Valet Parking is offered if needed. - No appointment needed. You may go any day between 7 am and 6 pm.  If you have labs (blood work) drawn today and your tests are completely normal, you will receive your results only by: Marland Kitchen MyChart Message (if you have MyChart) OR . A paper copy in the mail If you have any lab test that is abnormal or we need to change your treatment, we will call you to review the results.  Testing/Procedures: Echo in 6 months - Your physician has requested that you have an echocardiogram. Echocardiography is a painless test that uses sound waves to create images of your heart. It provides your doctor with information about the size and shape of your heart and how well your heart's chambers and valves are working. This procedure takes approximately one hour. There are no restrictions for this procedure. You may get an IV, if needed, to receive an ultrasound enhancing agent through to better visualize your heart.   Follow-Up: At [facility], you and your health needs are our priority.  As part of our continuing mission to provide you with exceptional heart care, we have created designated Provider Care Teams.  These Care Teams include your primary Cardiologist (physician) and Advanced Practice  Providers (APPs -  Physician Assistants and Nurse Practitioners) who all work together to provide you with the care you need, when you need it.  Your next appointment:   6 month(s)  The format for your next appointment:   In Person  Provider:    You may see DR Harrell Gave END or one of the following Advanced Practice Providers on your designated Care Team:    Murray Hodgkins, NP  Christell Faith, PA-C  Marrianne Mood, PA-C   Echocardiogram An echocardiogram is a procedure that uses painless sound waves (ultrasound) to produce an image of the heart. Images from an echocardiogram can provide important information about:  Signs of coronary artery disease (CAD).  Aneurysm detection. An aneurysm is a weak or damaged part of an artery wall that bulges out from the normal force of blood pumping through the body.  Heart size and shape. Changes in the size or shape of the heart can be associated with certain conditions, including heart failure, aneurysm, and CAD.  Heart muscle function.  Heart valve function.  Signs of a past heart attack.  Fluid buildup around the heart.  Thickening of the heart muscle.  A tumor or infectious growth around the heart valves. Tell a health care provider about:  Any allergies you have.  All medicines you are taking, including vitamins, herbs, eye drops, creams, and over-the-counter medicines.  Any blood disorders you have.  Any surgeries you have had.  Any medical conditions you have.  Whether you are pregnant or may be pregnant. What are the risks? Generally, this is a safe procedure. However, problems may occur, including:  Allergic reaction to dye (contrast) that may be used during the procedure. What happens before the procedure? No specific preparation is needed. You may eat and drink normally. What happens during the procedure?   An IV tube may be inserted into one of your veins.  You may receive contrast through this tube. A  contrast is an injection that improves the quality of the pictures from your heart.  A gel will be applied to your chest.  A wand-like tool (transducer) will be moved over your chest. The gel will help to transmit the sound waves from the transducer.  The sound waves will harmlessly bounce off of your heart to allow the heart images to be captured in real-time motion. The images will be recorded on a computer. The procedure may vary among health care providers and hospitals. What happens after the procedure?  You may return to your normal, everyday life, including diet, activities, and medicines, unless your health care provider tells you not to do that. Summary  An echocardiogram is a procedure that uses painless sound waves (ultrasound) to produce an image of the heart.  Images from an echocardiogram can provide important information about the size and shape of your heart, heart muscle function, heart valve function, and fluid buildup around your heart.  You do not need to do anything to prepare before this procedure. You may eat and drink normally.  After the echocardiogram is completed, you may return to your normal, everyday life, unless your health care provider tells you not to do that. This information is not intended to replace advice given to you by your health care provider. Make sure you discuss any questions you have with your health care provider. Document Revised: 02/13/2019 Document Reviewed: 11/25/2016 Elsevier Patient Education  [place].

## 2019-12-12 ENCOUNTER — Encounter: Payer: Self-pay | Admitting: Internal Medicine

## 2019-12-12 DIAGNOSIS — I340 Nonrheumatic mitral (valve) insufficiency: Secondary | ICD-10-CM | POA: Insufficient documentation

## 2019-12-12 DIAGNOSIS — I351 Nonrheumatic aortic (valve) insufficiency: Secondary | ICD-10-CM | POA: Insufficient documentation

## 2019-12-16 ENCOUNTER — Other Ambulatory Visit: Payer: Self-pay

## 2019-12-16 ENCOUNTER — Encounter: Payer: Self-pay | Admitting: Internal Medicine

## 2019-12-16 ENCOUNTER — Ambulatory Visit: Payer: Medicare HMO | Admitting: Internal Medicine

## 2019-12-16 VITALS — BP 126/70 | HR 82 | Ht 61.75 in | Wt 149.0 lb

## 2019-12-16 DIAGNOSIS — E042 Nontoxic multinodular goiter: Secondary | ICD-10-CM

## 2019-12-16 NOTE — Patient Instructions (Addendum)
  I will be in touch with you regarding the results of the thyroid ultrasound.  Please schedule another appointment in 2 years.

## 2019-12-16 NOTE — Progress Notes (Signed)
Patient ID: Susan [name], female   DOB: Sep 02, 1946, 74 y.o.   MRN: [id number]   This visit occurred during the SARS-CoV-2 public health emergency.  Safety protocols were in place, including screening questions prior to the visit, additional usage of staff PPE, and extensive cleaning of exam room while observing appropriate contact time as indicated for disinfecting solutions.   HPI  Susan [name] is a 74 y.o.-year-old female, initially referred by her PCP, Dr. Damita Dunnings, returning for follow-up thyroid nodules.  I saw the patient first in 2014 for an initial visit, then in 2015 and 2020.  Last visit a year ago.  She tested positive for Covid 19 after Christmas. She had a mild form, which resolved completely.  Reviewed previous investigation: She was diagnosed with hypothyroidism approximately 15 years ago >> she took levothyroxine for 5 years, then she started a homeopathic medicine and her TFTs normalized afterwards.  She did not restart levothyroxine afterwards.  She also remembers having had a thyroid Uptake and scan (ordered by Dr Ronnald Collum). Around that time, she was also told she had nodules and had Bx of the nodules >> benign:  We obtained the records from Dr Ronnald Collum: - pt not seen in his office since 2005: - 07/10/1997: FNA (site not specified): colloid nodule - 12/22/1998: FNA (sorce: left thyroid, increasind size of nodule): colloid nodule - 07/09/2001: FNA (source: left thyroid; 4x4 cm large mass in RLL - ?, increasing in size): hyperplastic nodule  Thyroid U/S (08/04/2013): 2 large thyroid nodules: 3.1 x 2.8 x 3 cm in the upper left lobe, 4.1 x 3.8 x 4.6 cm in the lower left lobe  FNA of the 2 left nodules (09/02/2013): Benign  Neck CT (10/22/2018): Right lobe of the thyroid surgically absent or severely atrophic, marked enlargement of the left lobe extending into the anterior superior mediastinum consistent with intrathoracic goiter.  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  She has a thyroid ultrasound pending for 01/08/2020.  Pt denies: - feeling nodules in neck - hoarseness - dysphagia - choking - SOB with lying down She has to clears her throat occasionally.  I reviewed patient's TFTs: Lab Results  Component Value Date   TSH 4.07 04/25/2019   TSH 3.940 10/17/2018   TSH 3.06 01/25/2018   TSH 2.30 02/26/2017   TSH 4.34 07/13/2016   TSH 2.36 03/18/2015   TSH 2.69 07/09/2014   TSH 2.58 08/18/2013   TSH 3.11 08/27/2012   TSH 2.747 01/12/2012   FREET4 1.11 10/17/2018    At last visit, she describes fatigue, but no other hypothyroid symptoms.  + FH of thyroid ds.: mother - hypothyroidism. No FH of thyroid cancer. No h/o radiation tx to head or neck.  No seaweed or kelp. No recent contrast studies. No herbal supplements. No Biotin use. No recent steroids use.   She takes CBD oil.  She also has a history of HTN, HL, IBS, OP. Also, macular degeneration >> getting IO injections.  ROS: Constitutional: no weight gain/no weight loss, + fatigue, no subjective hyperthermia, no subjective hypothermia Eyes: no blurry vision, no xerophthalmia ENT: no sore throat, + see HPI Cardiovascular: no CP/no SOB/no palpitations/no leg swelling Respiratory: no cough/no SOB/no wheezing Gastrointestinal: no N/no V/no D/no C/no acid reflux Musculoskeletal: no muscle aches/+ joint aches Skin: no rashes, no hair loss Neurological: no tremors/no numbness/no tingling/no dizziness  I reviewed pt's medications, allergies, PMH, social hx, family hx, and changes were documented in the history of present illness. Otherwise, unchanged from my  initial visit note.  Past Medical History:  Diagnosis Date  . Colon cancer screening   . Diverticulosis of colon   . Hyperlipidemia   . Hypertension   . IBS (irritable bowel syndrome)   . 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 Macular degeneration   . Migraine, unspecified, without mention of intractable migraine without  mention of status migrainosus   . NASH (nonalcoholic steatohepatitis)   . Nonobstructive atherosclerosis of coronary artery 2016   Cardiac CTA with mild proximal LAD disease; CAC score 74.  Marland Kitchen Ocular migraine   . Osteoporosis    on Fosamax for 5 years  . Other abnormal glucose   . Other chronic nonalcoholic liver disease    LFTs normalized with weight loss 2013  . Other screening mammogram   . Symptomatic menopausal or female climacteric states   . Thyroid disease   . Tubular adenoma of colon   . Urinary tract infection, site not specified    Past Surgical History:  Procedure Laterality Date  . abdominal ultrasound  04/12/2004 & 6/06   positive gallstones  . ANKLE FRACTURE SURGERY  ~2002   pinning  . BTL  30+ years ago  . CATARACT EXTRACTION  2013   B eyes  . COLONOSCOPY  06/08/06   Polyps, divertics (Dr. Sharlett Iles)  . CT abdomen  04/29/04   Positive gallstones  . TONSILLECTOMY AND ADENOIDECTOMY  Age 32  . ultrasound pelvis  6/06   Negative   Social History   Socioeconomic History  . Marital status: Married    Spouse name: Not on file  . Number of children: 2  . Years of education: 87  . Highest education level: Not on file  Occupational History  . 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 Smoking status: Former Smoker    Types: Cigarettes    Last attempt to quit: 03/19/2001    Years since quitting: 17.6  . Smokeless tobacco: Never Used  . Tobacco comment: Quit in 2001  Substance and Sexual Activity  . Alcohol use: No    Alcohol/week: 0.0 standard drinks    Comment: Occasional  . Drug use: No  . Not on file  Social History Narrative   From [place]. Lives with husband, Enjoys times with 5 grandkids, 1 great grandchild   Current Outpatient Medications on File Prior to Visit  Medication Sig Dispense Refill  . acetaminophen (TYLENOL) 500 MG tablet Take 1,000 mg by mouth 2 (two) times daily.    Marland Kitchen aspirin 81  MG tablet Take 81 mg by mouth daily.    . Cholecalciferol 1000 units tablet Take 2 tablets (2,000 Units total) by mouth daily.    Marland Kitchen ezetimibe (ZETIA) 10 MG tablet Take 1 tablet (10 mg total) by mouth daily. 30 tablet 4  . lisinopril-hydrochlorothiazide (PRINZIDE,ZESTORETIC) 20-25 MG tablet     . meclizine (ANTIVERT) 12.5 MG tablet Take 1 tablet (12.5 mg total) by mouth 3 (three) times daily as needed for dizziness. 12 tablet 0  . multivitamin-lutein (OCUVITE-LUTEIN) CAPS capsule Take 1 capsule by mouth daily. Taking 1 tablet daily    . vitamin B-12 (CYANOCOBALAMIN) 1000 MCG tablet Take 1,000 mcg by mouth daily.     No current facility-administered medications on file prior to visit.   Allergies  Allergen Reactions  . Lipitor [Atorvastatin Calcium] Other (See Comments)    Aches on med at 63m a day.   .Marland KitchenPenicillins Swelling and Rash  Has patient had a PCN reaction causing immediate rash, facial/tongue/throat swelling, SOB or lightheadedness with hypotension: Yes Has patient had a PCN reaction causing severe rash involving mucus membranes or skin necrosis: No Has patient had a PCN reaction that required hospitalization No Has patient had a PCN reaction occurring within the last 10 years: No If all of the above answers are "NO", then may proceed with Cephalosporin use.   . Sulfonamide Derivatives Rash   Family History  Problem Relation Age of Onset  . Depression Mother        Manic depression  . Diabetes Mother        Type 2  . Breast cancer Mother   . Heart disease Mother   . Colon polyps Father   . Heart attack Father 35  . Breast cancer Maternal Aunt   . Breast cancer Paternal Aunt   . Crohn's disease Brother   . Osteoporosis Brother   . Cancer Neg Hx   . Colon cancer Neg Hx     PE: BP 126/70   Pulse 82   Ht 5' 1.75" (1.568 m) Comment: measured today without shoes  Wt 149 lb (67.6 kg)   BMI 27.47 kg/m  Wt Readings from Last 3 Encounters:  12/16/19 149 lb (67.6 kg)   12/10/19 151 lb 4 oz (68.6 kg)  09/03/19 156 lb (70.8 kg)   Constitutional: Overweight -truncal distribution, in NAD Eyes: PERRLA, EOMI, no exophthalmos ENT: moist mucous membranes, + L slightly enlarged thyroid on palpation, no cervical lymphadenopathy Cardiovascular: RRR, No MRG Respiratory: CTA B Gastrointestinal: abdomen soft, NT, ND, BS+ Musculoskeletal: no deformities, strength intact in all 4 Skin: moist, warm, no rashes Neurological: no tremor with outstretched hands, DTR normal in all 4  ASSESSMENT: 1. Thyroid nodules  PLAN: 1. Thyroid nodules -I reviewed the records of her previous ultrasound reports and biopsies.  The dominant nodules are large, this being a risk factor for cancer.  However, these have been biopsied several times in the past with benign results.  Latest biopsies are from 2014.  Also, patient does not have a family history of thyroid cancer or personal history of radiation therapy to head or neck.  All these would favor benignity. -Before last visit, she had a neck CT scan (performed to evaluate her carotid arteries) which showed an enlarged left thyroid lobe extending in the mediastinum, with tracheal displacement.  However, in the absence of significant neck compression symptoms, there was no indication for thyroid surgery at that time.  However, I did advise her to have another thyroid ultrasound before this visit, and she contacted me about it.  I ordered it but it is unfortunately scheduled only for next month. -At this visit we again discussed about the situations in which she would need surgery for her goiter: Thyroid cancer, neck compression symptoms, cosmetic reasons.  States she did not have any of these 3, we decided to continue to follow her clinically and with repeated ultrasounds.  If the nodules did not change ultrasound characteristics, there is no indication for repeat biopsies.  Patient is relieved that she may not need to have repeat biopsies. -I  advised her to let me know if she develops neck compression symptoms -Otherwise, I plan to see her back in 2 years and check another ultrasound at that time.  US THYROID CLINICAL DATA:  Goiter. 74 year old female with a history of left-sided thyroid nodules. The 2 left-sided thyroid nodules were previously biopsied in October of 2014.  EXAM:  THYROID ULTRASOUND  TECHNIQUE: Ultrasound examination of the thyroid gland and adjacent soft tissues was performed.  COMPARISON:  Prior thyroid nodule biopsy 09/02/2013; prior thyroid nodule ultrasound 08/04/2013  FINDINGS: Parenchymal Echotexture: Markedly heterogenous  Isthmus: 0.1 cm  Right lobe: 2.2 x 0.6 x 0.5 cm  Left lobe: 6.8 x 3.5 x 3.6 cm  _________________________________________________________  Estimated total number of nodules >/= 1 cm: 2  Number of spongiform nodules >/=  2 cm not described below (TR1): 0  Number of mixed cystic and solid nodules >/= 1.5 cm not described below (TR2): 0  _________________________________________________________  Nodule # 1: No significant interval change in the size, features or morphology of the solid isoechoic nodule in the more superior aspect of the left gland. The nodule measures 3.3 x 3.1 x 2.2 cm compared to 3.1 x 3.0 x 2.8 cm previously. Greater than 5 year stability is consistent with benignity.  _________________________________________________________  Nodule # 2: No significant interval change in the size, features or morphology of the solid isoechoic nodule in the more inferior aspect of the left gland. The nodule measures 4.3 x 3.7 x 3.8 cm compared to 4.1 x 3.8 x 4.7 cm previously. Of note, the nodule is difficult to measure given its very inferior position.  _________________________________________________________  IMPRESSION: No significant interval change in the size, features or morphology of the previously biopsied nodules in the left superior and  left inferior thyroid gland compared to prior imaging from September of 2014.  Greater than 5 year stability is consistent with benignity. No further follow-up required.  The above is in keeping with the ACR TI-RADS recommendations - J Am Coll Radiol 2017;14:587-595.  Electronically Signed   By: Jacqulynn Cadet M.D.   On: 01/09/2020 08:03  Her thyroid nodules that are stable.  From now on, we can continue to just follow them clinically.  Philemon Kingdom, MD PhD [facility]

## 2019-12-19 ENCOUNTER — Other Ambulatory Visit: Payer: Self-pay

## 2019-12-19 MED ORDER — LISINOPRIL-HYDROCHLOROTHIAZIDE 20-25 MG PO TABS: 1.0000 | ORAL_TABLET | Freq: Every day | ORAL | 3 refills | Status: DC

## 2019-12-19 NOTE — Telephone Encounter (Signed)
Patient calling in stating that Dr. Chancy Milroy originally prescribed this medication. Patient no longer sees this provider  Please advise

## 2019-12-19 NOTE — Telephone Encounter (Signed)
Please review for refill on Lisinopril HCTZ 20/25 mg take one tablet daily. In the patient's chart showing as historical provider, it's unknown to me who wrote the original Rx.

## 2019-12-22 ENCOUNTER — Other Ambulatory Visit: Payer: Self-pay

## 2019-12-22 MED ORDER — LISINOPRIL-HYDROCHLOROTHIAZIDE 20-25 MG PO TABS: 1.0000 | ORAL_TABLET | Freq: Every day | ORAL | 3 refills | Status: DC

## 2019-12-23 DIAGNOSIS — Z1231 Encounter for screening mammogram for malignant neoplasm of breast: Secondary | ICD-10-CM | POA: Diagnosis not present

## 2020-01-06 DIAGNOSIS — H353212 Exudative age-related macular degeneration, right eye, with inactive choroidal neovascularization: Secondary | ICD-10-CM | POA: Diagnosis not present

## 2020-01-06 DIAGNOSIS — H353221 Exudative age-related macular degeneration, left eye, with active choroidal neovascularization: Secondary | ICD-10-CM | POA: Diagnosis not present

## 2020-01-06 DIAGNOSIS — H353132 Nonexudative age-related macular degeneration, bilateral, intermediate dry stage: Secondary | ICD-10-CM | POA: Diagnosis not present

## 2020-01-08 ENCOUNTER — Ambulatory Visit
Admission: RE | Admit: 2020-01-08 | Discharge: 2020-01-08 | Disposition: A | Discharge status: Home / Self Care | Payer: Medicare HMO | Source: Ambulatory Visit | Attending: Internal Medicine | Admitting: Internal Medicine

## 2020-01-08 DIAGNOSIS — E042 Nontoxic multinodular goiter: Secondary | ICD-10-CM

## 2020-01-08 DIAGNOSIS — E041 Nontoxic single thyroid nodule: Secondary | ICD-10-CM | POA: Diagnosis not present

## 2020-01-12 ENCOUNTER — Telehealth: Payer: Self-pay

## 2020-01-12 DIAGNOSIS — L989 Disorder of the skin and subcutaneous tissue, unspecified: Secondary | ICD-10-CM

## 2020-01-12 NOTE — Telephone Encounter (Signed)
Patient contacted the office and states that that she went to her Cardiologist, Dr. Saunders Revel, today, and he noticed a couple spots on her chest today that were concerning and he advised that she get a referral to a dermatologist by her PCP.  Dr. Damita Dunnings, will patient need an office visit to have this evaluated first?

## 2020-01-13 NOTE — Telephone Encounter (Signed)
Patient has been notified that referral has been placed and she verbalized understanding.

## 2020-01-13 NOTE — Telephone Encounter (Signed)
I put in the referral.  Thanks.  Please tell patient we will contact her as quickly as we can.

## 2020-01-28 ENCOUNTER — Other Ambulatory Visit
Admission: RE | Admit: 2020-01-28 | Discharge: 2020-01-28 | Disposition: A | Discharge status: Home / Self Care | Payer: Medicare HMO | Source: Ambulatory Visit | Attending: Internal Medicine | Admitting: Internal Medicine

## 2020-01-28 DIAGNOSIS — E785 Hyperlipidemia, unspecified: Secondary | ICD-10-CM | POA: Diagnosis not present

## 2020-01-28 DIAGNOSIS — I2510 Atherosclerotic heart disease of native coronary artery without angina pectoris: Secondary | ICD-10-CM | POA: Insufficient documentation

## 2020-01-28 LAB — LIPID PANEL
Cholesterol: 147 mg/dL (ref 0–200)
HDL: 43 mg/dL (ref >40)
LDL Cholesterol: 75 mg/dL (ref 0–99)
Total CHOL/HDL Ratio: 3.4 RATIO
Triglycerides: 145 mg/dL (ref <150)
VLDL: 29 mg/dL (ref 0–40)

## 2020-01-28 LAB — ALT: ALT: 35 U/L (ref 0–44)

## 2020-01-30 ENCOUNTER — Encounter: Payer: Self-pay | Admitting: *Deleted

## 2020-02-09 ENCOUNTER — Other Ambulatory Visit: Payer: Self-pay

## 2020-02-09 ENCOUNTER — Telehealth: Payer: Self-pay | Admitting: Internal Medicine

## 2020-02-09 MED ORDER — EZETIMIBE 10 MG PO TABS: 10.0000 mg | ORAL_TABLET | Freq: Every day | ORAL | 3 refills | Status: DC

## 2020-02-09 NOTE — Telephone Encounter (Signed)
*  STAT* If patient is at the pharmacy, call can be transferred to refill team.   1. Which medications need to be refilled? (please list name of each medication and dose if known) ezetimibe (ZETIA) 10 MG 1 tablet daily   2. Which pharmacy/location (including street and city if local pharmacy) is medication to be sent to? [place]   3. Do they need a 30 day or 90 day supply? 90 day

## 2020-02-09 NOTE — Telephone Encounter (Signed)
ezetimibe (ZETIA) 10 MG tablet 90 tablet 3 02/09/2020 05/09/2020   Sig - Route: Take 1 tablet (10 mg total) by mouth daily. - Oral   Sent to pharmacy as: ezetimibe (ZETIA) 10 MG tablet   E-Prescribing Status: Transmission to pharmacy in progress (02/09/2020 12:10 PM EDT)   Pharmacy  [facility], [address] [place]

## 2020-02-10 ENCOUNTER — Encounter (INDEPENDENT_AMBULATORY_CARE_PROVIDER_SITE_OTHER): Payer: Self-pay | Admitting: Ophthalmology

## 2020-02-10 ENCOUNTER — Ambulatory Visit (INDEPENDENT_AMBULATORY_CARE_PROVIDER_SITE_OTHER): Payer: Medicare HMO | Admitting: Ophthalmology

## 2020-02-10 ENCOUNTER — Other Ambulatory Visit: Payer: Self-pay

## 2020-02-10 DIAGNOSIS — H43811 Vitreous degeneration, right eye: Secondary | ICD-10-CM | POA: Insufficient documentation

## 2020-02-10 DIAGNOSIS — H43812 Vitreous degeneration, left eye: Secondary | ICD-10-CM | POA: Diagnosis not present

## 2020-02-10 DIAGNOSIS — H353132 Nonexudative age-related macular degeneration, bilateral, intermediate dry stage: Secondary | ICD-10-CM | POA: Insufficient documentation

## 2020-02-10 DIAGNOSIS — H35351 Cystoid macular degeneration, right eye: Secondary | ICD-10-CM | POA: Diagnosis not present

## 2020-02-10 DIAGNOSIS — H353212 Exudative age-related macular degeneration, right eye, with inactive choroidal neovascularization: Secondary | ICD-10-CM | POA: Insufficient documentation

## 2020-02-10 DIAGNOSIS — H353114 Nonexudative age-related macular degeneration, right eye, advanced atrophic with subfoveal involvement: Secondary | ICD-10-CM | POA: Insufficient documentation

## 2020-02-10 DIAGNOSIS — H353221 Exudative age-related macular degeneration, left eye, with active choroidal neovascularization: Secondary | ICD-10-CM

## 2020-02-10 MED ORDER — BEVACIZUMAB CHEMO INJECTION 1.25MG/0.05ML SYRINGE FOR KALEIDOSCOPE
1.2500 mg | INTRAVITREAL | Status: AC | PRN
  Administered 2020-02-10: 1.25 mg via INTRAVITREAL

## 2020-02-10 NOTE — Patient Instructions (Signed)
The nature of wet macular degeneration was discussed with the patient.  Forms of therapy reviewed include the use of Anti-VEGF medications injected painlessly into the eye, as well as other possible treatment modalities, including thermal laser therapy. Fellow eye involvement and risks were discussed with the patient. Upon the finding of wet age related macular degeneration, treatment will be offered. The treatment regimen is on a treat as needed basis with the intent to treat if necessary and extend interval of exams when possible. On average 1 out of 6 patients do not need lifetime therapy. However, the risk of recurrent disease is high for a lifetime.  Initially monthly, then periodic, examinations and evaluations will determine whether the next treatment is required on the day of the examination.    The nature of posterior vitreous detachment was discussed with the patient as well as its physiology, its age prevalence, and its possible implication regarding retinal breaks and detachment.  An informational brochure was given to the patient.  All the patient's questions were answered.  The patient was asked to return if new or different flashes or floaters develops.   Patient was instructed to contact office immediately if any changes were noticed. I explained to the patient that vitreous inside the eye is similar to jello inside a bowl. As the jello melts it can start to pull away from the bowl, similarly the vitreous throughout our lives can begin to pull away from the retina. That process is called a posterior vitreous detachment. In some cases, the vitreous can tug hard enough on the retina to form a retinal tear. I discussed with the patient the signs and symptoms of a retinal detachment.  Do not rub the eye.

## 2020-03-16 ENCOUNTER — Encounter (INDEPENDENT_AMBULATORY_CARE_PROVIDER_SITE_OTHER): Payer: Self-pay | Admitting: Ophthalmology

## 2020-03-16 ENCOUNTER — Other Ambulatory Visit: Payer: Self-pay

## 2020-03-16 ENCOUNTER — Ambulatory Visit (INDEPENDENT_AMBULATORY_CARE_PROVIDER_SITE_OTHER): Payer: Medicare HMO | Admitting: Ophthalmology

## 2020-03-16 DIAGNOSIS — H353221 Exudative age-related macular degeneration, left eye, with active choroidal neovascularization: Secondary | ICD-10-CM

## 2020-03-16 DIAGNOSIS — H353212 Exudative age-related macular degeneration, right eye, with inactive choroidal neovascularization: Secondary | ICD-10-CM | POA: Diagnosis not present

## 2020-03-16 MED ORDER — BEVACIZUMAB CHEMO INJECTION 1.25MG/0.05ML SYRINGE FOR KALEIDOSCOPE
1.2500 mg | INTRAVITREAL | Status: AC | PRN
  Administered 2020-03-16: 1.25 mg via INTRAVITREAL

## 2020-03-16 NOTE — Progress Notes (Signed)
03/16/2020     CHIEF COMPLAINT Patient presents for Retina Follow Up   HISTORY OF PRESENT ILLNESS: Susan [name] is a 74 y.o. 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  Duration of 5 weeks.  Since onset it is stable.          Comments    5 week follow up - OCT OU, Possible Avastin OS Patient denies change in vision and overall has no complaints, besides having trouble with lighting.       Last edited by Gerda Diss on 03/16/2020 10:06 AM. (History)      Referring physician: Tonia Ghent, MD [place],  [place] [id number]  HISTORICAL INFORMATION:   Selected notes from the MEDICAL RECORD NUMBER    Lab Results  Component Value Date   HGBA1C 6.0 02/26/2017     CURRENT MEDICATIONS: No current outpatient medications on file. (Ophthalmic Drugs)   No current facility-administered medications for this visit. (Ophthalmic Drugs)   Current Outpatient Medications (Other)  Medication Sig  . acetaminophen (TYLENOL) 500 MG tablet Take 1,000 mg by mouth 2 (two) times daily.  Marland Kitchen aspirin 81 MG tablet Take 81 mg by mouth daily.  . Cholecalciferol 1000 units tablet Take 2 tablets (2,000 Units total) by mouth daily.  Marland Kitchen ezetimibe (ZETIA) 10 MG tablet Take 1 tablet (10 mg total) by mouth daily.  Marland Kitchen lisinopril-hydrochlorothiazide (ZESTORETIC) 20-25 MG tablet Take 1 tablet by mouth daily.  . meclizine (ANTIVERT) 12.5 MG tablet Take 1 tablet (12.5 mg total) by mouth 3 (three) times daily as needed for dizziness.  . multivitamin-lutein (OCUVITE-LUTEIN) CAPS capsule Take 1 capsule by mouth daily. Taking 1 tablet daily  . vitamin B-12 (CYANOCOBALAMIN) 1000 MCG tablet Take 1,000 mcg by mouth daily.   No current facility-administered medications for this visit. (Other)      REVIEW OF SYSTEMS:    ALLERGIES Allergies  Allergen Reactions  . Lipitor [Atorvastatin Calcium] Other (See  Comments)    Aches on med at 33m a day.   .Marland KitchenPenicillins Swelling and Rash    Has patient had a PCN reaction causing immediate rash, facial/tongue/throat swelling, SOB or lightheadedness with hypotension: Yes Has patient had a PCN reaction causing severe rash involving mucus membranes or skin necrosis: No Has patient had a PCN reaction that required hospitalization No Has patient had a PCN reaction occurring within the last 10 years: No If all of the above answers are "NO", then may proceed with Cephalosporin use.   . Sulfonamide Derivatives Rash    PAST MEDICAL HISTORY Past Medical History:  Diagnosis Date  . Colon cancer screening   . Diverticulosis of colon   . Hyperlipidemia   . Hypertension   . IBS (irritable bowel syndrome)   . 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 Macular degeneration   . Migraine, unspecified, without mention of intractable migraine without mention of status migrainosus   . NASH (nonalcoholic steatohepatitis)   . Nonobstructive atherosclerosis of coronary artery 2016   Cardiac CTA with mild proximal LAD disease; CAC score 74.  .Marland KitchenOcular migraine   . Osteoporosis    on Fosamax for 5 years  . Other abnormal glucose   . Other chronic nonalcoholic liver disease    LFTs normalized with weight loss 2013  . Other screening mammogram   . Symptomatic menopausal or female  climacteric states   . Thyroid disease   . Tubular adenoma of colon   . Urinary tract infection, site not specified    Past Surgical History:  Procedure Laterality Date  . abdominal ultrasound  04/12/2004 & 6/06   positive gallstones  . ANKLE FRACTURE SURGERY  ~2002   pinning  . BTL  30+ years ago  . CATARACT EXTRACTION  2013   B eyes  . COLONOSCOPY  06/08/06   Polyps, divertics (Dr. Sharlett Iles)  . CT abdomen  04/29/04   Positive gallstones  . TONSILLECTOMY AND ADENOIDECTOMY  Age 41  . ultrasound pelvis  6/06   Negative    FAMILY HISTORY Family History  Problem Relation Age of Onset  .  Depression Mother        Manic depression  . Diabetes Mother        Type 2  . Breast cancer Mother   . Heart disease Mother   . Colon polyps Father   . Heart attack Father 48  . Breast cancer Maternal Aunt   . Breast cancer Paternal Aunt   . Crohn's disease Brother   . Osteoporosis Brother   . Cancer Neg Hx   . Colon cancer Neg Hx     SOCIAL HISTORY Social History   Tobacco Use  . Smoking status: Former Smoker    Types: Cigarettes    Quit date: 03/19/2001    Years since quitting: 19.0  . Smokeless tobacco: Never Used  . Tobacco comment: Quit in 2001  Substance Use Topics  . Alcohol use: Not Currently    Comment: 1 glass of wine per month  . Drug use: No    Comment: Uses CBD oil to help calm down         OPHTHALMIC EXAM: Base Eye Exam    Visual Acuity (Snellen - Linear)      Right Left   Dist [place] CF @ 8' 20/30   Dist ph [place] 20/200+1 NI       Tonometry (Tonopen, 10:13 AM)      Right Left   Pressure 13 16       Pupils      Pupils Dark Light Shape React APD   Right PERRL 4 3 Round Slow None   Left PERRL 4 3 Round Slow None       Visual Fields (Counting fingers)      Left Right    Full Full       Extraocular Movement      Right Left    Full Full       Neuro/Psych    Oriented x3: Yes   Mood/Affect: Normal       Dilation    Left eye: 1.0% Mydriacyl, 2.5% Phenylephrine @ 10:13 AM        Slit Lamp and Fundus Exam    External Exam      Right Left   External Normal Normal       Slit Lamp Exam      Right Left   Lids/Lashes Normal Normal   Conjunctiva/Sclera White and quiet White and quiet   Cornea Clear Clear   Anterior Chamber Deep and quiet Deep and quiet   Iris Round and reactive Round and reactive   Lens Posterior chamber intraocular lens Posterior chamber intraocular lens   Anterior Vitreous Normal Normal       Fundus Exam      Right Left   Posterior Vitreous Posterior vitreous detachment Posterior vitreous detachment  Disc  Normal    C/D Ratio  0.3   Macula  Atrophy, Age related macular degeneration, Retinal pigment epithelial atrophy, Drusen, Disciform scar   Vessels  Normal   Periphery  Normal          IMAGING AND PROCEDURES  Imaging and Procedures for 03/16/20  OCT, Retina - OU - Both Eyes       Right Eye Quality was good. Scan locations included subfoveal.   Left Eye Quality was good. Scan locations included subfoveal.                 ASSESSMENT/PLAN:  No problem-specific Assessment & Plan notes found for this encounter.    No diagnosis found.  1.OD, with new active CNVM formation.  Will need to start therapy OD soon  OS, much improved on therapy currently at 5 weeks will treat with intravitreal Avastin today in 6 weeks examination  2.  3.  Ophthalmic Meds Ordered this visit:  No orders of the defined types were placed in this encounter.      No follow-ups on file.  There are no Patient Instructions on file for this visit.   Explained the diagnoses, plan, and follow up with the patient and they expressed understanding.  Patient expressed understanding of the importance of proper follow up care.   Clent Demark Rankin M.D. Diseases & Surgery of the Retina and Vitreous Retina & Diabetic [place] 03/16/20     Abbreviations: M myopia (nearsighted); A astigmatism; H hyperopia (farsighted); P presbyopia; Mrx spectacle prescription;  CTL contact lenses; OD right eye; OS left eye; OU both eyes  XT exotropia; ET esotropia; PEK punctate epithelial keratitis; PEE punctate epithelial erosions; DES dry eye syndrome; MGD meibomian gland dysfunction; ATs artificial tears; PFAT's preservative free artificial tears; [place] nuclear sclerotic cataract; PSC posterior subcapsular cataract; ERM epi-retinal membrane; PVD posterior vitreous detachment; RD retinal detachment; DM diabetes mellitus; DR diabetic retinopathy; NPDR non-proliferative diabetic retinopathy; PDR proliferative diabetic  retinopathy; CSME clinically significant macular edema; DME diabetic macular edema; dbh dot blot hemorrhages; CWS cotton wool spot; POAG primary open angle glaucoma; C/D cup-to-disc ratio; HVF humphrey visual field; GVF goldmann visual field; OCT optical coherence tomography; IOP intraocular pressure; BRVO Branch retinal vein occlusion; CRVO central retinal vein occlusion; CRAO central retinal artery occlusion; BRAO branch retinal artery occlusion; RT retinal tear; SB scleral buckle; PPV pars plana vitrectomy; VH Vitreous hemorrhage; PRP panretinal laser photocoagulation; IVK intravitreal kenalog; VMT vitreomacular traction; MH Macular hole;  NVD neovascularization of the disc; NVE neovascularization elsewhere; AREDS age related eye disease study; ARMD age related macular degeneration; POAG primary open angle glaucoma; EBMD epithelial/anterior basement membrane dystrophy; ACIOL anterior chamber intraocular lens; IOL intraocular lens; PCIOL posterior chamber intraocular lens; Phaco/IOL phacoemulsification with intraocular lens placement; [place] photorefractive keratectomy; LASIK laser assisted in situ keratomileusis; HTN hypertension; DM diabetes mellitus; COPD chronic obstructive pulmonary disease

## 2020-03-16 NOTE — Assessment & Plan Note (Signed)
The nature of wet macular degeneration was discussed with the patient.  Forms of therapy reviewed include the use of Anti-VEGF medications injected painlessly into the eye, as well as other possible treatment modalities, including thermal laser therapy. Fellow eye involvement and risks were discussed with the patient. Upon the finding of wet age related macular degeneration, treatment will be offered. The treatment regimen is on a treat as needed basis with the intent to treat if necessary and extend interval of exams when possible. On average 1 out of 6 patients do not need lifetime therapy. However, the risk of recurrent disease is high for a lifetime.  Initially monthly, then periodic, examinations and evaluations will determine whether the next treatment is required on the day of the examination.  OD with new findings by OCT.  Will need return follow-up examination and likely use of antiveg F, intravitreal Avastin promptly

## 2020-03-16 NOTE — Assessment & Plan Note (Signed)
OS, improved anatomy and stable acuity on intravitreal Avastin.  Currently at 5-week exam interval.  Will repeat intravitreal Avastin OS today and examination in 6 weeks

## 2020-03-23 ENCOUNTER — Other Ambulatory Visit: Payer: Self-pay

## 2020-03-23 ENCOUNTER — Encounter (INDEPENDENT_AMBULATORY_CARE_PROVIDER_SITE_OTHER): Payer: Self-pay | Admitting: Ophthalmology

## 2020-03-23 ENCOUNTER — Ambulatory Visit (INDEPENDENT_AMBULATORY_CARE_PROVIDER_SITE_OTHER): Payer: Medicare HMO | Admitting: Ophthalmology

## 2020-03-23 DIAGNOSIS — H353211 Exudative age-related macular degeneration, right eye, with active choroidal neovascularization: Secondary | ICD-10-CM | POA: Insufficient documentation

## 2020-03-23 DIAGNOSIS — H353212 Exudative age-related macular degeneration, right eye, with inactive choroidal neovascularization: Secondary | ICD-10-CM

## 2020-03-23 HISTORY — DX: Exudative age-related macular degeneration, right eye, with active choroidal neovascularization: H35.3211

## 2020-03-23 MED ORDER — BEVACIZUMAB CHEMO INJECTION 1.25MG/0.05ML SYRINGE FOR KALEIDOSCOPE
1.2500 mg | INTRAVITREAL | Status: AC | PRN
  Administered 2020-03-23: 1.25 mg via INTRAVITREAL

## 2020-03-23 NOTE — Progress Notes (Signed)
03/23/2020     CHIEF COMPLAINT Patient presents for Retina Follow Up   HISTORY OF PRESENT ILLNESS: Susan [name] is a 74 y.o. female who presents to the clinic today for:   HPI    Retina Follow Up    Patient presents with  Wet AMD.  In right eye.  This started 1 week ago.  Severity is mild.  Duration of 1 week.  Since onset it is stable.          Comments    1 Week AMD F/U OD, Avastin OD  Pt sts VA OS is not as "smoky" as it was. Pt reports stable VA OD.       Last edited by Rockie Neighbours, [place] on 03/23/2020 10:42 AM. (History)      Referring physician: Tonia Ghent, MD [place],  [place] [address]  HISTORICAL INFORMATION:   Selected notes from the MEDICAL RECORD NUMBER    Lab Results  Component Value Date   HGBA1C 6.0 02/26/2017     CURRENT MEDICATIONS: No current outpatient medications on file. (Ophthalmic Drugs)   No current facility-administered medications for this visit. (Ophthalmic Drugs)   Current Outpatient Medications (Other)  Medication Sig  . acetaminophen (TYLENOL) 500 MG tablet Take 1,000 mg by mouth 2 (two) times daily.  Marland Kitchen aspirin 81 MG tablet Take 81 mg by mouth daily.  . Cholecalciferol 1000 units tablet Take 2 tablets (2,000 Units total) by mouth daily.  Marland Kitchen ezetimibe (ZETIA) 10 MG tablet Take 1 tablet (10 mg total) by mouth daily.  Marland Kitchen lisinopril-hydrochlorothiazide (ZESTORETIC) 20-25 MG tablet Take 1 tablet by mouth daily.  . meclizine (ANTIVERT) 12.5 MG tablet Take 1 tablet (12.5 mg total) by mouth 3 (three) times daily as needed for dizziness.  . multivitamin-lutein (OCUVITE-LUTEIN) CAPS capsule Take 1 capsule by mouth daily. Taking 1 tablet daily  . vitamin B-12 (CYANOCOBALAMIN) 1000 MCG tablet Take 1,000 mcg by mouth daily.   No current facility-administered medications for this visit. (Other)      REVIEW OF SYSTEMS:    ALLERGIES Allergies  Allergen Reactions  . Lipitor [Atorvastatin Calcium] Other  (See Comments)    Aches on med at 33m a day.   .Marland KitchenPenicillins Swelling and Rash    Has patient had a PCN reaction causing immediate rash, facial/tongue/throat swelling, SOB or lightheadedness with hypotension: Yes Has patient had a PCN reaction causing severe rash involving mucus membranes or skin necrosis: No Has patient had a PCN reaction that required hospitalization No Has patient had a PCN reaction occurring within the last 10 years: No If all of the above answers are "NO", then may proceed with Cephalosporin use.   . Sulfonamide Derivatives Rash    PAST MEDICAL HISTORY Past Medical History:  Diagnosis Date  . Colon cancer screening   . Diverticulosis of colon   . Hyperlipidemia   . Hypertension   . IBS (irritable bowel syndrome)   . 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 Macular degeneration   . Migraine, unspecified, without mention of intractable migraine without mention of status migrainosus   . NASH (nonalcoholic steatohepatitis)   . Nonobstructive atherosclerosis of coronary artery 2016   Cardiac CTA with mild proximal LAD disease; CAC score 74.  .Marland KitchenOcular migraine   . Osteoporosis    on Fosamax for 5 years  . Other abnormal glucose   . Other chronic nonalcoholic liver disease    LFTs normalized with weight loss 2013  .  Other screening mammogram   . Symptomatic menopausal or female climacteric states   . Thyroid disease   . Tubular adenoma of colon   . Urinary tract infection, site not specified    Past Surgical History:  Procedure Laterality Date  . abdominal ultrasound  04/12/2004 & 6/06   positive gallstones  . ANKLE FRACTURE SURGERY  ~2002   pinning  . BTL  30+ years ago  . CATARACT EXTRACTION  2013   B eyes  . COLONOSCOPY  06/08/06   Polyps, divertics (Dr. Sharlett Iles)  . CT abdomen  04/29/04   Positive gallstones  . TONSILLECTOMY AND ADENOIDECTOMY  Age 67  . ultrasound pelvis  6/06   Negative    FAMILY HISTORY Family History  Problem Relation Age of Onset    . Depression Mother        Manic depression  . Diabetes Mother        Type 2  . Breast cancer Mother   . Heart disease Mother   . Colon polyps Father   . Heart attack Father 68  . Breast cancer Maternal Aunt   . Breast cancer Paternal Aunt   . Crohn's disease Brother   . Osteoporosis Brother   . Cancer Neg Hx   . Colon cancer Neg Hx     SOCIAL HISTORY Social History   Tobacco Use  . Smoking status: Former Smoker    Types: Cigarettes    Quit date: 03/19/2001    Years since quitting: 19.0  . Smokeless tobacco: Never Used  . Tobacco comment: Quit in 2001  Substance Use Topics  . Alcohol use: Not Currently    Comment: 1 glass of wine per month  . Drug use: No    Comment: Uses CBD oil to help calm down         OPHTHALMIC EXAM: Base Eye Exam    Visual Acuity (ETDRS)      Right Left   Dist cc CF @ 4' 20/50 +2   Dist ph cc NI 20/40 -2   Correction: Glasses       Tonometry (Tonopen, 10:45 AM)      Right Left   Pressure 14 15       Pupils      Pupils Dark Light Shape React APD   Right PERRL 4 3 Round Brisk None   Left PERRL 4 3 Round Brisk None       Visual Fields (Counting fingers)      Left Right    Full Full       Extraocular Movement      Right Left    Full Full       Neuro/Psych    Oriented x3: Yes   Mood/Affect: Normal       Dilation    Right eye: 1.0% Mydriacyl, 2.5% Phenylephrine @ 10:45 AM        Slit Lamp and Fundus Exam    External Exam      Right Left   External Normal Normal       Slit Lamp Exam      Right Left   Lids/Lashes Normal Normal   Conjunctiva/Sclera White and quiet White and quiet   Cornea Clear Clear   Anterior Chamber Deep and quiet Deep and quiet   Iris Round and reactive Round and reactive   Lens Posterior chamber intraocular lens Posterior chamber intraocular lens   Anterior Vitreous Normal Normal       Fundus Exam  Right Left   Posterior Vitreous  Posterior vitreous detachment   Disc  Normal   C/D  Ratio  0.55   Macula  Atrophy, Age related macular degeneration, Retinal pigment epithelial atrophy, Drusen, Disciform scar   Vessels  Normal   Periphery  Normal          IMAGING AND PROCEDURES  Imaging and Procedures for 03/23/20           ASSESSMENT/PLAN:  No problem-specific Assessment & Plan notes found for this encounter.      ICD-10-CM   1. Exudative age-related macular degeneration of right eye with inactive choroidal neovascularization (HCC)  H35.3212 OCT, Retina - OU - Both Eyes    1.  2.  3.  Ophthalmic Meds Ordered this visit:  No orders of the defined types were placed in this encounter.      No follow-ups on file.  There are no Patient Instructions on file for this visit.   Explained the diagnoses, plan, and follow up with the patient and they expressed understanding.  Patient expressed understanding of the importance of proper follow up care.   Clent Demark Shontelle Muska M.D. Diseases & Surgery of the Retina and Vitreous Retina & Diabetic [place] 03/23/20     Abbreviations: M myopia (nearsighted); A astigmatism; H hyperopia (farsighted); P presbyopia; Mrx spectacle prescription;  CTL contact lenses; OD right eye; OS left eye; OU both eyes  XT exotropia; ET esotropia; PEK punctate epithelial keratitis; PEE punctate epithelial erosions; DES dry eye syndrome; MGD meibomian gland dysfunction; ATs artificial tears; PFAT's preservative free artificial tears; [place] nuclear sclerotic cataract; PSC posterior subcapsular cataract; ERM epi-retinal membrane; PVD posterior vitreous detachment; RD retinal detachment; DM diabetes mellitus; DR diabetic retinopathy; NPDR non-proliferative diabetic retinopathy; PDR proliferative diabetic retinopathy; CSME clinically significant macular edema; DME diabetic macular edema; dbh dot blot hemorrhages; CWS cotton wool spot; POAG primary open angle glaucoma; C/D cup-to-disc ratio; HVF humphrey visual field; GVF goldmann visual field;  OCT optical coherence tomography; IOP intraocular pressure; BRVO Branch retinal vein occlusion; CRVO central retinal vein occlusion; CRAO central retinal artery occlusion; BRAO branch retinal artery occlusion; RT retinal tear; SB scleral buckle; PPV pars plana vitrectomy; VH Vitreous hemorrhage; PRP panretinal laser photocoagulation; IVK intravitreal kenalog; VMT vitreomacular traction; MH Macular hole;  NVD neovascularization of the disc; NVE neovascularization elsewhere; AREDS age related eye disease study; ARMD age related macular degeneration; POAG primary open angle glaucoma; EBMD epithelial/anterior basement membrane dystrophy; ACIOL anterior chamber intraocular lens; IOL intraocular lens; PCIOL posterior chamber intraocular lens; Phaco/IOL phacoemulsification with intraocular lens placement; [place] photorefractive keratectomy; LASIK laser assisted in situ keratomileusis; HTN hypertension; DM diabetes mellitus; COPD chronic obstructive pulmonary disease

## 2020-03-29 ENCOUNTER — Ambulatory Visit: Payer: Medicare HMO | Admitting: Dermatology

## 2020-03-29 ENCOUNTER — Other Ambulatory Visit: Payer: Self-pay

## 2020-03-29 DIAGNOSIS — L814 Other melanin hyperpigmentation: Secondary | ICD-10-CM | POA: Diagnosis not present

## 2020-03-29 DIAGNOSIS — L821 Other seborrheic keratosis: Secondary | ICD-10-CM

## 2020-03-29 DIAGNOSIS — D225 Melanocytic nevi of trunk: Secondary | ICD-10-CM | POA: Diagnosis not present

## 2020-03-29 DIAGNOSIS — D229 Melanocytic nevi, unspecified: Secondary | ICD-10-CM

## 2020-03-29 DIAGNOSIS — L820 Inflamed seborrheic keratosis: Secondary | ICD-10-CM | POA: Diagnosis not present

## 2020-03-29 DIAGNOSIS — L578 Other skin changes due to chronic exposure to nonionizing radiation: Secondary | ICD-10-CM

## 2020-03-29 NOTE — Progress Notes (Signed)
   New Patient Visit  Subjective  Susan [name] is a 74 y.o. female who presents for the following: check spots (chest and back, ~42yr pt rubs on them). Gets irritated.   The following portions of the chart were reviewed this encounter and updated as appropriate:      Review of Systems:  No other skin or systemic complaints except as noted in HPI or Assessment and Plan.  Objective  Well appearing patient in no apparent distress; mood and affect are within normal limits.  A focused examination was performed including face, chest, arms, hands, back. Relevant physical exam findings are noted in the Assessment and Plan.  Objective  R chest x 1, L hand dorsum x 1 (2): Erythematous keratotic or waxy stuck-on papule.   Objective  R spinal upper back: 5.0101mpink flesh papule, Present for yrs per patient   Assessment & Plan   Actinic Damage - diffuse scaly erythematous macules with underlying dyspigmentation - Recommend daily broad spectrum sunscreen SPF 30+ to sun-exposed areas, reapply every 2 hours as needed.  - Call for new or changing lesions.  Seborrheic Keratoses - Stuck-on, waxy, tan-brown papules and plaques  - Discussed benign etiology and prognosis. - Observe - Call for any changes  Lentigines - Scattered tan macules - Discussed due to sun exposure - Benign, observe - Call for any changes  Inflamed seborrheic keratosis (2) R chest x 1, L hand dorsum x 1  Discussed risk of PIPA with LN2  Destruction of lesion - R chest x 1, L hand dorsum x 1  Destruction method: cryotherapy   Informed consent: discussed and consent obtained   Lesion destroyed using liquid nitrogen: Yes   Region frozen until ice ball extended beyond lesion: Yes   Outcome: patient tolerated procedure well with no complications   Post-procedure details: wound care instructions given    Nevus R spinal upper back  Benign-appearing.  Observation.  Call clinic for new or changing moles.   Recommend daily use of broad spectrum spf 30+ sunscreen to sun-exposed areas.     Return if symptoms worsen or fail to improve.  I, SoOthelia PullingRMA, am acting as scribe for TaBrendolyn PattyMD .  Documentation: I have reviewed the above documentation for accuracy and completeness, and I agree with the above.  TaBrendolyn PattyD

## 2020-03-29 NOTE — Patient Instructions (Signed)
Cryotherapy Aftercare  . Wash gently with soap and water everyday.   . Apply Vaseline and Band-Aid daily until healed.  

## 2020-04-28 ENCOUNTER — Other Ambulatory Visit: Payer: Self-pay

## 2020-04-28 ENCOUNTER — Ambulatory Visit (INDEPENDENT_AMBULATORY_CARE_PROVIDER_SITE_OTHER): Payer: Medicare HMO | Admitting: Ophthalmology

## 2020-04-28 ENCOUNTER — Encounter (INDEPENDENT_AMBULATORY_CARE_PROVIDER_SITE_OTHER): Payer: Self-pay | Admitting: Ophthalmology

## 2020-04-28 DIAGNOSIS — H353221 Exudative age-related macular degeneration, left eye, with active choroidal neovascularization: Secondary | ICD-10-CM | POA: Diagnosis not present

## 2020-04-28 DIAGNOSIS — H353211 Exudative age-related macular degeneration, right eye, with active choroidal neovascularization: Secondary | ICD-10-CM | POA: Diagnosis not present

## 2020-04-28 DIAGNOSIS — H353231 Exudative age-related macular degeneration, bilateral, with active choroidal neovascularization: Secondary | ICD-10-CM

## 2020-04-28 DIAGNOSIS — H353212 Exudative age-related macular degeneration, right eye, with inactive choroidal neovascularization: Secondary | ICD-10-CM | POA: Diagnosis not present

## 2020-04-28 MED ORDER — BEVACIZUMAB CHEMO INJECTION 1.25MG/0.05ML SYRINGE FOR KALEIDOSCOPE
1.2500 mg | INTRAVITREAL | Status: AC | PRN
  Administered 2020-04-28: 1.25 mg via INTRAVITREAL

## 2020-04-28 NOTE — Assessment & Plan Note (Signed)
Vastly improved anatomy of visual functioning left eye after recent development of wet ARMD OS January 2021.  Repeat intravitreal Avastin OS today

## 2020-04-28 NOTE — Assessment & Plan Note (Signed)
New onset wet AMD OD activity OD, May 2021, proved after first intravitreal Avastin OD, repeat injection OD today and exam in 5 weeks

## 2020-04-28 NOTE — Progress Notes (Signed)
04/28/2020     CHIEF COMPLAINT Patient presents for Retina Follow Up   HISTORY OF PRESENT ILLNESS: Susan [name] is a 74 y.o. female who presents to the clinic today for:   HPI    Retina Follow Up    Patient presents with  Wet AMD.  In right eye.  Duration of 5 weeks.  Since onset it is stable.          Comments    5 week f.u - OCT OU, Poss Avastin OU Patient denies change in vision and overall has no complaints, except for dryness.        Last edited by Gerda Diss on 04/28/2020 10:47 AM. (History)      Referring physician: Tonia Ghent, MD [place],  [place] [address]  HISTORICAL INFORMATION:   Selected notes from the MEDICAL RECORD NUMBER    Lab Results  Component Value Date   HGBA1C 6.0 02/26/2017     CURRENT MEDICATIONS: No current outpatient medications on file. (Ophthalmic Drugs)   No current facility-administered medications for this visit. (Ophthalmic Drugs)   Current Outpatient Medications (Other)  Medication Sig  . acetaminophen (TYLENOL) 500 MG tablet Take 1,000 mg by mouth 2 (two) times daily.  Marland Kitchen aspirin 81 MG tablet Take 81 mg by mouth daily.  . Cholecalciferol 1000 units tablet Take 2 tablets (2,000 Units total) by mouth daily.  Marland Kitchen ezetimibe (ZETIA) 10 MG tablet Take 1 tablet (10 mg total) by mouth daily.  Marland Kitchen lisinopril-hydrochlorothiazide (ZESTORETIC) 20-25 MG tablet Take 1 tablet by mouth daily.  . meclizine (ANTIVERT) 12.5 MG tablet Take 1 tablet (12.5 mg total) by mouth 3 (three) times daily as needed for dizziness.  . multivitamin-lutein (OCUVITE-LUTEIN) CAPS capsule Take 1 capsule by mouth daily. Taking 1 tablet daily  . vitamin B-12 (CYANOCOBALAMIN) 1000 MCG tablet Take 1,000 mcg by mouth daily.   No current facility-administered medications for this visit. (Other)      REVIEW OF SYSTEMS:    ALLERGIES Allergies  Allergen Reactions  . Lipitor [Atorvastatin Calcium] Other (See Comments)    Aches on  med at 66m a day.   .Marland KitchenPenicillins Swelling and Rash    Has patient had a PCN reaction causing immediate rash, facial/tongue/throat swelling, SOB or lightheadedness with hypotension: Yes Has patient had a PCN reaction causing severe rash involving mucus membranes or skin necrosis: No Has patient had a PCN reaction that required hospitalization No Has patient had a PCN reaction occurring within the last 10 years: No If all of the above answers are "NO", then may proceed with Cephalosporin use.   . Sulfonamide Derivatives Rash    PAST MEDICAL HISTORY Past Medical History:  Diagnosis Date  . Colon cancer screening   . Diverticulosis of colon   . Hyperlipidemia   . Hypertension   . IBS (irritable bowel syndrome)   . 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 Macular degeneration   . Migraine, unspecified, without mention of intractable migraine without mention of status migrainosus   . NASH (nonalcoholic steatohepatitis)   . Nonobstructive atherosclerosis of coronary artery 2016   Cardiac CTA with mild proximal LAD disease; CAC score 74.  .Marland KitchenOcular migraine   . Osteoporosis    on Fosamax for 5 years  . Other abnormal glucose   . Other chronic nonalcoholic liver disease    LFTs normalized with weight loss 2013  . Other screening mammogram   . Symptomatic menopausal or female climacteric states   .  Thyroid disease   . Tubular adenoma of colon   . Urinary tract infection, site not specified    Past Surgical History:  Procedure Laterality Date  . abdominal ultrasound  04/12/2004 & 6/06   positive gallstones  . ANKLE FRACTURE SURGERY  ~2002   pinning  . BTL  30+ years ago  . CATARACT EXTRACTION  2013   B eyes  . COLONOSCOPY  06/08/06   Polyps, divertics (Dr. Sharlett Iles)  . CT abdomen  04/29/04   Positive gallstones  . TONSILLECTOMY AND ADENOIDECTOMY  Age 20  . ultrasound pelvis  6/06   Negative    FAMILY HISTORY Family History  Problem Relation Age of Onset  . Depression Mother         Manic depression  . Diabetes Mother        Type 2  . Breast cancer Mother   . Heart disease Mother   . Colon polyps Father   . Heart attack Father 31  . Breast cancer Maternal Aunt   . Breast cancer Paternal Aunt   . Crohn's disease Brother   . Osteoporosis Brother   . Cancer Neg Hx   . Colon cancer Neg Hx     SOCIAL HISTORY Social History   Tobacco Use  . Smoking status: Former Smoker    Types: Cigarettes    Quit date: 03/19/2001    Years since quitting: 19.1  . Smokeless tobacco: Never Used  . Tobacco comment: Quit in 2001  Vaping Use  . Vaping Use: Never used  Substance Use Topics  . Alcohol use: Not Currently    Comment: 1 glass of wine per month  . Drug use: No    Comment: Uses CBD oil to help calm down         OPHTHALMIC EXAM:  Base Eye Exam    Visual Acuity (Snellen - Linear)      Right Left   Dist cc CF @ 4' 20/25-2   Dist ph cc NI        Tonometry (Tonopen, 10:50 AM)      Right Left   Pressure 14 14       Pupils      Pupils Dark Light Shape React APD   Right PERRL 4 3 Round Brisk None   Left PERRL 4 3 Round Brisk None       Visual Fields (Counting fingers)      Left Right    Full Full       Extraocular Movement      Right Left    Full Full       Neuro/Psych    Oriented x3: Yes   Mood/Affect: Normal       Dilation    Both eyes: 1.0% Mydriacyl, 2.5% Phenylephrine @ 10:51 AM        Slit Lamp and Fundus Exam    External Exam      Right Left   External Normal Normal       Slit Lamp Exam      Right Left   Lids/Lashes Normal Normal   Conjunctiva/Sclera White and quiet White and quiet   Cornea Clear Clear   Anterior Chamber Deep and quiet Deep and quiet   Iris Round and reactive Round and reactive   Lens Posterior chamber intraocular lens Posterior chamber intraocular lens   Anterior Vitreous Normal Normal       Fundus Exam      Right Left   C/D Ratio 0.35  0.65   Macula Atrophy, Retinal pigment epithelial atrophy,  Geographic atrophy 4 disc areas in size, no hemorrhage viewed today Disciform scar, Geographic atrophy, no exudates, Retinal pigment epithelial mottling          IMAGING AND PROCEDURES  Imaging and Procedures for 04/28/20  OCT, Retina - OU - Both Eyes       Right Eye Quality was good. Scan locations included subfoveal. Central Foveal Thickness: 229. Progression has improved. Findings include abnormal foveal contour.   Left Eye Quality was good. Scan locations included subfoveal. Central Foveal Thickness: 253. Progression has improved. Findings include abnormal foveal contour.   Notes OD much less CME and subretinal fluid after restart intravitreal Avastin May 2021.  OS vastly improved since worsening of wet AMD January 2021.       Intravitreal Injection, Pharmacologic Agent - OD - Right Eye       Time Out 04/28/2020. 11:59 AM. Confirmed correct patient, procedure, site, and patient consented.   Anesthesia Topical anesthesia was used. Anesthetic medications included Akten 3.5%.   Procedure Preparation included Ofloxacin , 10% betadine to eyelids. A 30 gauge needle was used.   Injection:  1.25 mg Bevacizumab (AVASTIN) SOLN   NDC: [address]-7, Lot: [id number]   Route: Intravitreal, Site: Right Eye, Waste: 0 mg  Post-op Post injection exam found visual acuity of at least counting fingers. The patient tolerated the procedure well. There were no complications. The patient received written and verbal post procedure care education. Post injection medications were not given.        Intravitreal Injection, Pharmacologic Agent - OS - Left Eye       Time Out 04/28/2020. 11:59 AM. Confirmed correct patient, procedure, site, and patient consented.   Anesthesia Topical anesthesia was used. Anesthetic medications included Akten 3.5%.   Procedure Preparation included Ofloxacin , 10% betadine to eyelids. A 30 gauge needle was used.   Injection:  1.25 mg Bevacizumab (AVASTIN)  SOLN   NDC: [address]-7, Lot: [id number]   Route: Intravitreal, Site: Left Eye, Waste: 0 mg  Post-op Post injection exam found visual acuity of at least counting fingers. The patient tolerated the procedure well. There were no complications. The patient received written and verbal post procedure care education. Post injection medications were not given.                 ASSESSMENT/PLAN:  Exudative age-related macular degeneration of right eye with active choroidal neovascularization ([place]) New onset wet AMD OD activity OD, May 2021, proved after first intravitreal Avastin OD, repeat injection OD today and exam in 5 weeks  Exudative age-related macular degeneration of left eye with active choroidal neovascularization ([place]) Vastly improved anatomy of visual functioning left eye after recent development of wet ARMD OS January 2021.  Repeat intravitreal Avastin OS today      ICD-10-CM   1. Exudative age-related macular degeneration of right eye with active choroidal neovascularization (HCC)  H35.3211 OCT, Retina - OU - Both Eyes    Intravitreal Injection, Pharmacologic Agent - OD - Right Eye    Bevacizumab (AVASTIN) SOLN 1.25 mg  2. Exudative age-related macular degeneration of right eye with inactive choroidal neovascularization ([place])  H35.3212   3. Exudative age-related macular degeneration of left eye with active choroidal neovascularization (HCC)  H35.3221 OCT, Retina - OU - Both Eyes    Intravitreal Injection, Pharmacologic Agent - OS - Left Eye    Bevacizumab (AVASTIN) SOLN 1.25 mg    1.  2.  3.  Ophthalmic  Meds Ordered this visit:  Meds ordered this encounter  Medications  . Bevacizumab (AVASTIN) SOLN 1.25 mg  . Bevacizumab (AVASTIN) SOLN 1.25 mg       Return in about 5 weeks (around 06/02/2020) for DILATE OU, AVASTIN OCT, OD, OS.  There are no Patient Instructions on file for this visit.   Explained the diagnoses, plan, and follow up with the patient and they  expressed understanding.  Patient expressed understanding of the importance of proper follow up care.   Clent Demark Poetry Cerro M.D. Diseases & Surgery of the Retina and Vitreous Retina [facility] Diabetic [place] 04/28/20     Abbreviations: M myopia (nearsighted); A astigmatism; H hyperopia (farsighted); P presbyopia; Mrx spectacle prescription;  CTL contact lenses; OD right eye; OS left eye; OU both eyes  XT exotropia; ET esotropia; PEK punctate epithelial keratitis; PEE punctate epithelial erosions; DES dry eye syndrome; MGD meibomian gland dysfunction; ATs artificial tears; PFAT's preservative free artificial tears; [place] nuclear sclerotic cataract; PSC posterior subcapsular cataract; ERM epi-retinal membrane; PVD posterior vitreous detachment; RD retinal detachment; DM diabetes mellitus; DR diabetic retinopathy; NPDR non-proliferative diabetic retinopathy; PDR proliferative diabetic retinopathy; CSME clinically significant macular edema; DME diabetic macular edema; dbh dot blot hemorrhages; CWS cotton wool spot; POAG primary open angle glaucoma; C/D cup-to-disc ratio; HVF humphrey visual field; GVF goldmann visual field; OCT optical coherence tomography; IOP intraocular pressure; BRVO Branch retinal vein occlusion; CRVO central retinal vein occlusion; CRAO central retinal artery occlusion; BRAO branch retinal artery occlusion; RT retinal tear; SB scleral buckle; PPV pars plana vitrectomy; VH Vitreous hemorrhage; PRP panretinal laser photocoagulation; IVK intravitreal kenalog; VMT vitreomacular traction; MH Macular hole;  NVD neovascularization of the disc; NVE neovascularization elsewhere; AREDS age related eye disease study; ARMD age related macular degeneration; POAG primary open angle glaucoma; EBMD epithelial/anterior basement membrane dystrophy; ACIOL anterior chamber intraocular lens; IOL intraocular lens; PCIOL posterior chamber intraocular lens; Phaco/IOL phacoemulsification with intraocular lens placement;  [place] photorefractive keratectomy; LASIK laser assisted in situ keratomileusis; HTN hypertension; DM diabetes mellitus; COPD chronic obstructive pulmonary disease

## 2020-05-31 ENCOUNTER — Other Ambulatory Visit: Payer: Self-pay

## 2020-05-31 ENCOUNTER — Ambulatory Visit (INDEPENDENT_AMBULATORY_CARE_PROVIDER_SITE_OTHER): Payer: Medicare HMO | Admitting: Ophthalmology

## 2020-05-31 ENCOUNTER — Encounter (INDEPENDENT_AMBULATORY_CARE_PROVIDER_SITE_OTHER): Payer: Self-pay | Admitting: Ophthalmology

## 2020-05-31 DIAGNOSIS — H353211 Exudative age-related macular degeneration, right eye, with active choroidal neovascularization: Secondary | ICD-10-CM | POA: Diagnosis not present

## 2020-05-31 DIAGNOSIS — H353221 Exudative age-related macular degeneration, left eye, with active choroidal neovascularization: Secondary | ICD-10-CM

## 2020-05-31 DIAGNOSIS — H353231 Exudative age-related macular degeneration, bilateral, with active choroidal neovascularization: Secondary | ICD-10-CM | POA: Diagnosis not present

## 2020-05-31 MED ORDER — BEVACIZUMAB CHEMO INJECTION 1.25MG/0.05ML SYRINGE FOR KALEIDOSCOPE
1.2500 mg | INTRAVITREAL | Status: AC | PRN
  Administered 2020-05-31: 1.25 mg via INTRAVITREAL

## 2020-05-31 NOTE — Assessment & Plan Note (Signed)
Repeat injection of Avastin OS today to control active CNVM at 5-week interval.  Will extend examination to left eye 6 weeks

## 2020-05-31 NOTE — Patient Instructions (Signed)
Patient to contact the office should new onset visual acuity decline or distortion develop in either eye.

## 2020-05-31 NOTE — Assessment & Plan Note (Signed)
OD, inactive CNVM at current 5-week interval from onset compared to May 2020, will need injection today and likely simply observe OD in the future.

## 2020-05-31 NOTE — Progress Notes (Signed)
05/31/2020     CHIEF COMPLAINT Patient presents for Retina Follow Up   HISTORY OF PRESENT ILLNESS: Susan [name] is a 74 y.o. female who presents to the clinic today for:   HPI    Retina Follow Up    Patient presents with  Wet AMD.  In both eyes.  This started 5 weeks ago.  Severity is mild.  Duration of 5 weeks.  Since onset it is stable.          Comments    5 Week AMD F/U OU, poss Avastin OU  Pt reports floaters that come and go OU when tired. Pt sts [facility] OU is doing better.         Last edited by Susan [name], Susan [name] on 05/31/2020  9:54 AM. (History)      Referring physician: Tonia Ghent, MD [place],  [place] [address]  HISTORICAL INFORMATION:   Selected notes from the MEDICAL RECORD NUMBER    Lab Results  Component Value Date   HGBA1C 6.0 02/26/2017     CURRENT MEDICATIONS: No current outpatient medications on file. (Ophthalmic Drugs)   No current facility-administered medications for this visit. (Ophthalmic Drugs)   Current Outpatient Medications (Other)  Medication Sig  . acetaminophen (TYLENOL) 500 MG tablet Take 1,000 mg by mouth 2 (two) times daily.  Marland Kitchen aspirin 81 MG tablet Take 81 mg by mouth daily.  . Cholecalciferol 1000 units tablet Take 2 tablets (2,000 Units total) by mouth daily.  Marland Kitchen ezetimibe (ZETIA) 10 MG tablet Take 1 tablet (10 mg total) by mouth daily.  Marland Kitchen lisinopril-hydrochlorothiazide (ZESTORETIC) 20-25 MG tablet Take 1 tablet by mouth daily.  . meclizine (ANTIVERT) 12.5 MG tablet Take 1 tablet (12.5 mg total) by mouth 3 (three) times daily as needed for dizziness.  . multivitamin-lutein (OCUVITE-LUTEIN) CAPS capsule Take 1 capsule by mouth daily. Taking 1 tablet daily  . vitamin B-12 (CYANOCOBALAMIN) 1000 MCG tablet Take 1,000 mcg by mouth daily.   No current facility-administered medications for this visit. (Other)      REVIEW OF SYSTEMS:    ALLERGIES Allergies  Allergen Reactions  . Lipitor  [Atorvastatin Calcium] Other (See Comments)    Aches on med at 6m a day.   .Marland KitchenPenicillins Swelling and Rash    Has patient had a PCN reaction causing immediate rash, facial/tongue/throat swelling, SOB or lightheadedness with hypotension: Yes Has patient had a PCN reaction causing severe rash involving mucus membranes or skin necrosis: No Has patient had a PCN reaction that required hospitalization No Has patient had a PCN reaction occurring within the last 10 years: No If all of the above answers are "NO", then may proceed with Cephalosporin use.   . Sulfonamide Derivatives Rash    PAST MEDICAL HISTORY Past Medical History:  Diagnosis Date  . Colon cancer screening   . Diverticulosis of colon   . Hyperlipidemia   . Hypertension   . IBS (irritable bowel syndrome)   . 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 Macular degeneration   . Migraine, unspecified, without mention of intractable migraine without mention of status migrainosus   . NASH (nonalcoholic steatohepatitis)   . Nonobstructive atherosclerosis of coronary artery 2016   Cardiac CTA with mild proximal LAD disease; CAC score 74.  .Marland KitchenOcular migraine   . Osteoporosis    on Fosamax for 5 years  . Other abnormal glucose   . Other chronic nonalcoholic liver disease    LFTs normalized with  weight loss 2013  . Other screening mammogram   . Symptomatic menopausal or female climacteric states   . Thyroid disease   . Tubular adenoma of colon   . Urinary tract infection, site not specified    Past Surgical History:  Procedure Laterality Date  . abdominal ultrasound  04/12/2004 & 6/06   positive gallstones  . ANKLE FRACTURE SURGERY  ~2002   pinning  . BTL  30+ years ago  . CATARACT EXTRACTION  2013   B eyes  . COLONOSCOPY  06/08/06   Polyps, divertics (Dr. Sharlett Iles)  . CT abdomen  04/29/04   Positive gallstones  . TONSILLECTOMY AND ADENOIDECTOMY  Age 53  . ultrasound pelvis  6/06   Negative    FAMILY HISTORY Family History    Problem Relation Age of Onset  . Depression Mother        Manic depression  . Diabetes Mother        Type 2  . Breast cancer Mother   . Heart disease Mother   . Colon polyps Father   . Heart attack Father 76  . Breast cancer Maternal Aunt   . Breast cancer Paternal Aunt   . Crohn's disease Brother   . Osteoporosis Brother   . Cancer Neg Hx   . Colon cancer Neg Hx     SOCIAL HISTORY Social History   Tobacco Use  . Smoking status: Former Smoker    Types: Cigarettes    Quit date: 03/19/2001    Years since quitting: 19.2  . Smokeless tobacco: Never Used  . Tobacco comment: Quit in 2001  Vaping Use  . Vaping Use: Never used  Substance Use Topics  . Alcohol use: Not Currently    Comment: 1 glass of wine per month  . Drug use: No    Comment: Uses CBD oil to help calm down         OPHTHALMIC EXAM:  Base Eye Exam    Visual Acuity (ETDRS)      Right Left   Dist cc CF @ 5' 20/25ecc +1   Dist ph cc NI    Correction: Glasses       Tonometry (Tonopen, 9:57 AM)      Right Left   Pressure 11 15       Pupils      Pupils Dark Light Shape React APD   Right PERRL 4 3 Round Brisk None   Left PERRL 4 3 Round Brisk None       Visual Fields (Counting fingers)      Left Right    Full Full       Extraocular Movement      Right Left    Full Full       Neuro/Psych    Oriented x3: Yes   Mood/Affect: Normal       Dilation    Both eyes: 1.0% Mydriacyl, 2.5% Phenylephrine @ 9:57 AM        Slit Lamp and Fundus Exam    External Exam      Right Left   External Normal Normal       Slit Lamp Exam      Right Left   Lids/Lashes Normal Normal   Conjunctiva/Sclera White and quiet White and quiet   Cornea Clear Clear   Anterior Chamber Deep and quiet Deep and quiet   Iris Round and reactive Round and reactive   Lens Posterior chamber intraocular lens Posterior chamber intraocular lens  Anterior Vitreous Normal Normal       Fundus Exam      Right Left   C/D  Ratio 0.5 0.65   Macula Atrophy, Retinal pigment epithelial atrophy, Geographic atrophy 4 disc areas in size, no hemorrhage viewed today, no retinal thickening and no subretinal fluid, no hemorrhage, no exudates, no macular thickening, Geographic atrophy Disciform scar, Geographic atrophy, no exudates, Retinal pigment epithelial mottling          IMAGING AND PROCEDURES  Imaging and Procedures for 05/31/20  OCT, Retina - OU - Both Eyes       Right Eye Central Foveal Thickness: 220. Progression has improved. Findings include no SRF, no IRF, abnormal foveal contour, central retinal atrophy, outer retinal atrophy, subretinal scarring, inner retinal atrophy.   Left Eye Central Foveal Thickness: 252. Progression has been stable. Findings include outer retinal atrophy.   Notes Diffuse thickening of Bruch's membrane with outer retinal atrophy and loss of the photoreceptor layer OD  No obvious CNVM OD.  OS much less disease currently at 5-week interval OS, will repeat injection today and examination OS in 6 weeks       Intravitreal Injection, Pharmacologic Agent - OD - Right Eye       Time Out 05/31/2020. 10:56 AM. Confirmed correct patient, procedure, site, and patient consented.   Anesthesia Topical anesthesia was used. Anesthetic medications included Akten 3.5%.   Procedure Preparation included Tobramycin 0.3%, Ofloxacin , 10% betadine to eyelids, 5% betadine to ocular surface. A 30 gauge needle was used.   Injection:  1.25 mg Bevacizumab (AVASTIN) SOLN   NDC: [address]-0, Lot: [id number]   Route: Intravitreal, Site: Right Eye, Waste: 0 mg  Post-op Post injection exam found visual acuity of at least counting fingers. The patient tolerated the procedure well. There were no complications. The patient received written and verbal post procedure care education. Post injection medications were not given.        Intravitreal Injection, Pharmacologic Agent - OS - Left Eye        Time Out 05/31/2020. 10:57 AM. Confirmed correct patient, procedure, site, and patient consented.   Anesthesia Topical anesthesia was used. Anesthetic medications included Akten 3.5%.   Procedure Preparation included Tobramycin 0.3%, Ofloxacin , 10% betadine to eyelids, 5% betadine to ocular surface. A 30 gauge needle was used.   Injection:  1.25 mg Bevacizumab (AVASTIN) SOLN   NDC: [address]-3   Route: Intravitreal, Site: Left Eye, Waste: 0 mg  Post-op Post injection exam found visual acuity of at least counting fingers. The patient tolerated the procedure well. There were no complications. The patient received written and verbal post procedure care education. Post injection medications were not given.                 ASSESSMENT/PLAN:  Exudative age-related macular degeneration of right eye with active choroidal neovascularization (HCC) OD, inactive CNVM at current 5-week interval from onset compared to May 2020, will need injection today and likely simply observe OD in the future.  Exudative age-related macular degeneration of left eye with active choroidal neovascularization (HCC) Repeat injection of Avastin OS today to control active CNVM at 5-week interval.  Will extend examination to left eye 6 weeks      ICD-10-CM   1. Exudative age-related macular degeneration of right eye with active choroidal neovascularization (HCC)  H35.3211 OCT, Retina - OU - Both Eyes    Intravitreal Injection, Pharmacologic Agent - OD - Right Eye    Bevacizumab (AVASTIN) SOLN 1.25  mg  2. Exudative age-related macular degeneration of left eye with active choroidal neovascularization (HCC)  H35.3221 OCT, Retina - OU - Both Eyes    Intravitreal Injection, Pharmacologic Agent - OS - Left Eye    Bevacizumab (AVASTIN) SOLN 1.25 mg    1.  2.  3.  Ophthalmic Meds Ordered this visit:  Meds ordered this encounter  Medications  . Bevacizumab (AVASTIN) SOLN 1.25 mg  . Bevacizumab  (AVASTIN) SOLN 1.25 mg       Return in about 6 weeks (around 07/12/2020) for dilate, OS, AVASTIN OCT.  Patient Instructions  Patient to contact the office should new onset visual acuity decline or distortion develop in either eye.    Explained the diagnoses, plan, and follow up with the patient and they expressed understanding.  Patient expressed understanding of the importance of proper follow up care.   Clent Demark Isley Zinni M.D. Diseases & Surgery of the Retina and Vitreous [facility] [place] 05/31/20     Abbreviations: M myopia (nearsighted); A astigmatism; H hyperopia (farsighted); P presbyopia; Mrx spectacle prescription;  CTL contact lenses; OD right eye; OS left eye; OU both eyes  XT exotropia; ET esotropia; PEK punctate epithelial keratitis; PEE punctate epithelial erosions; DES dry eye syndrome; MGD meibomian gland dysfunction; ATs artificial tears; PFAT's preservative free artificial tears; [place] nuclear sclerotic cataract; PSC posterior subcapsular cataract; ERM epi-retinal membrane; PVD posterior vitreous detachment; RD retinal detachment; DM diabetes mellitus; DR diabetic retinopathy; NPDR non-proliferative diabetic retinopathy; PDR proliferative diabetic retinopathy; CSME clinically significant macular edema; DME diabetic macular edema; dbh dot blot hemorrhages; CWS cotton wool spot; POAG primary open angle glaucoma; C/D cup-to-disc ratio; HVF humphrey visual field; GVF goldmann visual field; OCT optical coherence tomography; IOP intraocular pressure; BRVO Branch retinal vein occlusion; CRVO central retinal vein occlusion; CRAO central retinal artery occlusion; BRAO branch retinal artery occlusion; RT retinal tear; SB scleral buckle; PPV pars plana vitrectomy; VH Vitreous hemorrhage; PRP panretinal laser photocoagulation; IVK intravitreal kenalog; VMT vitreomacular traction; MH Macular hole;  NVD neovascularization of the disc; NVE neovascularization elsewhere; AREDS age related  eye disease study; ARMD age related macular degeneration; POAG primary open angle glaucoma; EBMD epithelial/anterior basement membrane dystrophy; ACIOL anterior chamber intraocular lens; IOL intraocular lens; PCIOL posterior chamber intraocular lens; Phaco/IOL phacoemulsification with intraocular lens placement; [place] photorefractive keratectomy; LASIK laser assisted in situ keratomileusis; HTN hypertension; DM diabetes mellitus; COPD chronic obstructive pulmonary disease

## 2020-06-02 ENCOUNTER — Encounter (INDEPENDENT_AMBULATORY_CARE_PROVIDER_SITE_OTHER): Payer: Medicare HMO | Admitting: Ophthalmology

## 2020-06-08 ENCOUNTER — Other Ambulatory Visit: Payer: Self-pay

## 2020-06-08 ENCOUNTER — Ambulatory Visit (INDEPENDENT_AMBULATORY_CARE_PROVIDER_SITE_OTHER): Payer: Medicare HMO

## 2020-06-08 DIAGNOSIS — I351 Nonrheumatic aortic (valve) insufficiency: Secondary | ICD-10-CM | POA: Diagnosis not present

## 2020-06-08 DIAGNOSIS — I340 Nonrheumatic mitral (valve) insufficiency: Secondary | ICD-10-CM

## 2020-06-08 LAB — ECHOCARDIOGRAM COMPLETE
AR max vel: 2.03 cm2
AV Area VTI: 1.76 cm2
AV Area mean vel: 1.77 cm2
AV Mean grad: 6 mmHg
AV Peak grad: 10.1 mmHg
Ao pk vel: 1.59 m/s
Area-P 1/2: 3.2 cm2
Calc EF: 59.1 %
P 1/2 time: 389 msec
S' Lateral: 3 cm
Single Plane A2C EF: 57.1 %
Single Plane A4C EF: 60.5 %

## 2020-06-09 ENCOUNTER — Ambulatory Visit (INDEPENDENT_AMBULATORY_CARE_PROVIDER_SITE_OTHER): Payer: Medicare HMO | Admitting: Internal Medicine

## 2020-06-09 ENCOUNTER — Encounter: Payer: Self-pay | Admitting: Internal Medicine

## 2020-06-09 VITALS — BP 130/70 | HR 74 | Ht 61.75 in | Wt 152.8 lb

## 2020-06-09 DIAGNOSIS — I10 Essential (primary) hypertension: Secondary | ICD-10-CM

## 2020-06-09 DIAGNOSIS — I38 Endocarditis, valve unspecified: Secondary | ICD-10-CM | POA: Diagnosis not present

## 2020-06-09 DIAGNOSIS — I2510 Atherosclerotic heart disease of native coronary artery without angina pectoris: Secondary | ICD-10-CM

## 2020-06-09 DIAGNOSIS — R5383 Other fatigue: Secondary | ICD-10-CM | POA: Diagnosis not present

## 2020-06-09 NOTE — Patient Instructions (Signed)
Medication Instructions:  Your physician recommends that you continue on your current medications as directed. Please refer to the Current Medication list given to you today.  *If you need a refill on your cardiac medications before your next appointment, please call your pharmacy*   Lab Work: Your physician recommends that you return for lab work in: TODAY - CBC, CMET, TSH.   If you have labs (blood work) drawn today and your tests are completely normal, you will receive your results only by: Marland Kitchen MyChart Message (if you have MyChart) OR . A paper copy in the mail If you have any lab test that is abnormal or we need to change your treatment, we will call you to review the results.   Testing/Procedures: none  Follow-Up: At [facility], you and your health needs are our priority.  As part of our continuing mission to provide you with exceptional heart care, we have created designated Provider Care Teams.  These Care Teams include your primary Cardiologist (physician) and Advanced Practice Providers (APPs -  Physician Assistants and Nurse Practitioners) who all work together to provide you with the care you need, when you need it.  We recommend signing up for the patient portal called "MyChart".  Sign up information is provided on this After Visit Summary.  MyChart is used to connect with patients for Virtual Visits (Telemedicine).  Patients are able to view lab/test results, encounter notes, upcoming appointments, etc.  Non-urgent messages can be sent to your provider as well.   To learn more about what you can do with MyChart, go to [email or web address].    Your next appointment:   12 month(s)  The format for your next appointment:   In Person  Provider:    You may see DR Harrell Gave END  or one of the following Advanced Practice Providers on your designated Care Team:    Murray Hodgkins, NP  Christell Faith, PA-C  Marrianne Mood, PA-C

## 2020-06-09 NOTE — Progress Notes (Signed)
Follow-up Outpatient Visit Date: 06/09/2020  Primary Care Provider: Tonia Ghent, MD [place] [address]  Chief Complaint: Fatigue  HPI:  Ms. Rau is a 74 y.o. female with history of aortic, mitral, and tricuspid regurgitation,nonobstructive coronary artery disease, hypertension, hyperlipidemia,thyroid disease,NASH, and IBS, who presents for follow-up of valvular heart disease.  I last saw her in early February, at which time she felt well having recovered from COVID-19 a month earlier.  Follow-up echocardiogram performed yesterday showed normal LVEF with grade 1 diastolic dysfunction as well as trivial mitral, mild to moderate aortic, and moderate to severe tricuspid regurgitation.  Today, Ms. Byrns reports she is feeling relatively well other than chronic fatigue.  She states that she gets tired easily, such as cleaning in her kitchen.  She has to sit down for a few minutes to rest and was then able to continue with her normal activities.  She otherwise does not have significant shortness of breath nor chest pain, palpitations, or edema.  She notes occasional lightheadedness, which has improved with increased water intake.  She reports that her home blood pressure is typically less than 130/70.  --------------------------------------------------------------------------------------------------  Cardiovascular History & Procedures: Cardiovascular Problems:  Nonobstructive coronary artery disease  Mitral and tricuspid regurgitation  Risk Factors:  CAD, hypertension, hyperlipidemia, and age > 33  Cath/PCI:  None  CV Surgery:  None  EP Procedures and Devices:  None  Non-Invasive Evaluation(s):  TTE (06/08/2020): Normal LV size and function (EF 60-65%) with grade 1 diastolic dysfunction.  Mild RVH with normal size and contraction.  Mild pulmonary hypertension.  Trivial mitral regurgitation.  Moderate to severe tricuspid regurgitation.   Mild to moderate aortic regurgitation.  Normal CVP.  TTE (10/16/2018, Dr. Humphrey Rolls): Normal LV size with mild LVH. LVEF 70-75%. Mild to moderate mitral and moderate to severe tricuspid regurgitation.  Moderate aortic regurgitation.Mild to moderate pulmonary hypertension. RV size and function.   Trivial pericardial effusion.  Carotid Doppler (10/16/2018, Dr. Humphrey Rolls): Protruding plaque in both carotid bulbs without obstruction. Increased in right mid ICA felt to be due to tortuosity.  Cardiac CTA (09/14/15, Dr. Humphrey Rolls): CAC score 74. Mild proximal LAD disease with haziness. LCx and RCA are normal  Recent CV Pertinent Labs: Lab Results  Component Value Date   CHOL 147 01/28/2020   CHOL 169 10/17/2018   HDL 43 01/28/2020   LDLCALC 75 01/28/2020   LDLCALC 102 10/17/2018   TRIG 145 01/28/2020   TRIG 112 10/17/2018   CHOLHDL 3.4 01/28/2020   K 4.1 04/25/2019   BUN 28 (H) 04/25/2019   CREATININE 0.82 04/25/2019   CREATININE 0.83 10/17/2018   CREATININE 0.77 01/12/2012    Past medical and surgical history were reviewed and updated in EPIC.  Current Meds  Medication Sig  . acetaminophen (TYLENOL) 500 MG tablet Take 1,000 mg by mouth 2 (two) times daily.  Marland Kitchen aspirin 81 MG tablet Take 81 mg by mouth daily.  . Cholecalciferol 1000 units tablet Take 2 tablets (2,000 Units total) by mouth daily.  Marland Kitchen ezetimibe (ZETIA) 10 MG tablet Take 1 tablet (10 mg total) by mouth daily.  Marland Kitchen lisinopril-hydrochlorothiazide (ZESTORETIC) 20-25 MG tablet Take 1 tablet by mouth daily.  . meclizine (ANTIVERT) 12.5 MG tablet Take 1 tablet (12.5 mg total) by mouth 3 (three) times daily as needed for dizziness.  . multivitamin-lutein (OCUVITE-LUTEIN) CAPS capsule Take 1 capsule by mouth daily. Taking 1 tablet daily  . vitamin B-12 (CYANOCOBALAMIN) 1000 MCG tablet Take 1,000 mcg  by mouth daily.    Allergies: Lipitor [atorvastatin calcium], Penicillins, and Sulfonamide derivatives  Social History   Tobacco Use  .  Smoking status: Former Smoker    Types: Cigarettes    Quit date: 03/19/2001    Years since quitting: 19.2  . Smokeless tobacco: Never Used  . Tobacco comment: Quit in 2001  Vaping Use  . Vaping Use: Never used  Substance Use Topics  . Alcohol use: Not Currently    Comment: 1 glass of wine per month  . Drug use: No    Comment: Uses CBD oil to help calm down    Family History  Problem Relation Age of Onset  . Depression Mother        Manic depression  . Diabetes Mother        Type 2  . Breast cancer Mother   . Heart disease Mother   . Colon polyps Father   . Heart attack Father 54  . Breast cancer Maternal Aunt   . Breast cancer Paternal Aunt   . Crohn's disease Brother   . Osteoporosis Brother   . Cancer Neg Hx   . Colon cancer Neg Hx     Review of Systems: A 12-system review of systems was performed and was negative except as noted in the HPI.  --------------------------------------------------------------------------------------------------  Physical Exam: BP 130/70 (BP Location: Left Arm, Patient Position: Sitting, Cuff Size: Normal)   Pulse 74   Ht 5' 1.75" (1.568 m)   Wt 152 lb 12.8 oz (69.3 kg)   SpO2 97%   BMI 28.17 kg/m   General: NAD. Neck: No JVD or HJR. Lungs: Clear to auscultation bilaterally without wheezes or crackles. Heart: Regular rate and rhythm with 2/6 systolic murmur loudest at the right upper sternal border.  No rubs or gallops. Abdomen: Soft, nontender, nondistended. Extremities: No lower extremity edema.  EKG: Normal sinus rhythm with short PR interval and incomplete right bundle branch block.  No significant change from prior tracing on 12/10/2019.  Lab Results  Component Value Date   WBC 4.8 02/26/2017   HGB 14.0 02/26/2017   HCT 42.0 02/26/2017   MCV 91.5 02/26/2017   PLT 188.0 02/26/2017    Lab Results  Component Value Date   NA 139 04/25/2019   K 4.1 04/25/2019   CL 103 04/25/2019   CO2 27 04/25/2019   BUN 28 (H)  04/25/2019   CREATININE 0.82 04/25/2019   GLUCOSE 117 (H) 04/25/2019   ALT 35 01/28/2020    Lab Results  Component Value Date   CHOL 147 01/28/2020   HDL 43 01/28/2020   LDLCALC 75 01/28/2020   TRIG 145 01/28/2020   CHOLHDL 3.4 01/28/2020    --------------------------------------------------------------------------------------------------  ASSESSMENT AND PLAN: Valvular heart disease: Ms. Azua has stable NYHA class II symptoms with recent echocardiogram showing preserved LVEF and unchanged multi valvular regurgitation since 2019.  We will continue her current medications for blood pressure control.  No further intervention at this time.  Nonobstructive coronary artery disease: No symptoms to suggest worsening coronary insufficiency.  EKG today is without acute ischemic changes.  Continue aspirin and ezetimibe to prevent progression of disease, given intolerance of statins in the past.  Hypertension: Blood pressure upper normal today, typically better at home.  I will check a CMP today to ensure stable renal function and electrolytes, with plans to continue her current regimen of lisinopril-HCTZ.  Fatigue: Longstanding and likely multifactorial.  Ms. Jonas notes that she has been under more stress recently  and feels like her energy is better when she is able to sleep well at night.  I will check a CBC, CMP, and TSH today.  If symptoms worsen, we may need to consider repeat ischemia evaluation or even consider invasive testing to better understand the hemodynamics of Ms. Aye's multi-valvular disease.  Follow-up: Return to clinic in 1 year.  Nelva Bush, MD 06/09/2020 1:31 PM

## 2020-06-10 LAB — TSH: TSH: 2.28 u[IU]/mL (ref 0.450–4.500)

## 2020-06-10 LAB — COMPREHENSIVE METABOLIC PANEL
ALT: 29 IU/L (ref 0–32)
AST: 35 IU/L (ref 0–40)
Albumin/Globulin Ratio: 1.7 (ref 1.2–2.2)
Albumin: 4.3 g/dL (ref 3.7–4.7)
Alkaline Phosphatase: 50 IU/L (ref 48–121)
BUN/Creatinine Ratio: 23 (ref 12–28)
BUN: 17 mg/dL (ref 8–27)
Bilirubin Total: 0.4 mg/dL (ref 0.0–1.2)
CO2: 22 mmol/L (ref 20–29)
Calcium: 9.4 mg/dL (ref 8.7–10.3)
Chloride: 104 mmol/L (ref 96–106)
Creatinine, Ser: 0.74 mg/dL (ref 0.57–1.00)
GFR calc Af Amer: 92 mL/min/{1.73_m2} (ref >59)
GFR calc non Af Amer: 80 mL/min/{1.73_m2} (ref >59)
Globulin, Total: 2.6 g/dL (ref 1.5–4.5)
Glucose: 99 mg/dL (ref 65–99)
Potassium: 4.1 mmol/L (ref 3.5–5.2)
Sodium: 140 mmol/L (ref 134–144)
Total Protein: 6.9 g/dL (ref 6.0–8.5)

## 2020-06-10 LAB — CBC
Hematocrit: 38.5 % (ref 34.0–46.6)
Hemoglobin: 13.7 g/dL (ref 11.1–15.9)
MCH: 32.6 pg (ref 26.6–33.0)
MCHC: 35.6 g/dL (ref 31.5–35.7)
MCV: 92 fL (ref 79–97)
Platelets: 188 10*3/uL (ref 150–450)
RBC: 4.2 x10E6/uL (ref 3.77–5.28)
RDW: 12.1 % (ref 11.7–15.4)
WBC: 5.5 10*3/uL (ref 3.4–10.8)

## 2020-07-13 ENCOUNTER — Ambulatory Visit (INDEPENDENT_AMBULATORY_CARE_PROVIDER_SITE_OTHER): Payer: Medicare HMO | Admitting: Ophthalmology

## 2020-07-13 ENCOUNTER — Other Ambulatory Visit: Payer: Self-pay

## 2020-07-13 ENCOUNTER — Encounter (INDEPENDENT_AMBULATORY_CARE_PROVIDER_SITE_OTHER): Payer: Self-pay | Admitting: Ophthalmology

## 2020-07-13 DIAGNOSIS — H353211 Exudative age-related macular degeneration, right eye, with active choroidal neovascularization: Secondary | ICD-10-CM

## 2020-07-13 DIAGNOSIS — H353231 Exudative age-related macular degeneration, bilateral, with active choroidal neovascularization: Secondary | ICD-10-CM

## 2020-07-13 DIAGNOSIS — H353221 Exudative age-related macular degeneration, left eye, with active choroidal neovascularization: Secondary | ICD-10-CM

## 2020-07-13 MED ORDER — BEVACIZUMAB CHEMO INJECTION 1.25MG/0.05ML SYRINGE FOR KALEIDOSCOPE
1.2500 mg | INTRAVITREAL | Status: AC | PRN
  Administered 2020-07-13: 1.25 mg via INTRAVITREAL

## 2020-07-13 NOTE — Assessment & Plan Note (Signed)
OD, stable, no active disease, will observe and monitor for progression of atrophy

## 2020-07-13 NOTE — Patient Instructions (Signed)
Report new onset visual acuity distortions or declines

## 2020-07-13 NOTE — Progress Notes (Signed)
07/13/2020     CHIEF COMPLAINT Patient presents for Retina Follow Up   HISTORY OF PRESENT ILLNESS: Susan [name] is a 74 y.o. 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  This started 6 weeks ago.  Severity is mild.  Duration of 6 weeks.  Since onset it is stable.          Comments    6 Week AMD F/U OS, poss Avastin OS  Pt reports continued floaters OU. Pt denies any new symptoms OU. [facility] stable OU.       Last edited by Rockie Neighbours, [place] on 07/13/2020 10:44 AM. (History)      Referring physician: Tonia Ghent, MD [place],  [place] [address]  HISTORICAL INFORMATION:   Selected notes from the MEDICAL RECORD NUMBER    Lab Results  Component Value Date   HGBA1C 6.0 02/26/2017     CURRENT MEDICATIONS: No current outpatient medications on file. (Ophthalmic Drugs)   No current facility-administered medications for this visit. (Ophthalmic Drugs)   Current Outpatient Medications (Other)  Medication Sig  . acetaminophen (TYLENOL) 500 MG tablet Take 1,000 mg by mouth 2 (two) times daily.  Marland Kitchen aspirin 81 MG tablet Take 81 mg by mouth daily.  . Cholecalciferol 1000 units tablet Take 2 tablets (2,000 Units total) by mouth daily.  Marland Kitchen ezetimibe (ZETIA) 10 MG tablet Take 1 tablet (10 mg total) by mouth daily.  Marland Kitchen lisinopril-hydrochlorothiazide (ZESTORETIC) 20-25 MG tablet Take 1 tablet by mouth daily.  . meclizine (ANTIVERT) 12.5 MG tablet Take 1 tablet (12.5 mg total) by mouth 3 (three) times daily as needed for dizziness.  . multivitamin-lutein (OCUVITE-LUTEIN) CAPS capsule Take 1 capsule by mouth daily. Taking 1 tablet daily  . vitamin B-12 (CYANOCOBALAMIN) 1000 MCG tablet Take 1,000 mcg by mouth daily.   No current facility-administered medications for this visit. (Other)      REVIEW OF SYSTEMS:    ALLERGIES Allergies  Allergen Reactions  . Lipitor [Atorvastatin  Calcium] Other (See Comments)    Aches on med at 59m a day.   .Marland KitchenPenicillins Swelling and Rash    Has patient had a PCN reaction causing immediate rash, facial/tongue/throat swelling, SOB or lightheadedness with hypotension: Yes Has patient had a PCN reaction causing severe rash involving mucus membranes or skin necrosis: No Has patient had a PCN reaction that required hospitalization No Has patient had a PCN reaction occurring within the last 10 years: No If all of the above answers are "NO", then may proceed with Cephalosporin use.   . Sulfonamide Derivatives Rash    PAST MEDICAL HISTORY Past Medical History:  Diagnosis Date  . Colon cancer screening   . Diverticulosis of colon   . Hyperlipidemia   . Hypertension   . IBS (irritable bowel syndrome)   . 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 Macular degeneration   . Migraine, unspecified, without mention of intractable migraine without mention of status migrainosus   . NASH (nonalcoholic steatohepatitis)   . Nonobstructive atherosclerosis of coronary artery 2016   Cardiac CTA with mild proximal LAD disease; CAC score 74.  .Marland KitchenOcular migraine   . Osteoporosis    on Fosamax for 5 years  . Other abnormal glucose   . Other chronic nonalcoholic liver disease    LFTs normalized with weight loss 2013  . Other  screening mammogram   . Symptomatic menopausal or female climacteric states   . Thyroid disease   . Tubular adenoma of colon   . Urinary tract infection, site not specified    Past Surgical History:  Procedure Laterality Date  . abdominal ultrasound  04/12/2004 & 6/06   positive gallstones  . ANKLE FRACTURE SURGERY  ~2002   pinning  . BTL  30+ years ago  . CATARACT EXTRACTION  2013   B eyes  . COLONOSCOPY  06/08/06   Polyps, divertics (Dr. Sharlett Iles)  . CT abdomen  04/29/04   Positive gallstones  . TONSILLECTOMY AND ADENOIDECTOMY  Age 35  . ultrasound pelvis  6/06   Negative    FAMILY HISTORY Family History  Problem Relation  Age of Onset  . Depression Mother        Manic depression  . Diabetes Mother        Type 2  . Breast cancer Mother   . Heart disease Mother   . Colon polyps Father   . Heart attack Father 34  . Breast cancer Maternal Aunt   . Breast cancer Paternal Aunt   . Crohn's disease Brother   . Osteoporosis Brother   . Cancer Neg Hx   . Colon cancer Neg Hx     SOCIAL HISTORY Social History   Tobacco Use  . Smoking status: Former Smoker    Types: Cigarettes    Quit date: 03/19/2001    Years since quitting: 19.3  . Smokeless tobacco: Never Used  . Tobacco comment: Quit in 2001  Vaping Use  . Vaping Use: Never used  Substance Use Topics  . Alcohol use: Not Currently    Comment: 1 glass of wine per month  . Drug use: No    Comment: Uses CBD oil to help calm down         OPHTHALMIC EXAM:  Base Eye Exam    Visual Acuity (ETDRS)      Right Left   Dist cc CF @ 5' 20/25 -2   Dist ph cc NI        Tonometry (Tonopen, 10:46 AM)      Right Left   Pressure 18 17       Pupils      Pupils Dark Light Shape React APD   Right PERRL 4 3 Round Brisk None   Left PERRL 4 3 Round Brisk None       Visual Fields (Counting fingers)      Left Right    Full Full       Extraocular Movement      Right Left    Full Full       Neuro/Psych    Oriented x3: Yes   Mood/Affect: Normal       Dilation    Left eye: 1.0% Mydriacyl, 2.5% Phenylephrine @ 10:49 AM        Slit Lamp and Fundus Exam    External Exam      Right Left   External Normal Normal       Slit Lamp Exam      Right Left   Lids/Lashes Normal Normal   Conjunctiva/Sclera White and quiet White and quiet   Cornea Clear Clear   Anterior Chamber Deep and quiet Deep and quiet   Iris Round and reactive Round and reactive   Lens Posterior chamber intraocular lens Posterior chamber intraocular lens   Anterior Vitreous Normal Normal       Fundus  Exam      Right Left   Posterior Vitreous  Normal   Disc  Normal   C/D  Ratio  0.55   Macula  Disciform scar, Geographic atrophy, no exudates, Retinal pigment epithelial mottling   Vessels  Normal   Periphery  Normal          IMAGING AND PROCEDURES  Imaging and Procedures for 07/13/20  OCT, Retina - OU - Both Eyes       Right Eye Quality was good. Scan locations included subfoveal. Central Foveal Thickness: 214. Progression has improved. Findings include no SRF, no IRF, abnormal foveal contour, central retinal atrophy, outer retinal atrophy, subretinal scarring, inner retinal atrophy.   Left Eye Quality was good. Scan locations included subfoveal. Central Foveal Thickness: 254. Progression has been stable. Findings include outer retinal atrophy, abnormal foveal contour, no SRF.   Notes Diffuse thickening of Bruch's membrane with outer retinal atrophy and loss of the photoreceptor layer OD  No obvious CNVM OD.  OS much less disease currently at 6-week interval OS, will repeat injection today and examination OS in 7 weeks       Intravitreal Injection, Pharmacologic Agent - OS - Left Eye       Time Out 07/13/2020. 11:17 AM. Confirmed correct patient, procedure, site, and patient consented.   Anesthesia Topical anesthesia was used. Anesthetic medications included Akten 3.5%.   Procedure Preparation included Ofloxacin , 10% betadine to eyelids, 5% betadine to ocular surface. A 30 gauge needle was used.   Injection:  1.25 mg Bevacizumab (AVASTIN) SOLN   NDC: [address]-4, Lot: [id number]   Route: Intravitreal, Site: Left Eye, Waste: 0 mg  Post-op Post injection exam found visual acuity of at least counting fingers. The patient tolerated the procedure well. There were no complications. The patient received written and verbal post procedure care education. Post injection medications were not given.                 ASSESSMENT/PLAN:  Exudative age-related macular degeneration of right eye with active choroidal neovascularization (HCC) OD,  stable, no active disease, will observe and monitor for progression of atrophy  Exudative age-related macular degeneration of left eye with active choroidal neovascularization (HCC) OS with excellent PreserVision  Of visual acuity with therapy for CNVM currently at 6-week interval, repeat injection today and examination in 7 weeks      ICD-10-CM   1. Exudative age-related macular degeneration of left eye with active choroidal neovascularization (HCC)  H35.3221 OCT, Retina - OU - Both Eyes    Intravitreal Injection, Pharmacologic Agent - OS - Left Eye    Bevacizumab (AVASTIN) SOLN 1.25 mg  2. Exudative age-related macular degeneration of right eye with active choroidal neovascularization ([place])  H35.3211     1.  2.  3.  Ophthalmic Meds Ordered this visit:  Meds ordered this encounter  Medications  . Bevacizumab (AVASTIN) SOLN 1.25 mg       Return in about 7 weeks (around 08/31/2020) for dilate, OS, AVASTIN OCT.  Patient Instructions  Report new onset visual acuity distortions or declines    Explained the diagnoses, plan, and follow up with the patient and they expressed understanding.  Patient expressed understanding of the importance of proper follow up care.   Clent Demark Saket Hellstrom M.D. Diseases & Surgery of the Retina and Vitreous Retina & Diabetic [place] 07/13/20     Abbreviations: M myopia (nearsighted); A astigmatism; H hyperopia (farsighted); P presbyopia; Mrx spectacle prescription;  CTL contact lenses;  OD right eye; OS left eye; OU both eyes  XT exotropia; ET esotropia; PEK punctate epithelial keratitis; PEE punctate epithelial erosions; DES dry eye syndrome; MGD meibomian gland dysfunction; ATs artificial tears; PFAT's preservative free artificial tears; [place] nuclear sclerotic cataract; PSC posterior subcapsular cataract; ERM epi-retinal membrane; PVD posterior vitreous detachment; RD retinal detachment; DM diabetes mellitus; DR diabetic retinopathy; NPDR  non-proliferative diabetic retinopathy; PDR proliferative diabetic retinopathy; CSME clinically significant macular edema; DME diabetic macular edema; dbh dot blot hemorrhages; CWS cotton wool spot; POAG primary open angle glaucoma; C/D cup-to-disc ratio; HVF humphrey visual field; GVF goldmann visual field; OCT optical coherence tomography; IOP intraocular pressure; BRVO Branch retinal vein occlusion; CRVO central retinal vein occlusion; CRAO central retinal artery occlusion; BRAO branch retinal artery occlusion; RT retinal tear; SB scleral buckle; PPV pars plana vitrectomy; VH Vitreous hemorrhage; PRP panretinal laser photocoagulation; IVK intravitreal kenalog; VMT vitreomacular traction; MH Macular hole;  NVD neovascularization of the disc; NVE neovascularization elsewhere; AREDS age related eye disease study; ARMD age related macular degeneration; POAG primary open angle glaucoma; EBMD epithelial/anterior basement membrane dystrophy; ACIOL anterior chamber intraocular lens; IOL intraocular lens; PCIOL posterior chamber intraocular lens; Phaco/IOL phacoemulsification with intraocular lens placement; [place] photorefractive keratectomy; LASIK laser assisted in situ keratomileusis; HTN hypertension; DM diabetes mellitus; COPD chronic obstructive pulmonary disease

## 2020-07-13 NOTE — Assessment & Plan Note (Signed)
OS with excellent PreserVision  Of visual acuity with therapy for CNVM currently at 6-week interval, repeat injection today and examination in 7 weeks

## 2020-08-30 ENCOUNTER — Other Ambulatory Visit: Payer: Self-pay

## 2020-08-30 ENCOUNTER — Encounter (INDEPENDENT_AMBULATORY_CARE_PROVIDER_SITE_OTHER): Payer: Self-pay | Admitting: Ophthalmology

## 2020-08-30 ENCOUNTER — Ambulatory Visit (INDEPENDENT_AMBULATORY_CARE_PROVIDER_SITE_OTHER): Payer: Medicare HMO | Admitting: Ophthalmology

## 2020-08-30 DIAGNOSIS — H353114 Nonexudative age-related macular degeneration, right eye, advanced atrophic with subfoveal involvement: Secondary | ICD-10-CM

## 2020-08-30 DIAGNOSIS — H353221 Exudative age-related macular degeneration, left eye, with active choroidal neovascularization: Secondary | ICD-10-CM

## 2020-08-30 MED ORDER — BEVACIZUMAB CHEMO INJECTION 1.25MG/0.05ML SYRINGE FOR KALEIDOSCOPE
1.2500 mg | INTRAVITREAL | Status: AC | PRN
  Administered 2020-08-30: 1.25 mg via INTRAVITREAL

## 2020-08-30 NOTE — Assessment & Plan Note (Signed)
OD, accounts for acuity

## 2020-08-30 NOTE — Progress Notes (Signed)
08/30/2020     CHIEF COMPLAINT Patient presents for Retina Follow Up   HISTORY OF PRESENT ILLNESS: Susan [name] is a 74 y.o. 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  This started 7 weeks ago.  Severity is mild.  Duration of 7 weeks.  Since onset it is gradually improving.          Comments    7 Week AMD F/U OS, poss Avastin OS  Pt sts black spot in central [facility] OS has improved. Pt c/o continued intermittent floaters OU.        Last edited by Rockie Neighbours, [place] on 08/30/2020 10:02 AM. (History)      Referring physician: Tonia Ghent, MD [place],  [place] [address]  HISTORICAL INFORMATION:   Selected notes from the MEDICAL RECORD NUMBER    Lab Results  Component Value Date   HGBA1C 6.0 02/26/2017     CURRENT MEDICATIONS: No current outpatient medications on file. (Ophthalmic Drugs)   No current facility-administered medications for this visit. (Ophthalmic Drugs)   Current Outpatient Medications (Other)  Medication Sig  . acetaminophen (TYLENOL) 500 MG tablet Take 1,000 mg by mouth 2 (two) times daily.  Marland Kitchen aspirin 81 MG tablet Take 81 mg by mouth daily.  . Cholecalciferol 1000 units tablet Take 2 tablets (2,000 Units total) by mouth daily.  Marland Kitchen ezetimibe (ZETIA) 10 MG tablet Take 1 tablet (10 mg total) by mouth daily.  Marland Kitchen lisinopril-hydrochlorothiazide (ZESTORETIC) 20-25 MG tablet Take 1 tablet by mouth daily.  . meclizine (ANTIVERT) 12.5 MG tablet Take 1 tablet (12.5 mg total) by mouth 3 (three) times daily as needed for dizziness.  . multivitamin-lutein (OCUVITE-LUTEIN) CAPS capsule Take 1 capsule by mouth daily. Taking 1 tablet daily (Patient not taking: Reported on 08/30/2020)  . vitamin B-12 (CYANOCOBALAMIN) 1000 MCG tablet Take 1,000 mcg by mouth daily.   No current facility-administered medications for this visit. (Other)      REVIEW OF  SYSTEMS:    ALLERGIES Allergies  Allergen Reactions  . Lipitor [Atorvastatin Calcium] Other (See Comments)    Aches on med at 61m a day.   .Marland KitchenPenicillins Swelling and Rash    Has patient had a PCN reaction causing immediate rash, facial/tongue/throat swelling, SOB or lightheadedness with hypotension: Yes Has patient had a PCN reaction causing severe rash involving mucus membranes or skin necrosis: No Has patient had a PCN reaction that required hospitalization No Has patient had a PCN reaction occurring within the last 10 years: No If all of the above answers are "NO", then may proceed with Cephalosporin use.   . Sulfonamide Derivatives Rash    PAST MEDICAL HISTORY Past Medical History:  Diagnosis Date  . Colon cancer screening   . Diverticulosis of colon   . Hyperlipidemia   . Hypertension   . IBS (irritable bowel syndrome)   . 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 Macular degeneration   . Migraine, unspecified, without mention of intractable migraine without mention of status migrainosus   . NASH (nonalcoholic steatohepatitis)   . Nonobstructive atherosclerosis of coronary artery 2016   Cardiac CTA with mild proximal LAD disease; CAC score 74.  .Marland KitchenOcular migraine   . Osteoporosis    on Fosamax for 5 years  . Other abnormal glucose   . Other chronic nonalcoholic liver disease  LFTs normalized with weight loss 2013  . Other screening mammogram   . Symptomatic menopausal or female climacteric states   . Thyroid disease   . Tubular adenoma of colon   . Urinary tract infection, site not specified    Past Surgical History:  Procedure Laterality Date  . abdominal ultrasound  04/12/2004 & 6/06   positive gallstones  . ANKLE FRACTURE SURGERY  ~2002   pinning  . BTL  30+ years ago  . CATARACT EXTRACTION  2013   B eyes  . COLONOSCOPY  06/08/06   Polyps, divertics (Dr. Sharlett Iles)  . CT abdomen  04/29/04   Positive gallstones  . TONSILLECTOMY AND ADENOIDECTOMY  Age 60  .  ultrasound pelvis  6/06   Negative    FAMILY HISTORY Family History  Problem Relation Age of Onset  . Depression Mother        Manic depression  . Diabetes Mother        Type 2  . Breast cancer Mother   . Heart disease Mother   . Colon polyps Father   . Heart attack Father 33  . Breast cancer Maternal Aunt   . Breast cancer Paternal Aunt   . Crohn's disease Brother   . Osteoporosis Brother   . Cancer Neg Hx   . Colon cancer Neg Hx     SOCIAL HISTORY Social History   Tobacco Use  . Smoking status: Former Smoker    Types: Cigarettes    Quit date: 03/19/2001    Years since quitting: 19.4  . Smokeless tobacco: Never Used  . Tobacco comment: Quit in 2001  Vaping Use  . Vaping Use: Never used  Substance Use Topics  . Alcohol use: Not Currently    Comment: 1 glass of wine per month  . Drug use: No    Comment: Uses CBD oil to help calm down         OPHTHALMIC EXAM:  Base Eye Exam    Visual Acuity (ETDRS)      Right Left   Dist cc CF @ 5' 20/25 +1   Dist ph cc NI    Correction: Glasses       Tonometry (Tonopen, 10:03 AM)      Right Left   Pressure 17 15       Pupils      Pupils Dark Light Shape React APD   Right PERRL 4 3 Round Brisk None   Left PERRL 4 3 Round Brisk None       Visual Fields (Counting fingers)      Left Right    Full        Extraocular Movement      Right Left    Full Full       Neuro/Psych    Oriented x3: Yes   Mood/Affect: Normal       Dilation    Left eye: 1.0% Mydriacyl, 2.5% Phenylephrine @ 10:06 AM        Slit Lamp and Fundus Exam    External Exam      Right Left   External Normal Normal       Slit Lamp Exam      Right Left   Lids/Lashes Normal Normal   Conjunctiva/Sclera White and quiet White and quiet   Cornea Clear Clear   Anterior Chamber Deep and quiet Deep and quiet   Iris Round and reactive Round and reactive   Lens Posterior chamber intraocular lens Posterior chamber intraocular lens  Anterior  Vitreous Normal Normal       Fundus Exam      Right Left   Posterior Vitreous  Normal   Disc  Normal   C/D Ratio  0.55   Macula  Disciform scar, Geographic atrophy, no exudates, Retinal pigment epithelial mottling   Vessels  Normal   Periphery  Normal          IMAGING AND PROCEDURES  Imaging and Procedures for 08/30/20  OCT, Retina - OU - Both Eyes       Right Eye Quality was good. Scan locations included subfoveal. Central Foveal Thickness: 210. Findings include central retinal atrophy, inner retinal atrophy, outer retinal atrophy.   Left Eye Quality was good. Scan locations included subfoveal. Central Foveal Thickness: 245. Findings include abnormal foveal contour, no SRF.   Notes OD, no active CNVM, no OS with much less subretinal fluid CME as compared to January 2021.       Intravitreal Injection, Pharmacologic Agent - OS - Left Eye       Time Out 08/30/2020. 11:18 AM. Confirmed correct patient, procedure, site, and patient consented.   Anesthesia Topical anesthesia was used. Anesthetic medications included Akten 3.5%.   Procedure Preparation included Ofloxacin , 10% betadine to eyelids, 5% betadine to ocular surface. A 30 gauge needle was used.   Injection:  1.25 mg Bevacizumab (AVASTIN) SOLN   NDC: 70360-001-02, Lot: [id number]   Route: Intravitreal, Site: Left Eye, Waste: 0 mg  Post-op Post injection exam found visual acuity of at least counting fingers. The patient tolerated the procedure well. There were no complications. The patient received written and verbal post procedure care education. Post injection medications were not given.                 ASSESSMENT/PLAN:  Advanced nonexudative age-related macular degeneration of right eye with subfoveal involvement OD, accounts for acuity      ICD-10-CM   1. Exudative age-related macular degeneration of left eye with active choroidal neovascularization (HCC)  H35.3221 OCT, Retina - OU - Both Eyes     Intravitreal Injection, Pharmacologic Agent - OS - Left Eye    Bevacizumab (AVASTIN) SOLN 1.25 mg  2. Advanced nonexudative age-related macular degeneration of right eye with subfoveal involvement  H35.3114     1.  Vastly improved macular anatomy and visual functioning left eye since onset of new CNVM January 2021 OS.  2.  Stable at 7-week follow-up examination.  Will have patient follow-up in 7 to 8 weeks depending on holiday schedule complex  3.  Ophthalmic Meds Ordered this visit:  Meds ordered this encounter  Medications  . Bevacizumab (AVASTIN) SOLN 1.25 mg       Return in about 7 weeks (around 10/18/2020) for dilate, OS, AVASTIN OCT.  Patient Instructions  Instructed to contact the office promptly if new onset visual acuity declines or distortions    Explained the diagnoses, plan, and follow up with the patient and they expressed understanding.  Patient expressed understanding of the importance of proper follow up care.   Clent Demark Harim Bi M.D. Diseases & Surgery of the Retina and Vitreous Retina & Diabetic [place] 08/30/20     Abbreviations: M myopia (nearsighted); A astigmatism; H hyperopia (farsighted); P presbyopia; Mrx spectacle prescription;  CTL contact lenses; OD right eye; OS left eye; OU both eyes  XT exotropia; ET esotropia; PEK punctate epithelial keratitis; PEE punctate epithelial erosions; DES dry eye syndrome; MGD meibomian gland dysfunction; ATs artificial tears; PFAT's preservative  free artificial tears; [place] nuclear sclerotic cataract; PSC posterior subcapsular cataract; ERM epi-retinal membrane; PVD posterior vitreous detachment; RD retinal detachment; DM diabetes mellitus; DR diabetic retinopathy; NPDR non-proliferative diabetic retinopathy; PDR proliferative diabetic retinopathy; CSME clinically significant macular edema; DME diabetic macular edema; dbh dot blot hemorrhages; CWS cotton wool spot; POAG primary open angle glaucoma; C/D cup-to-disc  ratio; HVF humphrey visual field; GVF goldmann visual field; OCT optical coherence tomography; IOP intraocular pressure; BRVO Branch retinal vein occlusion; CRVO central retinal vein occlusion; CRAO central retinal artery occlusion; BRAO branch retinal artery occlusion; RT retinal tear; SB scleral buckle; PPV pars plana vitrectomy; VH Vitreous hemorrhage; PRP panretinal laser photocoagulation; IVK intravitreal kenalog; VMT vitreomacular traction; MH Macular hole;  NVD neovascularization of the disc; NVE neovascularization elsewhere; AREDS age related eye disease study; ARMD age related macular degeneration; POAG primary open angle glaucoma; EBMD epithelial/anterior basement membrane dystrophy; ACIOL anterior chamber intraocular lens; IOL intraocular lens; PCIOL posterior chamber intraocular lens; Phaco/IOL phacoemulsification with intraocular lens placement; [place] photorefractive keratectomy; LASIK laser assisted in situ keratomileusis; HTN hypertension; DM diabetes mellitus; COPD chronic obstructive pulmonary disease

## 2020-08-30 NOTE — Patient Instructions (Signed)
Instructed to contact the office promptly if new onset visual acuity declines or distortions

## 2020-09-01 ENCOUNTER — Encounter (INDEPENDENT_AMBULATORY_CARE_PROVIDER_SITE_OTHER): Payer: Medicare HMO | Admitting: Ophthalmology

## 2020-09-14 ENCOUNTER — Telehealth: Payer: Self-pay | Admitting: Internal Medicine

## 2020-09-14 NOTE — Telephone Encounter (Signed)
Pharmacist is calling regarding Rosuvatin. Question is if patient should be taking this medication.

## 2020-09-14 NOTE — Telephone Encounter (Signed)
  [facility] pharmacist verbalized understanding that patient was no longer taking Rosuvastatin because she had intolerance to it. Advised pharmacist of Dr Darnelle Bos recommendations from lab work on 01/28/20:  Nelva Bush, MD  01/29/2020 8:02 AM EDT     Please let Ms. [place] know that her LDL has improved and is almost at goal (75; goal < 70). I think it is reasonable to continue her current medications and to work on lifestyle modifications. ALT remains normal.   She verbalized understanding.

## 2020-10-15 ENCOUNTER — Other Ambulatory Visit: Payer: Self-pay

## 2020-10-15 MED ORDER — LISINOPRIL-HYDROCHLOROTHIAZIDE 20-25 MG PO TABS: 1.0000 | ORAL_TABLET | Freq: Every day | ORAL | 3 refills | Status: DC

## 2020-10-18 ENCOUNTER — Other Ambulatory Visit: Payer: Self-pay

## 2020-10-18 ENCOUNTER — Encounter (INDEPENDENT_AMBULATORY_CARE_PROVIDER_SITE_OTHER): Payer: Self-pay | Admitting: Ophthalmology

## 2020-10-18 ENCOUNTER — Ambulatory Visit (INDEPENDENT_AMBULATORY_CARE_PROVIDER_SITE_OTHER): Payer: Medicare HMO | Admitting: Ophthalmology

## 2020-10-18 DIAGNOSIS — H353114 Nonexudative age-related macular degeneration, right eye, advanced atrophic with subfoveal involvement: Secondary | ICD-10-CM

## 2020-10-18 DIAGNOSIS — H353221 Exudative age-related macular degeneration, left eye, with active choroidal neovascularization: Secondary | ICD-10-CM

## 2020-10-18 MED ORDER — BEVACIZUMAB 2.5 MG/0.1ML IZ SOSY
2.5000 mg | PREFILLED_SYRINGE | INTRAVITREAL | Status: AC | PRN
  Administered 2020-10-18: 2.5 mg via INTRAVITREAL

## 2020-10-18 NOTE — Assessment & Plan Note (Signed)
The nature of age--related macular degeneration was discussed with the patient as well as the distinction between dry and wet types. Checking an [place]msler Grid daily with advice to return immediately should a distortion develop, was given to the patient. The patient 's smoking status now and in the past was determined and advice based on the AREDS study was provided regarding the consumption of antioxidant supplements. AREDS 2 vitamin formulation was recommended. Consumption of dark leafy vegetables and fresh fruits of various colors was recommended. Treatment modalities for wet macular degeneration particularly the use of intravitreal injections of anti-blood vessel growth factors was discussed with the patient. Avastin, Lucentis, and Eylea are the available options. On occasion, therapy includes the use of photodynamic therapy and thermal laser. Stressed to the patient do not rub eyes.  Patient was advised to check Amsler Grid daily and return immediately if changes are noted. Instructions on using the grid were given to the patient. All patient questions were answered.

## 2020-10-18 NOTE — Assessment & Plan Note (Signed)
Much less active left eye, at 7-week interval.  We will repeat injection today and examination in 9 weeks

## 2020-10-18 NOTE — Progress Notes (Signed)
10/18/2020     CHIEF COMPLAINT Patient presents for Retina Follow Up (7 Wk FU OS, POSS AVASTIN OS///Pt reports dryness OU that seems to affect vision, no new f/f, no pain or pressure. )   HISTORY OF PRESENT ILLNESS: Susan [name] is a 74 y.o. 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  This started 7 weeks ago.  Severity is mild.  Duration of 7 weeks. Additional comments: 7 Wk FU OS, POSS AVASTIN OS   Pt reports dryness OU that seems to affect vision, no new f/f, no pain or pressure.        Last edited by Nichola Sizer D on 10/18/2020 10:24 AM. (History)      Referring physician: Tonia Ghent, MD [place],  [place] [address]  HISTORICAL INFORMATION:   Selected notes from the MEDICAL RECORD NUMBER    Lab Results  Component Value Date   HGBA1C 6.0 02/26/2017     CURRENT MEDICATIONS: No current outpatient medications on file. (Ophthalmic Drugs)   No current facility-administered medications for this visit. (Ophthalmic Drugs)   Current Outpatient Medications (Other)  Medication Sig   acetaminophen (TYLENOL) 500 MG tablet Take 1,000 mg by mouth 2 (two) times daily.   aspirin 81 MG tablet Take 81 mg by mouth daily.   Cholecalciferol 1000 units tablet Take 2 tablets (2,000 Units total) by mouth daily.   ezetimibe (ZETIA) 10 MG tablet Take 1 tablet (10 mg total) by mouth daily.   lisinopril-hydrochlorothiazide (ZESTORETIC) 20-25 MG tablet Take 1 tablet by mouth daily.   meclizine (ANTIVERT) 12.5 MG tablet Take 1 tablet (12.5 mg total) by mouth 3 (three) times daily as needed for dizziness.   multivitamin-lutein (OCUVITE-LUTEIN) CAPS capsule Take 1 capsule by mouth daily. Taking 1 tablet daily (Patient not taking: Reported on 08/30/2020)   vitamin B-12 (CYANOCOBALAMIN) 1000 MCG tablet Take 1,000 mcg by mouth daily.   No current facility-administered  medications for this visit. (Other)      REVIEW OF SYSTEMS:    ALLERGIES Allergies  Allergen Reactions   Lipitor [Atorvastatin Calcium] Other (See Comments)    Aches on med at 39m a day.    Penicillins Swelling and Rash    Has patient had a PCN reaction causing immediate rash, facial/tongue/throat swelling, SOB or lightheadedness with hypotension: Yes Has patient had a PCN reaction causing severe rash involving mucus membranes or skin necrosis: No Has patient had a PCN reaction that required hospitalization No Has patient had a PCN reaction occurring within the last 10 years: No If all of the above answers are "NO", then may proceed with Cephalosporin use.    Sulfonamide Derivatives Rash    PAST MEDICAL HISTORY Past Medical History:  Diagnosis Date   Colon cancer screening    Diverticulosis of colon    Hyperlipidemia    Hypertension    IBS (irritable bowel syndrome)    Leaky heart valve 10/07/2015   Macular degeneration    Migraine, unspecified, without mention of intractable migraine without mention of status migrainosus    NASH (nonalcoholic steatohepatitis)    Nonobstructive atherosclerosis of coronary artery 2016   Cardiac CTA with mild proximal LAD disease; CAC score 74.   Ocular migraine    Osteoporosis    on Fosamax for 5 years   Other abnormal glucose    Other chronic  nonalcoholic liver disease    LFTs normalized with weight loss 2013   Other screening mammogram    Symptomatic menopausal or female climacteric states    Thyroid disease    Tubular adenoma of colon    Urinary tract infection, site not specified    Past Surgical History:  Procedure Laterality Date   abdominal ultrasound  04/12/2004 & 6/06   positive gallstones   ANKLE FRACTURE SURGERY  ~2002   pinning   BTL  30+ years ago   CATARACT EXTRACTION  2013   B eyes   COLONOSCOPY  06/08/06   Polyps, divertics (Dr. Sharlett Iles)   CT abdomen  04/29/04   Positive gallstones    TONSILLECTOMY AND ADENOIDECTOMY  Age 68   ultrasound pelvis  6/06   Negative    FAMILY HISTORY Family History  Problem Relation Age of Onset   Depression Mother        Manic depression   Diabetes Mother        Type 2   Breast cancer Mother    Heart disease Mother    Colon polyps Father    Heart attack Father 89   Breast cancer Maternal Aunt    Breast cancer Paternal Aunt    Crohn's disease Brother    Osteoporosis Brother    Cancer Neg Hx    Colon cancer Neg Hx     SOCIAL HISTORY Social History   Tobacco Use   Smoking status: Former Smoker    Types: Cigarettes    Quit date: 03/19/2001    Years since quitting: 19.5   Smokeless tobacco: Never Used   Tobacco comment: Quit in 2001  Vaping Use   Vaping Use: Never used  Substance Use Topics   Alcohol use: Not Currently    Comment: 1 glass of wine per month   Drug use: No    Comment: Uses CBD oil to help calm down         OPHTHALMIC EXAM: Base Eye Exam    Visual Acuity (ETDRS)      Right Left   Dist cc CF at 5' 20/25 -3   Dist ph cc NI NI   Correction: Glasses       Tonometry (Tonopen, 10:29 AM)      Right Left   Pressure 13 14       Pupils      Pupils Dark Light Shape React APD   Right PERRL 4 3 Round Brisk None   Left PERRL 4 3 Round Brisk None       Visual Fields (Counting fingers)      Left Right    Full Full       Extraocular Movement      Right Left    Full Full       Neuro/Psych    Oriented x3: Yes   Mood/Affect: Normal       Dilation    Left eye: 1.0% Mydriacyl, 2.5% Phenylephrine @ 10:29 AM        Slit Lamp and Fundus Exam    External Exam      Right Left   External Normal Normal       Slit Lamp Exam      Right Left   Lids/Lashes Normal Normal   Conjunctiva/Sclera White and quiet White and quiet   Cornea Clear Clear   Anterior Chamber Deep and quiet Deep and quiet   Iris Round and reactive Round and reactive   Lens Posterior chamber intraocular  lens Posterior chamber intraocular lens   Anterior Vitreous Normal Normal       Fundus Exam      Right Left   Posterior Vitreous  Normal   Disc  Normal   C/D Ratio  0.55   Macula  Disciform scar, Geographic atrophy, no exudates, Retinal pigment epithelial mottling   Vessels  Normal   Periphery  Normal          IMAGING AND PROCEDURES  Imaging and Procedures for 10/18/20  OCT, Retina - OU - Both Eyes       Right Eye Quality was good. Scan locations included subfoveal. Central Foveal Thickness: 220. Findings include central retinal atrophy, inner retinal atrophy, outer retinal atrophy, abnormal foveal contour, retinal drusen , no IRF, no SRF.   Left Eye Quality was good. Scan locations included subfoveal. Central Foveal Thickness: 252. Findings include abnormal foveal contour, no SRF.   Notes OD, no active CNVM, no OS with much less subretinal fluid CME as compared to January 2021.       Intravitreal Injection, Pharmacologic Agent - OS - Left Eye       Time Out 10/18/2020. 11:41 AM. Confirmed correct patient, procedure, site, and patient consented.   Anesthesia Topical anesthesia was used. Anesthetic medications included Akten 3.5%.   Procedure Preparation included Ofloxacin , 10% betadine to eyelids, 5% betadine to ocular surface. A 30 gauge needle was used.   Injection:  2.5 mg Bevacizumab (AVASTIN) 2.37m/0.1mL SOSY   NDC: 732671-245-80 Lot:: [id number]  Route: Intravitreal, Site: Left Eye  Post-op Post injection exam found visual acuity of at least counting fingers. The patient tolerated the procedure well. There were no complications. The patient received written and verbal post procedure care education. Post injection medications were not given.                 ASSESSMENT/PLAN:  Exudative age-related macular degeneration of left eye with active choroidal neovascularization (HCC) Much less active left eye, at 7-week interval.  We will repeat injection  today and examination in 9 weeks  Advanced nonexudative age-related macular degeneration of right eye with subfoveal involvement The nature of age--related macular degeneration was discussed with the patient as well as the distinction between dry and wet types. Checking an Amsler Grid daily with advice to return immediately should a distortion develop, was given to the patient. The patient 's smoking status now and in the past was determined and advice based on the AREDS study was provided regarding the consumption of antioxidant supplements. AREDS 2 vitamin formulation was recommended. Consumption of dark leafy vegetables and fresh fruits of various colors was recommended. Treatment modalities for wet macular degeneration particularly the use of intravitreal injections of anti-blood vessel growth factors was discussed with the patient. Avastin, Lucentis, and Eylea are the available options. On occasion, therapy includes the use of photodynamic therapy and thermal laser. Stressed to the patient do not rub eyes.  Patient was advised to check Amsler Grid daily and return immediately if changes are noted. Instructions on using the grid were given to the patient. All patient questions were answered.      ICD-10-CM   1. Exudative age-related macular degeneration of left eye with active choroidal neovascularization (HCC)  H35.3221 OCT, Retina - OU - Both Eyes    Intravitreal Injection, Pharmacologic Agent - OS - Left Eye    bevacizumab (AVASTIN) SOSY 2.5 mg  2. Advanced nonexudative age-related macular degeneration of right eye with subfoveal involvement  H35.3114  1.  OS, improved macular findings at 7-week interval.  Area superior to the fovea has resolved into atrophy by clinical and OCT evaluation.  Repeat injection Avastin today  2.  OD, no signs of CNVM recurrence, will continue to observe and monitor  3.  Dilate OU next  Ophthalmic Meds Ordered this visit:  Meds ordered this encounter   Medications   bevacizumab (AVASTIN) SOSY 2.5 mg       Return in about 9 weeks (around 12/20/2020) for DILATE OU, AVASTIN OCT, OS.  There are no Patient Instructions on file for this visit.   Explained the diagnoses, plan, and follow up with the patient and they expressed understanding.  Patient expressed understanding of the importance of proper follow up care.   Clent Demark Refoel Palladino M.D. Diseases & Surgery of the Retina and Vitreous [facility] 10/18/20     Abbreviations: M myopia (nearsighted); A astigmatism; H hyperopia (farsighted); P presbyopia; Mrx spectacle prescription;  CTL contact lenses; OD right eye; OS left eye; OU both eyes  XT exotropia; ET esotropia; PEK punctate epithelial keratitis; PEE punctate epithelial erosions; DES dry eye syndrome; MGD meibomian gland dysfunction; ATs artificial tears; PFAT's preservative free artificial tears; [place] nuclear sclerotic cataract; PSC posterior subcapsular cataract; ERM epi-retinal membrane; PVD posterior vitreous detachment; RD retinal detachment; DM diabetes mellitus; DR diabetic retinopathy; NPDR non-proliferative diabetic retinopathy; PDR proliferative diabetic retinopathy; CSME clinically significant macular edema; DME diabetic macular edema; dbh dot blot hemorrhages; CWS cotton wool spot; POAG primary open angle glaucoma; C/D cup-to-disc ratio; HVF humphrey visual field; GVF goldmann visual field; OCT optical coherence tomography; IOP intraocular pressure; BRVO Branch retinal vein occlusion; CRVO central retinal vein occlusion; CRAO central retinal artery occlusion; BRAO branch retinal artery occlusion; RT retinal tear; SB scleral buckle; PPV pars plana vitrectomy; VH Vitreous hemorrhage; PRP panretinal laser photocoagulation; IVK intravitreal kenalog; VMT vitreomacular traction; MH Macular hole;  NVD neovascularization of the disc; NVE neovascularization elsewhere; AREDS age related eye disease study; ARMD age related  macular degeneration; POAG primary open angle glaucoma; EBMD epithelial/anterior basement membrane dystrophy; ACIOL anterior chamber intraocular lens; IOL intraocular lens; PCIOL posterior chamber intraocular lens; Phaco/IOL phacoemulsification with intraocular lens placement; [place] photorefractive keratectomy; LASIK laser assisted in situ keratomileusis; HTN hypertension; DM diabetes mellitus; COPD chronic obstructive pulmonary disease

## 2020-10-19 ENCOUNTER — Other Ambulatory Visit: Payer: Self-pay

## 2020-10-19 MED ORDER — LISINOPRIL-HYDROCHLOROTHIAZIDE 20-25 MG PO TABS: 1.0000 | ORAL_TABLET | Freq: Every day | ORAL | 3 refills | Status: DC

## 2020-11-13 ENCOUNTER — Encounter: Payer: Self-pay | Admitting: Emergency Medicine

## 2020-11-13 ENCOUNTER — Emergency Department: Payer: Medicare HMO

## 2020-11-13 ENCOUNTER — Other Ambulatory Visit: Payer: Self-pay

## 2020-11-13 ENCOUNTER — Inpatient Hospital Stay
Admission: EM | Admit: 2020-11-13 | Discharge: 2020-11-18 | DRG: 481 | Disposition: A | Discharge status: Skilled Nursing Facility | Payer: Medicare HMO | Attending: Internal Medicine | Admitting: Internal Medicine

## 2020-11-13 DIAGNOSIS — Y92009 Unspecified place in unspecified non-institutional (private) residence as the place of occurrence of the external cause: Secondary | ICD-10-CM | POA: Diagnosis not present

## 2020-11-13 DIAGNOSIS — G43109 Migraine with aura, not intractable, without status migrainosus: Secondary | ICD-10-CM | POA: Diagnosis present

## 2020-11-13 DIAGNOSIS — I4510 Unspecified right bundle-branch block: Secondary | ICD-10-CM | POA: Diagnosis present

## 2020-11-13 DIAGNOSIS — E871 Hypo-osmolality and hyponatremia: Secondary | ICD-10-CM | POA: Diagnosis not present

## 2020-11-13 DIAGNOSIS — E785 Hyperlipidemia, unspecified: Secondary | ICD-10-CM | POA: Diagnosis present

## 2020-11-13 DIAGNOSIS — I951 Orthostatic hypotension: Secondary | ICD-10-CM | POA: Diagnosis not present

## 2020-11-13 DIAGNOSIS — I38 Endocarditis, valve unspecified: Secondary | ICD-10-CM | POA: Diagnosis present

## 2020-11-13 DIAGNOSIS — R5381 Other malaise: Secondary | ICD-10-CM | POA: Diagnosis not present

## 2020-11-13 DIAGNOSIS — Z87440 Personal history of urinary (tract) infections: Secondary | ICD-10-CM | POA: Diagnosis not present

## 2020-11-13 DIAGNOSIS — I5032 Chronic diastolic (congestive) heart failure: Secondary | ICD-10-CM | POA: Diagnosis not present

## 2020-11-13 DIAGNOSIS — S72142A Displaced intertrochanteric fracture of left femur, initial encounter for closed fracture: Secondary | ICD-10-CM | POA: Diagnosis not present

## 2020-11-13 DIAGNOSIS — Z87891 Personal history of nicotine dependence: Secondary | ICD-10-CM | POA: Diagnosis not present

## 2020-11-13 DIAGNOSIS — K529 Noninfective gastroenteritis and colitis, unspecified: Secondary | ICD-10-CM | POA: Diagnosis not present

## 2020-11-13 DIAGNOSIS — K7581 Nonalcoholic steatohepatitis (NASH): Secondary | ICD-10-CM | POA: Diagnosis present

## 2020-11-13 DIAGNOSIS — S79912A Unspecified injury of left hip, initial encounter: Secondary | ICD-10-CM | POA: Diagnosis not present

## 2020-11-13 DIAGNOSIS — Z8262 Family history of osteoporosis: Secondary | ICD-10-CM

## 2020-11-13 DIAGNOSIS — Z79899 Other long term (current) drug therapy: Secondary | ICD-10-CM

## 2020-11-13 DIAGNOSIS — K589 Irritable bowel syndrome without diarrhea: Secondary | ICD-10-CM | POA: Diagnosis present

## 2020-11-13 DIAGNOSIS — M2550 Pain in unspecified joint: Secondary | ICD-10-CM | POA: Diagnosis not present

## 2020-11-13 DIAGNOSIS — M810 Age-related osteoporosis without current pathological fracture: Secondary | ICD-10-CM | POA: Diagnosis not present

## 2020-11-13 DIAGNOSIS — K769 Liver disease, unspecified: Secondary | ICD-10-CM | POA: Diagnosis present

## 2020-11-13 DIAGNOSIS — Z01818 Encounter for other preprocedural examination: Secondary | ICD-10-CM

## 2020-11-13 DIAGNOSIS — K760 Fatty (change of) liver, not elsewhere classified: Secondary | ICD-10-CM | POA: Diagnosis present

## 2020-11-13 DIAGNOSIS — Z96642 Presence of left artificial hip joint: Secondary | ICD-10-CM | POA: Diagnosis not present

## 2020-11-13 DIAGNOSIS — Z880 Allergy status to penicillin: Secondary | ICD-10-CM | POA: Diagnosis not present

## 2020-11-13 DIAGNOSIS — S72002D Fracture of unspecified part of neck of left femur, subsequent encounter for closed fracture with routine healing: Secondary | ICD-10-CM | POA: Diagnosis not present

## 2020-11-13 DIAGNOSIS — R52 Pain, unspecified: Secondary | ICD-10-CM | POA: Diagnosis not present

## 2020-11-13 DIAGNOSIS — E876 Hypokalemia: Secondary | ICD-10-CM | POA: Diagnosis not present

## 2020-11-13 DIAGNOSIS — S72002A Fracture of unspecified part of neck of left femur, initial encounter for closed fracture: Secondary | ICD-10-CM | POA: Diagnosis not present

## 2020-11-13 DIAGNOSIS — H3530 Unspecified macular degeneration: Secondary | ICD-10-CM | POA: Diagnosis present

## 2020-11-13 DIAGNOSIS — N951 Menopausal and female climacteric states: Secondary | ICD-10-CM | POA: Diagnosis present

## 2020-11-13 DIAGNOSIS — Z471 Aftercare following joint replacement surgery: Secondary | ICD-10-CM | POA: Diagnosis not present

## 2020-11-13 DIAGNOSIS — Z7401 Bed confinement status: Secondary | ICD-10-CM | POA: Diagnosis not present

## 2020-11-13 DIAGNOSIS — Z8249 Family history of ischemic heart disease and other diseases of the circulatory system: Secondary | ICD-10-CM

## 2020-11-13 DIAGNOSIS — I110 Hypertensive heart disease with heart failure: Secondary | ICD-10-CM | POA: Diagnosis present

## 2020-11-13 DIAGNOSIS — W19XXXA Unspecified fall, initial encounter: Secondary | ICD-10-CM

## 2020-11-13 DIAGNOSIS — I071 Rheumatic tricuspid insufficiency: Secondary | ICD-10-CM | POA: Diagnosis not present

## 2020-11-13 DIAGNOSIS — Z888 Allergy status to other drugs, medicaments and biological substances status: Secondary | ICD-10-CM

## 2020-11-13 DIAGNOSIS — I10 Essential (primary) hypertension: Secondary | ICD-10-CM | POA: Diagnosis not present

## 2020-11-13 DIAGNOSIS — Z20822 Contact with and (suspected) exposure to covid-19: Secondary | ICD-10-CM | POA: Diagnosis not present

## 2020-11-13 DIAGNOSIS — Z882 Allergy status to sulfonamides status: Secondary | ICD-10-CM

## 2020-11-13 DIAGNOSIS — M25552 Pain in left hip: Secondary | ICD-10-CM | POA: Diagnosis not present

## 2020-11-13 DIAGNOSIS — W010XXA Fall on same level from slipping, tripping and stumbling without subsequent striking against object, initial encounter: Secondary | ICD-10-CM | POA: Diagnosis present

## 2020-11-13 DIAGNOSIS — Z7982 Long term (current) use of aspirin: Secondary | ICD-10-CM

## 2020-11-13 DIAGNOSIS — I25119 Atherosclerotic heart disease of native coronary artery with unspecified angina pectoris: Secondary | ICD-10-CM | POA: Diagnosis present

## 2020-11-13 DIAGNOSIS — I2510 Atherosclerotic heart disease of native coronary artery without angina pectoris: Secondary | ICD-10-CM | POA: Diagnosis present

## 2020-11-13 DIAGNOSIS — T148XXA Other injury of unspecified body region, initial encounter: Secondary | ICD-10-CM

## 2020-11-13 DIAGNOSIS — S72142S Displaced intertrochanteric fracture of left femur, sequela: Secondary | ICD-10-CM | POA: Diagnosis not present

## 2020-11-13 LAB — BASIC METABOLIC PANEL
Anion gap: 12 (ref 5–15)
BUN: 23 mg/dL (ref 8–23)
CO2: 23 mmol/L (ref 22–32)
Calcium: 9.7 mg/dL (ref 8.9–10.3)
Chloride: 103 mmol/L (ref 98–111)
Creatinine, Ser: 0.79 mg/dL (ref 0.44–1.00)
GFR, Estimated: >60 mL/min (ref >60)
Glucose, Bld: 122 mg/dL — ABNORMAL HIGH (ref 70–99)
Potassium: 4 mmol/L (ref 3.5–5.1)
Sodium: 138 mmol/L (ref 135–145)

## 2020-11-13 LAB — CBC WITH DIFFERENTIAL/PLATELET
Abs Immature Granulocytes: 0.04 10*3/uL (ref 0.00–0.07)
Basophils Absolute: 0 10*3/uL (ref 0.0–0.1)
Basophils Relative: 1 %
Eosinophils Absolute: 0.1 10*3/uL (ref 0.0–0.5)
Eosinophils Relative: 1 %
HCT: 39.3 % (ref 36.0–46.0)
Hemoglobin: 13.1 g/dL (ref 12.0–15.0)
Immature Granulocytes: 1 %
Lymphocytes Relative: 29 %
Lymphs Abs: 2.3 10*3/uL (ref 0.7–4.0)
MCH: 31 pg (ref 26.0–34.0)
MCHC: 33.3 g/dL (ref 30.0–36.0)
MCV: 93.1 fL (ref 80.0–100.0)
Monocytes Absolute: 0.5 10*3/uL (ref 0.1–1.0)
Monocytes Relative: 6 %
Neutro Abs: 5 10*3/uL (ref 1.7–7.7)
Neutrophils Relative %: 62 %
Platelets: 174 10*3/uL (ref 150–400)
RBC: 4.22 MIL/uL (ref 3.87–5.11)
RDW: 13.3 % (ref 11.5–15.5)
WBC: 8 10*3/uL (ref 4.0–10.5)
nRBC: 0 % (ref 0.0–0.2)

## 2020-11-13 LAB — TYPE AND SCREEN
ABO/RH(D): A POS
Antibody Screen: NEGATIVE

## 2020-11-13 LAB — PROTIME-INR
INR: 1.1 (ref 0.8–1.2)
Prothrombin Time: 13.3 seconds (ref 11.4–15.2)

## 2020-11-13 LAB — RESP PANEL BY RT-PCR (FLU A&B, COVID) ARPGX2
Influenza A by PCR: NEGATIVE
Influenza B by PCR: NEGATIVE
SARS Coronavirus 2 by RT PCR: NEGATIVE

## 2020-11-13 LAB — APTT: aPTT: 29 seconds (ref 24–36)

## 2020-11-13 MED ORDER — BISACODYL 5 MG PO TBEC: 5.0000 mg | DELAYED_RELEASE_TABLET | Freq: Every day | ORAL | Status: DC | PRN

## 2020-11-13 MED ORDER — ONDANSETRON HCL 4 MG/2ML IJ SOLN
4.0000 mg | Freq: Once | INTRAMUSCULAR | Status: AC
  Administered 2020-11-13: 4 mg via INTRAVENOUS
  Filled 2020-11-13: qty 2

## 2020-11-13 MED ORDER — MORPHINE SULFATE (PF) 2 MG/ML IV SOLN
0.5000 mg | INTRAVENOUS | Status: DC | PRN
  Administered 2020-11-14 – 2020-11-15 (×4): 0.5 mg via INTRAVENOUS
  Filled 2020-11-13 (×4): qty 1

## 2020-11-13 MED ORDER — CLINDAMYCIN PHOSPHATE 600 MG/50ML IV SOLN
600.0000 mg | INTRAVENOUS | Status: AC
  Administered 2020-11-14: 600 mg via INTRAVENOUS
  Filled 2020-11-13: qty 50

## 2020-11-13 MED ORDER — HYDROCODONE-ACETAMINOPHEN 5-325 MG PO TABS
1.0000 | ORAL_TABLET | Freq: Four times a day (QID) | ORAL | Status: DC | PRN
  Administered 2020-11-14 – 2020-11-18 (×8): 2 via ORAL
  Filled 2020-11-13: qty 2
  Filled 2020-11-13: qty 1
  Filled 2020-11-13 (×2): qty 2
  Filled 2020-11-13: qty 1
  Filled 2020-11-13 (×5): qty 2

## 2020-11-13 MED ORDER — HYDROCHLOROTHIAZIDE 25 MG PO TABS
25.0000 mg | ORAL_TABLET | Freq: Every day | ORAL | Status: DC
  Administered 2020-11-15: 25 mg via ORAL
  Filled 2020-11-13: qty 1

## 2020-11-13 MED ORDER — SODIUM CHLORIDE 0.9 % IV SOLN: INTRAVENOUS | Status: DC

## 2020-11-13 MED ORDER — CHLORHEXIDINE GLUCONATE 4 % EX LIQD
1.0000 "application " | Freq: Once | CUTANEOUS | Status: AC
  Administered 2020-11-14: 1 via TOPICAL

## 2020-11-13 MED ORDER — EZETIMIBE 10 MG PO TABS
10.0000 mg | ORAL_TABLET | Freq: Every day | ORAL | Status: DC
  Administered 2020-11-15 – 2020-11-18 (×4): 10 mg via ORAL
  Filled 2020-11-13 (×6): qty 1

## 2020-11-13 MED ORDER — LISINOPRIL 20 MG PO TABS
20.0000 mg | ORAL_TABLET | Freq: Every day | ORAL | Status: DC
  Administered 2020-11-15: 20 mg via ORAL
  Filled 2020-11-13: qty 1

## 2020-11-13 MED ORDER — DOXYCYCLINE HYCLATE 100 MG IV SOLR
100.0000 mg | Freq: Once | INTRAVENOUS | Status: DC
Start: 2020-11-14 — End: 2020-11-13

## 2020-11-13 MED ORDER — MORPHINE SULFATE (PF) 4 MG/ML IV SOLN
4.0000 mg | Freq: Once | INTRAVENOUS | Status: AC
  Administered 2020-11-13: 4 mg via INTRAVENOUS
  Filled 2020-11-13: qty 1

## 2020-11-13 MED ORDER — ASPIRIN EC 81 MG PO TBEC: 81.0000 mg | DELAYED_RELEASE_TABLET | Freq: Every day | ORAL | Status: DC

## 2020-11-13 MED ORDER — LISINOPRIL-HYDROCHLOROTHIAZIDE 20-25 MG PO TABS: 1.0000 | ORAL_TABLET | Freq: Every day | ORAL | Status: DC

## 2020-11-13 NOTE — H&P (Signed)
History and Physical    Susan [name]:[id number] DOB: February 05, 1946 DOA: 11/13/2020  PCP: Susan Nam, MD   Patient coming from: Home  I have personally briefly reviewed patient's old medical records in [facility]  Chief Complaint: Fall, left hip pain  HPI: Susan [name] is a 75 y.o. female with medical history significant for HTN, nonobstructive CAD, grade 1 diastolic dysfunction on echo 06/08/2020, valvular heart disease with moderate to severe tricuspid regurg, osteoporosis, IBS, NASH, who presents to the emergency room after suffering an accidental fall after becoming entangled while taking down Christmas tree, falling onto her left hip with sudden onset pain.  She did not hit her head, no loss of consciousness.  Patient was in her usual state of health previously and denies preceding symptoms such as lightheadedness, dizziness, visual disturbance, one-sided numbness weakness or tingling, chest pain or shortness of breath.  She denies cough, fever or chills. ED Course: On arrival in the emergency room vitals were within normal limits.  Blood work unremarkable. EKG as reviewed by me : NSR at 94 with incomplete RBBB.  No acute ST-T wave changes Imaging: Chest x-ray clear  Review of Systems: As per HPI otherwise all other systems on review of systems negative.    Past Medical History:  Diagnosis Date  . Colon cancer screening   . Diverticulosis of colon   . Hyperlipidemia   . Hypertension   . IBS (irritable bowel syndrome)   . 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 Macular degeneration   . Migraine, unspecified, without mention of intractable migraine without mention of status migrainosus   . NASH (nonalcoholic steatohepatitis)   . Nonobstructive atherosclerosis of coronary artery 2016   Cardiac CTA with mild proximal LAD disease; CAC score 74.  Marland Kitchen Ocular migraine   . Osteoporosis    on Fosamax for 5 years  . Other abnormal glucose   . Other chronic nonalcoholic liver  disease    LFTs normalized with weight loss 2013  . Other screening mammogram   . Symptomatic menopausal or female climacteric states   . Thyroid disease   . Tubular adenoma of colon   . Urinary tract infection, site not specified     Past Surgical History:  Procedure Laterality Date  . abdominal ultrasound  04/12/2004 & 6/06   positive gallstones  . ANKLE FRACTURE SURGERY  ~2002   pinning  . BTL  30+ years ago  . CATARACT EXTRACTION  2013   B eyes  . COLONOSCOPY  06/08/06   Polyps, divertics (Dr. Jarold Motto)  . CT abdomen  04/29/04   Positive gallstones  . TONSILLECTOMY AND ADENOIDECTOMY  Age 59  . ultrasound pelvis  6/06   Negative     reports that she quit smoking about 19 years ago. Her smoking use included cigarettes. She has never used smokeless tobacco. She reports previous alcohol use. She reports that she does not use drugs.  Allergies  Allergen Reactions  . Lipitor [Atorvastatin Calcium] Other (See Comments)    Aches on med at 20mg  a day.   Marland Kitchen Penicillins Swelling and Rash    Has patient had a PCN reaction causing immediate rash, facial/tongue/throat swelling, SOB or lightheadedness with hypotension: Yes Has patient had a PCN reaction causing severe rash involving mucus membranes or skin necrosis: No Has patient had a PCN reaction that required hospitalization No Has patient had a PCN reaction occurring within the last 10 years: No If all of the above answers are "NO",  then may proceed with Cephalosporin use.   . Sulfonamide Derivatives Rash    Family History  Problem Relation Age of Onset  . Depression Mother        Manic depression  . Diabetes Mother        Type 2  . Breast cancer Mother   . Heart disease Mother   . Colon polyps Father   . Heart attack Father 48  . Breast cancer Maternal Aunt   . Breast cancer Paternal Aunt   . Crohn's disease Brother   . Osteoporosis Brother   . Cancer Neg Hx   . Colon cancer Neg Hx       Prior to Admission  medications   Medication Sig Start Date End Date Taking? Authorizing Provider  acetaminophen (TYLENOL) 500 MG tablet Take 1,000 mg by mouth 2 (two) times daily.    [provider]  aspirin 81 MG tablet Take 81 mg by mouth daily.    [provider]  Cholecalciferol 1000 units tablet Take 2 tablets (2,000 Units total) by mouth daily. 01/31/18   Susan Nam, MD  ezetimibe (ZETIA) 10 MG tablet Take 1 tablet (10 mg total) by mouth daily. 02/09/20 06/09/20  End, Cristal Deer, MD  lisinopril-hydrochlorothiazide (ZESTORETIC) 20-25 MG tablet Take 1 tablet by mouth daily. 10/19/20   End, Cristal Deer, MD  meclizine (ANTIVERT) 12.5 MG tablet Take 1 tablet (12.5 mg total) by mouth 3 (three) times daily as needed for dizziness. 08/04/16   Gayla Doss, MD  multivitamin-lutein [facility]) CAPS capsule Take 1 capsule by mouth daily. Taking 1 tablet daily Patient not taking: Reported on 08/30/2020    [provider]  vitamin B-12 (CYANOCOBALAMIN) 1000 MCG tablet Take 1,000 mcg by mouth daily.    [provider]    Physical Exam: Vitals:   11/13/20 2126  BP: (!) 143/73  Pulse: 93  Resp: 18  Temp: 98.5 F (36.9 C)  TempSrc: Oral  SpO2: 97%  Weight: 66.7 kg  Height: 5\' 1"  (1.549 m)     Vitals:   11/13/20 2126  BP: (!) 143/73  Pulse: 93  Resp: 18  Temp: 98.5 F (36.9 C)  TempSrc: Oral  SpO2: 97%  Weight: 66.7 kg  Height: 5\' 1"  (1.549 m)      Constitutional: Alert and oriented x 3 . Not in any apparent distress HEENT:      Head: Normocephalic and atraumatic.         Eyes: PERLA, EOMI, Conjunctivae are normal. Sclera is non-icteric.       Mouth/Throat: Mucous membranes are moist.       Neck: Supple with no signs of meningismus. Cardiovascular: Regular rate and rhythm. No murmurs, gallops, or rubs. 2+ symmetrical distal pulses are present . No JVD. No LE edema Respiratory: Respiratory effort normal .Lungs sounds clear bilaterally. No wheezes,  crackles, or rhonchi.  Gastrointestinal: Soft, non tender, and non distended with positive bowel sounds.  Genitourinary: No CVA tenderness. Musculoskeletal: Tenderness on palpation of left hip.  Left leg shortened and and external rotationNo cyanosis, or erythema of extremities. Neurologic:  Face is symmetric. Moving all extremities. No gross focal neurologic deficits . Skin: Skin is warm, dry.  No rash or ulcers Psychiatric: Mood and affect are normal    Labs on Admission: I have personally reviewed following labs and imaging studies  CBC: Recent Labs  Lab 11/13/20 2130  WBC 8.0  NEUTROABS 5.0  HGB 13.1  HCT 39.3  MCV 93.1  PLT 174  Basic Metabolic Panel: Recent Labs  Lab 11/13/20 2130  NA 138  K 4.0  CL 103  CO2 23  GLUCOSE 122*  BUN 23  CREATININE 0.79  CALCIUM 9.7   GFR: Estimated Creatinine Clearance: 53.1 mL/min (by C-G formula based on SCr of 0.79 mg/dL). Liver Function Tests: No results for input(s): AST, ALT, ALKPHOS, BILITOT, PROT, ALBUMIN in the last 168 hours. No results for input(s): LIPASE, AMYLASE in the last 168 hours. No results for input(s): AMMONIA in the last 168 hours. Coagulation Profile: Recent Labs  Lab 11/13/20 2130  INR 1.1   Cardiac Enzymes: No results for input(s): CKTOTAL, CKMB, CKMBINDEX, TROPONINI in the last 168 hours. BNP (last 3 results) No results for input(s): PROBNP in the last 8760 hours. HbA1C: No results for input(s): HGBA1C in the last 72 hours. CBG: No results for input(s): GLUCAP in the last 168 hours. Lipid Profile: No results for input(s): CHOL, HDL, LDLCALC, TRIG, CHOLHDL, LDLDIRECT in the last 72 hours. Thyroid Function Tests: No results for input(s): TSH, T4TOTAL, FREET4, T3FREE, THYROIDAB in the last 72 hours. Anemia Panel: No results for input(s): VITAMINB12, FOLATE, FERRITIN, TIBC, IRON, RETICCTPCT in the last 72 hours. Urine analysis:    Component Value Date/Time   COLORURINE YELLOW (A) 08/04/2016  0927   APPEARANCEUR HAZY (A) 08/04/2016 0927   LABSPEC 1.015 08/04/2016 0927   PHURINE 5.0 08/04/2016 0927   GLUCOSEU NEGATIVE 08/04/2016 0927   HGBUR NEGATIVE 08/04/2016 0927   HGBUR trace-intact 09/10/2009 1409   BILIRUBINUR Neg 08/07/2016 1426   KETONESUR NEGATIVE 08/04/2016 0927   PROTEINUR Neg 08/07/2016 1426   PROTEINUR NEGATIVE 08/04/2016 0927   UROBILINOGEN 0.2 08/07/2016 1426   UROBILINOGEN 0.2 09/10/2009 1409   NITRITE Neg 08/07/2016 1426   NITRITE POSITIVE (A) 08/04/2016 0927   LEUKOCYTESUR Negative 08/07/2016 1426    Radiological Exams on Admission: DG Chest 1 View  Result Date: 11/13/2020 CLINICAL DATA:  Mechanical fall at home with left hip pain. EXAM: CHEST  1 VIEW COMPARISON:  Chest radiograph 08/30/2015 FINDINGS: The cardiomediastinal contours are normal. Stable slight rightward tracheal deviation from prior. Pulmonary vasculature is normal. Minimal scarring in the lingula. No consolidation, pleural effusion, or pneumothorax. No acute osseous abnormalities are seen. IMPRESSION: No acute chest finding. Electronically Signed   By: Narda Rutherford M.D.   On: 11/13/2020 22:24   DG Hip Unilat With Pelvis 2-3 Views Left  Result Date: 11/13/2020 CLINICAL DATA:  75 year old female with fall and left hip pain. EXAM: DG HIP (WITH OR WITHOUT PELVIS) 2-3V LEFT COMPARISON:  None. FINDINGS: There is a comminuted and mildly angulated intertrochanteric fracture the left femur with extension of the fracture into the subtrochanteric femur. No dislocation. The bones are osteopenic. Mild bilateral hip osteoarthritic changes the soft tissues are unremarkable. IMPRESSION: Comminuted and mildly angulated left femoral neck fracture. Electronically Signed   By: Elgie Collard M.D.   On: 11/13/2020 22:25     Assessment/Plan 75 year old functionally independent female with history of HTN, nonobstructive CAD, grade 1 diastolic dysfunction on echo 06/08/2020, valvular heart disease with moderate to  severe tricuspid regurg, osteoporosis, IBS, NASH, presenting with accidental fall.    Closed comminuted intertrochanteric fracture of left femur (HCC)   Accidental fall   Preoperative clearance - Accidental fall resulting in left femur fracture - Dr. Hyacinth Meeker to take patient to the OR at around 2 PM - Patient at low risk for perioperative cardiopulmonary events.     CAD in native artery   Valvular heart disease,  moderate to severe tricuspid regurgitation   Chronic Grade I diastolic CHF on echocardiogram 06/08/20 (HCC) - Overall stable and chronic - Patient follows with cardiologist, Dr. Okey Dupre, last seen 06/09/2020 - Had echocardiogram on 06/08/2020 that showed valvular heart disease, grade 1 diastolic dysfunction, moderate to severe tricuspid regurg and other mild valvular disease - Continue aspirin, Zetia    Essential hypertension - Continue lisinopril HCTZ    Osteoporosis - Stable    Irritable bowel syndrome - Stable    Non-alcoholic fatty liver disease - Stable.  No acute concerns    DVT prophylaxis: SCDs Code Status: full code  Family Communication:  none  Disposition Plan: Back to previous home environment Consults called: Dr. Hyacinth Meeker Status:At the time of admission, it appears that the appropriate admission status for this patient is INPATIENT. This is judged to be reasonable and necessary in order to provide the required intensity of service to ensure the patient's safety given the presenting symptoms, physical exam findings, and initial radiographic and laboratory data in the context of their  Comorbid conditions.   Patient requires inpatient status due to high intensity of service, high risk for further deterioration and high frequency of surveillance required.   I certify that at the point of admission it is my clinical judgment that the patient will require inpatient hospital care spanning beyond 2 midnights     Andris Baumann MD [facility]     11/13/2020, 10:55  PM

## 2020-11-13 NOTE — ED Provider Notes (Signed)
[facility] Emergency Department Provider Note  ____________________________________________  Time seen: Approximately 9:56 PM  I have reviewed the triage vital signs and the nursing notes.   HISTORY  Chief Complaint Hip Pain    HPI Susan [name] is a 75 y.o. female with a past history of hypertension diverticulosis osteoporosis who was in her usual state of health today until she had a mechanical fall.  She was taking down Christmas decorations to put them away when she got tangled up and fell onto her left hip causing immediate severe pain, unable to bear weight, worse with movement, no alleviating factors.  Constant.  Denies any other injuries.  No head injury or neck pain, no blood thinner use.  No loss of consciousness.      Past Medical History:  Diagnosis Date  . Colon cancer screening   . Diverticulosis of colon   . Hyperlipidemia   . Hypertension   . IBS (irritable bowel syndrome)   . 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 Macular degeneration   . Migraine, unspecified, without mention of intractable migraine without mention of status migrainosus   . NASH (nonalcoholic steatohepatitis)   . Nonobstructive atherosclerosis of coronary artery 2016   Cardiac CTA with mild proximal LAD disease; CAC score 74.  Marland Kitchen Ocular migraine   . Osteoporosis    on Fosamax for 5 years  . Other abnormal glucose   . Other chronic nonalcoholic liver disease    LFTs normalized with weight loss 2013  . Other screening mammogram   . Symptomatic menopausal or female climacteric states   . Thyroid disease   . Tubular adenoma of colon   . Urinary tract infection, site not specified      Patient Active Problem List   Diagnosis Date Noted  . Chronic Grade I diastolic CHF on echocardiogram 06/08/20 ([place]) 11/13/2020  . Closed comminuted intertrochanteric fracture of left femur ([place]) 11/13/2020  . Accidental fall 11/13/2020  . Preoperative clearance 11/13/2020  . Closed  left hip fracture, initial encounter ([place]) 11/13/2020  . Valvular heart disease 06/09/2020  . Exudative age-related macular degeneration of right eye with active choroidal neovascularization ([place]) 03/23/2020  . Exudative age-related macular degeneration of left eye with active choroidal neovascularization ([place]) 02/10/2020  . Exudative age-related macular degeneration of right eye with inactive choroidal neovascularization ([place]) 02/10/2020  . Posterior vitreous detachment of right eye 02/10/2020  . Posterior vitreous detachment of left eye 02/10/2020  . Advanced nonexudative age-related macular degeneration of right eye with subfoveal involvement 02/10/2020  . Mitral valve insufficiency 12/12/2019  . Aortic valve regurgitation 12/12/2019  . CAD in native artery 09/05/2019  . Carotid artery plaque, bilateral 09/05/2019  . Right shoulder pain 08/20/2019  . Health care maintenance 02/03/2018  . Hand numbness 04/10/2017  . Numbness and tingling of foot 04/10/2017  . Carpal tunnel syndrome of right wrist 04/10/2017  . Neuropathy 02/27/2017  . HLD (hyperlipidemia) 07/23/2016  . Cystocele 05/19/2015  . Dupuytren contracture 03/24/2015  . Advance care planning 03/24/2015  . Benign paroxysmal positional vertigo 11/09/2014  . Anxiety state 11/09/2014  . Fatigue 08/27/2014  . Multiple joint pain 07/09/2014  . Medicare annual wellness visit, initial 08/25/2013  . Abdominal pain, other specified site 08/25/2013  . Multiple thyroid nodules 08/06/2013  . Bilateral carotid artery stenosis 05/22/2013  . Memory changes 08/27/2012  . Lipoma 05/05/2011  . Disordered eating 05/05/2011  . B12 deficiency 11/18/2010  . Irritable bowel syndrome 11/11/2010  . DIVERTICULOSIS, COLON 11/04/2010  .  Non-alcoholic fatty liver disease 11/04/2010  . COLONIC POLYPS, HX OF 11/04/2010  . Hyperglycemia 08/12/2010  . MIGRAINE HEADACHE 12/27/2007  . Essential hypertension 12/27/2007  . MENOPAUSAL SYNDROME 12/27/2007   . Osteoporosis 11/05/2007  . LFT elevation 05/06/2006     Past Surgical History:  Procedure Laterality Date  . abdominal ultrasound  04/12/2004 & 6/06   positive gallstones  . ANKLE FRACTURE SURGERY  ~2002   pinning  . BTL  30+ years ago  . CATARACT EXTRACTION  2013   B eyes  . COLONOSCOPY  06/08/06   Polyps, divertics (Dr. Sharlett Iles)  . CT abdomen  04/29/04   Positive gallstones  . TONSILLECTOMY AND ADENOIDECTOMY  Age 17  . ultrasound pelvis  6/06   Negative     Prior to Admission medications   Medication Sig Start Date End Date Taking? Authorizing Provider  acetaminophen (TYLENOL) 500 MG tablet Take 1,000 mg by mouth 2 (two) times daily.    [provider]  aspirin 81 MG tablet Take 81 mg by mouth daily.    [provider]  Cholecalciferol 1000 units tablet Take 2 tablets (2,000 Units total) by mouth daily. 01/31/18   Tonia Ghent, MD  ezetimibe (ZETIA) 10 MG tablet Take 1 tablet (10 mg total) by mouth daily. 02/09/20 06/09/20  End, Harrell Gave, MD  lisinopril-hydrochlorothiazide (ZESTORETIC) 20-25 MG tablet Take 1 tablet by mouth daily. 10/19/20   End, Harrell Gave, MD  meclizine (ANTIVERT) 12.5 MG tablet Take 1 tablet (12.5 mg total) by mouth 3 (three) times daily as needed for dizziness. 08/04/16   Joanne Gavel, MD  multivitamin-lutein [facility]) CAPS capsule Take 1 capsule by mouth daily. Taking 1 tablet daily Patient not taking: Reported on 08/30/2020    [provider]  vitamin B-12 (CYANOCOBALAMIN) 1000 MCG tablet Take 1,000 mcg by mouth daily.    [provider]     Allergies Lipitor [atorvastatin calcium], Penicillins, and Sulfonamide derivatives   Family History  Problem Relation Age of Onset  . Depression Mother        Manic depression  . Diabetes Mother        Type 2  . Breast cancer Mother   . Heart disease Mother   . Colon polyps Father   . Heart attack Father 74  . Breast cancer Maternal Aunt   . Breast cancer  Paternal Aunt   . Crohn's disease Brother   . Osteoporosis Brother   . Cancer Neg Hx   . Colon cancer Neg Hx     Social History Social History   Tobacco Use  . Smoking status: Former Smoker    Types: Cigarettes    Quit date: 03/19/2001    Years since quitting: 19.6  . Smokeless tobacco: Never Used  . Tobacco comment: Quit in 2001  Vaping Use  . Vaping Use: Never used  Substance Use Topics  . Alcohol use: Not Currently    Comment: 1 glass of wine per month  . Drug use: No    Comment: Uses CBD oil to help calm down    Review of Systems  Constitutional:   No fever or chills.  ENT:   No sore throat. No rhinorrhea. Cardiovascular:   No chest pain or syncope. Respiratory:   No dyspnea or cough. Gastrointestinal:   Negative for abdominal pain, vomiting and diarrhea.  Musculoskeletal: Left hip pain as above All other systems reviewed and are negative except as documented above in ROS and HPI.  ____________________________________________   PHYSICAL  EXAM:  VITAL SIGNS: ED Triage Vitals [11/13/20 2126]  Enc Vitals Group     BP (!) 143/73     Pulse Rate 93     Resp 18     Temp 98.5 F (36.9 C)     Temp Source Oral     SpO2 97 %     Weight 147 lb (66.7 kg)     Height 5' 1"  (1.549 m)     Head Circumference      Peak Flow      Pain Score 5     Pain Loc      Pain Edu?      Excl. in [place]?     Vital signs reviewed, nursing assessments reviewed.   Constitutional:   Alert and oriented. Non-toxic appearance. Eyes:   Conjunctivae are normal. EOMI. PERRL. ENT      Head:   Normocephalic and atraumatic.      Nose:   Wearing a mask.      Mouth/Throat:   Wearing a mask.      Neck:   No meningismus. Full ROM. Hematological/Lymphatic/Immunilogical:   No cervical lymphadenopathy. Cardiovascular:   RRR. Symmetric bilateral radial and DP pulses.  No murmurs. Cap refill less than 2 seconds. Respiratory:   Normal respiratory effort without tachypnea/retractions. Breath sounds are  clear and equal bilaterally. No wheezes/rales/rhonchi. Gastrointestinal:   Soft and nontender. Non distended. There is no CVA tenderness.  No rebound, rigidity, or guarding.  Musculoskeletal:   Tenderness of the left hip.  There is shortening of the left lower extremity. Neurologic:   Normal speech and language.  Motor grossly intact. No acute focal neurologic deficits are appreciated.  Skin:    Skin is warm, dry and intact. No rash noted.  No petechiae, purpura, or bullae.  ____________________________________________    LABS (pertinent positives/negatives) (all labs ordered are listed, but only abnormal results are displayed) Labs Reviewed  BASIC METABOLIC PANEL - Abnormal; Notable for the following components:      Result Value   Glucose, Bld 122 (*)    All other components within normal limits  RESP PANEL BY RT-PCR (FLU A&B, COVID) ARPGX2  CBC WITH DIFFERENTIAL/PLATELET  PROTIME-INR  APTT  URINALYSIS, ROUTINE W REFLEX MICROSCOPIC  TYPE AND SCREEN   ____________________________________________   EKG    ____________________________________________    RADIOLOGY  DG Chest 1 View  Result Date: 11/13/2020 CLINICAL DATA:  Mechanical fall at home with left hip pain. EXAM: CHEST  1 VIEW COMPARISON:  Chest radiograph 08/30/2015 FINDINGS: The cardiomediastinal contours are normal. Stable slight rightward tracheal deviation from prior. Pulmonary vasculature is normal. Minimal scarring in the lingula. No consolidation, pleural effusion, or pneumothorax. No acute osseous abnormalities are seen. IMPRESSION: No acute chest finding. Electronically Signed   By: Keith Rake M.D.   On: 11/13/2020 22:24   DG Hip Unilat With Pelvis 2-3 Views Left  Result Date: 11/13/2020 CLINICAL DATA:  75 year old female with fall and left hip pain. EXAM: DG HIP (WITH OR WITHOUT PELVIS) 2-3V LEFT COMPARISON:  None. FINDINGS: There is a comminuted and mildly angulated intertrochanteric fracture the left  femur with extension of the fracture into the subtrochanteric femur. No dislocation. The bones are osteopenic. Mild bilateral hip osteoarthritic changes the soft tissues are unremarkable. IMPRESSION: Comminuted and mildly angulated left femoral neck fracture. Electronically Signed   By: Anner Crete M.D.   On: 11/13/2020 22:25    ____________________________________________   PROCEDURES Procedures  ____________________________________________    CLINICAL IMPRESSION /  ASSESSMENT AND PLAN / ED COURSE  Medications ordered in the ED: Medications  0.9 %  sodium chloride infusion (has no administration in time range)  chlorhexidine (HIBICLENS) 4 % liquid 1 application (has no administration in time range)  clindamycin (CLEOCIN) IVPB 600 mg (has no administration in time range)  morphine 4 MG/ML injection 4 mg (4 mg Intravenous Given 11/13/20 2234)  ondansetron (ZOFRAN) injection 4 mg (4 mg Intravenous Given 11/13/20 2233)    Pertinent labs & imaging results that were available during my care of the patient were reviewed by me and considered in my medical decision making (see chart for details).  Susan [name] was evaluated in Emergency Department on 11/13/2020 for the symptoms described in the history of present illness. She was evaluated in the context of the global COVID-19 pandemic, which necessitated consideration that the patient might be at risk for infection with the SARS-CoV-2 virus that causes COVID-19. Institutional protocols and algorithms that pertain to the evaluation of patients at risk for COVID-19 are in a state of rapid change based on information released by regulatory bodies including the CDC and federal and state organizations. These policies and algorithms were followed during the patient's care in the ED.   Patient presents with clinically apparent left hip fracture.  Will get x-ray of the hip and chest, preop labs.  Clinical Course as of 11/13/20 2250  Sat Nov 13, 2020  2234 X-rays viewed and interpreted by me, shows a left intertrochanteric fracture.  Reviewed radiology report as well.  Findings discussed with orthopedics Dr. Sabra Heck who will plan to do surgery tomorrow, most likely in the afternoon based on OR scheduling.  Patient and daughter at bedside updated.  Will contact hospitalist. [PS]    Clinical Course User Index [PS] Carrie Mew, MD     ____________________________________________   FINAL CLINICAL IMPRESSION(S) / ED DIAGNOSES    Final diagnoses:  Closed left hip fracture, initial encounter [facility])     ED Discharge Orders    None      Portions of this note were generated with dragon dictation software. Dictation errors may occur despite best attempts at proofreading.   Carrie Mew, MD 11/13/20 2251

## 2020-11-13 NOTE — ED Triage Notes (Signed)
Patient brought in by ems from home. Patient had a mechanical fall. Patient with complaint of left hip pain. Patient with rotation to left hip. Patient positive pedal pulses.

## 2020-11-14 ENCOUNTER — Inpatient Hospital Stay: Payer: Medicare HMO

## 2020-11-14 ENCOUNTER — Inpatient Hospital Stay: Payer: Medicare HMO | Admitting: Anesthesiology

## 2020-11-14 ENCOUNTER — Encounter
Admission: EM | Disposition: A | Discharge status: Skilled Nursing Facility | Payer: Self-pay | Source: Home / Self Care | Attending: Internal Medicine

## 2020-11-14 DIAGNOSIS — S72142S Displaced intertrochanteric fracture of left femur, sequela: Secondary | ICD-10-CM

## 2020-11-14 DIAGNOSIS — M810 Age-related osteoporosis without current pathological fracture: Secondary | ICD-10-CM

## 2020-11-14 DIAGNOSIS — K529 Noninfective gastroenteritis and colitis, unspecified: Secondary | ICD-10-CM

## 2020-11-14 DIAGNOSIS — I10 Essential (primary) hypertension: Secondary | ICD-10-CM

## 2020-11-14 DIAGNOSIS — I071 Rheumatic tricuspid insufficiency: Secondary | ICD-10-CM

## 2020-11-14 DIAGNOSIS — K7581 Nonalcoholic steatohepatitis (NASH): Secondary | ICD-10-CM

## 2020-11-14 DIAGNOSIS — E785 Hyperlipidemia, unspecified: Secondary | ICD-10-CM

## 2020-11-14 HISTORY — PX: INTRAMEDULLARY (IM) NAIL INTERTROCHANTERIC: SHX5875

## 2020-11-14 LAB — BASIC METABOLIC PANEL
Anion gap: 11 (ref 5–15)
BUN: 23 mg/dL (ref 8–23)
CO2: 23 mmol/L (ref 22–32)
Calcium: 9 mg/dL (ref 8.9–10.3)
Chloride: 103 mmol/L (ref 98–111)
Creatinine, Ser: 0.72 mg/dL (ref 0.44–1.00)
GFR, Estimated: >60 mL/min (ref >60)
Glucose, Bld: 172 mg/dL — ABNORMAL HIGH (ref 70–99)
Potassium: 4 mmol/L (ref 3.5–5.1)
Sodium: 137 mmol/L (ref 135–145)

## 2020-11-14 LAB — CBC
HCT: 36 % (ref 36.0–46.0)
Hemoglobin: 12.1 g/dL (ref 12.0–15.0)
MCH: 31.2 pg (ref 26.0–34.0)
MCHC: 33.6 g/dL (ref 30.0–36.0)
MCV: 92.8 fL (ref 80.0–100.0)
Platelets: 166 10*3/uL (ref 150–400)
RBC: 3.88 MIL/uL (ref 3.87–5.11)
RDW: 13.2 % (ref 11.5–15.5)
WBC: 9.8 10*3/uL (ref 4.0–10.5)
nRBC: 0 % (ref 0.0–0.2)

## 2020-11-14 SURGERY — FIXATION, FRACTURE, INTERTROCHANTERIC, WITH INTRAMEDULLARY ROD
Anesthesia: Spinal | Site: Hip | Laterality: Left

## 2020-11-14 MED ORDER — PROPOFOL 500 MG/50ML IV EMUL
INTRAVENOUS | Status: AC
  Filled 2020-11-14: qty 50

## 2020-11-14 MED ORDER — PROPOFOL 10 MG/ML IV BOLUS
INTRAVENOUS | Status: DC | PRN
  Administered 2020-11-14: 30 mg via INTRAVENOUS

## 2020-11-14 MED ORDER — ASPIRIN EC 81 MG PO TBEC
81.0000 mg | DELAYED_RELEASE_TABLET | Freq: Every day | ORAL | Status: DC
  Administered 2020-11-15 – 2020-11-18 (×4): 81 mg via ORAL
  Filled 2020-11-14 (×4): qty 1

## 2020-11-14 MED ORDER — ONDANSETRON HCL 4 MG/2ML IJ SOLN: 4.0000 mg | Freq: Once | INTRAMUSCULAR | Status: DC | PRN

## 2020-11-14 MED ORDER — OXYCODONE HCL 5 MG/5ML PO SOLN: 5.0000 mg | Freq: Once | ORAL | Status: DC | PRN

## 2020-11-14 MED ORDER — MIDAZOLAM HCL 5 MG/5ML IJ SOLN
INTRAMUSCULAR | Status: DC | PRN
  Administered 2020-11-14: 2 mg via INTRAVENOUS

## 2020-11-14 MED ORDER — ONDANSETRON HCL 4 MG/2ML IJ SOLN
4.0000 mg | Freq: Four times a day (QID) | INTRAMUSCULAR | Status: DC | PRN
  Administered 2020-11-14 – 2020-11-15 (×2): 4 mg via INTRAVENOUS
  Filled 2020-11-14 (×2): qty 2

## 2020-11-14 MED ORDER — ACETAMINOPHEN 10 MG/ML IV SOLN
1000.0000 mg | Freq: Once | INTRAVENOUS | Status: DC | PRN
Start: 2020-11-14 — End: 2020-11-14

## 2020-11-14 MED ORDER — CEFAZOLIN SODIUM-DEXTROSE 2-3 GM-%(50ML) IV SOLR
INTRAVENOUS | Status: DC | PRN
  Administered 2020-11-14: 2 g via INTRAVENOUS

## 2020-11-14 MED ORDER — LACTATED RINGERS IV SOLN: INTRAVENOUS | Status: DC | PRN

## 2020-11-14 MED ORDER — FENTANYL CITRATE (PF) 100 MCG/2ML IJ SOLN: 25.0000 ug | INTRAMUSCULAR | Status: DC | PRN

## 2020-11-14 MED ORDER — BUPIVACAINE HCL (PF) 0.5 % IJ SOLN
INTRAMUSCULAR | Status: DC | PRN
  Administered 2020-11-14: 2.5 mL

## 2020-11-14 MED ORDER — BUPIVACAINE-EPINEPHRINE (PF) 0.25% -1:200000 IJ SOLN
INTRAMUSCULAR | Status: DC | PRN
  Administered 2020-11-14: 30 mL via PERINEURAL

## 2020-11-14 MED ORDER — MIDAZOLAM HCL 2 MG/2ML IJ SOLN
INTRAMUSCULAR | Status: AC
  Filled 2020-11-14: qty 2

## 2020-11-14 MED ORDER — OXYCODONE HCL 5 MG PO TABS: 5.0000 mg | ORAL_TABLET | Freq: Once | ORAL | Status: DC | PRN

## 2020-11-14 MED ORDER — PROPOFOL 500 MG/50ML IV EMUL
INTRAVENOUS | Status: DC | PRN
  Administered 2020-11-14: 100 ug/kg/min via INTRAVENOUS

## 2020-11-14 MED ORDER — SODIUM CHLORIDE 0.9 % IV SOLN
INTRAVENOUS | Status: DC | PRN
  Administered 2020-11-14: 50 ug/min via INTRAVENOUS

## 2020-11-14 SURGICAL SUPPLY — 42 items
APL PRP STRL LF DISP 70% ISPRP (MISCELLANEOUS) ×2
BIT DRILL CALIBRATED 4.2 (BIT) IMPLANT
BLADE TFNA HELICAL 95 NON STRL (Anchor) ×1 IMPLANT
BNDG COHESIVE 4X5 TAN STRL (GAUZE/BANDAGES/DRESSINGS) ×2 IMPLANT
CANISTER SUCT 1200ML W/VALVE (MISCELLANEOUS) ×2 IMPLANT
CHLORAPREP W/TINT 26 (MISCELLANEOUS) ×4 IMPLANT
COVER WAND RF STERILE (DRAPES) ×2 IMPLANT
DRAPE INCISE 23X17 IOBAN STRL (DRAPES) ×1
DRAPE INCISE 23X17 STRL (DRAPES) ×1 IMPLANT
DRAPE INCISE IOBAN 23X17 STRL (DRAPES) ×1 IMPLANT
DRILL BIT CALIBRATED 4.2 (BIT) ×2
DRSG AQUACEL AG ADV 3.5X10 (GAUZE/BANDAGES/DRESSINGS) ×1 IMPLANT
ELECT REM PT RETURN 9FT ADLT (ELECTROSURGICAL) ×2
ELECTRODE REM PT RTRN 9FT ADLT (ELECTROSURGICAL) ×1 IMPLANT
GAUZE SPONGE 4X4 12PLY STRL (GAUZE/BANDAGES/DRESSINGS) ×2 IMPLANT
GAUZE XEROFORM 1X8 LF (GAUZE/BANDAGES/DRESSINGS) ×1 IMPLANT
GLOVE INDICATOR 8.0 STRL GRN (GLOVE) ×2 IMPLANT
GLOVE SURG ORTHO 8.5 STRL (GLOVE) ×2 IMPLANT
GOWN STRL REUS W/ TWL LRG LVL3 (GOWN DISPOSABLE) ×1 IMPLANT
GOWN STRL REUS W/TWL LRG LVL3 (GOWN DISPOSABLE) ×2
GOWN STRL REUS W/TWL LRG LVL4 (GOWN DISPOSABLE) ×2 IMPLANT
GUIDEWIRE 3.2X400 (WIRE) ×2 IMPLANT
KIT TURNOVER KIT A (KITS) ×2 IMPLANT
MANIFOLD NEPTUNE II (INSTRUMENTS) ×2 IMPLANT
MAT ABSORB  FLUID 56X50 GRAY (MISCELLANEOUS) ×2
MAT ABSORB FLUID 56X50 GRAY (MISCELLANEOUS) ×1 IMPLANT
NAIL TROCH FIX 10X170 130 (Nail) ×1 IMPLANT
NDL SPNL 18GX3.5 QUINCKE PK (NEEDLE) ×1 IMPLANT
NEEDLE SPNL 18GX3.5 QUINCKE PK (NEEDLE) ×2 IMPLANT
NS IRRIG 500ML POUR BTL (IV SOLUTION) ×2 IMPLANT
PACK HIP COMPR (MISCELLANEOUS) ×2 IMPLANT
PAD ABD DERMACEA PRESS 5X9 (GAUZE/BANDAGES/DRESSINGS) ×1 IMPLANT
SCREW LOCK STAR 5X34 (Screw) ×1 IMPLANT
SOL PREP PVP 2OZ (MISCELLANEOUS) ×2
SOLUTION PREP PVP 2OZ (MISCELLANEOUS) ×1 IMPLANT
STAPLER SKIN PROX 35W (STAPLE) ×2 IMPLANT
SUCTION FRAZIER HANDLE 10FR (MISCELLANEOUS) ×2
SUCTION TUBE FRAZIER 10FR DISP (MISCELLANEOUS) ×1 IMPLANT
SUT VIC AB 0 CT1 36 (SUTURE) ×2 IMPLANT
SUT VIC AB 2-0 CT1 27 (SUTURE) ×2
SUT VIC AB 2-0 CT1 TAPERPNT 27 (SUTURE) ×1 IMPLANT
SYR 30ML LL (SYRINGE) ×2 IMPLANT

## 2020-11-14 NOTE — Anesthesia Procedure Notes (Signed)
Spinal  Patient location during procedure: OR Start time: 11/14/2020 2:36 PM End time: 11/14/2020 2:45 PM Staffing Performed: resident/CRNA  Resident/CRNA: Nelda Marseille, CRNA Preanesthetic Checklist Completed: patient identified, IV checked, site marked, risks and benefits discussed, surgical consent, monitors and equipment checked, pre-op evaluation and timeout performed Spinal Block Patient position: sitting Prep: Betadine Patient monitoring: heart rate, continuous pulse ox, blood pressure and cardiac monitor Approach: midline Location: L3-4 Injection technique: single-shot Needle Needle type: [place]acre and Introducer  Needle gauge: 25 G Needle length: 9 cm Assessment Sensory level: T10 Additional Notes Negative paresthesia. Negative blood return. Positive free-flowing CSF. Expiration date of kit checked and confirmed. Patient tolerated procedure well, without complications.

## 2020-11-14 NOTE — Consult Note (Signed)
ORTHOPAEDIC CONSULTATION  REQUESTING PHYSICIAN: Loletha Grayer, MD  Chief Complaint: Left hip pain  HPI: Susan [name] is a 75 y.o. female who complains of left hip pain after tripping in her living room while taking down Christmas decorations.  She was brought to the emergency room where exam and x-rays revealed a comminuted intertrochanteric fracture of the left hip.  She has been admitted for medical stabilization of medical evaluation and surgical fixation of the fracture.  I discussed fracture treatment with the patient and her husband today.  Risk and benefits of surgery versus nonsurgery were discussed.  Postop protocol was discussed as well.  At their request we will schedule her for surgery later today.  She is generally healthy with mild cardiac disease.  She has been cleared for surgery by the medical service.  Past Medical History:  Diagnosis Date  . Colon cancer screening   . Diverticulosis of colon   . Hyperlipidemia   . Hypertension   . IBS (irritable bowel syndrome)   . 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 Macular degeneration   . Migraine, unspecified, without mention of intractable migraine without mention of status migrainosus   . NASH (nonalcoholic steatohepatitis)   . Nonobstructive atherosclerosis of coronary artery 2016   Cardiac CTA with mild proximal LAD disease; CAC score 74.  Marland Kitchen Ocular migraine   . Osteoporosis    on Fosamax for 5 years  . Other abnormal glucose   . Other chronic nonalcoholic liver disease    LFTs normalized with weight loss 2013  . Other screening mammogram   . Symptomatic menopausal or female climacteric states   . Thyroid disease   . Tubular adenoma of colon   . Urinary tract infection, site not specified    Past Surgical History:  Procedure Laterality Date  . abdominal ultrasound  04/12/2004 & 6/06   positive gallstones  . ANKLE FRACTURE SURGERY  ~2002   pinning  . BTL  30+ years ago  . CATARACT EXTRACTION  2013   B eyes  .  COLONOSCOPY  06/08/06   Polyps, divertics (Dr. Sharlett Iles)  . CT abdomen  04/29/04   Positive gallstones  . TONSILLECTOMY AND ADENOIDECTOMY  Age 73  . ultrasound pelvis  6/06   Negative   Social History   Socioeconomic History  . Marital status: Married    Spouse name: Not on file  . Number of children: 2  . Years of education: 2  . Highest education level: Not on file  Occupational History  . 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 Smoking status: Former Smoker    Types: Cigarettes    Quit date: 03/19/2001    Years since quitting: 19.6  . Smokeless tobacco: Never Used  . Tobacco comment: Quit in 2001  Vaping Use  . Vaping Use: Never used  Substance and Sexual Activity  . Alcohol use: Not Currently    Comment: 1 glass of wine per month  . Drug use: No    Comment: Uses CBD oil to help calm down  . Sexual activity: Not Currently  Other Topics Concern  . Not on file  Social History Narrative   From [place]. Lives with husband, Enjoys times with 5 grandkids, 1 great grandchild   Social Determinants of Health   Financial Resource Strain: Not on file  Food Insecurity: Not on file  Transportation Needs: Not on file  Physical Activity: Not on file  Stress: Not  on file  Social Connections: Not on file   Family History  Problem Relation Age of Onset  . Depression Mother        Manic depression  . Diabetes Mother        Type 2  . Breast cancer Mother   . Heart disease Mother   . Colon polyps Father   . Heart attack Father 31  . Breast cancer Maternal Aunt   . Breast cancer Paternal Aunt   . Crohn's disease Brother   . Osteoporosis Brother   . Cancer Neg Hx   . Colon cancer Neg Hx    Allergies  Allergen Reactions  . Lipitor [Atorvastatin Calcium] Other (See Comments)    Aches on med at 29m a day.   .Marland KitchenPenicillins Swelling and Rash    Has patient had a PCN reaction causing immediate rash, facial/tongue/throat  swelling, SOB or lightheadedness with hypotension: Yes Has patient had a PCN reaction causing severe rash involving mucus membranes or skin necrosis: No Has patient had a PCN reaction that required hospitalization No Has patient had a PCN reaction occurring within the last 10 years: No If all of the above answers are "NO", then may proceed with Cephalosporin use.   . Sulfonamide Derivatives Rash   Prior to Admission medications   Medication Sig Start Date End Date Taking? Authorizing Provider  acetaminophen (TYLENOL) 500 MG tablet Take 1,000 mg by mouth 2 (two) times daily.    [provider]  aspirin 81 MG tablet Take 81 mg by mouth daily.    [provider]  Cholecalciferol 1000 units tablet Take 2 tablets (2,000 Units total) by mouth daily. 01/31/18   DTonia Ghent MD  ezetimibe (ZETIA) 10 MG tablet Take 1 tablet (10 mg total) by mouth daily. 02/09/20 06/09/20  End, CHarrell Gave MD  lisinopril-hydrochlorothiazide (ZESTORETIC) 20-25 MG tablet Take 1 tablet by mouth daily. 10/19/20   End, CHarrell Gave MD  meclizine (ANTIVERT) 12.5 MG tablet Take 1 tablet (12.5 mg total) by mouth 3 (three) times daily as needed for dizziness. 08/04/16   GJoanne Gavel MD  multivitamin-lutein ([facility] CAPS capsule Take 1 capsule by mouth daily. Taking 1 tablet daily Patient not taking: Reported on 08/30/2020    [provider]  vitamin B-12 (CYANOCOBALAMIN) 1000 MCG tablet Take 1,000 mcg by mouth daily.    [provider]   DG Chest 1 View  Result Date: 11/13/2020 CLINICAL DATA:  Mechanical fall at home with left hip pain. EXAM: CHEST  1 VIEW COMPARISON:  Chest radiograph 08/30/2015 FINDINGS: The cardiomediastinal contours are normal. Stable slight rightward tracheal deviation from prior. Pulmonary vasculature is normal. Minimal scarring in the lingula. No consolidation, pleural effusion, or pneumothorax. No acute osseous abnormalities are seen. IMPRESSION: No acute  chest finding. Electronically Signed   By: MKeith RakeM.D.   On: 11/13/2020 22:24   DG Hip Unilat With Pelvis 2-3 Views Left  Result Date: 11/13/2020 CLINICAL DATA:  75year old female with fall and left hip pain. EXAM: DG HIP (WITH OR WITHOUT PELVIS) 2-3V LEFT COMPARISON:  None. FINDINGS: There is a comminuted and mildly angulated intertrochanteric fracture the left femur with extension of the fracture into the subtrochanteric femur. No dislocation. The bones are osteopenic. Mild bilateral hip osteoarthritic changes the soft tissues are unremarkable. IMPRESSION: Comminuted and mildly angulated left femoral neck fracture. Electronically Signed   By: AAnner CreteM.D.   On: 11/13/2020 22:25    Positive ROS: All other systems have been  reviewed and were otherwise negative with the exception of those mentioned in the HPI and as above.  Physical Exam: General: Alert, no acute distress Cardiovascular: No pedal edema Respiratory: No cyanosis, no use of accessory musculature GI: No organomegaly, abdomen is soft and non-tender Skin: No lesions in the area of chief complaint Neurologic: Sensation intact distally Psychiatric: Patient is competent for consent with normal mood and affect Lymphatic: No axillary or cervical lymphadenopathy  MUSCULOSKELETAL: Patient is alert and lying quietly in bed.  Left leg is shortened and x-ray rotated.  There is pain with any attempts at movement.  Neurovascular status is good.  The skin is intact.  Right leg is normal.  Both lower extremities normal.  She does complain of some low back pain as well.  Palpation of the lower back is difficult and mildly painful.  Neurovascular status is good in both lower extremities.  Assessment: Comminuted, displaced intertrochanteric fracture left hip Low back pain  Plan: Left hip nailing later today    Park Breed, MD [phone]   11/14/2020 10:43 AM

## 2020-11-14 NOTE — Transfer of Care (Signed)
Immediate Anesthesia Transfer of Care Note  Patient: Susan [name]  Procedure(s) Performed: INTRAMEDULLARY (IM) NAIL INTERTROCHANTRIC (Left Hip)  Patient Location: PACU  Anesthesia Type:Spinal  Level of Consciousness: awake, alert  and oriented  Airway & Oxygen Therapy: Patient Spontanous Breathing and Patient connected to face mask oxygen  Post-op Assessment: Report given to RN and Post -op Vital signs reviewed and stable  Post vital signs: Reviewed and stable  Last Vitals:  Vitals Value Taken Time  BP    Temp    Pulse 70 11/14/20 1616  Resp 17 11/14/20 1616  SpO2 100 % 11/14/20 1616  Vitals shown include unvalidated device data.  Last Pain:  Vitals:   11/14/20 1200  TempSrc:   PainSc: 8          Complications: No complications documented.

## 2020-11-14 NOTE — Anesthesia Postprocedure Evaluation (Signed)
Anesthesia Post Note  Patient: Susan [name]  Procedure(s) Performed: INTRAMEDULLARY (IM) NAIL INTERTROCHANTRIC (Left Hip)  Patient location during evaluation: PACU Anesthesia Type: Combined General/Spinal Level of consciousness: oriented and awake and alert Pain management: pain level controlled Vital Signs Assessment: post-procedure vital signs reviewed and stable Respiratory status: spontaneous breathing, respiratory function stable and patient connected to nasal cannula oxygen Cardiovascular status: blood pressure returned to baseline and stable Postop Assessment: no headache, no backache and no apparent nausea or vomiting Anesthetic complications: no   No complications documented.   Last Vitals:  Vitals:   11/14/20 1624 11/14/20 1630  BP: (!) 87/41   Pulse: 72 71  Resp: 15 15  Temp:    SpO2: 100% 100%    Last Pain:  Vitals:   11/14/20 1609  TempSrc: Temporal  PainSc: Asleep                 Arita Miss

## 2020-11-14 NOTE — Anesthesia Preprocedure Evaluation (Addendum)
Anesthesia Evaluation  Patient identified by MRN, date of birth, ID band Patient awake    Reviewed: Allergy & Precautions, NPO status , Patient's Chart, lab work & pertinent test results  History of Anesthesia Complications Negative for: history of anesthetic complications  Airway Mallampati: II  TM Distance: >3 FB Neck ROM: Full    Dental  (+) Edentulous Upper, Missing, Poor Dentition   Pulmonary neg pulmonary ROS, neg sleep apnea, neg COPD, Patient abstained from smoking.Not current smoker, former smoker,    Pulmonary exam normal breath sounds clear to auscultation       Cardiovascular Exercise Tolerance: Good METShypertension, + CAD and +CHF  (-) Past MI (-) dysrhythmias + Valvular Problems/Murmurs AI  Rhythm:Regular Rate:Normal - Systolic murmurs Mod-severe TR Mild-moderate AI  TTE 2020: 1. Left ventricular ejection fraction, by estimation, is 60 to 65%. The  left ventricle has normal function. The left ventricle has no regional  wall motion abnormalities. Left ventricular diastolic parameters are  consistent with Grade I diastolic  dysfunction (impaired relaxation).  2. Right ventricular systolic function is normal. The right ventricular  size is normal. Mildly increased right ventricular wall thickness. There  is mildly elevated pulmonary artery systolic pressure.  3. The mitral valve is grossly normal. Trivial mitral valve  regurgitation. No evidence of mitral stenosis.  4. Tricuspid valve regurgitation is moderate to severe.  5. The aortic valve is tricuspid. Aortic valve regurgitation is mild to  moderate. No aortic stenosis is present.  6. The inferior vena cava is normal in size with greater than 50%  respiratory variability, suggesting right atrial pressure of 3 mmHg.    Neuro/Psych  Headaches, PSYCHIATRIC DISORDERS Anxiety    GI/Hepatic neg GERD  ,(+)     (-) substance abuse  ,   Endo/Other  neg  diabetes  Renal/GU negative Renal ROS     Musculoskeletal   Abdominal   Peds  Hematology   Anesthesia Other Findings Past Medical History: No date: Colon cancer screening No date: Diverticulosis of colon No date: Hyperlipidemia No date: Hypertension No date: IBS (irritable bowel syndrome) 10/07/2015: Leaky heart valve No date: Macular degeneration No date: Migraine, unspecified, without mention of intractable  migraine without mention of status migrainosus No date: NASH (nonalcoholic steatohepatitis) 2016: Nonobstructive atherosclerosis of coronary artery     Comment:  Cardiac CTA with mild proximal LAD disease; CAC score               74. No date: Ocular migraine No date: Osteoporosis     Comment:  on Fosamax for 5 years No date: Other abnormal glucose No date: Other chronic nonalcoholic liver disease     Comment:  LFTs normalized with weight loss 2013 No date: Other screening mammogram No date: Symptomatic menopausal or female climacteric states No date: Thyroid disease No date: Tubular adenoma of colon No date: Urinary tract infection, site not specified  Reproductive/Obstetrics                            Anesthesia Physical Anesthesia Plan  ASA: II  Anesthesia Plan: General/Spinal   Post-op Pain Management:    Induction: Intravenous  PONV Risk Score and Plan: 4 or greater and Ondansetron, Dexamethasone, Propofol infusion, TIVA, Midazolam and Treatment may vary due to age or medical condition  Airway Management Planned: Natural Airway  Additional Equipment: None  Intra-op Plan:   Post-operative Plan:   Informed Consent: I have reviewed the patients History and  Physical, chart, labs and discussed the procedure including the risks, benefits and alternatives for the proposed anesthesia with the patient or authorized representative who has indicated his/her understanding and acceptance.       Plan Discussed with: CRNA and  Surgeon  Anesthesia Plan Comments: (Discussed R/B/A of neuraxial anesthesia technique with patient: - rare risks of spinal/epidural hematoma, nerve damage, infection - Risk of PDPH - Risk of nausea and vomiting - Risk of conversion to general anesthesia and its associated risks, including sore throat, damage to lips/teeth/oropharynx, and rare risks such as cardiac and respiratory events.  Patient voiced understanding.  Patient has listed allergy to PCN - rash over 50 years ago. Severe blistering skin reaction (SJS/TEN)? no Liver or kidney injury caused by PCN? no Hemolytic anemia from PCN? no Drug fever? no Painful swollen joints? no Severe reaction involving inside of mouth, eye, or genital ulcers? no Based on current evidence Alfonse Alpers et al, J Allergy Clin Immunol Pract, 2019), will proceed with cefazolin use: Yes  )        Anesthesia Quick Evaluation

## 2020-11-14 NOTE — Op Note (Signed)
DATE OF SURGERY:  11/14/2020  TIME: 4:11 PM  PATIENT NAME:  Susan [name]  AGE: 75 y.o.  PRE-OPERATIVE DIAGNOSIS:  Left hip fracture-comminuted, displaced intertrochanteric  POST-OPERATIVE DIAGNOSIS:  SAME  PROCEDURE:  INTRAMEDULLARY (IM) NAIL INTERTROCHANTRIC left hip  SURGEON:  Park Breed  ASST:  EBL: 50 cc  COMPLICATIONS: None  OPERATIVE IMPLANTS: Synthes trochanteric femoral nail 10 mm / 130 degree with interlocking helical blade 95 mm and distal locking screw 34 mm.  PREOPERATIVE INDICATIONS:  Susan [name] is a 75 y.o. year old who fell and suffered a hip fracture. She was brought into the ER and then admitted and optimized and then elected for surgical intervention.    The risks benefits and alternatives were discussed with the patient including but not limited to the risks of nonoperative treatment, versus surgical intervention including infection, bleeding, nerve injury, malunion, nonunion, hardware prominence, hardware failure, need for hardware removal, blood clots, cardiopulmonary complications, morbidity, mortality, among others, and they were willing to proceed.    OPERATIVE PROCEDURE:  The patient was brought to the operating room and placed in the supine position.  Spinal anesthesia was administered. She was placed on the fracture table.  Closed reduction was performed under C-arm guidance. The length of the femur was also measured using fluoroscopy. Time out was then performed after sterile prep and drape. She received preoperative antibiotics.  Incision was made proximal to the greater trochanter. A guidewire was placed in the appropriate position. Confirmation was made on AP and lateral views. The above-named nail was opened. I opened the proximal femur with a reamer. I then placed the nail by hand easily down. I did not need to ream the femur.  Once the nail was completely seated, I placed a guidepin into the femoral head into the center center  position through a second incision.  I measured the length, and then reamed the lateral cortex and up into the head. I then placed the helical blade. Slight compression was applied. Anatomic fixation achieved. Bone quality was mediocre.  I then secured the proximal interlock.  The distal locking screw was then placed and after confirming the position of the fracture fragments and hardware I then removed the instruments, and took final C-arm pictures AP and lateral the entire length of the leg. Anatomic reconstruction was achieved, and the wounds were irrigated copiously and closed with Vicryl  followed by staples and dry sterile dressing. Sponge and needle count were correct.   The patient was awakened and returned to PACU in stable and satisfactory condition. There no complications and the patient tolerated the procedure well.  She will be partial weightbearing as tolerated, and will be on Lovenox  For DVT prophylaxis.     Park Breed, M.D.

## 2020-11-14 NOTE — H&P (Signed)
THE PATIENT WAS SEEN PRIOR TO SURGERY TODAY.  HISTORY, ALLERGIES, HOME MEDICATIONS AND OPERATIVE PROCEDURE WERE REVIEWED. RISKS AND BENEFITS OF SURGERY DISCUSSED WITH PATIENT AGAIN.  NO CHANGES FROM INITIAL HISTORY AND PHYSICAL NOTED.    

## 2020-11-14 NOTE — Progress Notes (Signed)
Patient ID: Susan [name], female   DOB: 1946-06-12, 75 y.o.   MRN: [id number] [facility] PROGRESS NOTE  BRUNETTA NEWINGHAM KAJ:[id number] DOB: 1946/10/01 DOA: 11/13/2020 PCP: Tonia Ghent, MD  HPI/Subjective: The patient tripped over a storage container and fell on her left hip.  Came in with left hip pain and found to have a left hip fracture.  No complaints of chest pain or shortness of breath.  Objective: Vitals:   11/14/20 0450 11/14/20 0818  BP: 133/66 125/69  Pulse: (!) 106 98  Resp: 16 18  Temp: 98.5 F (36.9 C) 98.2 F (36.8 C)  SpO2: 96% 94%    Intake/Output Summary (Last 24 hours) at 11/14/2020 1033 Last data filed at 11/14/2020 0531 Gross per 24 hour  Intake --  Output 450 ml  Net -450 ml   Filed Weights   11/13/20 2126  Weight: 66.7 kg    ROS: Review of Systems  Respiratory: Negative for shortness of breath.   Cardiovascular: Negative for chest pain.  Gastrointestinal: Negative for abdominal pain, nausea and vomiting.   Exam: Physical Exam HENT:     Head: Normocephalic.     Mouth/Throat:     Pharynx: No oropharyngeal exudate.  Eyes:     General: Lids are normal.     Conjunctiva/sclera: Conjunctivae normal.     Pupils: Pupils are equal, round, and reactive to light.  Cardiovascular:     Rate and Rhythm: Normal rate and regular rhythm.     Heart sounds: Murmur heard.   Systolic murmur is present with a grade of 2/6.   Pulmonary:     Breath sounds: No decreased breath sounds, wheezing, rhonchi or rales.  Abdominal:     Palpations: Abdomen is soft.     Tenderness: There is no abdominal tenderness.  Musculoskeletal:     Right ankle: No swelling.     Left ankle: No swelling.  Skin:    General: Skin is warm.     Findings: No rash.  Neurological:     Mental Status: She is alert and oriented to person, place, and time.       Data Reviewed: Basic Metabolic Panel: Recent Labs  Lab 11/13/20 2130 11/14/20 0442  NA 138 137  K 4.0  4.0  CL 103 103  CO2 23 23  GLUCOSE 122* 172*  BUN 23 23  CREATININE 0.79 0.72  CALCIUM 9.7 9.0   CBC: Recent Labs  Lab 11/13/20 2130 11/14/20 0442  WBC 8.0 9.8  NEUTROABS 5.0  --   HGB 13.1 12.1  HCT 39.3 36.0  MCV 93.1 92.8  PLT 174 166     Recent Results (from the past 240 hour(s))  Resp Panel by RT-PCR (Flu A&B, Covid) Nasopharyngeal Swab     Status: None   Collection Time: 11/13/20 10:22 PM   Specimen: Nasopharyngeal Swab; Nasopharyngeal(NP) swabs in vial transport medium  Result Value Ref Range Status   SARS Coronavirus 2 by RT PCR NEGATIVE NEGATIVE Final    Comment: (NOTE) SARS-CoV-2 target nucleic acids are NOT DETECTED.  The SARS-CoV-2 RNA is generally detectable in upper respiratory specimens during the acute phase of infection. The lowest concentration of SARS-CoV-2 viral copies this assay can detect is 138 copies/mL. A negative result does not preclude SARS-Cov-2 infection and should not be used as the sole basis for treatment or other patient management decisions. A negative result may occur with  improper specimen collection/handling, submission of specimen other than nasopharyngeal swab, presence of viral mutation(s)  within the areas targeted by this assay, and inadequate number of viral copies(<138 copies/mL). A negative result must be combined with clinical observations, patient history, and epidemiological information. The expected result is Negative.  Fact Sheet for Patients:  EntrepreneurPulse.com.au  Fact Sheet for Healthcare Providers:  IncredibleEmployment.be  This test is no t yet approved or cleared by the [place] FDA and  has been authorized for detection and/or diagnosis of SARS-CoV-2 by FDA under an Emergency Use Authorization (EUA). This EUA will remain  in effect (meaning this test can be used) for the duration of the COVID-19 declaration under Section 564(b)(1) of the Act, 21 U.S.C.section  360bbb-3(b)(1), unless the authorization is terminated  or revoked sooner.       Influenza A by PCR NEGATIVE NEGATIVE Final   Influenza B by PCR NEGATIVE NEGATIVE Final    Comment: (NOTE) The Xpert Xpress SARS-CoV-2/FLU/RSV plus assay is intended as an aid in the diagnosis of influenza from Nasopharyngeal swab specimens and should not be used as a sole basis for treatment. Nasal washings and aspirates are unacceptable for Xpert Xpress SARS-CoV-2/FLU/RSV testing.  Fact Sheet for Patients: EntrepreneurPulse.com.au  Fact Sheet for Healthcare Providers: IncredibleEmployment.be  This test is not yet approved or cleared by the [place] FDA and has been authorized for detection and/or diagnosis of SARS-CoV-2 by FDA under an Emergency Use Authorization (EUA). This EUA will remain in effect (meaning this test can be used) for the duration of the COVID-19 declaration under Section 564(b)(1) of the Act, 21 U.S.C. section 360bbb-3(b)(1), unless the authorization is terminated or revoked.  Performed at [facility], [address] [place], [place] [address]      Studies: DG Chest 1 View  Result Date: 11/13/2020 CLINICAL DATA:  Mechanical fall at home with left hip pain. EXAM: CHEST  1 VIEW COMPARISON:  Chest radiograph 08/30/2015 FINDINGS: The cardiomediastinal contours are normal. Stable slight rightward tracheal deviation from prior. Pulmonary vasculature is normal. Minimal scarring in the lingula. No consolidation, pleural effusion, or pneumothorax. No acute osseous abnormalities are seen. IMPRESSION: No acute chest finding. Electronically Signed   By: Keith Rake M.D.   On: 11/13/2020 22:24   DG Hip Unilat With Pelvis 2-3 Views Left  Result Date: 11/13/2020 CLINICAL DATA:  75 year old female with fall and left hip pain. EXAM: DG HIP (WITH OR WITHOUT PELVIS) 2-3V LEFT COMPARISON:  None. FINDINGS: There is a comminuted and mildly  angulated intertrochanteric fracture the left femur with extension of the fracture into the subtrochanteric femur. No dislocation. The bones are osteopenic. Mild bilateral hip osteoarthritic changes the soft tissues are unremarkable. IMPRESSION: Comminuted and mildly angulated left femoral neck fracture. Electronically Signed   By: Anner Crete M.D.   On: 11/13/2020 22:25    Scheduled Meds: . [START ON 11/15/2020] aspirin EC  81 mg Oral Daily  . chlorhexidine  1 application Topical Once  . ezetimibe  10 mg Oral Daily  . lisinopril  20 mg Oral Daily   And  . hydrochlorothiazide  25 mg Oral Daily   Continuous Infusions: . sodium chloride 75 mL/hr at 11/14/20 0142  . clindamycin (CLEOCIN) IV      Assessment/Plan:  1. Closed left femoral neck fracture requiring operative repair. No contraindications to surgery at this time. Hold aspirin today. 2. Moderate to severe tricuspid regurgitation seen on previous echo. Discontinue IV fluids soon as possible. 3. Essential hypertension on lisinopril HCT 4. Hyperlipidemia unspecified on Zetia 5. Osteoporosis 6. IBD 7. NASH  Code Status:     Code Status Orders  (From admission, onward)         Start     Ordered   11/13/20 2252  Full code  Continuous        11/13/20 2255        Code Status History    This patient has a current code status but no historical code status.   Advance Care Planning Activity    Advance Directive Documentation   Flowsheet Row Most Recent Value  Type of Advance Directive Healthcare Power of Attorney, Living will  Pre-existing out of facility DNR order (yellow form or pink MOST form) --  "MOST" Form in Place? --     Family Communication: Husband at the bedside Disposition Plan: Status is: Inpatient  Dispo: The patient is from: Home              Anticipated d/c is to: Rehab              Anticipated d/c date is: 2 to 3 days postoperatively              Patient currently going to the operating  room to repair hip  Time spent: 27 minutes  [place]

## 2020-11-14 NOTE — Anesthesia Procedure Notes (Signed)
Date/Time: 11/14/2020 2:59 PM Performed by: Nelda Marseille, CRNA Pre-anesthesia Checklist: Patient identified, Emergency Drugs available, Suction available, Patient being monitored and Timeout performed Oxygen Delivery Method: Simple face mask

## 2020-11-15 DIAGNOSIS — K589 Irritable bowel syndrome without diarrhea: Secondary | ICD-10-CM

## 2020-11-15 LAB — HEMOGLOBIN A1C
Hgb A1c MFr Bld: 5.5 % (ref 4.8–5.6)
Mean Plasma Glucose: 111.15 mg/dL

## 2020-11-15 MED ORDER — ENSURE ENLIVE PO LIQD
237.0000 mL | Freq: Two times a day (BID) | ORAL | Status: DC
  Administered 2020-11-15: 237 mL via ORAL

## 2020-11-15 MED ORDER — ADULT MULTIVITAMIN W/MINERALS CH
1.0000 | ORAL_TABLET | Freq: Every day | ORAL | Status: DC
  Administered 2020-11-15 – 2020-11-18 (×4): 1 via ORAL
  Filled 2020-11-15 (×4): qty 1

## 2020-11-15 MED ORDER — POLYETHYLENE GLYCOL 3350 17 G PO PACK
17.0000 g | PACK | Freq: Every day | ORAL | Status: DC
  Administered 2020-11-16 – 2020-11-17 (×2): 17 g via ORAL
  Filled 2020-11-15 (×3): qty 1

## 2020-11-15 MED ORDER — SENNOSIDES-DOCUSATE SODIUM 8.6-50 MG PO TABS
2.0000 | ORAL_TABLET | Freq: Every day | ORAL | Status: DC
  Administered 2020-11-15 – 2020-11-18 (×4): 2 via ORAL
  Filled 2020-11-15 (×4): qty 2

## 2020-11-15 NOTE — Progress Notes (Signed)
Patient ID: Susan [name], female   DOB: [birth year], 75 y.o.   MRN: [id number] [facility] PROGRESS NOTE  Susan [name] JSH:[id number] DOB: 08-29-1946 DOA: 11/13/2020 PCP: Tonia Ghent, MD  HPI/Subjective: Patient feeling okay.  Still has some pain in the hip.  Did not eat much last night or this morning.  Came in after fall and found to have a hip fracture.  Objective: Vitals:   11/15/20 0730 11/15/20 1114  BP: 122/67 (!) 113/57  Pulse: (!) 104 97  Resp: 18 16  Temp: 99.1 F (37.3 C) 98.4 F (36.9 C)  SpO2: 93% 95%    Intake/Output Summary (Last 24 hours) at 11/15/2020 1339 Last data filed at 11/15/2020 0130 Gross per 24 hour  Intake 1100 ml  Output 900 ml  Net 200 ml   Filed Weights   11/13/20 2126  Weight: 66.7 kg    ROS: Review of Systems  Respiratory: Negative for shortness of breath.   Cardiovascular: Negative for chest pain.  Gastrointestinal: Negative for abdominal pain, nausea and vomiting.  Musculoskeletal: Positive for joint pain.   Exam: Physical Exam HENT:     Head: Normocephalic.     Mouth/Throat:     Pharynx: No oropharyngeal exudate.  Eyes:     General: Lids are normal.     Pupils: Pupils are equal, round, and reactive to light.  Cardiovascular:     Rate and Rhythm: Normal rate and regular rhythm.     Heart sounds: S1 normal and S2 normal. Murmur heard.   Systolic murmur is present with a grade of 3/6.   Pulmonary:     Breath sounds: Normal breath sounds. No decreased breath sounds, wheezing, rhonchi or rales.  Abdominal:     Palpations: Abdomen is soft.     Tenderness: There is no abdominal tenderness.  Musculoskeletal:     Right ankle: No swelling.     Left ankle: No swelling.  Skin:    General: Skin is warm.     Findings: No rash.  Neurological:     Mental Status: She is alert and oriented to person, place, and time.       Data Reviewed: Basic Metabolic Panel: Recent Labs  Lab 11/13/20 2130 11/14/20 0442   NA 138 137  K 4.0 4.0  CL 103 103  CO2 23 23  GLUCOSE 122* 172*  BUN 23 23  CREATININE 0.79 0.72  CALCIUM 9.7 9.0   CBC: Recent Labs  Lab 11/13/20 2130 11/14/20 0442  WBC 8.0 9.8  NEUTROABS 5.0  --   HGB 13.1 12.1  HCT 39.3 36.0  MCV 93.1 92.8  PLT 174 166     Recent Results (from the past 240 hour(s))  Resp Panel by RT-PCR (Flu A&B, Covid) Nasopharyngeal Swab     Status: None   Collection Time: 11/13/20 10:22 PM   Specimen: Nasopharyngeal Swab; Nasopharyngeal(NP) swabs in vial transport medium  Result Value Ref Range Status   SARS Coronavirus 2 by RT PCR NEGATIVE NEGATIVE Final    Comment: (NOTE) SARS-CoV-2 target nucleic acids are NOT DETECTED.  The SARS-CoV-2 RNA is generally detectable in upper respiratory specimens during the acute phase of infection. The lowest concentration of SARS-CoV-2 viral copies this assay can detect is 138 copies/mL. A negative result does not preclude SARS-Cov-2 infection and should not be used as the sole basis for treatment or other patient management decisions. A negative result may occur with  improper specimen collection/handling, submission of specimen other than nasopharyngeal swab,  presence of viral mutation(s) within the areas targeted by this assay, and inadequate number of viral copies(<138 copies/mL). A negative result must be combined with clinical observations, patient history, and epidemiological information. The expected result is Negative.  Fact Sheet for Patients:  EntrepreneurPulse.com.au  Fact Sheet for Healthcare Providers:  IncredibleEmployment.be  This test is no t yet approved or cleared by the [place] FDA and  has been authorized for detection and/or diagnosis of SARS-CoV-2 by FDA under an Emergency Use Authorization (EUA). This EUA will remain  in effect (meaning this test can be used) for the duration of the COVID-19 declaration under Section 564(b)(1) of the  Act, 21 U.S.C.section 360bbb-3(b)(1), unless the authorization is terminated  or revoked sooner.       Influenza A by PCR NEGATIVE NEGATIVE Final   Influenza B by PCR NEGATIVE NEGATIVE Final    Comment: (NOTE) The Xpert Xpress SARS-CoV-2/FLU/RSV plus assay is intended as an aid in the diagnosis of influenza from Nasopharyngeal swab specimens and should not be used as a sole basis for treatment. Nasal washings and aspirates are unacceptable for Xpert Xpress SARS-CoV-2/FLU/RSV testing.  Fact Sheet for Patients: EntrepreneurPulse.com.au  Fact Sheet for Healthcare Providers: IncredibleEmployment.be  This test is not yet approved or cleared by the [place] FDA and has been authorized for detection and/or diagnosis of SARS-CoV-2 by FDA under an Emergency Use Authorization (EUA). This EUA will remain in effect (meaning this test can be used) for the duration of the COVID-19 declaration under Section 564(b)(1) of the Act, 21 U.S.C. section 360bbb-3(b)(1), unless the authorization is terminated or revoked.  Performed at [facility], [address], [place], [place] [address]      Studies: DG Chest 1 View  Result Date: 11/13/2020 CLINICAL DATA:  Mechanical fall at home with left hip pain. EXAM: CHEST  1 VIEW COMPARISON:  Chest radiograph 08/30/2015 FINDINGS: The cardiomediastinal contours are normal. Stable slight rightward tracheal deviation from prior. Pulmonary vasculature is normal. Minimal scarring in the lingula. No consolidation, pleural effusion, or pneumothorax. No acute osseous abnormalities are seen. IMPRESSION: No acute chest finding. Electronically Signed   By: Keith Rake M.D.   On: 11/13/2020 22:24   DG HIP OPERATIVE UNILAT W OR W/O PELVIS LEFT  Result Date: 11/14/2020 CLINICAL DATA:  Left hip replacement. FLUOROSCOPY TIME:  49 seconds. Images: 5 EXAM: OPERATIVE LEFT HIP (WITH PELVIS IF PERFORMED) 5 VIEWS TECHNIQUE:  Fluoroscopic spot image(s) were submitted for interpretation post-operatively. COMPARISON:  November 13, 2020 at 9:53 p.m. FINDINGS: A gamma nail and intramedullary rod were placed across the left hip fracture throughout the study. Hardware is in good position. A distal interlocking screw is noted. IMPRESSION: Left hip fracture repair as above. Electronically Signed   By: Dorise Bullion III M.D   On: 11/14/2020 16:28   DG Hip Unilat With Pelvis 2-3 Views Left  Result Date: 11/13/2020 CLINICAL DATA:  75 year old female with fall and left hip pain. EXAM: DG HIP (WITH OR WITHOUT PELVIS) 2-3V LEFT COMPARISON:  None. FINDINGS: There is a comminuted and mildly angulated intertrochanteric fracture the left femur with extension of the fracture into the subtrochanteric femur. No dislocation. The bones are osteopenic. Mild bilateral hip osteoarthritic changes the soft tissues are unremarkable. IMPRESSION: Comminuted and mildly angulated left femoral neck fracture. Electronically Signed   By: Anner Crete M.D.   On: 11/13/2020 22:25    Scheduled Meds: . aspirin EC  81 mg Oral Daily  . ezetimibe  10 mg Oral  Daily  . feeding supplement  237 mL Oral BID BM  . lisinopril  20 mg Oral Daily   And  . hydrochlorothiazide  25 mg Oral Daily  . multivitamin with minerals  1 tablet Oral Daily    Assessment/Plan:  1. Closed left femoral neck fracture requiring operative repair.  Postoperative day 1.  Pain control as needed.  Physical therapy evaluation.  Most likely will end up needing rehab. 2. Moderate to severe tricuspid regurgitation seen on previous echo.  I discontinued fluids.  Continue to monitor. 3. Essential hypertension on lisinopril HCT 4. Hyperlipidemia unspecified on Zetia 5. Osteoporosis 6. IBD 7. [place] 8. Impaired fasting glucose check hemoglobin A1c.        Code Status:     Code Status Orders  (From admission, onward)         Start     Ordered   11/13/20 2252  Full code  Continuous         11/13/20 2255        Code Status History    This patient has a current code status but no historical code status.   Advance Care Planning Activity    Advance Directive Documentation   Flowsheet Row Most Recent Value  Type of Advance Directive Healthcare Power of Attorney, Living will  Pre-existing out of facility DNR order (yellow form or pink MOST form) --  "MOST" Form in Place? --     Family Communication: Daughter at the bedside Disposition Plan: Status is: Inpatient  Dispo: The patient is from: Home              Anticipated d/c is to: Rehab              Anticipated d/c date is: 11/17/2020              Patient currently postop day 1 for hip repair  Time spent: 28 minutes  [place]

## 2020-11-15 NOTE — Progress Notes (Signed)
Subjective: 1 Day Post-Op Procedure(s) (LRB): INTRAMEDULLARY (IM) NAIL INTERTROCHANTRIC (Left)   Patient is alert and awake.  Appears to be in good spirits.  Pain is controlled.  Has not been out of bed yet.  Susan [name] feels that Susan [name] will need to go to skilled nursing for short time.  Patient reports pain as mild.  Objective:   VITALS:   Vitals:   11/15/20 0730 11/15/20 1114  BP: 122/67 (!) 113/57  Pulse: (!) 104 97  Resp: 18 16  Temp: 99.1 F (37.3 C) 98.4 F (36.9 C)  SpO2: 93% 95%    Neurologically intact Sensation intact distally Dorsiflexion/Plantar flexion intact Incision: dressing C/D/I  LABS Recent Labs    11/13/20 2130 11/14/20 0442  HGB 13.1 12.1  HCT 39.3 36.0  WBC 8.0 9.8  PLT 174 166    Recent Labs    11/13/20 2130 11/14/20 0442  NA 138 137  K 4.0 4.0  BUN 23 23  CREATININE 0.79 0.72  GLUCOSE 122* 172*    Recent Labs    11/13/20 2130  INR 1.1     Assessment/Plan: 1 Day Post-Op Procedure(s) (LRB): INTRAMEDULLARY (IM) NAIL INTERTROCHANTRIC (Left)   Advance diet Up with therapy Discharge to SNF when stable.  Susan [name] will be partial weightbearing on the left leg with a walker  Enteric-coated aspirin twice daily on discharge for 6 weeks  Follow-up in my office in 2 weeks for x-ray and staple removal

## 2020-11-15 NOTE — Evaluation (Signed)
Physical Therapy Evaluation Patient Details Name: Susan [name] MRN: [id number] DOB: 28-Apr-1946 Today's Date: 11/15/2020   History of Present Illness  presented to ER secondary to mechanical fall in home environment with acute onset of L hip pain; admitted for management of closed comminuted fracture of L femur, s/p IM nailing 11/15/19, PWB  Clinical Impression  Patient resting in bed, daughter at bedside.  Alert and oriented, follows commands and agreeable to participation with session. Hopeful for OOB to chair, voicing goal to "get washed up and sit up a little.  I think that would make me feel a little better".  Pain to L hip 3-4/10 at rest; quickly elevates to 8-9/10 with any attempts at movement. Meds requested per RN during session.  Very limited tolerance for movement of L hip in all planes, requiring very slow, cautious act assist from therapist with all functional activities.  STrength grossly 2-/5, limited by pain.  Currently requiring mod/max assist for bed mobility; min/close sup for unsupported sitting balance.  Participated with static sitting with emphasis on postural extension and progression to midline in A/P, M/L planes, min progressing to close supervision.  Endorses dizziness with transition to upright; does not resolve with accommodation to position.  Requires return to supine for resolution.  May benefit from orthostatic assessment next session.  Additional mobility progression deferred as result; will continue to assess/progress in subsequent sessions as appropriate. Would benefit from skilled PT to address above deficits and promote optimal return to PLOF.; recommend transition to STR upon discharge from acute hospitalization.     Follow Up Recommendations SNF    Equipment Recommendations  Rolling walker with 5" wheels;3in1 (PT)    Recommendations for Other Services       Precautions / Restrictions Precautions Precautions: Fall Restrictions Weight Bearing  Restrictions: Yes LLE Weight Bearing: Partial weight bearing      Mobility  Bed Mobility Overal bed mobility: Needs Assistance Bed Mobility: Supine to Sit;Rolling;Sit to Supine Rolling: Mod assist   Supine to sit: Mod assist Sit to supine: Max assist   General bed mobility comments: extensive physical assist for management of L LE; very slow and guarded in all movement patterns, hesitant to allow therapist to guide movement, preferring to do it as indep as possible    Transfers                 General transfer comment: unsafe/unable to tolerate due to pain, dizziness with transition to upright  Ambulation/Gait             General Gait Details: unsafe/unable to tolerate due to pain, dizziness with transition to upright  Stairs            Wheelchair Mobility    Modified Rankin (Stroke Patients Only)       Balance Overall balance assessment: Needs assistance Sitting-balance support: No upper extremity supported;Feet supported Sitting balance-Leahy Scale: Fair Sitting balance - Comments: R lateral lean to offset L hip WBing; prefers UE support to further unweight hips       Standing balance comment: unsafe/unable to tolerate due to pain, dizziness with transition to upright                             Pertinent Vitals/Pain Pain Assessment: Faces Faces Pain Scale: Hurts whole lot Pain Location: L hip Pain Descriptors / Indicators: Aching;Guarding;Grimacing Pain Intervention(s): Limited activity within patient's tolerance;Monitored during session;Repositioned;Patient requesting pain meds-RN notified  Home Living Family/patient expects to be discharged to:: Private residence Living Arrangements: Spouse/significant other;Children;Other relatives Available Help at Discharge: Family;Available 24 hours/day Type of Home: House Home Access: Stairs to enter   [employer] of Steps: 1 Home Layout: One level Home Equipment: Cane -  single point      Prior Function Level of Independence: Independent         Comments: Indep with ADLs, household and community mobilization; intermittent use of SPC when R ankle "bothering me" (history of orthopedic injury)     Hand Dominance        Extremity/Trunk Assessment   Upper Extremity Assessment Upper Extremity Assessment: Overall WFL for tasks assessed    Lower Extremity Assessment Lower Extremity Assessment: Generalized weakness (L hip and knee grossly 2-/5, very pain-limited)       Communication   Communication: No difficulties  Cognition Arousal/Alertness: Awake/alert Behavior During Therapy: WFL for tasks assessed/performed Overall Cognitive Status: Within Functional Limits for tasks assessed                                        General Comments      Exercises Other Exercises Other Exercises: Static sitting with emphasis on postural extension and progression to midline in A/P, M/L planes, min progressing to close supervision.  Endorses dizziness with transition to upright; does not resolve with accommodation to position.  Requires return to supine for resolution.  May benefit from orthostatic assessment next session.   Assessment/Plan    PT Assessment Patient needs continued PT services  PT Problem List Decreased strength;Decreased range of motion;Decreased balance;Decreased mobility;Decreased coordination;Decreased cognition;Decreased knowledge of use of DME;Decreased safety awareness;Decreased knowledge of precautions;Pain;Decreased skin integrity       PT Treatment Interventions DME instruction;Gait training;Stair training;Functional mobility training;Therapeutic activities;Therapeutic exercise;Patient/family education    PT Goals (Current goals can be found in the Care Plan section)  Acute Rehab PT Goals Patient Stated Goal: to move around and maybe sit up a little bit PT Goal Formulation: With patient/family Time For Goal  Achievement: 11/29/20 Potential to Achieve Goals: Good    Frequency 7X/week   Barriers to discharge        Co-evaluation               AM-PAC PT "6 Clicks" Mobility  Outcome Measure Help needed turning from your back to your side while in a flat bed without using bedrails?: A Lot Help needed moving from lying on your back to sitting on the side of a flat bed without using bedrails?: A Lot Help needed moving to and from a bed to a chair (including a wheelchair)?: Total Help needed standing up from a chair using your arms (e.g., wheelchair or bedside chair)?: Total Help needed to walk in hospital room?: Total Help needed climbing 3-5 steps with a railing? : Total 6 Click Score: 8    End of Session   Activity Tolerance: Patient limited by pain Patient left: in bed;with call bell/phone within reach;with bed alarm set;with family/visitor present Nurse Communication: Mobility status PT Visit Diagnosis: Muscle weakness (generalized) (M62.81);Pain;Other abnormalities of gait and mobility (R26.89) Pain - Right/Left: Left Pain - part of body: Hip    Time: 8469-6295 PT Time Calculation (min) (ACUTE ONLY): 28 min   Charges:   PT Evaluation $PT Eval Moderate Complexity: 1 Mod PT Treatments $Therapeutic Activity: 8-22 mins       Cyril Mourning  Claudie Revering, PT, DPT, NCS 11/15/20, 10:54 PM [phone]

## 2020-11-15 NOTE — Progress Notes (Signed)
Initial Nutrition Assessment  DOCUMENTATION CODES:   Not applicable  INTERVENTION:   -MVI with minerals daily -Ensure Enlive po BID, each supplement provides 350 kcal and 20 grams of protein  NUTRITION DIAGNOSIS:   Increased nutrient needs related to post-op healing as evidenced by estimated needs.  GOAL:   Patient will meet greater than or equal to 90% of their needs  MONITOR:   PO intake,Supplement acceptance,Labs,Weight trends,Skin,I & O's  REASON FOR ASSESSMENT:   Consult Assessment of nutrition requirement/status,Hip fracture protocol  ASSESSMENT:   Susan [name] is a 75 y.o. female with medical history significant for HTN, nonobstructive CAD, grade 1 diastolic dysfunction on echo 06/08/2020, valvular heart disease with moderate to severe tricuspid regurg, osteoporosis, IBS, NASH, who presents to the emergency room after suffering an accidental fall after becoming entangled while taking down Christmas tree, falling onto her left hip with sudden onset pain.  Pt admitted with lt hip fracture s/p fall.   1/9- s/p PROCEDURE:  INTRAMEDULLARY (IM) NAIL INTERTROCHANTRIC left hip  Pt not available at time of attempted contact. Unable to obtain further nutriton-related history or complete nutrition-focused physical exam at this time.  No meal completion data available to assess at this time.   Reviewed wt hx; pt wt has been stable over the past year.   Pt with increased nutritional needs for post-operative healing and would greatly benefit from addition of oral nutrition supplements.   Labs reviewed.   Diet Order:   Diet Order            Diet Heart Room service appropriate? Yes; Fluid consistency: Thin  Diet effective now                 EDUCATION NEEDS:   No education needs have been identified at this time  Skin:  Skin Assessment: Skin Integrity Issues: Skin Integrity Issues:: Incisions Incisions: closed lt hip  Last BM:  11/13/20  Height:   Ht  Readings from Last 1 Encounters:  11/13/20 5' 1"  (1.549 m)    Weight:   Wt Readings from Last 1 Encounters:  11/13/20 66.7 kg    Ideal Body Weight:  47.7 kg  BMI:  Body mass index is 27.78 kg/m.  Estimated Nutritional Needs:   Kcal:  1800-2000  Protein:  90-105 grams  Fluid:  > 1.8 L    Loistine Chance, RD, LDN, [place] Registered Dietitian II Certified Diabetes Care and Education Specialist Please refer to [facility] for RD and/or RD on-call/weekend/after hours pager

## 2020-11-16 ENCOUNTER — Encounter: Payer: Self-pay | Admitting: Specialist

## 2020-11-16 DIAGNOSIS — E871 Hypo-osmolality and hyponatremia: Secondary | ICD-10-CM

## 2020-11-16 DIAGNOSIS — E876 Hypokalemia: Secondary | ICD-10-CM

## 2020-11-16 DIAGNOSIS — I951 Orthostatic hypotension: Secondary | ICD-10-CM

## 2020-11-16 LAB — BASIC METABOLIC PANEL
Anion gap: 11 (ref 5–15)
BUN: 18 mg/dL (ref 8–23)
CO2: 25 mmol/L (ref 22–32)
Calcium: 9 mg/dL (ref 8.9–10.3)
Chloride: 96 mmol/L — ABNORMAL LOW (ref 98–111)
Creatinine, Ser: 0.66 mg/dL (ref 0.44–1.00)
GFR, Estimated: >60 mL/min (ref >60)
Glucose, Bld: 163 mg/dL — ABNORMAL HIGH (ref 70–99)
Potassium: 3.6 mmol/L (ref 3.5–5.1)
Sodium: 132 mmol/L — ABNORMAL LOW (ref 135–145)

## 2020-11-16 LAB — CBC
HCT: 33.7 % — ABNORMAL LOW (ref 36.0–46.0)
Hemoglobin: 11.5 g/dL — ABNORMAL LOW (ref 12.0–15.0)
MCH: 31.9 pg (ref 26.0–34.0)
MCHC: 34.1 g/dL (ref 30.0–36.0)
MCV: 93.4 fL (ref 80.0–100.0)
Platelets: 154 10*3/uL (ref 150–400)
RBC: 3.61 MIL/uL — ABNORMAL LOW (ref 3.87–5.11)
RDW: 13.2 % (ref 11.5–15.5)
WBC: 9.6 10*3/uL (ref 4.0–10.5)
nRBC: 0 % (ref 0.0–0.2)

## 2020-11-16 MED ORDER — SODIUM CHLORIDE 0.9 % IV SOLN: INTRAVENOUS | Status: DC

## 2020-11-16 MED ORDER — LACTULOSE 10 GM/15ML PO SOLN: 30.0000 g | Freq: Every day | ORAL | Status: DC | PRN

## 2020-11-16 MED ORDER — POTASSIUM CHLORIDE CRYS ER 20 MEQ PO TBCR
40.0000 meq | EXTENDED_RELEASE_TABLET | Freq: Once | ORAL | Status: AC
  Administered 2020-11-16: 40 meq via ORAL
  Filled 2020-11-16: qty 2

## 2020-11-16 MED ORDER — SODIUM CHLORIDE 0.9 % IV BOLUS
250.0000 mL | Freq: Once | INTRAVENOUS | Status: AC
  Administered 2020-11-16: 250 mL via INTRAVENOUS

## 2020-11-16 NOTE — Evaluation (Signed)
Occupational Therapy Evaluation Patient Details Name: Susan [name] MRN: [id number] DOB: 1946/10/16 Today's Date: 11/16/2020    History of Present Illness Pt is a 75 y/o F who presented to ER secondary to mechanical fall in home environment with acute onset of L hip pain; admitted for management of closed comminuted fracture of L femur, s/p IM nailing 11/15/19, PWB   Clinical Impression   Pt seen for OT evaluation this date in setting of acute hospitalization following fall and now s/p IM nailing to L hip. Pt reports being mostly INDEP at baseline except for not being able to drive at night d/t macular degeneration. Pt presents this date with MIN/MOD L hip pain at rest that worsens with attempts to mobilize and decreased strength/fxl activity tolerance. Pt lives with spouse in [facility] with with 1 STE and reports that he will be able to help some with LB ADLs upon d/c. Currently, Pt requires MOD/MAX A for seated LB ADLs, MOD/MAX A For ADL Transfers with RW (does not extend to full stand) and INDEP with most UB ADLs except FM tasks such as opening condiments d/t limited grip/pinch strength (especially in R hand) 2/2 h/o arthritis. MIN verbal cues provided for pt to adhere to WB precautions to L LE, but because pt only comes to partial stand, unable to assess for adherence to precautions this session. OT provides ed re: role of OT And potential modifications. Pt and spouse with good reception, but will require f/u in acute setting and upon d/c. Pt left in chair with pillow under her bottom to increase comfort so she can sit up to eat her lunch. Pt left with all needs in reach. Will continue to follow acutely, but anticipate pt will require STR at SNF setting given her current care needs relative to her PLOF.     Follow Up Recommendations  SNF    Equipment Recommendations  3 in 1 bedside commode;Tub/shower seat;Other (comment) (2ww)    Recommendations for Other Services       Precautions /  Restrictions Precautions Precautions: Fall Restrictions Weight Bearing Restrictions: Yes LLE Weight Bearing: Partial weight bearing      Mobility Bed Mobility Overal bed mobility: Needs Assistance Bed Mobility: Supine to Sit     Supine to sit: Mod assist;+2 for physical assistance     General bed mobility comments: pt up to chair pre/post session    Transfers Overall transfer level: Needs assistance Equipment used: Rolling walker (2 wheeled) Transfers: Sit to/from Stand Sit to Stand: Mod assist;Max assist        Lateral/Scoot Transfers: Max assist;+2 physical assistance General transfer comment: requires extended time and upon coming to stand with RW, demos hips still moderately in flexion and does not come to full stand. Clears chair enough for OT to place pillow under her bottom to improve comfort.    Balance Overall balance assessment: Needs assistance Sitting-balance support: No upper extremity supported;Feet supported Sitting balance-Leahy Scale: Fair Sitting balance - Comments: noted to R lateral lean to decrease pressure to L hip in sitting     Standing balance-Leahy Scale: Poor Standing balance comment: pt CTS with RW with extended time and MOD/MAX A, but is noted to keep hips moderately flexed and not fully extend to totally clear chair.                           ADL either performed or assessed with clinical judgement   ADL  General ADL Comments: Pt requires MOD/MAX A for seated LB ADLs, MOD/MAX A For ADL Transfers with RW (does not extend to full stand) and INDEP with most UB ADLs except FM tasks such as opening condiments d/t limited grip/pinch strength (especially in R hand) 2/2 h/o arthritis.     Vision Baseline Vision/History: Wears glasses Wears Glasses: At all times Patient Visual Report: No change from baseline       Perception     Praxis      Pertinent Vitals/Pain Pain  Assessment: 0-10 Pain Score: 4  Pain Location: L hip Pain Descriptors / Indicators: Aching;Guarding;Grimacing Pain Intervention(s): Limited activity within patient's tolerance;Monitored during session;Repositioned (pillow under her bottom in the chair)     Hand Dominance     Extremity/Trunk Assessment Upper Extremity Assessment Upper Extremity Assessment: Overall WFL for tasks assessed;Generalized weakness (some decreased ROM in DIPs, especially of the R hand d/t arthritis and chronically inflammed joints. Grip grossly 4-/5, MMT of shoulders, elbows and wrist grossly 4/5.)   Lower Extremity Assessment Lower Extremity Assessment: RLE deficits/detail;LLE deficits/detail RLE Deficits / Details: ROM grossly WFL, some weakness in standing noted LLE Deficits / Details: very limited hip/knee flex/ext 2/2 c/o pain LLE: Unable to fully assess due to pain       Communication Communication Communication: No difficulties   Cognition Arousal/Alertness: Awake/alert Behavior During Therapy: WFL for tasks assessed/performed Overall Cognitive Status: Within Functional Limits for tasks assessed                                     General Comments       Exercises Other Exercises Other Exercises: OT facilitates ed re: role of OT with pt and spouse, importance of OOB/OOC activity, UB strengthening, importance of performing ADLs as I'ly as possible, PWB to L LE, potential need for AE education for LB ADLs. Pt and spouse with good understanding. Other Exercises: Orthostatic assessment: supine BP 86/50, HR 90; sitting BP 103/51, HR 87; immediately after transfer to chair BP 69/54, HR 91; reclined in chair, end of session BP 99/60, HR 87.  RN informed/aware.   Shoulder Instructions      Home Living Family/patient expects to be discharged to:: Private residence Living Arrangements: Spouse/significant other;Children;Other relatives Available Help at Discharge: Family;Available 24  hours/day Type of Home: House Home Access: Stairs to enter [employer] of Steps: 1   Home Layout: One level               Home Equipment: Cane - single point          Prior Functioning/Environment Level of Independence: Independent        Comments: Indep with ADLs, household and community mobilization; intermittent use of SPC when R ankle "bothering me" (history of orthopedic injury)        OT Problem List: Decreased strength;Impaired balance (sitting and/or standing);Decreased range of motion;Decreased activity tolerance;Decreased knowledge of use of DME or AE;Pain      OT Treatment/Interventions: Self-care/ADL training;DME and/or AE instruction;Therapeutic activities;Balance training;Therapeutic exercise;Patient/family education    OT Goals(Current goals can be found in the care plan section) Acute Rehab OT Goals Patient Stated Goal: to be able to get up a little easier and get stronger OT Goal Formulation: With patient/family Time For Goal Achievement: 11/30/20 Potential to Achieve Goals: Good ADL Goals Pt Will Perform Lower Body Dressing: with min assist;with adaptive equipment;sit to/from stand (with LRAD for sit<>stand)  Pt Will Transfer to Toilet: with min assist;stand pivot transfer;bedside commode Pt Will Perform Toileting - Clothing Manipulation and hygiene: with min assist;sit to/from stand Pt/caregiver will Perform Home Exercise Program: Increased strength;Both right and left upper extremity;With minimal assist  OT Frequency: Min 1X/week   Barriers to D/C:            Co-evaluation              AM-PAC OT "6 Clicks" Daily Activity     Outcome Measure Help from another person eating meals?: A Little Help from another person taking care of personal grooming?: A Little Help from another person toileting, which includes using toliet, bedpan, or urinal?: A Lot Help from another person bathing (including washing, rinsing, drying)?: A  Lot Help from another person to put on and taking off regular upper body clothing?: A Little Help from another person to put on and taking off regular lower body clothing?: A Lot 6 Click Score: 15   End of Session Equipment Utilized During Treatment: Gait belt;Rolling walker Nurse Communication: Mobility status  Activity Tolerance: Patient tolerated treatment well;Other (comment) (limited with standing 2/2 dizziness, orthostatics not taken as pt with very limtied standing tolerance.) Patient left: in chair;with call bell/phone within reach;with chair alarm set  OT Visit Diagnosis: Unsteadiness on feet (R26.81);Muscle weakness (generalized) (M62.81)                Time: 1749-4496 OT Time Calculation (min): 45 min Charges:  OT General Charges $OT Visit: 1 Visit OT Evaluation $OT Eval Moderate Complexity: 1 Mod OT Treatments $Self Care/Home Management : 23-37 mins $Therapeutic Activity: 8-22 mins  Gerrianne Scale, MS, OTR/L ascom [phone] 11/16/20, 4:11 PM

## 2020-11-16 NOTE — Plan of Care (Signed)
Pt alert and oriented, on room air, afebrile. Pt denied any pain, nausea, Sob or headache. Falls precautions remained in place, call bell within reach Problem: Pain Managment: Goal: General experience of comfort will improve Outcome: Progressing   Problem: Safety: Goal: Ability to remain free from injury will improve Outcome: Progressing   Problem: Skin Integrity: Goal: Risk for impaired skin integrity will decrease Outcome: Progressing

## 2020-11-16 NOTE — Progress Notes (Signed)
Patient ID: Susan [name], female   DOB: 07/09/1946, 75 y.o.   MRN: [id number] [facility] PROGRESS NOTE  Susan [name]:[id number] DOB: 06/10/1946 DOA: 11/13/2020 PCP: Tonia Ghent, MD  HPI/Subjective: Patient still has pain in her hip.  Blood pressure this morning was a little on the lower side and we had to hold her blood pressure medications.  He was then orthostatic with physical therapy.  Patient's appetite not quite the best.  Still not feeling great.  No chest pain or shortness of breath.  Objective: Vitals:   11/16/20 0543 11/16/20 0840  BP: (!) 111/56 (!) 110/56  Pulse: 97 95  Resp: 18 16  Temp: 98.9 F (37.2 C) 97.8 F (36.6 C)  SpO2: 94% 94%    Intake/Output Summary (Last 24 hours) at 11/16/2020 1255 Last data filed at 11/16/2020 0447 Gross per 24 hour  Intake 157 ml  Output 1900 ml  Net -1743 ml   Filed Weights   11/13/20 2126  Weight: 66.7 kg    ROS: Review of Systems  Respiratory: Negative for shortness of breath.   Cardiovascular: Negative for chest pain.  Gastrointestinal: Negative for abdominal pain, nausea and vomiting.  Musculoskeletal: Positive for joint pain.   Exam: Physical Exam HENT:     Head: Normocephalic.     Mouth/Throat:     Pharynx: No oropharyngeal exudate.  Eyes:     General: Lids are normal.     Conjunctiva/sclera: Conjunctivae normal.     Pupils: Pupils are equal, round, and reactive to light.  Cardiovascular:     Rate and Rhythm: Normal rate and regular rhythm.     Heart sounds: S1 normal and S2 normal. Murmur heard.   Systolic murmur is present with a grade of 2/6.   Pulmonary:     Breath sounds: No decreased breath sounds, wheezing, rhonchi or rales.  Abdominal:     Palpations: Abdomen is soft.     Tenderness: There is no abdominal tenderness.  Musculoskeletal:     Right lower leg: No swelling.     Left lower leg: No swelling.  Skin:    General: Skin is warm.     Findings: No rash.  Neurological:      Mental Status: She is alert and oriented to person, place, and time.       Data Reviewed: Basic Metabolic Panel: Recent Labs  Lab 11/13/20 2130 11/14/20 0442 11/16/20 1128  NA 138 137 132*  K 4.0 4.0 3.6  CL 103 103 96*  CO2 23 23 25   GLUCOSE 122* 172* 163*  BUN 23 23 18   CREATININE 0.79 0.72 0.66  CALCIUM 9.7 9.0 9.0   CBC: Recent Labs  Lab 11/13/20 2130 11/14/20 0442 11/16/20 1128  WBC 8.0 9.8 9.6  NEUTROABS 5.0  --   --   HGB 13.1 12.1 11.5*  HCT 39.3 36.0 33.7*  MCV 93.1 92.8 93.4  PLT 174 166 154     Recent Results (from the past 240 hour(s))  Resp Panel by RT-PCR (Flu A&B, Covid) Nasopharyngeal Swab     Status: None   Collection Time: 11/13/20 10:22 PM   Specimen: Nasopharyngeal Swab; Nasopharyngeal(NP) swabs in vial transport medium  Result Value Ref Range Status   SARS Coronavirus 2 by RT PCR NEGATIVE NEGATIVE Final    Comment: (NOTE) SARS-CoV-2 target nucleic acids are NOT DETECTED.  The SARS-CoV-2 RNA is generally detectable in upper respiratory specimens during the acute phase of infection. The lowest concentration of SARS-CoV-2 viral  copies this assay can detect is 138 copies/mL. A negative result does not preclude SARS-Cov-2 infection and should not be used as the sole basis for treatment or other patient management decisions. A negative result may occur with  improper specimen collection/handling, submission of specimen other than nasopharyngeal swab, presence of viral mutation(s) within the areas targeted by this assay, and inadequate number of viral copies(<138 copies/mL). A negative result must be combined with clinical observations, patient history, and epidemiological information. The expected result is Negative.  Fact Sheet for Patients:  EntrepreneurPulse.com.au  Fact Sheet for Healthcare Providers:  IncredibleEmployment.be  This test is no t yet approved or cleared by the [place] FDA  and  has been authorized for detection and/or diagnosis of SARS-CoV-2 by FDA under an Emergency Use Authorization (EUA). This EUA will remain  in effect (meaning this test can be used) for the duration of the COVID-19 declaration under Section 564(b)(1) of the Act, 21 U.S.C.section 360bbb-3(b)(1), unless the authorization is terminated  or revoked sooner.       Influenza A by PCR NEGATIVE NEGATIVE Final   Influenza B by PCR NEGATIVE NEGATIVE Final    Comment: (NOTE) The Xpert Xpress SARS-CoV-2/FLU/RSV plus assay is intended as an aid in the diagnosis of influenza from Nasopharyngeal swab specimens and should not be used as a sole basis for treatment. Nasal washings and aspirates are unacceptable for Xpert Xpress SARS-CoV-2/FLU/RSV testing.  Fact Sheet for Patients: EntrepreneurPulse.com.au  Fact Sheet for Healthcare Providers: IncredibleEmployment.be  This test is not yet approved or cleared by the [place] FDA and has been authorized for detection and/or diagnosis of SARS-CoV-2 by FDA under an Emergency Use Authorization (EUA). This EUA will remain in effect (meaning this test can be used) for the duration of the COVID-19 declaration under Section 564(b)(1) of the Act, 21 U.S.C. section 360bbb-3(b)(1), unless the authorization is terminated or revoked.  Performed at [facility], [address] [place], [place] [address]      Studies: [place] HIP OPERATIVE UNILAT W OR W/O PELVIS LEFT  Result Date: 11/14/2020 CLINICAL DATA:  Left hip replacement. FLUOROSCOPY TIME:  49 seconds. Images: 5 EXAM: OPERATIVE LEFT HIP (WITH PELVIS IF PERFORMED) 5 VIEWS TECHNIQUE: Fluoroscopic spot image(s) were submitted for interpretation post-operatively. COMPARISON:  November 13, 2020 at 9:53 p.m. FINDINGS: A gamma nail and intramedullary rod were placed across the left hip fracture throughout the study. Hardware is in good position. A distal  interlocking screw is noted. IMPRESSION: Left hip fracture repair as above. Electronically Signed   By: Dorise Bullion III M.D   On: 11/14/2020 16:28    Scheduled Meds: . aspirin EC  81 mg Oral Daily  . ezetimibe  10 mg Oral Daily  . feeding supplement  237 mL Oral BID BM  . multivitamin with minerals  1 tablet Oral Daily  . polyethylene glycol  17 g Oral Daily  . potassium chloride  40 mEq Oral Once  . senna-docusate  2 tablet Oral QHS   Continuous Infusions: . sodium chloride     Brief history: patient was admitted 11/13/2020 after a fall and found to have a hip fracture.  Dr. Sabra Heck orthopedic surgery took to the operating room on 11/14/2020 for intramedullary nail intertrochanteric left hip procedure.  Patient's blood pressure was a little bit low and required a fluid bolus and holding her antihypertensive medications.  Out to rehab once insurance company authorizes.   Assessment/Plan:  1. Orthostatic hypotension.  Hold antihypertensive medications.  Giving  a fluid bolus today and will put on gentle IV fluid hydration. 2. Closed left femoral neck fracture requiring operative repair on 11/14/2020 by Dr. Sabra Heck.  Physical therapy continued evaluation.  Will end up needing rehab.  Postoperative day 2.  Pain control as needed.  We will also need insurance company authorization to go out to rehab. 3. Moderate to severe tricuspid regurgitation seen on previous echo.  Watch fluid status closely with IV fluids. 4. History of essential hypertension.  I discontinued lisinopril HCT 5. Hyperlipidemia unspecified on Zetia 6. Osteoporosis 7. IBD 8. [place] 9. Impaired fasting glucose.  Patient is not a diabetic hemoglobin A1c 5.5. 10. Hypokalemia and hyponatremia today on labs.  We will give gentle IV fluids and oral potassium supplementation        Code Status:     Code Status Orders  (From admission, onward)         Start     Ordered   11/13/20 2252  Full code  Continuous        11/13/20  2255        Code Status History    This patient has a current code status but no historical code status.   Advance Care Planning Activity    Advance Directive Documentation   Flowsheet Row Most Recent Value  Type of Advance Directive Healthcare Power of Attorney, Living will  Pre-existing out of facility DNR order (yellow form or pink MOST form) --  "MOST" Form in Place? --     Family Communication: Husband at the bedside Disposition Plan: Status is: Inpatient  Dispo: The patient is from: Home              Anticipated d/c is to: Rehab              Anticipated d/c date is: Once insurance company authorizes rehab              Patient currently being treated today for orthostatic hypotension holding blood pressure medications and giving IV fluids.  Time spent: 27 minutes  [place]

## 2020-11-16 NOTE — Progress Notes (Signed)
Subjective: 2 Days Post-Op Procedure(s) (LRB): INTRAMEDULLARY (IM) NAIL INTERTROCHANTRIC (Left)   Out of bed in a chair eating lunch.  Alert and comfortable.  Started PT again.  Will need skilled nursing placement.  Patient reports pain as mild.  Objective:   VITALS:   Vitals:   11/16/20 0840 11/16/20 1259  BP: (!) 110/56 (!) 103/45  Pulse: 95 85  Resp: 16 16  Temp: 97.8 F (36.6 C) (!) 97.5 F (36.4 C)  SpO2: 94% 99%    Neurologically intact Sensation intact distally Dorsiflexion/Plantar flexion intact Incision: dressing C/D/I  LABS Recent Labs    11/13/20 2130 11/14/20 0442 11/16/20 1128  HGB 13.1 12.1 11.5*  HCT 39.3 36.0 33.7*  WBC 8.0 9.8 9.6  PLT 174 166 154    Recent Labs    11/13/20 2130 11/14/20 0442 11/16/20 1128  NA 138 137 132*  K 4.0 4.0 3.6  BUN 23 23 18   CREATININE 0.79 0.72 0.66  GLUCOSE 122* 172* 163*    Recent Labs    11/13/20 2130  INR 1.1     Assessment/Plan: 2 Days Post-Op Procedure(s) (LRB): INTRAMEDULLARY (IM) NAIL INTERTROCHANTRIC (Left)   Advance diet Up with therapy Discharge to SNF   Partial weightbearing left leg with walker  Enteric-coated aspirin twice daily for 6 weeks  Return to clinic 2 weeks for x-ray and staple removal

## 2020-11-16 NOTE — Progress Notes (Signed)
Physical Therapy Treatment Patient Details Name: Susan [name] MRN: [id number] DOB: 1946/01/02 Today's Date: 11/16/2020    History of Present Illness presented to ER secondary to mechanical fall in home environment with acute onset of L hip pain; admitted for management of closed comminuted fracture of L femur, s/p IM nailing 11/15/19, PWB    PT Comments    Patient eager for OOB to chair as able-able to complete via lateral/scoot pivot, max assist +2; very limited tolerance for forward trunk lean/weight shift, limited tolerance for L hip flexion.  Does continues to endorse dizziness with transition to upright.  Orthostatic assessment: supine BP 86/50, HR 90; sitting BP 103/51, HR 87; immediately after transfer to chair BP 69/54, HR 91; reclined in chair, end of session BP 99/60, HR 87.  RN informed/aware.   Follow Up Recommendations  SNF     Equipment Recommendations       Recommendations for Other Services       Precautions / Restrictions Precautions Precautions: Fall Restrictions Weight Bearing Restrictions: Yes LLE Weight Bearing: Partial weight bearing    Mobility  Bed Mobility Overal bed mobility: Needs Assistance Bed Mobility: Supine to Sit     Supine to sit: Mod assist;+2 for physical assistance     General bed mobility comments: extensive physical assist for management of L LE; very slow and guarded in all movement patterns, hesitant to allow therapist to guide movement, preferring to do it as indep as possible.  Limited tolerance for forward trunk lean/L hip flexion.  Transfers Overall transfer level: Needs assistance   Transfers: Lateral/Scoot Transfers          Lateral/Scoot Transfers: Max assist;+2 physical assistance General transfer comment: constant cuing/assist for forward trunk lean/weight shift, lift off and lateral movement  Ambulation/Gait             General Gait Details: unsafe/unable to tolerate due to pain, dizziness with  transition to upright   Stairs             Wheelchair Mobility    Modified Rankin (Stroke Patients Only)       Balance Overall balance assessment: Needs assistance Sitting-balance support: No upper extremity supported;Feet supported Sitting balance-Leahy Scale: Fair Sitting balance - Comments: R lateral lean to offset L hip WBing; prefers UE support to further unweight hips       Standing balance comment: unsafe/unable to tolerate due to pain, dizziness with transition to upright                            Cognition Arousal/Alertness: Awake/alert Behavior During Therapy: WFL for tasks assessed/performed Overall Cognitive Status: Within Functional Limits for tasks assessed                                        Exercises Other Exercises Other Exercises: Supine LE therex, 1x10, act assist ROM: ankle pumps, quad sets, SAQs, heel slides.  Cuing for relaxation to maximize ROM Other Exercises: Orthostatic assessment: supine BP 86/50, HR 90; sitting BP 103/51, HR 87; immediately after transfer to chair BP 69/54, HR 91; reclined in chair, end of session BP 99/60, HR 87.  RN informed/aware.    General Comments        Pertinent Vitals/Pain Pain Assessment: 0-10 Pain Score: 6  Pain Location: L hip Pain Descriptors / Indicators: Aching;Guarding;Grimacing Pain Intervention(s): Limited activity  within patient's tolerance;Monitored during session;Premedicated before session;Repositioned    Home Living                      Prior Function            PT Goals (current goals can now be found in the care plan section) Acute Rehab PT Goals Patient Stated Goal: to move around and maybe sit up a little bit PT Goal Formulation: With patient/family Time For Goal Achievement: 11/29/20 Potential to Achieve Goals: Good Progress towards PT goals: Progressing toward goals    Frequency    7X/week      PT Plan Current plan remains  appropriate    Co-evaluation              AM-PAC PT "6 Clicks" Mobility   Outcome Measure  Help needed turning from your back to your side while in a flat bed without using bedrails?: A Lot Help needed moving from lying on your back to sitting on the side of a flat bed without using bedrails?: A Lot Help needed moving to and from a bed to a chair (including a wheelchair)?: Total Help needed standing up from a chair using your arms (e.g., wheelchair or bedside chair)?: Total Help needed to walk in hospital room?: Total Help needed climbing 3-5 steps with a railing? : Total 6 Click Score: 8    End of Session Equipment Utilized During Treatment: Gait belt Activity Tolerance: Patient limited by pain;Patient tolerated treatment well Patient left: in chair;with call bell/phone within reach;with chair alarm set Nurse Communication: Mobility status PT Visit Diagnosis: Muscle weakness (generalized) (M62.81);Pain;Other abnormalities of gait and mobility (R26.89) Pain - Right/Left: Right Pain - part of body: Hip     Time: 2409-7353 PT Time Calculation (min) (ACUTE ONLY): 26 min  Charges:  $Therapeutic Exercise: 8-22 mins $Therapeutic Activity: 8-22 mins                     Keldrick Pomplun H. Owens Shark, PT, DPT, NCS 11/16/20, 2:02 PM [phone]

## 2020-11-16 NOTE — NC FL2 (Signed)
[place] LEVEL OF CARE SCREENING TOOL     IDENTIFICATION  Patient Name: Susan [name] Birthdate: 11-22-1945 Sex: female Admission Date (Current Location): 11/13/2020  [place] and [place] Number:  Engineering geologist and Address:  [facility], [address] [place], [place] [address]      Provider Number: [id number]  Attending Physician Name and Address:  Loletha Grayer, MD  Relative Name and Phone Number:  Cherilynn, [place] (Spouse)   [phone] [facility])    Current Level of Care: Hospital Recommended Level of Care: [place] Prior Approval Number:    Date Approved/Denied:   PASRR Number: [id number]  Discharge Plan: SNF    Current Diagnoses: Patient Active Problem List   Diagnosis Date Noted  . Orthostatic hypotension   . Hypokalemia   . Hyponatremia   . Tricuspid valve insufficiency   . IBD (inflammatory bowel disease)   . NASH (nonalcoholic steatohepatitis)   . Chronic Grade I diastolic CHF on echocardiogram 06/08/20 ([place]) 11/13/2020  . Closed comminuted intertrochanteric fracture of left femur ([place]) 11/13/2020  . Accidental fall 11/13/2020  . Preoperative clearance 11/13/2020  . Closed left hip fracture, initial encounter ([place]) 11/13/2020  . Valvular heart disease 06/09/2020  . Exudative age-related macular degeneration of right eye with active choroidal neovascularization ([place]) 03/23/2020  . Exudative age-related macular degeneration of left eye with active choroidal neovascularization ([place]) 02/10/2020  . Exudative age-related macular degeneration of right eye with inactive choroidal neovascularization ([place]) 02/10/2020  . Posterior vitreous detachment of right eye 02/10/2020  . Posterior vitreous detachment of left eye 02/10/2020  . Advanced nonexudative age-related macular degeneration of right eye with subfoveal involvement 02/10/2020  . Mitral valve insufficiency 12/12/2019  . Aortic valve  regurgitation 12/12/2019  . CAD in native artery 09/05/2019  . Carotid artery plaque, bilateral 09/05/2019  . Right shoulder pain 08/20/2019  . Health care maintenance 02/03/2018  . Hand numbness 04/10/2017  . Numbness and tingling of foot 04/10/2017  . Carpal tunnel syndrome of right wrist 04/10/2017  . Neuropathy 02/27/2017  . HLD (hyperlipidemia) 07/23/2016  . Cystocele 05/19/2015  . Dupuytren contracture 03/24/2015  . Advance care planning 03/24/2015  . Benign paroxysmal positional vertigo 11/09/2014  . Anxiety state 11/09/2014  . Fatigue 08/27/2014  . Multiple joint pain 07/09/2014  . Medicare annual wellness visit, initial 08/25/2013  . Abdominal pain, other specified site 08/25/2013  . Multiple thyroid nodules 08/06/2013  . Bilateral carotid artery stenosis 05/22/2013  . Memory changes 08/27/2012  . Lipoma 05/05/2011  . Disordered eating 05/05/2011  . B12 deficiency 11/18/2010  . Irritable bowel syndrome 11/11/2010  . DIVERTICULOSIS, COLON 11/04/2010  . Non-alcoholic fatty liver disease 11/04/2010  . COLONIC POLYPS, HX OF 11/04/2010  . Hyperglycemia 08/12/2010  . MIGRAINE HEADACHE 12/27/2007  . Essential hypertension 12/27/2007  . MENOPAUSAL SYNDROME 12/27/2007  . Osteoporosis 11/05/2007  . LFT elevation 05/06/2006    Orientation RESPIRATION BLADDER Height & Weight     Self,Time,Situation,Place  Normal External catheter,Incontinent Weight: 147 lb (66.7 kg) Height:  5' 1"  (154.9 cm)  BEHAVIORAL SYMPTOMS/MOOD NEUROLOGICAL BOWEL NUTRITION STATUS        Diet (heart, thin liquids)  AMBULATORY STATUS COMMUNICATION OF NEEDS Skin   Extensive Assist Verbally Surgical wounds (L hip closed surgical incision)                       Personal Care Assistance Level of Assistance  Bathing,Feeding,Dressing Bathing Assistance: Maximum assistance Feeding assistance: Independent Dressing  Assistance: Maximum assistance     Functional Limitations Info              SPECIAL CARE FACTORS FREQUENCY  PT (By licensed PT),OT (By licensed OT)     PT Frequency: 5 x/week OT Frequency: 5 x/week            Contractures      Additional Factors Info  Code Status,Allergies Code Status Info: full code Allergies Info: lipitor, penicillins, sulfonamide derivatives           Current Medications (11/16/2020):  This is the current hospital active medication list Current Facility-Administered Medications  Medication Dose Route Frequency Provider Last Rate Last Admin  . 0.9 %  sodium chloride infusion   Intravenous Continuous Loletha Grayer, MD 30 mL/hr at 11/16/20 1420 New Bag at 11/16/20 1420  . aspirin EC tablet 81 mg  81 mg Oral Daily Loletha Grayer, MD   81 mg at 11/16/20 0859  . bisacodyl (DULCOLAX) EC tablet 5 mg  5 mg Oral Daily PRN Athena Masse, MD      . ezetimibe (ZETIA) tablet 10 mg  10 mg Oral Daily Athena Masse, MD   10 mg at 11/16/20 0907  . feeding supplement (ENSURE ENLIVE / ENSURE PLUS) liquid 237 mL  237 mL Oral BID BM Loletha Grayer, MD   237 mL at 11/15/20 1348  . HYDROcodone-acetaminophen (NORCO/VICODIN) 5-325 MG per tablet 1-2 tablet  1-2 tablet Oral Q6H PRN Athena Masse, MD   2 tablet at 11/16/20 0858  . lactulose (CHRONULAC) 10 GM/15ML solution 30 g  30 g Oral Daily PRN Wieting, Richard, MD      . morphine 2 MG/ML injection 0.5 mg  0.5 mg Intravenous Q2H PRN Athena Masse, MD   0.5 mg at 11/15/20 0129  . multivitamin with minerals tablet 1 tablet  1 tablet Oral Daily Loletha Grayer, MD   1 tablet at 11/16/20 0859  . ondansetron (ZOFRAN) injection 4 mg  4 mg Intravenous Q6H PRN Lang Snow, NP   4 mg at 11/15/20 1704  . polyethylene glycol (MIRALAX / GLYCOLAX) packet 17 g  17 g Oral Daily Loletha Grayer, MD   17 g at 11/16/20 0859  . senna-docusate (Senokot-S) tablet 2 tablet  2 tablet Oral QHS Loletha Grayer, MD   2 tablet at 11/15/20 2108     Discharge Medications: Please see discharge summary  for a list of discharge medications.  Relevant Imaging Results:  Relevant Lab Results:   Additional Information SS #: [place], LCSW

## 2020-11-16 NOTE — TOC Initial Note (Addendum)
Transition of Care [facility]) - Initial/Assessment Note    Patient Details  Name: Susan [name] MRN: [id number] Date of Birth: May 03, 1946  Transition of Care [facility]) CM/SW Contact:    Magnus Ivan, LCSW Phone Number: 11/16/2020, 3:19 PM  Clinical Narrative:                CSW spoke with patient. Patient lives with husband, daughter, and grandchildren. Patient drives herself to appointments. PCP is Dr. Damita Dunnings. Pharmacy is [facility]. No DME or SNF history. Patient agreeable to SNF recommendation. CSW starting SNF work up. [facility], patient is not managed by [employer] so whichever SNF patient chooses will have to start insurance auth.   Expected Discharge Plan: Skilled Nursing Facility Barriers to Discharge: Continued Medical Work up   Patient Goals and CMS Choice Patient states their goals for this hospitalization and ongoing recovery are:: SNF rehab CMS Medicare.gov Compare Post Acute Care list provided to:: Patient Choice offered to / list presented to : Patient  Expected Discharge Plan and Services Expected Discharge Plan: [place]       Living arrangements for the past 2 months: Single Family Home                                      Prior Living Arrangements/Services Living arrangements for the past 2 months: Single Family Home Lives with:: Spouse,Relatives Patient language and need for interpreter reviewed:: Yes Do you feel safe going back to the place where you live?: Yes      Need for Family Participation in Patient Care: Yes (Comment) Care giver support system in place?: Yes (comment)   Criminal Activity/Legal Involvement Pertinent to Current Situation/Hospitalization: No - Comment as needed  Activities of Daily Living Home Assistive Devices/Equipment: Eyeglasses ADL Screening (condition at time of admission) Patient's cognitive ability adequate to safely complete daily activities?: Yes Is the patient deaf or have difficulty  hearing?: No Does the patient have difficulty seeing, even when wearing glasses/contacts?: Yes (macular degeneration) Does the patient have difficulty concentrating, remembering, or making decisions?: No Patient able to express need for assistance with ADLs?: Yes Does the patient have difficulty dressing or bathing?: Yes Independently performs ADLs?: No Communication: Independent Dressing (OT): Needs assistance Is this a change from baseline?: Change from baseline, expected to last >3 days Grooming: Needs assistance Is this a change from baseline?: Change from baseline, expected to last >3 days Feeding: Independent Bathing: Needs assistance Is this a change from baseline?: Change from baseline, expected to last >3 days Toileting: Needs assistance Is this a change from baseline?: Change from baseline, expected to last >3days In/Out Bed: Needs assistance Is this a change from baseline?: Change from baseline, expected to last >3 days Walks in Home: Needs assistance Is this a change from baseline?: Change from baseline, expected to last >3 days Does the patient have difficulty walking or climbing stairs?: Yes Weakness of Legs: Left Weakness of Arms/Hands: None  Permission Sought/Granted Permission sought to share information with : Facility Art therapist granted to share information with : Yes, Verbal Permission Granted     Permission granted to share info w AGENCY: SNFs        Emotional Assessment       Orientation: : Oriented to Self,Oriented to Place,Oriented to  Time,Oriented to Situation Alcohol / Substance Use: Not Applicable Psych Involvement: No (comment)  Admission diagnosis:  Closed left  hip fracture, initial encounter [facility]) [S72.002A] Patient Active Problem List   Diagnosis Date Noted  . Orthostatic hypotension   . Hypokalemia   . Hyponatremia   . Tricuspid valve insufficiency   . IBD (inflammatory bowel disease)   . NASH (nonalcoholic  steatohepatitis)   . Chronic Grade I diastolic CHF on echocardiogram 06/08/20 ([place]) 11/13/2020  . Closed comminuted intertrochanteric fracture of left femur ([place]) 11/13/2020  . Accidental fall 11/13/2020  . Preoperative clearance 11/13/2020  . Closed left hip fracture, initial encounter ([employer]) 11/13/2020  . Valvular heart disease 06/09/2020  . Exudative age-related macular degeneration of right eye with active choroidal neovascularization ([place]) 03/23/2020  . Exudative age-related macular degeneration of left eye with active choroidal neovascularization ([place]) 02/10/2020  . Exudative age-related macular degeneration of right eye with inactive choroidal neovascularization ([place]) 02/10/2020  . Posterior vitreous detachment of right eye 02/10/2020  . Posterior vitreous detachment of left eye 02/10/2020  . Advanced nonexudative age-related macular degeneration of right eye with subfoveal involvement 02/10/2020  . Mitral valve insufficiency 12/12/2019  . Aortic valve regurgitation 12/12/2019  . CAD in native artery 09/05/2019  . Carotid artery plaque, bilateral 09/05/2019  . Right shoulder pain 08/20/2019  . Health care maintenance 02/03/2018  . Hand numbness 04/10/2017  . Numbness and tingling of foot 04/10/2017  . Carpal tunnel syndrome of right wrist 04/10/2017  . Neuropathy 02/27/2017  . HLD (hyperlipidemia) 07/23/2016  . Cystocele 05/19/2015  . Dupuytren contracture 03/24/2015  . Advance care planning 03/24/2015  . Benign paroxysmal positional vertigo 11/09/2014  . Anxiety state 11/09/2014  . Fatigue 08/27/2014  . Multiple joint pain 07/09/2014  . Medicare annual wellness visit, initial 08/25/2013  . Abdominal pain, other specified site 08/25/2013  . Multiple thyroid nodules 08/06/2013  . Bilateral carotid artery stenosis 05/22/2013  . Memory changes 08/27/2012  . Lipoma 05/05/2011  . Disordered eating 05/05/2011  . B12 deficiency 11/18/2010  . Irritable bowel syndrome 11/11/2010  .  DIVERTICULOSIS, COLON 11/04/2010  . Non-alcoholic fatty liver disease 11/04/2010  . COLONIC POLYPS, HX OF 11/04/2010  . Hyperglycemia 08/12/2010  . MIGRAINE HEADACHE 12/27/2007  . Essential hypertension 12/27/2007  . MENOPAUSAL SYNDROME 12/27/2007  . Osteoporosis 11/05/2007  . LFT elevation 05/06/2006   PCP:  Tonia Ghent, MD Pharmacy:   [facility], [place] [address] Phone: [phone] Fax: [phone]     Social Determinants of Health (SDOH) Interventions    Readmission Risk Interventions No flowsheet data found.

## 2020-11-17 DIAGNOSIS — I5032 Chronic diastolic (congestive) heart failure: Secondary | ICD-10-CM

## 2020-11-17 DIAGNOSIS — S72142S Displaced intertrochanteric fracture of left femur, sequela: Secondary | ICD-10-CM

## 2020-11-17 DIAGNOSIS — I10 Essential (primary) hypertension: Secondary | ICD-10-CM

## 2020-11-17 LAB — CBC
HCT: 32.1 % — ABNORMAL LOW (ref 36.0–46.0)
Hemoglobin: 10.7 g/dL — ABNORMAL LOW (ref 12.0–15.0)
MCH: 31.4 pg (ref 26.0–34.0)
MCHC: 33.3 g/dL (ref 30.0–36.0)
MCV: 94.1 fL (ref 80.0–100.0)
Platelets: 171 10*3/uL (ref 150–400)
RBC: 3.41 MIL/uL — ABNORMAL LOW (ref 3.87–5.11)
RDW: 13.2 % (ref 11.5–15.5)
WBC: 8.8 10*3/uL (ref 4.0–10.5)
nRBC: 0 % (ref 0.0–0.2)

## 2020-11-17 LAB — BASIC METABOLIC PANEL
Anion gap: 9 (ref 5–15)
BUN: 20 mg/dL (ref 8–23)
CO2: 26 mmol/L (ref 22–32)
Calcium: 8.5 mg/dL — ABNORMAL LOW (ref 8.9–10.3)
Chloride: 102 mmol/L (ref 98–111)
Creatinine, Ser: 0.67 mg/dL (ref 0.44–1.00)
GFR, Estimated: >60 mL/min (ref >60)
Glucose, Bld: 135 mg/dL — ABNORMAL HIGH (ref 70–99)
Potassium: 4.3 mmol/L (ref 3.5–5.1)
Sodium: 137 mmol/L (ref 135–145)

## 2020-11-17 LAB — MAGNESIUM: Magnesium: 2 mg/dL (ref 1.7–2.4)

## 2020-11-17 MED ORDER — CEFAZOLIN SODIUM-DEXTROSE 2-4 GM/100ML-% IV SOLN
2.0000 g | Freq: Three times a day (TID) | INTRAVENOUS | Status: DC
  Filled 2020-11-17 (×3): qty 100

## 2020-11-17 MED ORDER — MAGNESIUM HYDROXIDE 400 MG/5ML PO SUSP: 30.0000 mL | Freq: Every day | ORAL | Status: DC | PRN

## 2020-11-17 MED ORDER — ONDANSETRON HCL 4 MG PO TABS: 4.0000 mg | ORAL_TABLET | Freq: Four times a day (QID) | ORAL | Status: DC | PRN

## 2020-11-17 MED ORDER — MENTHOL 3 MG MT LOZG
1.0000 | LOZENGE | OROMUCOSAL | Status: DC | PRN
Start: 2020-11-14 — End: 2020-11-19
  Filled 2020-11-17: qty 9

## 2020-11-17 MED ORDER — ONDANSETRON HCL 4 MG/2ML IJ SOLN: 4.0000 mg | Freq: Four times a day (QID) | INTRAMUSCULAR | Status: DC | PRN

## 2020-11-17 MED ORDER — ENOXAPARIN SODIUM 30 MG/0.3ML ~~LOC~~ SOLN
30.0000 mg | SUBCUTANEOUS | Status: DC
  Administered 2020-11-17 – 2020-11-18 (×2): 30 mg via SUBCUTANEOUS
  Filled 2020-11-17 (×2): qty 0.3

## 2020-11-17 MED ORDER — VITAMIN B-12 1000 MCG PO TABS
1000.0000 ug | ORAL_TABLET | Freq: Every day | ORAL | Status: DC
  Administered 2020-11-17 – 2020-11-18 (×2): 1000 ug via ORAL
  Filled 2020-11-17 (×2): qty 1

## 2020-11-17 MED ORDER — FERROUS SULFATE 325 (65 FE) MG PO TABS
325.0000 mg | ORAL_TABLET | Freq: Every day | ORAL | Status: DC
  Administered 2020-11-17 – 2020-11-18 (×2): 325 mg via ORAL
  Filled 2020-11-17 (×2): qty 1

## 2020-11-17 MED ORDER — FLEET ENEMA 7-19 GM/118ML RE ENEM: 1.0000 | ENEMA | Freq: Once | RECTAL | Status: DC | PRN

## 2020-11-17 MED ORDER — CLINDAMYCIN PHOSPHATE 600 MG/50ML IV SOLN
600.0000 mg | Freq: Three times a day (TID) | INTRAVENOUS | Status: DC
  Filled 2020-11-17 (×3): qty 50

## 2020-11-17 MED ORDER — SODIUM CHLORIDE 0.45 % IV SOLN: INTRAVENOUS | Status: DC

## 2020-11-17 MED ORDER — MECLIZINE HCL 12.5 MG PO TABS
12.5000 mg | ORAL_TABLET | Freq: Three times a day (TID) | ORAL | Status: DC | PRN
  Filled 2020-11-17: qty 1

## 2020-11-17 MED ORDER — PHENOL 1.4 % MT LIQD
1.0000 | OROMUCOSAL | Status: DC | PRN
  Filled 2020-11-17: qty 177

## 2020-11-17 MED ORDER — ZOLPIDEM TARTRATE 5 MG PO TABS
5.0000 mg | ORAL_TABLET | Freq: Every evening | ORAL | Status: DC | PRN
  Administered 2020-11-18: 5 mg via ORAL
  Filled 2020-11-17: qty 1

## 2020-11-17 MED ORDER — MORPHINE SULFATE (PF) 2 MG/ML IV SOLN: 0.5000 mg | INTRAVENOUS | Status: DC | PRN

## 2020-11-17 MED ORDER — BISACODYL 10 MG RE SUPP
10.0000 mg | Freq: Every day | RECTAL | Status: DC | PRN
  Administered 2020-11-17: 10 mg via RECTAL
  Filled 2020-11-17: qty 1

## 2020-11-17 MED ORDER — VITAMIN D3 25 MCG (1000 UNIT) PO TABS
2000.0000 [IU] | ORAL_TABLET | Freq: Every day | ORAL | Status: DC
  Administered 2020-11-17 – 2020-11-18 (×2): 2000 [IU] via ORAL
  Filled 2020-11-17 (×5): qty 2

## 2020-11-17 MED ORDER — ALUM & MAG HYDROXIDE-SIMETH 200-200-20 MG/5ML PO SUSP: 30.0000 mL | ORAL | Status: DC | PRN

## 2020-11-17 MED ORDER — METOCLOPRAMIDE HCL 10 MG PO TABS: 5.0000 mg | ORAL_TABLET | Freq: Three times a day (TID) | ORAL | Status: DC | PRN

## 2020-11-17 MED ORDER — ACETAMINOPHEN 325 MG PO TABS
325.0000 mg | ORAL_TABLET | Freq: Four times a day (QID) | ORAL | Status: DC | PRN
Start: 2020-11-17 — End: 2020-11-19
  Administered 2020-11-17: 650 mg via ORAL
  Filled 2020-11-17: qty 2

## 2020-11-17 MED ORDER — METOCLOPRAMIDE HCL 5 MG/ML IJ SOLN: 5.0000 mg | Freq: Three times a day (TID) | INTRAMUSCULAR | Status: DC | PRN

## 2020-11-17 MED ORDER — SENNA 8.6 MG PO TABS
1.0000 | ORAL_TABLET | Freq: Two times a day (BID) | ORAL | Status: DC
  Administered 2020-11-17: 8.6 mg via ORAL
  Filled 2020-11-17: qty 1

## 2020-11-17 NOTE — Progress Notes (Signed)
OT Cancellation Note  Patient Details Name: Susan [name] MRN: [id number] DOB: 09/16/1946   Cancelled Treatment:    Reason Eval/Treat Not Completed: Patient at procedure or test/ unavailable  Pt eating lunch at this time, will f/u for OT treatment at later date/time as able. Thank you.  Gerrianne Scale, [facility], OTR/L ascom [phone] 11/17/20, 12:02 PM

## 2020-11-17 NOTE — Progress Notes (Signed)
PT Cancellation Note  Patient Details Name: Susan [name] MRN: [id number] DOB: Oct 26, 1946   Cancelled Treatment:    Reason Eval/Treat Not Completed: Pain limiting ability to participate (Consult received and chart reviewed.  Patient requesting additional pain meds and requests therapist re-attempt after meds received. RN informed/aware.  Will re-attempt at later time as appropriate.)  Harneet Noblett H. Owens Shark, PT, DPT, NCS 11/17/20, 10:05 AM [phone]

## 2020-11-17 NOTE — TOC Progression Note (Signed)
Transition of Care [facility]) - Progression Note    Patient Details  Name: [name] MRN: [id number] Date of Birth: 1946-05-28  Transition of Care [facility]) CM/SW [place], LCSW Phone Number: 11/17/2020, 4:14 PM  Clinical Narrative: CSW met with patient and her husband at bedside. Provided bed offers: [facility] and [facility]. Their first preference is [facility] due to proximity to their home. Left message for admissions coordinator asking him to review and start insurance authorization if they can offer. Patient has been fully vaccinated. Per patient, she has not had a BM since Saturday but has been given stool softeners since yesterday.    Expected Discharge Plan: [place] Barriers to Discharge: Continued Medical Work up  Expected Discharge Plan and Services Expected Discharge Plan: [place] arrangements for the past 2 months: Single Family Home                                       Social Determinants of Health (SDOH) Interventions    Readmission Risk Interventions No flowsheet data found.

## 2020-11-17 NOTE — Progress Notes (Signed)
Subjective: 3 Days Post-Op Procedure(s) (LRB): INTRAMEDULLARY (IM) NAIL INTERTROCHANTRIC (Left) The patient is alert and feels better today.  Stood with PT.  Plan ongoing to skilled nursing in the next day or so.  Dressing was changed and remains dry.  Hemoglobin stable.  Patient reports pain as mild.  Objective:   VITALS:   Vitals:   11/17/20 1234 11/17/20 1548  BP: (!) 111/51 (!) 107/57  Pulse: 100 91  Resp: 16 16  Temp: 97.7 F (36.5 C) 98.4 F (36.9 C)  SpO2: 98% 97%    Neurologically intact Sensation intact distally Dorsiflexion/Plantar flexion intact Incision: dressing C/D/I  LABS Recent Labs    11/16/20 1128 11/17/20 0258  HGB 11.5* 10.7*  HCT 33.7* 32.1*  WBC 9.6 8.8  PLT 154 171    Recent Labs    11/16/20 1128 11/17/20 0258  NA 132* 137  K 3.6 4.3  BUN 18 20  CREATININE 0.66 0.67  GLUCOSE 163* 135*    No results for input(s): LABPT, INR in the last 72 hours.   Assessment/Plan: 3 Days Post-Op Procedure(s) (LRB): INTRAMEDULLARY (IM) NAIL INTERTROCHANTRIC (Left)   Advance diet Up with therapy Discharge to SNF   Partial weightbearing left leg with walker  Discharged on enteric-coated aspirin twice daily for 6 weeks  Return to clinic 2 weeks for x-ray and suture removal

## 2020-11-17 NOTE — Progress Notes (Signed)
Physical Therapy Treatment Patient Details Name: Susan [name] MRN: [id number] DOB: 16-Oct-1946 Today's Date: 11/17/2020    History of Present Illness Pt is a 75 y/o F who presented to ER secondary to mechanical fall in home environment with acute onset of L hip pain; admitted for management of closed comminuted fracture of L femur, s/p IM nailing 11/15/19, PWB    PT Comments    Progressing to sit/stand and SPT from bed/chair this date, mod assist +2 throughout.  Heavy WBing bilat UEs; limited L LE loading/weight acceptance (due to pain). Unable to fully unweight/advance LEs during transfer, preferring to scoot/slide across floor. Remains orthostatic (and symptomatic) with transition to upright-see vitals flowsheet for details.  Does recover with return to seated position. End of session BP 112/49, HR 95.  RN informed/aware.     Follow Up Recommendations  SNF     Equipment Recommendations  Rolling walker with 5" wheels;3in1 (PT)    Recommendations for Other Services       Precautions / Restrictions Precautions Precautions: Fall Restrictions Weight Bearing Restrictions: Yes LLE Weight Bearing: Partial weight bearing    Mobility  Bed Mobility Overal bed mobility: Needs Assistance Bed Mobility: Supine to Sit     Supine to sit: Mod assist;+2 for physical assistance     General bed mobility comments: very slow and guarded; incremental and deliberate movements; continues to prefer/attempt to do as indep as possible  Transfers Overall transfer level: Needs assistance Equipment used: Rolling walker (2 wheeled) Transfers: Sit to/from Stand Sit to Stand: Mod assist;+2 physical assistance         General transfer comment: cuing for foot placement, increased active use of R LE with movement transitions; heavy use of bilat UEs to complete lift off and support upright posture  Ambulation/Gait Ambulation/Gait assistance: Mod assist;+2 physical assistance Gait Distance  (Feet): 5 Feet Assistive device: Rolling walker (2 wheeled)       General Gait Details: unable to fully unweight/advance LEs, tends to slide/scoot across floor instead; very heavy WBing bilat UEs.  Limited tolerance for standing/gait due to pain, dizziness with upright   Stairs             Wheelchair Mobility    Modified Rankin (Stroke Patients Only)       Balance Overall balance assessment: Needs assistance Sitting-balance support: No upper extremity supported;Feet supported Sitting balance-Leahy Scale: Fair Sitting balance - Comments: noted to R lateral lean to decrease pressure to L hip in sitting   Standing balance support: Bilateral upper extremity supported Standing balance-Leahy Scale: Poor Standing balance comment: heavy WBing bilat UEs; decreased active use/weight acceptance L LE                            Cognition Arousal/Alertness: Awake/alert Behavior During Therapy: WFL for tasks assessed/performed Overall Cognitive Status: Within Functional Limits for tasks assessed                                        Exercises Other Exercises Other Exercises: Sit/stand x2 with RW, mod assist +2 for lift off, standing balance.  Able to acheive full postural extension this date; limited active WBing L LE.  Did incorporate L closed-chain TKE, as able, 1x10 to increase L hip/knee control and overall stability Other Exercises: Orthostatic assessment-see flowsheets for details.  continues to significant drop in BP with  transition to upright; symptomatic (resolves with return to sitting position).  RN informed/aware.    General Comments        Pertinent Vitals/Pain Pain Assessment: Faces Faces Pain Scale: Hurts even more Pain Location: L hip Pain Descriptors / Indicators: Aching;Guarding;Grimacing Pain Intervention(s): Limited activity within patient's tolerance;Premedicated before session;Repositioned;Monitored during session    Home  Living                      Prior Function            PT Goals (current goals can now be found in the care plan section) Acute Rehab PT Goals Patient Stated Goal: to be able to get up a little easier and get stronger PT Goal Formulation: With patient/family Time For Goal Achievement: 11/29/20 Potential to Achieve Goals: Good Progress towards PT goals: Progressing toward goals    Frequency    7X/week      PT Plan Current plan remains appropriate    Co-evaluation              AM-PAC PT "6 Clicks" Mobility   Outcome Measure  Help needed turning from your back to your side while in a flat bed without using bedrails?: A Lot Help needed moving from lying on your back to sitting on the side of a flat bed without using bedrails?: A Lot Help needed moving to and from a bed to a chair (including a wheelchair)?: A Lot Help needed standing up from a chair using your arms (e.g., wheelchair or bedside chair)?: A Lot Help needed to walk in hospital room?: A Lot Help needed climbing 3-5 steps with a railing? : Total 6 Click Score: 11    End of Session Equipment Utilized During Treatment: Gait belt Activity Tolerance: Patient limited by pain;Patient tolerated treatment well Patient left: in chair;with call bell/phone within reach;with chair alarm set Nurse Communication: Mobility status PT Visit Diagnosis: Muscle weakness (generalized) (M62.81);Pain;Other abnormalities of gait and mobility (R26.89) Pain - Right/Left: Left Pain - part of body: Hip     Time: 6160-7371 PT Time Calculation (min) (ACUTE ONLY): 27 min  Charges:  $Therapeutic Activity: 23-37 mins                     Shaeleigh Graw H. Owens Shark, PT, DPT, NCS 11/17/20, 3:02 PM [phone]

## 2020-11-17 NOTE — Progress Notes (Signed)
PROGRESS NOTE    Susan [name]  JKK:[id number] DOB: [birth year] DOA: 11/13/2020 PCP: Tonia Ghent, MD   Chief complaint.  Hip pain Brief Narrative:   patient was admitted 11/13/2020 after a fall and found to have a hip fracture.  Dr. Sabra Heck orthopedic surgery took to the operating room on 11/14/2020 for intramedullary nail intertrochanteric left hip procedure.  Patient's blood pressure was a little bit low and required a fluid bolus and holding her antihypertensive medications.  Out to rehab once insurance company authorizes.  Assessment & Plan:   Principal Problem:   Closed comminuted intertrochanteric fracture of left femur (HCC) Active Problems:   Essential hypertension   Osteoporosis   Irritable bowel syndrome   Non-alcoholic fatty liver disease   CAD in native artery   Valvular heart disease   Chronic Grade I diastolic CHF on echocardiogram 06/08/20 [facility])   Accidental fall   Preoperative clearance   Closed left hip fracture, initial encounter (HCC)   Tricuspid valve insufficiency   IBD (inflammatory bowel disease)   NASH (nonalcoholic steatohepatitis)   Orthostatic hypotension   Hypokalemia   Hyponatremia  #1. orthostatic hypotension. Continue hold all blood pressure medicines.  2.  Left femoral neck fracture.  S/p surgical repair. Patient doing well.  Pain controlled well.  3.  Moderate to severe tricuspid regurgitation. Condition stable.  No evidence of congestive heart failure exacerbation.  #4.  Essential hypertension. Hold all blood pressure medicines.  5.  Hypokalemia and hyponatremia. Condition stabilized.    DVT prophylaxis: Lovenox Code Status: Full Family Communication: None Disposition Plan:  .   Status is: Inpatient  Remains inpatient appropriate because:Unsafe d/c plan   Dispo: The patient is from: Home              Anticipated d/c is to: SNF              Anticipated d/c date is: 1 day              Patient currently is medically  stable to d/c.        I/O last 3 completed shifts: In: 604 [P.O.:120; I.V.:484] Out: 2200 [Urine:2200] Total I/O In: 240 [P.O.:240] Out: 400 [Urine:400]     Consultants:   orthopedics  Procedures: Hip  Antimicrobials: None  Subjective: Patient doing well today.  She slept well last night.  Currently she denies any short of breath or cough. She still has some constipation, has not had a bowel movement for the last 3 or 4 days.  She does not have abdominal pain or nausea vomiting. No dysuria or hematuria P No fever or chills.  Objective: Vitals:   11/16/20 2305 11/17/20 0358 11/17/20 0753 11/17/20 1234  BP: (!) 99/59 (!) 125/49 (!) 115/56 (!) 111/51  Pulse: 99 (!) 101 93 100  Resp: 16 16 17 16   Temp: 98.5 F (36.9 C) 98 F (36.7 C) 98.8 F (37.1 C) 97.7 F (36.5 C)  TempSrc:      SpO2: 97% 94% 95% 98%  Weight:      Height:        Intake/Output Summary (Last 24 hours) at 11/17/2020 1325 Last data filed at 11/17/2020 1231 Gross per 24 hour  Intake 844 ml  Output 1900 ml  Net -1056 ml   Filed Weights   11/13/20 2126  Weight: 66.7 kg    Examination:  General exam: Appears calm and comfortable  Respiratory system: Clear to auscultation. Respiratory effort normal. Cardiovascular system: S1 & S2 heard,  RRR. No JVD, murmurs, rubs, gallops or clicks. No pedal edema. Gastrointestinal system: Abdomen is nondistended, soft and nontender. No organomegaly or masses felt. Normal bowel sounds heard. Central nervous system: Alert and oriented. No focal neurological deficits. Extremities: Symmetric 5 x 5 power. Skin: No rashes, lesions or ulcers Psychiatry: Judgement and insight appear normal. Mood & affect appropriate.     Data Reviewed: I have personally reviewed following labs and imaging studies  CBC: Recent Labs  Lab 11/13/20 2130 11/14/20 0442 11/16/20 1128 11/17/20 0258  WBC 8.0 9.8 9.6 8.8  NEUTROABS 5.0  --   --   --   HGB 13.1 12.1 11.5* 10.7*   HCT 39.3 36.0 33.7* 32.1*  MCV 93.1 92.8 93.4 94.1  PLT 174 166 154 056   Basic Metabolic Panel: Recent Labs  Lab 11/13/20 2130 11/14/20 0442 11/16/20 1128 11/17/20 0258  NA 138 137 132* 137  K 4.0 4.0 3.6 4.3  CL 103 103 96* 102  CO2 23 23 25 26   GLUCOSE 122* 172* 163* 135*  BUN 23 23 18 20   CREATININE 0.79 0.72 0.66 0.67  CALCIUM 9.7 9.0 9.0 8.5*  MG  --   --   --  2.0   GFR: Estimated Creatinine Clearance: 53.1 mL/min (by C-G formula based on SCr of 0.67 mg/dL). Liver Function Tests: No results for input(s): AST, ALT, ALKPHOS, BILITOT, PROT, ALBUMIN in the last 168 hours. No results for input(s): LIPASE, AMYLASE in the last 168 hours. No results for input(s): AMMONIA in the last 168 hours. Coagulation Profile: Recent Labs  Lab 11/13/20 2130  INR 1.1   Cardiac Enzymes: No results for input(s): CKTOTAL, CKMB, CKMBINDEX, TROPONINI in the last 168 hours. BNP (last 3 results) No results for input(s): PROBNP in the last 8760 hours. HbA1C: No results for input(s): HGBA1C in the last 72 hours. CBG: No results for input(s): GLUCAP in the last 168 hours. Lipid Profile: No results for input(s): CHOL, HDL, LDLCALC, TRIG, CHOLHDL, LDLDIRECT in the last 72 hours. Thyroid Function Tests: No results for input(s): TSH, T4TOTAL, FREET4, T3FREE, THYROIDAB in the last 72 hours. Anemia Panel: No results for input(s): VITAMINB12, FOLATE, FERRITIN, TIBC, IRON, RETICCTPCT in the last 72 hours. Sepsis Labs: No results for input(s): PROCALCITON, LATICACIDVEN in the last 168 hours.  Recent Results (from the past 240 hour(s))  Resp Panel by RT-PCR (Flu A&B, Covid) Nasopharyngeal Swab     Status: None   Collection Time: 11/13/20 10:22 PM   Specimen: Nasopharyngeal Swab; Nasopharyngeal(NP) swabs in vial transport medium  Result Value Ref Range Status   SARS Coronavirus 2 by RT PCR NEGATIVE NEGATIVE Final    Comment: (NOTE) SARS-CoV-2 target nucleic acids are NOT DETECTED.  The  SARS-CoV-2 RNA is generally detectable in upper respiratory specimens during the acute phase of infection. The lowest concentration of SARS-CoV-2 viral copies this assay can detect is 138 copies/mL. A negative result does not preclude SARS-Cov-2 infection and should not be used as the sole basis for treatment or other patient management decisions. A negative result may occur with  improper specimen collection/handling, submission of specimen other than nasopharyngeal swab, presence of viral mutation(s) within the areas targeted by this assay, and inadequate number of viral copies(<138 copies/mL). A negative result must be combined with clinical observations, patient history, and epidemiological information. The expected result is Negative.  Fact Sheet for Patients:  EntrepreneurPulse.com.au  Fact Sheet for Healthcare Providers:  IncredibleEmployment.be  This test is no t yet approved or cleared by  the [employer] and  has been authorized for detection and/or diagnosis of SARS-CoV-2 by FDA under an Emergency Use Authorization (EUA). This EUA will remain  in effect (meaning this test can be used) for the duration of the COVID-19 declaration under Section 564(b)(1) of the Act, 21 U.S.C.section 360bbb-3(b)(1), unless the authorization is terminated  or revoked sooner.       Influenza A by PCR NEGATIVE NEGATIVE Final   Influenza B by PCR NEGATIVE NEGATIVE Final    Comment: (NOTE) The Xpert Xpress SARS-CoV-2/FLU/RSV plus assay is intended as an aid in the diagnosis of influenza from Nasopharyngeal swab specimens and should not be used as a sole basis for treatment. Nasal washings and aspirates are unacceptable for Xpert Xpress SARS-CoV-2/FLU/RSV testing.  Fact Sheet for Patients: EntrepreneurPulse.com.au  Fact Sheet for Healthcare Providers: IncredibleEmployment.be  This test is not yet approved or  cleared by the [place] FDA and has been authorized for detection and/or diagnosis of SARS-CoV-2 by FDA under an Emergency Use Authorization (EUA). This EUA will remain in effect (meaning this test can be used) for the duration of the COVID-19 declaration under Section 564(b)(1) of the Act, 21 U.S.C. section 360bbb-3(b)(1), unless the authorization is terminated or revoked.  Performed at [facility], [address], [place], [place] [address]          Radiology Studies: No results found.      Scheduled Meds: . aspirin EC  81 mg Oral Daily  . cholecalciferol  2,000 Units Oral Daily  . enoxaparin (LOVENOX) injection  30 mg Subcutaneous Q24H  . ezetimibe  10 mg Oral Daily  . feeding supplement  237 mL Oral BID BM  . ferrous sulfate  325 mg Oral Q breakfast  . multivitamin with minerals  1 tablet Oral Daily  . polyethylene glycol  17 g Oral Daily  . senna  1 tablet Oral BID  . senna-docusate  2 tablet Oral QHS  . vitamin B-12  1,000 mcg Oral Daily   Continuous Infusions: . sodium chloride    . sodium chloride 30 mL/hr at 11/16/20 1420  .  ceFAZolin (ANCEF) IV    . clindamycin (CLEOCIN) IV       LOS: 4 days    Time spent: 74 miinutes    Sharen Hones, MD [facility]   To contact the attending provider between 7A-7P or the covering provider during after hours 7P-7A, please log into the web site [email or web address] and access using universal [place] password for that web site. If you do not have the password, please call the hospital operator.  11/17/2020, 1:25 PM

## 2020-11-17 NOTE — Care Management Important Message (Signed)
Important Message  Patient Details  Name: Susan [name] MRN: [id number] Date of Birth: 1946-02-10   Medicare Important Message Given:  Yes     [name] 11/17/2020, 10:45 AM

## 2020-11-18 DIAGNOSIS — W19XXXA Unspecified fall, initial encounter: Secondary | ICD-10-CM | POA: Diagnosis not present

## 2020-11-18 DIAGNOSIS — I10 Essential (primary) hypertension: Secondary | ICD-10-CM | POA: Diagnosis not present

## 2020-11-18 DIAGNOSIS — E785 Hyperlipidemia, unspecified: Secondary | ICD-10-CM | POA: Diagnosis not present

## 2020-11-18 DIAGNOSIS — I951 Orthostatic hypotension: Secondary | ICD-10-CM | POA: Diagnosis not present

## 2020-11-18 DIAGNOSIS — D509 Iron deficiency anemia, unspecified: Secondary | ICD-10-CM | POA: Diagnosis not present

## 2020-11-18 DIAGNOSIS — S72142D Displaced intertrochanteric fracture of left femur, subsequent encounter for closed fracture with routine healing: Secondary | ICD-10-CM | POA: Diagnosis not present

## 2020-11-18 DIAGNOSIS — Z7401 Bed confinement status: Secondary | ICD-10-CM | POA: Diagnosis not present

## 2020-11-18 DIAGNOSIS — Z7189 Other specified counseling: Secondary | ICD-10-CM | POA: Diagnosis not present

## 2020-11-18 DIAGNOSIS — I5030 Unspecified diastolic (congestive) heart failure: Secondary | ICD-10-CM | POA: Diagnosis not present

## 2020-11-18 DIAGNOSIS — I5032 Chronic diastolic (congestive) heart failure: Secondary | ICD-10-CM | POA: Diagnosis not present

## 2020-11-18 DIAGNOSIS — S72002D Fracture of unspecified part of neck of left femur, subsequent encounter for closed fracture with routine healing: Secondary | ICD-10-CM | POA: Diagnosis not present

## 2020-11-18 DIAGNOSIS — S72142S Displaced intertrochanteric fracture of left femur, sequela: Secondary | ICD-10-CM | POA: Diagnosis not present

## 2020-11-18 DIAGNOSIS — R42 Dizziness and giddiness: Secondary | ICD-10-CM | POA: Diagnosis not present

## 2020-11-18 DIAGNOSIS — M80052D Age-related osteoporosis with current pathological fracture, left femur, subsequent encounter for fracture with routine healing: Secondary | ICD-10-CM | POA: Diagnosis not present

## 2020-11-18 DIAGNOSIS — M2550 Pain in unspecified joint: Secondary | ICD-10-CM | POA: Diagnosis not present

## 2020-11-18 DIAGNOSIS — R5381 Other malaise: Secondary | ICD-10-CM | POA: Diagnosis not present

## 2020-11-18 DIAGNOSIS — M6281 Muscle weakness (generalized): Secondary | ICD-10-CM | POA: Diagnosis not present

## 2020-11-18 LAB — CBC
HCT: 32 % — ABNORMAL LOW (ref 36.0–46.0)
Hemoglobin: 11 g/dL — ABNORMAL LOW (ref 12.0–15.0)
MCH: 31.7 pg (ref 26.0–34.0)
MCHC: 34.4 g/dL (ref 30.0–36.0)
MCV: 92.2 fL (ref 80.0–100.0)
Platelets: 200 10*3/uL (ref 150–400)
RBC: 3.47 MIL/uL — ABNORMAL LOW (ref 3.87–5.11)
RDW: 13.4 % (ref 11.5–15.5)
WBC: 7.8 10*3/uL (ref 4.0–10.5)
nRBC: 0 % (ref 0.0–0.2)

## 2020-11-18 LAB — BASIC METABOLIC PANEL
Anion gap: 9 (ref 5–15)
BUN: 22 mg/dL (ref 8–23)
CO2: 24 mmol/L (ref 22–32)
Calcium: 8.4 mg/dL — ABNORMAL LOW (ref 8.9–10.3)
Chloride: 100 mmol/L (ref 98–111)
Creatinine, Ser: 0.56 mg/dL (ref 0.44–1.00)
GFR, Estimated: >60 mL/min (ref >60)
Glucose, Bld: 113 mg/dL — ABNORMAL HIGH (ref 70–99)
Potassium: 4 mmol/L (ref 3.5–5.1)
Sodium: 133 mmol/L — ABNORMAL LOW (ref 135–145)

## 2020-11-18 LAB — RESP PANEL BY RT-PCR (FLU A&B, COVID) ARPGX2
Influenza A by PCR: NEGATIVE
Influenza B by PCR: NEGATIVE
SARS Coronavirus 2 by RT PCR: NEGATIVE

## 2020-11-18 MED ORDER — FERROUS SULFATE 325 (65 FE) MG PO TABS: 325.0000 mg | ORAL_TABLET | Freq: Every day | ORAL | 0 refills | Status: DC

## 2020-11-18 MED ORDER — HYDROCODONE-ACETAMINOPHEN 5-325 MG PO TABS: 1.0000 | ORAL_TABLET | Freq: Four times a day (QID) | ORAL | 0 refills | Status: DC | PRN

## 2020-11-18 MED ORDER — ENOXAPARIN SODIUM 30 MG/0.3ML ~~LOC~~ SOLN: 30.0000 mg | SUBCUTANEOUS | Status: DC

## 2020-11-18 MED ORDER — ENSURE ENLIVE PO LIQD: 237.0000 mL | Freq: Two times a day (BID) | ORAL | 0 refills | Status: DC

## 2020-11-18 NOTE — Progress Notes (Signed)
rEPORT CALLED TO RN AT [facility]

## 2020-11-18 NOTE — TOC Transition Note (Signed)
Transition of Care [facility]) - CM/SW Discharge Note   Patient Details  Name: Susan [name] MRN: [id number] Date of Birth: August 21, 1946  Transition of Care [facility]) CM/SW Contact:  Candie Chroman, LCSW Phone Number: 11/18/2020, 4:39 PM   Clinical Narrative:  Patient has orders to discharge to [facility] today. RN will call report to [phone] (Room 605A). Transport has been arranged. There are a few people in front of her. No further concerns. CSW signing off.  Final next level of care: Skilled Nursing Facility Barriers to Discharge: Barriers Resolved   Patient Goals and CMS Choice Patient states their goals for this hospitalization and ongoing recovery are:: SNF rehab CMS Medicare.gov Compare Post Acute Care list provided to:: Patient Choice offered to / list presented to : Patient,Spouse  Discharge Placement PASRR number recieved: 11/16/20            Patient chooses bed at: [facility] [place] Patient to be transferred to facility by: EMS Name of family member notified: Susan [name] Patient and family notified of of transfer: 11/18/20  Discharge Plan and Services                                     Social Determinants of Health (SDOH) Interventions     Readmission Risk Interventions No flowsheet data found.

## 2020-11-18 NOTE — TOC Progression Note (Signed)
Transition of Care [facility]) - Progression Note    Patient Details  Name: Susan [name] MRN: [id number] Date of Birth: 12-03-1945  Transition of Care [facility]) CM/SW Anthony, LCSW Phone Number: 11/18/2020, 9:34 AM  Clinical Narrative:  [facility] admissions coordinator is confirming bed availability for today.   Expected Discharge Plan: [place] Barriers to Discharge: Continued Medical Work up  Expected Discharge Plan and Services Expected Discharge Plan: [place] arrangements for the past 2 months: Single Family Home                                       Social Determinants of Health (SDOH) Interventions    Readmission Risk Interventions No flowsheet data found.

## 2020-11-18 NOTE — Progress Notes (Signed)
  Subjective:  POD #4 s/p left intramedullary fixation for intertrochanteric hip x-ray.   Patient reports left hip pain as mild.  Patient states she is doing well and is being discharged to rehab today.  Objective:   VITALS:   Vitals:   11/18/20 0031 11/18/20 0455 11/18/20 0807 11/18/20 1205  BP: 116/60 (!) 109/55 (!) 113/50 112/61  Pulse: (!) 103 99 86 88  Resp: 18 18 16 18   Temp: 98.8 F (37.1 C) 98.5 F (36.9 C) 98.1 F (36.7 C) 97.9 F (36.6 C)  TempSrc: Oral Oral    SpO2: 96% 95% 96% 97%  Weight:      Height:        PHYSICAL EXAM: Left lower extremity Neurovascular intact Sensation intact distally Intact pulses distally Dorsiflexion/Plantar flexion intact Incision: scant drainage No cellulitis present Compartment soft  LABS  Results for orders placed or performed during the hospital encounter of 11/13/20 (from the past 24 hour(s))  CBC     Status: Abnormal   Collection Time: 11/18/20  1:38 AM  Result Value Ref Range   WBC 7.8 4.0 - 10.5 K/uL   RBC 3.47 (L) 3.87 - 5.11 MIL/uL   Hemoglobin 11.0 (L) 12.0 - 15.0 g/dL   HCT 32.0 (L) 36.0 - 46.0 %   MCV 92.2 80.0 - 100.0 fL   MCH 31.7 26.0 - 34.0 pg   MCHC 34.4 30.0 - 36.0 g/dL   RDW 13.4 11.5 - 15.5 %   Platelets 200 150 - 400 K/uL   nRBC 0.0 0.0 - 0.2 %  Basic metabolic panel     Status: Abnormal   Collection Time: 11/18/20  1:38 AM  Result Value Ref Range   Sodium 133 (L) 135 - 145 mmol/L   Potassium 4.0 3.5 - 5.1 mmol/L   Chloride 100 98 - 111 mmol/L   CO2 24 22 - 32 mmol/L   Glucose, Bld 113 (H) 70 - 99 mg/dL   BUN 22 8 - 23 mg/dL   Creatinine, Ser 0.56 0.44 - 1.00 mg/dL   Calcium 8.4 (L) 8.9 - 10.3 mg/dL   GFR, Estimated >60 >60 mL/min   Anion gap 9 5 - 15    No results found.  Assessment/Plan: 4 Days Post-Op   Principal Problem:   Closed comminuted intertrochanteric fracture of left femur (HCC) Active Problems:   Essential hypertension   Osteoporosis   Irritable bowel syndrome    Non-alcoholic fatty liver disease   CAD in native artery   Valvular heart disease   Chronic Grade I diastolic CHF on echocardiogram 06/08/20 [facility])   Accidental fall   Preoperative clearance   Closed left hip fracture, initial encounter ([place])   Tricuspid valve insufficiency   IBD (inflammatory bowel disease)   NASH (nonalcoholic steatohepatitis)   Orthostatic hypotension   Hypokalemia   Hyponatremia  Patient is doing well from orthopedic standpoint.  She will follow-up with Dr. Sabra Heck in 10 to 14 days after discharge.  Patient will continue physical therapy.  Dr. Sabra Heck has recommended discharge on enteric-coated aspirin 325 mg twice daily x6 weeks.   Thornton Park , MD 11/18/2020, 1:45 PM

## 2020-11-18 NOTE — TOC Progression Note (Signed)
Transition of Care [facility]) - Progression Note    Patient Details  Name: Susan [name] MRN: [id number] Date of Birth: 10/28/1946  Transition of Care [facility]) CM/SW Nixon, LCSW Phone Number: 11/18/2020, 12:14 PM  Clinical Narrative: [facility] is able to offer patient a bed today. Patient and husband are aware and agreeable.    Expected Discharge Plan: [place] Barriers to Discharge: Continued Medical Work up  Expected Discharge Plan and Services Expected Discharge Plan: [place] arrangements for the past 2 months: Single Family Home Expected Discharge Date: 11/18/20                                     Social Determinants of Health (SDOH) Interventions    Readmission Risk Interventions No flowsheet data found.

## 2020-11-18 NOTE — Discharge Summary (Signed)
Physician Discharge Summary  Patient ID: Susan [name] MRN: [id number] DOB/AGE: 12/11/1945 75 y.o.  Admit date: 11/13/2020 Discharge date: 11/18/2020  Admission Diagnoses:  Discharge Diagnoses:  Principal Problem:   Closed comminuted intertrochanteric fracture of left femur [facility]) Active Problems:   Essential hypertension   Osteoporosis   Irritable bowel syndrome   Non-alcoholic fatty liver disease   CAD in native artery   Valvular heart disease   Chronic Grade I diastolic CHF on echocardiogram 06/08/20 [facility])   Accidental fall   Preoperative clearance   Closed left hip fracture, initial encounter ([place])   Tricuspid valve insufficiency   IBD (inflammatory bowel disease)   NASH (nonalcoholic steatohepatitis)   Orthostatic hypotension   Hypokalemia   Hyponatremia   Discharged Condition: good  Hospital Course:  patient was admitted 1/8/2022after a fall and found to have a hip fracture. Dr. Sabra Heck orthopedic surgery took to the operating room on 11/14/2020 for intramedullary nail intertrochanteric left hip procedure. Patient's blood pressure was a little bit low and required a fluid bolus and holding her antihypertensive medications. Out to rehab once insurance company authorizes.  #1. orthostatic hypotension. Continue hold all blood pressure medicines until seen by PCP.  2.  Left femoral neck fracture.  S/p surgical repair. Patient doing well.  Pain controlled well.  3.  Moderate to severe tricuspid regurgitation. Condition stable.  No evidence of congestive heart failure exacerbation.  #4.  Essential hypertension. Hold all blood pressure medicines.  5.  Hypokalemia and hyponatremia. Condition stabilized.    Consults: orthopedic surgery  Significant Diagnostic Studies:   Treatments: Surgery  Discharge Exam: Blood pressure (!) 113/50, pulse 86, temperature 98.1 F (36.7 C), resp. rate 16, height 5' 1"  (1.549 m), weight 66.7 kg, SpO2 96 %. General  appearance: alert and cooperative Resp: clear to auscultation bilaterally Cardio: regular rate and rhythm, S1, S2 normal, no murmur, click, rub or gallop GI: soft, non-tender; bowel sounds normal; no masses,  no organomegaly Extremities: extremities normal, atraumatic, no cyanosis or edema  Disposition: Discharge disposition: 03-Skilled Nursing Facility       Discharge Instructions    Diet - low sodium heart healthy   Complete by: As directed    Discharge wound care:   Complete by: As directed    RN follow,   Increase activity slowly   Complete by: As directed      Allergies as of 11/18/2020      Reactions   Lipitor [atorvastatin Calcium] Other (See Comments)   Aches on med at 50m a day.    Penicillins Swelling, Rash   Rash and "swelling" in high school. Tolerated cefazolin 11/14/2020   Sulfonamide Derivatives Rash      Medication List    STOP taking these medications   lisinopril-hydrochlorothiazide 20-25 MG tablet Commonly known as: ZESTORETIC     TAKE these medications   acetaminophen 500 MG tablet Commonly known as: TYLENOL Take 1,000 mg by mouth 2 (two) times daily.   aspirin 81 MG tablet Take 81 mg by mouth daily.   Cholecalciferol 25 MCG (1000 UT) tablet Take 2 tablets (2,000 Units total) by mouth daily.   enoxaparin 30 MG/0.3ML injection Commonly known as: LOVENOX Inject 0.3 mLs (30 mg total) into the skin daily for 14 days. Start taking on: November 19, 2020   ezetimibe 10 MG tablet Commonly known as: ZETIA Take 1 tablet (10 mg total) by mouth daily.   feeding supplement Liqd Take 237 mLs by mouth 2 (two) times daily between meals.  ferrous sulfate 325 (65 FE) MG tablet Take 1 tablet (325 mg total) by mouth daily with breakfast. Start taking on: November 19, 2020   HYDROcodone-acetaminophen 5-325 MG tablet Commonly known as: NORCO/VICODIN Take 1-2 tablets by mouth every 6 (six) hours as needed for moderate pain.   meclizine 12.5 MG  tablet Commonly known as: ANTIVERT Take 1 tablet (12.5 mg total) by mouth 3 (three) times daily as needed for dizziness.   multivitamin-lutein Caps capsule Take 1 capsule by mouth daily. Taking 1 tablet daily   vitamin B-12 1000 MCG tablet Commonly known as: CYANOCOBALAMIN Take 1,000 mcg by mouth daily.            Discharge Care Instructions  (From admission, onward)         Start     Ordered   11/18/20 0000  Discharge wound care:       Comments: RN follow,   11/18/20 1150          Follow-up Information    Earnestine Leys, MD. Schedule an appointment as soon as possible for a visit in 2 week(s).   Specialty: Orthopedic Surgery Why: Please make appointment prior to patient's discharge.  Thank you Contact information: [place] [address] [phone]        Tonia Ghent, MD Follow up in 1 week(s).   Specialty: Family Medicine Contact information: [place] [address] [phone]        Nelva Bush, MD .   Specialty: Cardiology Contact information: [place] [address] [phone]               Signed: Sharen Hones 11/18/2020, 11:53 AM

## 2020-11-18 NOTE — Plan of Care (Signed)

## 2020-11-19 DIAGNOSIS — M6281 Muscle weakness (generalized): Secondary | ICD-10-CM | POA: Diagnosis not present

## 2020-11-19 DIAGNOSIS — I5030 Unspecified diastolic (congestive) heart failure: Secondary | ICD-10-CM | POA: Diagnosis not present

## 2020-11-19 DIAGNOSIS — R42 Dizziness and giddiness: Secondary | ICD-10-CM | POA: Diagnosis not present

## 2020-11-19 DIAGNOSIS — M80052D Age-related osteoporosis with current pathological fracture, left femur, subsequent encounter for fracture with routine healing: Secondary | ICD-10-CM | POA: Diagnosis not present

## 2020-11-19 DIAGNOSIS — D509 Iron deficiency anemia, unspecified: Secondary | ICD-10-CM | POA: Diagnosis not present

## 2020-11-19 DIAGNOSIS — E785 Hyperlipidemia, unspecified: Secondary | ICD-10-CM | POA: Diagnosis not present

## 2020-11-25 ENCOUNTER — Ambulatory Visit: Payer: Medicare HMO | Admitting: Physician Assistant

## 2020-11-30 ENCOUNTER — Inpatient Hospital Stay: Payer: Medicare HMO | Admitting: Family Medicine

## 2020-12-13 DIAGNOSIS — S72142D Displaced intertrochanteric fracture of left femur, subsequent encounter for closed fracture with routine healing: Secondary | ICD-10-CM | POA: Diagnosis not present

## 2020-12-13 DIAGNOSIS — M6281 Muscle weakness (generalized): Secondary | ICD-10-CM | POA: Diagnosis not present

## 2020-12-13 DIAGNOSIS — R42 Dizziness and giddiness: Secondary | ICD-10-CM | POA: Diagnosis not present

## 2020-12-13 DIAGNOSIS — I5030 Unspecified diastolic (congestive) heart failure: Secondary | ICD-10-CM | POA: Diagnosis not present

## 2020-12-13 DIAGNOSIS — E785 Hyperlipidemia, unspecified: Secondary | ICD-10-CM | POA: Diagnosis not present

## 2020-12-13 DIAGNOSIS — M80052D Age-related osteoporosis with current pathological fracture, left femur, subsequent encounter for fracture with routine healing: Secondary | ICD-10-CM | POA: Diagnosis not present

## 2020-12-14 ENCOUNTER — Other Ambulatory Visit: Payer: Self-pay

## 2020-12-14 DIAGNOSIS — M6281 Muscle weakness (generalized): Secondary | ICD-10-CM | POA: Diagnosis not present

## 2020-12-14 DIAGNOSIS — S72002A Fracture of unspecified part of neck of left femur, initial encounter for closed fracture: Secondary | ICD-10-CM | POA: Diagnosis not present

## 2020-12-14 NOTE — Patient Outreach (Signed)
[facility]) Care Management  12/14/2020  TAYJA MANZER 26-Aug-1946 [id number]   Referral Date: 12/14/20 Referral Source: [employer] Report Date of Discharge: 12/13/20 Facility:  [facility] Insurance: [facility]   Referral received.  No outreach warranted at this time.  Transition of Care calls being completed via EMMI. RN CM will outreach patient for any red flags received.    Plan: RN CM will close case.    Jone Baseman, RN, MSN [facility] Care Management Care Management Coordinator Direct Line [phone] Toll Free: [phone]  Fax: [phone]

## 2020-12-16 ENCOUNTER — Telehealth: Payer: Self-pay | Admitting: Internal Medicine

## 2020-12-16 NOTE — Telephone Encounter (Signed)
Was able to get in touch with Susan [name], she wanted to know if she needed to continue her lisinopril that she was on before her hip sx back around 11/14/2019. She did not take it while in rehab at [facility]. Records shows that Dr. Roosevelt Locks had d/c medication due to hypotension/orthostatic hypotension. Pt reports since she has been off the medication she has been feeling great, BP at the facility was "within normal ranges all the time". Pt reports since she has been off the medication she has noticed "no more dizzy spells and not dizzy when I get up in the mornings like when I use to be on that medication", pt reports "I feel great". Pt reports has been taking her BP at home, no log, but reports "they have been within the normal ranges and the lower side of normal" Pt feels she is doing well without the daily lisinopril. Advised pt she can continue to stay off medication if handling it well and feels great. Remind pt to continue to take her BP daily, if she noticed a trend in elevated BP and start having CP, headaches, or dizziness then call clinic for a sooner f/u appt to re-discuss medications. Pt verbalized understanding. Otherwise all questions or concerns were address and no additional concerns at this time. Agreeable to plan, will call back for anything further.

## 2020-12-16 NOTE — Telephone Encounter (Signed)
Patient calling in after a SNF stay for hip surgery. Patient was taken on lisinopril while in SNF. Patient is wanting to know if she should restart this medication. Patient reports feel better with out it and a normal BP as well.  Please advise

## 2020-12-17 DIAGNOSIS — I10 Essential (primary) hypertension: Secondary | ICD-10-CM | POA: Diagnosis not present

## 2020-12-17 DIAGNOSIS — E569 Vitamin deficiency, unspecified: Secondary | ICD-10-CM | POA: Diagnosis not present

## 2020-12-17 DIAGNOSIS — E785 Hyperlipidemia, unspecified: Secondary | ICD-10-CM | POA: Diagnosis not present

## 2020-12-17 DIAGNOSIS — M80052D Age-related osteoporosis with current pathological fracture, left femur, subsequent encounter for fracture with routine healing: Secondary | ICD-10-CM | POA: Diagnosis not present

## 2020-12-17 DIAGNOSIS — Z7982 Long term (current) use of aspirin: Secondary | ICD-10-CM | POA: Diagnosis not present

## 2020-12-17 DIAGNOSIS — D509 Iron deficiency anemia, unspecified: Secondary | ICD-10-CM | POA: Diagnosis not present

## 2020-12-20 ENCOUNTER — Encounter (INDEPENDENT_AMBULATORY_CARE_PROVIDER_SITE_OTHER): Payer: Self-pay | Admitting: Ophthalmology

## 2020-12-20 ENCOUNTER — Ambulatory Visit (INDEPENDENT_AMBULATORY_CARE_PROVIDER_SITE_OTHER): Payer: Medicare HMO | Admitting: Ophthalmology

## 2020-12-20 ENCOUNTER — Other Ambulatory Visit: Payer: Self-pay

## 2020-12-20 DIAGNOSIS — H353221 Exudative age-related macular degeneration, left eye, with active choroidal neovascularization: Secondary | ICD-10-CM

## 2020-12-20 MED ORDER — EZETIMIBE 10 MG PO TABS: 10.0000 mg | ORAL_TABLET | Freq: Every day | ORAL | 3 refills | Status: DC

## 2020-12-20 MED ORDER — BEVACIZUMAB 2.5 MG/0.1ML IZ SOSY
2.5000 mg | PREFILLED_SYRINGE | INTRAVITREAL | Status: AC | PRN
  Administered 2020-12-20: 2.5 mg via INTRAVITREAL

## 2020-12-20 NOTE — Progress Notes (Signed)
12/20/2020     CHIEF COMPLAINT Patient presents for Retina Follow Up (23 WK FU OU, POSS AVASTIN OS///Pt reports stable vision and improvement with spots in vision. Pt denies any new F/F, pain, or pressure OU. )   HISTORY OF PRESENT ILLNESS: Susan [name] is a 75 y.o. 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  This started 9 weeks ago.  Duration of 9 weeks.  Since onset it is stable. Additional comments: 9 WK FU OU, POSS AVASTIN OS   Pt reports stable vision and improvement with spots in vision. Pt denies any new F/F, pain, or pressure OU.        Last edited by Nichola Sizer D on 12/20/2020 10:51 AM. (History)      Referring physician: Tonia Ghent, MD [place],  [place] [address]  HISTORICAL INFORMATION:   Selected notes from the MEDICAL RECORD NUMBER    Lab Results  Component Value Date   HGBA1C 5.5 11/14/2020     CURRENT MEDICATIONS: No current outpatient medications on file. (Ophthalmic Drugs)   No current facility-administered medications for this visit. (Ophthalmic Drugs)   Current Outpatient Medications (Other)  Medication Sig  . acetaminophen (TYLENOL) 500 MG tablet Take 1,000 mg by mouth 2 (two) times daily.  Marland Kitchen aspirin 81 MG tablet Take 81 mg by mouth daily.  . Cholecalciferol 1000 units tablet Take 2 tablets (2,000 Units total) by mouth daily.  Marland Kitchen enoxaparin (LOVENOX) 30 MG/0.3ML injection Inject 0.3 mLs (30 mg total) into the skin daily for 14 days.  Marland Kitchen ezetimibe (ZETIA) 10 MG tablet Take 1 tablet (10 mg total) by mouth daily.  . feeding supplement (ENSURE ENLIVE / ENSURE PLUS) LIQD Take 237 mLs by mouth 2 (two) times daily between meals.  . ferrous sulfate 325 (65 FE) MG tablet Take 1 tablet (325 mg total) by mouth daily with breakfast.  . HYDROcodone-acetaminophen (NORCO/VICODIN) 5-325 MG tablet Take 1-2 tablets by mouth every 6 (six) hours as needed  for moderate pain.  . meclizine (ANTIVERT) 12.5 MG tablet Take 1 tablet (12.5 mg total) by mouth 3 (three) times daily as needed for dizziness.  . multivitamin-lutein (OCUVITE-LUTEIN) CAPS capsule Take 1 capsule by mouth daily. Taking 1 tablet daily (Patient not taking: Reported on 08/30/2020)  . vitamin B-12 (CYANOCOBALAMIN) 1000 MCG tablet Take 1,000 mcg by mouth daily.   No current facility-administered medications for this visit. (Other)      REVIEW OF SYSTEMS:    ALLERGIES Allergies  Allergen Reactions  . Lipitor [Atorvastatin Calcium] Other (See Comments)    Aches on med at 66m a day.   .Marland KitchenPenicillins Swelling and Rash    Rash and "swelling" in high school. Tolerated cefazolin 11/14/2020   . Sulfonamide Derivatives Rash    PAST MEDICAL HISTORY Past Medical History:  Diagnosis Date  . Colon cancer screening   . Diverticulosis of colon   . Hyperlipidemia   . Hypertension   . IBS (irritable bowel syndrome)   . 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 Macular degeneration   . Migraine, unspecified, without mention of intractable migraine without mention of status migrainosus   . NASH (nonalcoholic steatohepatitis)   . Nonobstructive atherosclerosis of coronary artery 2016   Cardiac CTA with mild proximal LAD disease; CAC score 74.  .Marland KitchenOcular migraine   . Osteoporosis  on Fosamax for 5 years  . Other abnormal glucose   . Other chronic nonalcoholic liver disease    LFTs normalized with weight loss 2013  . Other screening mammogram   . Symptomatic menopausal or female climacteric states   . Thyroid disease   . Tubular adenoma of colon   . Urinary tract infection, site not specified    Past Surgical History:  Procedure Laterality Date  . abdominal ultrasound  04/12/2004 & 6/06   positive gallstones  . ANKLE FRACTURE SURGERY  ~2002   pinning  . BTL  30+ years ago  . CATARACT EXTRACTION  2013   B eyes  . COLONOSCOPY  06/08/06   Polyps, divertics (Dr. Sharlett Iles)  . CT  abdomen  04/29/04   Positive gallstones  . INTRAMEDULLARY (IM) NAIL INTERTROCHANTERIC Left 11/14/2020   Procedure: INTRAMEDULLARY (IM) NAIL INTERTROCHANTRIC;  Surgeon: Earnestine Leys, MD;  Location: [facility] ORS;  Service: Orthopedics;  Laterality: Left;  . TONSILLECTOMY AND ADENOIDECTOMY  Age 25  . ultrasound pelvis  6/06   Negative    FAMILY HISTORY Family History  Problem Relation Age of Onset  . Depression Mother        Manic depression  . Diabetes Mother        Type 2  . Breast cancer Mother   . Heart disease Mother   . Colon polyps Father   . Heart attack Father 7  . Breast cancer Maternal Aunt   . Breast cancer Paternal Aunt   . Crohn's disease Brother   . Osteoporosis Brother   . Cancer Neg Hx   . Colon cancer Neg Hx     SOCIAL HISTORY Social History   Tobacco Use  . Smoking status: Former Smoker    Types: Cigarettes    Quit date: 03/19/2001    Years since quitting: 19.7  . Smokeless tobacco: Never Used  . Tobacco comment: Quit in 2001  Vaping Use  . Vaping Use: Never used  Substance Use Topics  . Alcohol use: Not Currently    Comment: 1 glass of wine per month  . Drug use: No    Comment: Uses CBD oil to help calm down         OPHTHALMIC EXAM: Base Eye Exam    Visual Acuity (ETDRS)      Right Left   Dist cc CF at 5' 20/25 -3   Dist ph cc NI 20/25 -2   Correction: Glasses       Tonometry (Tonopen, 10:57 AM)      Right Left   Pressure 13 12       Pupils      Pupils Dark Light Shape React APD   Right PERRL 4 3 Round Brisk None   Left PERRL 4 3 Round Brisk None       Visual Fields (Counting fingers)      Left Right    Full Full       Extraocular Movement      Right Left    Full Full       Neuro/Psych    Oriented x3: Yes   Mood/Affect: Normal       Dilation    Left eye: 1.0% Mydriacyl, 2.5% Phenylephrine @ 10:57 AM        Slit Lamp and Fundus Exam    External Exam      Right Left   External Normal Normal       Slit Lamp Exam  Right Left   Lids/Lashes Normal Normal   Conjunctiva/Sclera White and quiet White and quiet   Cornea Clear Clear   Anterior Chamber Deep and quiet Deep and quiet   Iris Round and reactive Round and reactive   Lens Posterior chamber intraocular lens Posterior chamber intraocular lens   Anterior Vitreous Normal Normal       Fundus Exam      Right Left   Posterior Vitreous  Normal   Disc  Normal   C/D Ratio  0.55   Macula  Disciform scar, Geographic atrophy near the FAZ, no exudates, Retinal pigment epithelial mottling   Vessels  Normal   Periphery  Normal          IMAGING AND PROCEDURES  Imaging and Procedures for 12/20/20  OCT, Retina - OU - Both Eyes       Right Eye Quality was good. Scan locations included subfoveal. Central Foveal Thickness: 212. Progression has been stable. Findings include central retinal atrophy, inner retinal atrophy, outer retinal atrophy, abnormal foveal contour, retinal drusen , no IRF, no SRF.   Left Eye Quality was good. Scan locations included subfoveal. Central Foveal Thickness: 268. Progression has been stable. Findings include abnormal foveal contour, no SRF, disciform scar, subretinal scarring, central retinal atrophy.   Notes OD, no active CNVM, no OS with much less subretinal fluid CME as compared to January 2021.  Currently at 9-week follow-up left eye       Intravitreal Injection, Pharmacologic Agent - OS - Left Eye       Time Out 12/20/2020. 11:33 AM. Confirmed correct patient, procedure, site, and patient consented.   Anesthesia Topical anesthesia was used. Anesthetic medications included Akten 3.5%.   Procedure Preparation included Ofloxacin , 10% betadine to eyelids, 5% betadine to ocular surface. A 30 gauge needle was used.   Injection:  2.5 mg Bevacizumab (AVASTIN) 2.67m/0.1mL SOSY   NDC:: 58527-782-42 Lot:: [id number]  Route: Intravitreal, Site: Left Eye  Post-op Post injection exam found visual acuity of at  least counting fingers. The patient tolerated the procedure well. There were no complications. The patient received written and verbal post procedure care education. Post injection medications were not given.                 ASSESSMENT/PLAN:  No problem-specific Assessment & Plan notes found for this encounter.      ICD-10-CM   1. Exudative age-related macular degeneration of left eye with active choroidal neovascularization (HCC)  H35.3221 OCT, Retina - OU - Both Eyes    Intravitreal Injection, Pharmacologic Agent - OS - Left Eye    bevacizumab (AVASTIN) SOSY 2.5 mg    1.  Accessible protection of CNVM progression into the fovea left eye with preservation of acuity.  At 9-week follow-up post Avastin.  We will repeat injection left eye today intravitreal Avastin examination extension to 10 weeks  2.  Disciform scar OD, will dilate OU next and evaluate  3.  Ophthalmic Meds Ordered this visit:  Meds ordered this encounter  Medications  . bevacizumab (AVASTIN) SOSY 2.5 mg       Return in about 10 weeks (around 02/28/2021) for DILATE OU, AVASTIN OCT, OS.  There are no Patient Instructions on file for this visit.   Explained the diagnoses, plan, and follow up with the patient and they expressed understanding.  Patient expressed understanding of the importance of proper follow up care.   GClent DemarkRankin M.D. Diseases & Surgery of the Retina and  Vitreous Retina & Diabetic [place] 12/20/20     Abbreviations: M myopia (nearsighted); A astigmatism; H hyperopia (farsighted); P presbyopia; Mrx spectacle prescription;  CTL contact lenses; OD right eye; OS left eye; OU both eyes  XT exotropia; ET esotropia; PEK punctate epithelial keratitis; PEE punctate epithelial erosions; DES dry eye syndrome; MGD meibomian gland dysfunction; ATs artificial tears; PFAT's preservative free artificial tears; [place] nuclear sclerotic cataract; PSC posterior subcapsular cataract; ERM epi-retinal  membrane; PVD posterior vitreous detachment; RD retinal detachment; DM diabetes mellitus; DR diabetic retinopathy; NPDR non-proliferative diabetic retinopathy; PDR proliferative diabetic retinopathy; CSME clinically significant macular edema; DME diabetic macular edema; dbh dot blot hemorrhages; CWS cotton wool spot; POAG primary open angle glaucoma; C/D cup-to-disc ratio; HVF humphrey visual field; GVF goldmann visual field; OCT optical coherence tomography; IOP intraocular pressure; BRVO Branch retinal vein occlusion; CRVO central retinal vein occlusion; CRAO central retinal artery occlusion; BRAO branch retinal artery occlusion; RT retinal tear; SB scleral buckle; PPV pars plana vitrectomy; VH Vitreous hemorrhage; PRP panretinal laser photocoagulation; IVK intravitreal kenalog; VMT vitreomacular traction; MH Macular hole;  NVD neovascularization of the disc; NVE neovascularization elsewhere; AREDS age related eye disease study; ARMD age related macular degeneration; POAG primary open angle glaucoma; EBMD epithelial/anterior basement membrane dystrophy; ACIOL anterior chamber intraocular lens; IOL intraocular lens; PCIOL posterior chamber intraocular lens; Phaco/IOL phacoemulsification with intraocular lens placement; [place] photorefractive keratectomy; LASIK laser assisted in situ keratomileusis; HTN hypertension; DM diabetes mellitus; COPD chronic obstructive pulmonary disease

## 2020-12-20 NOTE — Patient Instructions (Signed)
Patient instructed to contact the office promptly for new onset visual acuity decline or distortions

## 2020-12-21 ENCOUNTER — Telehealth: Payer: Self-pay

## 2020-12-21 DIAGNOSIS — D509 Iron deficiency anemia, unspecified: Secondary | ICD-10-CM | POA: Diagnosis not present

## 2020-12-21 DIAGNOSIS — Z7982 Long term (current) use of aspirin: Secondary | ICD-10-CM | POA: Diagnosis not present

## 2020-12-21 DIAGNOSIS — E785 Hyperlipidemia, unspecified: Secondary | ICD-10-CM | POA: Diagnosis not present

## 2020-12-21 DIAGNOSIS — I10 Essential (primary) hypertension: Secondary | ICD-10-CM | POA: Diagnosis not present

## 2020-12-21 DIAGNOSIS — M80052D Age-related osteoporosis with current pathological fracture, left femur, subsequent encounter for fracture with routine healing: Secondary | ICD-10-CM | POA: Diagnosis not present

## 2020-12-21 DIAGNOSIS — E569 Vitamin deficiency, unspecified: Secondary | ICD-10-CM | POA: Diagnosis not present

## 2020-12-21 NOTE — Telephone Encounter (Signed)
[place]ta [place] OT with [facility] left v/m requesting verbal orders HH OT 2 x a wk for 3 wks.

## 2020-12-22 NOTE — Telephone Encounter (Signed)
Please give the order.  Thanks.

## 2020-12-22 NOTE — Telephone Encounter (Signed)
Called a left VM for [place] with verbal orders for OT

## 2020-12-23 ENCOUNTER — Telehealth: Payer: Self-pay | Admitting: Family Medicine

## 2020-12-23 NOTE — Telephone Encounter (Signed)
Form has been filled out and placed in box to sign

## 2020-12-23 NOTE — Telephone Encounter (Signed)
Are you okay doing this? If so I will get it filled out for her

## 2020-12-23 NOTE — Telephone Encounter (Signed)
Pt called in wanted to know about getting a temporary handicap placard due to she broke her hip on 11/14/20 and she can be reached at [phone]

## 2020-12-23 NOTE — Telephone Encounter (Signed)
Dr. Damita Dunnings signed form; patient notified form is ready and placed up front for pickup. Copy also made to scan into chart.

## 2020-12-23 NOTE — Telephone Encounter (Signed)
Please, yes, thanks.

## 2020-12-24 NOTE — Telephone Encounter (Signed)
Thanks

## 2020-12-28 DIAGNOSIS — M80052D Age-related osteoporosis with current pathological fracture, left femur, subsequent encounter for fracture with routine healing: Secondary | ICD-10-CM | POA: Diagnosis not present

## 2020-12-28 DIAGNOSIS — D509 Iron deficiency anemia, unspecified: Secondary | ICD-10-CM | POA: Diagnosis not present

## 2020-12-28 DIAGNOSIS — I10 Essential (primary) hypertension: Secondary | ICD-10-CM | POA: Diagnosis not present

## 2020-12-28 DIAGNOSIS — E785 Hyperlipidemia, unspecified: Secondary | ICD-10-CM | POA: Diagnosis not present

## 2020-12-28 DIAGNOSIS — E569 Vitamin deficiency, unspecified: Secondary | ICD-10-CM | POA: Diagnosis not present

## 2020-12-28 DIAGNOSIS — Z7982 Long term (current) use of aspirin: Secondary | ICD-10-CM | POA: Diagnosis not present

## 2020-12-30 DIAGNOSIS — E569 Vitamin deficiency, unspecified: Secondary | ICD-10-CM | POA: Diagnosis not present

## 2020-12-30 DIAGNOSIS — E785 Hyperlipidemia, unspecified: Secondary | ICD-10-CM | POA: Diagnosis not present

## 2020-12-30 DIAGNOSIS — Z7982 Long term (current) use of aspirin: Secondary | ICD-10-CM | POA: Diagnosis not present

## 2020-12-30 DIAGNOSIS — I10 Essential (primary) hypertension: Secondary | ICD-10-CM | POA: Diagnosis not present

## 2020-12-30 DIAGNOSIS — D509 Iron deficiency anemia, unspecified: Secondary | ICD-10-CM | POA: Diagnosis not present

## 2020-12-30 DIAGNOSIS — M80052D Age-related osteoporosis with current pathological fracture, left femur, subsequent encounter for fracture with routine healing: Secondary | ICD-10-CM | POA: Diagnosis not present

## 2020-12-31 NOTE — Telephone Encounter (Signed)
[facility] called to inform PCP that patient has requested to discontinue OT. Sending to PCP as FYI.

## 2021-01-01 ENCOUNTER — Emergency Department
Admission: EM | Admit: 2021-01-01 | Discharge: 2021-01-01 | Disposition: A | Discharge status: Home / Self Care | Payer: Medicare HMO | Attending: Student in an Organized Health Care Education/Training Program | Admitting: Student in an Organized Health Care Education/Training Program

## 2021-01-01 ENCOUNTER — Other Ambulatory Visit: Payer: Self-pay

## 2021-01-01 ENCOUNTER — Emergency Department: Payer: Medicare HMO

## 2021-01-01 DIAGNOSIS — Z87891 Personal history of nicotine dependence: Secondary | ICD-10-CM | POA: Diagnosis not present

## 2021-01-01 DIAGNOSIS — M545 Low back pain, unspecified: Secondary | ICD-10-CM | POA: Diagnosis not present

## 2021-01-01 DIAGNOSIS — I2510 Atherosclerotic heart disease of native coronary artery without angina pectoris: Secondary | ICD-10-CM | POA: Insufficient documentation

## 2021-01-01 DIAGNOSIS — S3992XA Unspecified injury of lower back, initial encounter: Secondary | ICD-10-CM | POA: Diagnosis present

## 2021-01-01 DIAGNOSIS — X58XXXA Exposure to other specified factors, initial encounter: Secondary | ICD-10-CM | POA: Insufficient documentation

## 2021-01-01 DIAGNOSIS — I110 Hypertensive heart disease with heart failure: Secondary | ICD-10-CM | POA: Diagnosis not present

## 2021-01-01 DIAGNOSIS — N390 Urinary tract infection, site not specified: Secondary | ICD-10-CM | POA: Diagnosis not present

## 2021-01-01 DIAGNOSIS — S39012A Strain of muscle, fascia and tendon of lower back, initial encounter: Secondary | ICD-10-CM | POA: Insufficient documentation

## 2021-01-01 DIAGNOSIS — Z7982 Long term (current) use of aspirin: Secondary | ICD-10-CM | POA: Diagnosis not present

## 2021-01-01 DIAGNOSIS — I5032 Chronic diastolic (congestive) heart failure: Secondary | ICD-10-CM | POA: Diagnosis not present

## 2021-01-01 LAB — URINALYSIS, COMPLETE (UACMP) WITH MICROSCOPIC
Bilirubin Urine: NEGATIVE
Glucose, UA: NEGATIVE mg/dL
Hgb urine dipstick: NEGATIVE
Ketones, ur: NEGATIVE mg/dL
Nitrite: POSITIVE — AB
Protein, ur: NEGATIVE mg/dL
Specific Gravity, Urine: 1.015 (ref 1.005–1.030)
WBC, UA: >50 WBC/hpf — ABNORMAL HIGH (ref 0–5)
pH: 6 (ref 5.0–8.0)

## 2021-01-01 LAB — COMPREHENSIVE METABOLIC PANEL
ALT: 29 U/L (ref 0–44)
AST: 34 U/L (ref 15–41)
Albumin: 4.1 g/dL (ref 3.5–5.0)
Alkaline Phosphatase: 69 U/L (ref 38–126)
Anion gap: 9 (ref 5–15)
BUN: 14 mg/dL (ref 8–23)
CO2: 24 mmol/L (ref 22–32)
Calcium: 9.3 mg/dL (ref 8.9–10.3)
Chloride: 104 mmol/L (ref 98–111)
Creatinine, Ser: 0.52 mg/dL (ref 0.44–1.00)
GFR, Estimated: >60 mL/min (ref >60)
Glucose, Bld: 109 mg/dL — ABNORMAL HIGH (ref 70–99)
Potassium: 4.3 mmol/L (ref 3.5–5.1)
Sodium: 137 mmol/L (ref 135–145)
Total Bilirubin: 0.6 mg/dL (ref 0.3–1.2)
Total Protein: 7.5 g/dL (ref 6.5–8.1)

## 2021-01-01 LAB — CBC
HCT: 39.6 % (ref 36.0–46.0)
Hemoglobin: 12.9 g/dL (ref 12.0–15.0)
MCH: 31.2 pg (ref 26.0–34.0)
MCHC: 32.6 g/dL (ref 30.0–36.0)
MCV: 95.9 fL (ref 80.0–100.0)
Platelets: 200 10*3/uL (ref 150–400)
RBC: 4.13 MIL/uL (ref 3.87–5.11)
RDW: 13.1 % (ref 11.5–15.5)
WBC: 7.3 10*3/uL (ref 4.0–10.5)
nRBC: 0 % (ref 0.0–0.2)

## 2021-01-01 MED ORDER — CIPROFLOXACIN HCL 250 MG PO TABS: 250.0000 mg | ORAL_TABLET | Freq: Two times a day (BID) | ORAL | 0 refills | Status: AC

## 2021-01-01 MED ORDER — ORPHENADRINE CITRATE 30 MG/ML IJ SOLN
60.0000 mg | Freq: Two times a day (BID) | INTRAMUSCULAR | Status: DC
  Administered 2021-01-01: 60 mg via INTRAMUSCULAR
  Filled 2021-01-01: qty 2

## 2021-01-01 MED ORDER — OXYCODONE-ACETAMINOPHEN 5-325 MG PO TABS
1.0000 | ORAL_TABLET | Freq: Once | ORAL | Status: AC
  Administered 2021-01-01: 1 via ORAL
  Filled 2021-01-01: qty 1

## 2021-01-01 MED ORDER — BACLOFEN 10 MG PO TABS: 10.0000 mg | ORAL_TABLET | Freq: Two times a day (BID) | ORAL | 0 refills | Status: DC

## 2021-01-01 NOTE — ED Notes (Signed)
Patient transported to X-ray 

## 2021-01-01 NOTE — ED Triage Notes (Signed)
Pt has been in PT for hip fracture and did some new back exercises yesterday and then today woke up with back soreness. Pt also states that her urine smelled foul this morning after she urinated on herself. Pt also endorses some nausea.

## 2021-01-01 NOTE — Discharge Instructions (Addendum)
Follow up with your regular doctor as needed REturn to the ER if worsening Use the baclofen for muscle strain as needed Cipro is for the uti You may continue your vicodin as needed

## 2021-01-01 NOTE — ED Provider Notes (Signed)
[facility] Emergency Department Provider Note  ____________________________________________   Event Date/Time   First MD Initiated Contact with Patient 01/01/21 1700     (approximate)  I have reviewed the triage vital signs and the nursing notes.   HISTORY  Chief Complaint Back Pain    HPI Susan [name] is a 75 y.o. female presents emergency department complaining of low back pain along with foul-smelling urine. Patient has been doing physical therapy for her hip fracture. States that she is now having low back pain which is not helped by her Vicodin or Tylenol. States pain is worse with movement. States she had to urinate earlier today and noticed that the smell of the urine was horrible. She denies any fever or chills. No vomiting or diarrhea. Patient has been taking hydrocodone 2 pills at night along with Tylenol during the day    Past Medical History:  Diagnosis Date  . Colon cancer screening   . Diverticulosis of colon   . Hyperlipidemia   . Hypertension   . IBS (irritable bowel syndrome)   . 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 Macular degeneration   . Migraine, unspecified, without mention of intractable migraine without mention of status migrainosus   . NASH (nonalcoholic steatohepatitis)   . Nonobstructive atherosclerosis of coronary artery 2016   Cardiac CTA with mild proximal LAD disease; CAC score 74.  Marland Kitchen Ocular migraine   . Osteoporosis    on Fosamax for 5 years  . Other abnormal glucose   . Other chronic nonalcoholic liver disease    LFTs normalized with weight loss 2013  . Other screening mammogram   . Symptomatic menopausal or female climacteric states   . Thyroid disease   . Tubular adenoma of colon   . Urinary tract infection, site not specified     Patient Active Problem List   Diagnosis Date Noted  . Orthostatic hypotension   . Hypokalemia   . Hyponatremia   . Tricuspid valve insufficiency   . IBD (inflammatory bowel  disease)   . NASH (nonalcoholic steatohepatitis)   . Chronic Grade I diastolic CHF on echocardiogram 06/08/20 ([place]) 11/13/2020  . Closed comminuted intertrochanteric fracture of left femur ([place]) 11/13/2020  . Accidental fall 11/13/2020  . Preoperative clearance 11/13/2020  . Closed left hip fracture, initial encounter ([place]) 11/13/2020  . Valvular heart disease 06/09/2020  . Exudative age-related macular degeneration of right eye with active choroidal neovascularization ([place]) 03/23/2020  . Exudative age-related macular degeneration of left eye with active choroidal neovascularization ([place]) 02/10/2020  . Exudative age-related macular degeneration of right eye with inactive choroidal neovascularization ([place]) 02/10/2020  . Posterior vitreous detachment of right eye 02/10/2020  . Posterior vitreous detachment of left eye 02/10/2020  . Advanced nonexudative age-related macular degeneration of right eye with subfoveal involvement 02/10/2020  . Mitral valve insufficiency 12/12/2019  . Aortic valve regurgitation 12/12/2019  . CAD in native artery 09/05/2019  . Carotid artery plaque, bilateral 09/05/2019  . Right shoulder pain 08/20/2019  . Health care maintenance 02/03/2018  . Hand numbness 04/10/2017  . Numbness and tingling of foot 04/10/2017  . Carpal tunnel syndrome of right wrist 04/10/2017  . Neuropathy 02/27/2017  . HLD (hyperlipidemia) 07/23/2016  . Cystocele 05/19/2015  . Dupuytren contracture 03/24/2015  . Advance care planning 03/24/2015  . Benign paroxysmal positional vertigo 11/09/2014  . Anxiety state 11/09/2014  . Fatigue 08/27/2014  . Multiple joint pain 07/09/2014  . Medicare annual wellness visit, initial 08/25/2013  . Abdominal  pain, other specified site 08/25/2013  . Multiple thyroid nodules 08/06/2013  . Bilateral carotid artery stenosis 05/22/2013  . Memory changes 08/27/2012  . Lipoma 05/05/2011  . Disordered eating 05/05/2011  . B12 deficiency 11/18/2010  .  Irritable bowel syndrome 11/11/2010  . DIVERTICULOSIS, COLON 11/04/2010  . Non-alcoholic fatty liver disease 11/04/2010  . COLONIC POLYPS, HX OF 11/04/2010  . Hyperglycemia 08/12/2010  . MIGRAINE HEADACHE 12/27/2007  . Essential hypertension 12/27/2007  . MENOPAUSAL SYNDROME 12/27/2007  . Osteoporosis 11/05/2007  . LFT elevation 05/06/2006    Past Surgical History:  Procedure Laterality Date  . abdominal ultrasound  04/12/2004 & 6/06   positive gallstones  . ANKLE FRACTURE SURGERY  ~2002   pinning  . BTL  30+ years ago  . CATARACT EXTRACTION  2013   B eyes  . COLONOSCOPY  06/08/06   Polyps, divertics (Dr. Sharlett Iles)  . CT abdomen  04/29/04   Positive gallstones  . INTRAMEDULLARY (IM) NAIL INTERTROCHANTERIC Left 11/14/2020   Procedure: INTRAMEDULLARY (IM) NAIL INTERTROCHANTRIC;  Surgeon: Earnestine Leys, MD;  Location: [facility] ORS;  Service: Orthopedics;  Laterality: Left;  . TONSILLECTOMY AND ADENOIDECTOMY  Age 62  . ultrasound pelvis  6/06   Negative    Prior to Admission medications   Medication Sig Start Date End Date Taking? Authorizing Provider  baclofen (LIORESAL) 10 MG tablet Take 1 tablet (10 mg total) by mouth 2 (two) times daily for 7 days. 01/01/21 01/08/21 Yes Fisher, Linden Dolin, PA-C  ciprofloxacin (CIPRO) 250 MG tablet Take 1 tablet (250 mg total) by mouth 2 (two) times daily for 7 days. 01/01/21 01/08/21 Yes Fisher, Linden Dolin, PA-C  acetaminophen (TYLENOL) 500 MG tablet Take 1,000 mg by mouth 2 (two) times daily.    [provider]  aspirin 81 MG tablet Take 81 mg by mouth daily.    [provider]  Cholecalciferol 1000 units tablet Take 2 tablets (2,000 Units total) by mouth daily. 01/31/18   Tonia Ghent, MD  enoxaparin (LOVENOX) 30 MG/0.3ML injection Inject 0.3 mLs (30 mg total) into the skin daily for 14 days. 11/19/20 12/03/20  Sharen Hones, MD  ezetimibe (ZETIA) 10 MG tablet Take 1 tablet (10 mg total) by mouth daily. 12/20/20 03/20/21  End, Harrell Gave, MD   feeding supplement (ENSURE ENLIVE / ENSURE PLUS) LIQD Take 237 mLs by mouth 2 (two) times daily between meals. 11/18/20   Sharen Hones, MD  ferrous sulfate 325 (65 FE) MG tablet Take 1 tablet (325 mg total) by mouth daily with breakfast. 11/19/20   Sharen Hones, MD  HYDROcodone-acetaminophen (NORCO/VICODIN) 5-325 MG tablet Take 1-2 tablets by mouth every 6 (six) hours as needed for moderate pain. 11/18/20   Sharen Hones, MD  meclizine (ANTIVERT) 12.5 MG tablet Take 1 tablet (12.5 mg total) by mouth 3 (three) times daily as needed for dizziness. 08/04/16   Joanne Gavel, MD  multivitamin-lutein [facility]) CAPS capsule Take 1 capsule by mouth daily. Taking 1 tablet daily Patient not taking: Reported on 08/30/2020    [provider]  vitamin B-12 (CYANOCOBALAMIN) 1000 MCG tablet Take 1,000 mcg by mouth daily.    [provider]    Allergies Lipitor [atorvastatin calcium], Penicillins, and Sulfonamide derivatives  Family History  Problem Relation Age of Onset  . Depression Mother        Manic depression  . Diabetes Mother        Type 2  . Breast cancer Mother   . Heart disease Mother   .  Colon polyps Father   . Heart attack Father 78  . Breast cancer Maternal Aunt   . Breast cancer Paternal Aunt   . Crohn's disease Brother   . Osteoporosis Brother   . Cancer Neg Hx   . Colon cancer Neg Hx     Social History Social History   Tobacco Use  . Smoking status: Former Smoker    Types: Cigarettes    Quit date: 03/19/2001    Years since quitting: 19.8  . Smokeless tobacco: Never Used  . Tobacco comment: Quit in 2001  Vaping Use  . Vaping Use: Never used  Substance Use Topics  . Alcohol use: Not Currently    Comment: 1 glass of wine per month  . Drug use: No    Comment: Uses CBD oil to help calm down    Review of Systems  Constitutional: No fever/chills Eyes: No visual changes. ENT: No sore throat. Respiratory: Denies cough Cardiovascular: Denies chest  pain Gastrointestinal: Denies abdominal pain Genitourinary: Negative for dysuria. Musculoskeletal: Positive for back pain. Skin: Negative for rash. Psychiatric: no mood changes,     ____________________________________________   PHYSICAL EXAM:  VITAL SIGNS: ED Triage Vitals  Enc Vitals Group     BP 01/01/21 1421 (!) 153/75     Pulse Rate 01/01/21 1421 89     Resp 01/01/21 1421 18     Temp 01/01/21 1421 98.4 F (36.9 C)     Temp Source 01/01/21 1421 Oral     SpO2 01/01/21 1421 99 %     Weight 01/01/21 1422 147 lb (66.7 kg)     Height 01/01/21 1422 5' 1"  (1.549 m)     Head Circumference --      Peak Flow --      Pain Score 01/01/21 1422 7     Pain Loc --      Pain Edu? --      Excl. in [place]? --     Constitutional: Alert and oriented. Well appearing and in no acute distress. Eyes: Conjunctivae are normal.  Head: Atraumatic. Nose: No congestion/rhinnorhea. Mouth/Throat: Mucous membranes are moist.   Neck:  supple no lymphadenopathy noted Cardiovascular: Normal rate, regular rhythm.  Respiratory: Normal respiratory effort.  No retractions,  Abd: soft nontender bs normal all 4 quad GU: deferred Musculoskeletal: FROM all extremities, warm and well perfused, lumbar spine tender in paravertebral muscles Neurologic:  Normal speech and language.  Skin:  Skin is warm, dry and intact. No rash noted. Psychiatric: Mood and affect are normal. Speech and behavior are normal.  ____________________________________________   LABS (all labs ordered are listed, but only abnormal results are displayed)  Labs Reviewed  URINALYSIS, COMPLETE (UACMP) WITH MICROSCOPIC - Abnormal; Notable for the following components:      Result Value   Color, Urine YELLOW (*)    APPearance CLOUDY (*)    Nitrite POSITIVE (*)    Leukocytes,Ua LARGE (*)    WBC, UA >50 (*)    Bacteria, UA MANY (*)    All other components within normal limits  COMPREHENSIVE METABOLIC PANEL - Abnormal; Notable for the  following components:   Glucose, Bld 109 (*)    All other components within normal limits  URINE CULTURE  CBC   ____________________________________________   ____________________________________________  RADIOLOGY  Xray lumbar spine  ____________________________________________   PROCEDURES  Procedure(s) performed: No  Procedures    ____________________________________________   INITIAL IMPRESSION / ASSESSMENT AND PLAN / ED COURSE  Pertinent labs & imaging results  that were available during my care of the patient were reviewed by me and considered in my medical decision making (see chart for details).   Patient 75 year old female presents with low back pain and UTI symptoms. See HPI. Physical exam shows patient appears stable. Paravertebral muscles tender in the lumbar area. No CVA tenderness and no abdominal tenderness.  DX: Muscle strain, lumbar fracture, UTI, pyelonephritis  CBC, comprehensive metabolic panel normal, UA shows positive nitrates large amount of leuks, greater than 50 WBCs and many bacteria materia along with WBC clumps  X-ray of the lumbar spine  Xray of the lumbar spine shows degenerative changes, no fracture, reviewed by me confirmed by radiology  Pt states she had relief with muscle relaxer and pain medication  Pt was given a rx for cipro, baclofen, f/u with reg doctor if not improving in 3 days, urine culture added   Pt was discharged in stable condition   Susan [name] was evaluated in Emergency Department on 01/01/2021 for the symptoms described in the history of present illness. She was evaluated in the context of the global COVID-19 pandemic, which necessitated consideration that the patient might be at risk for infection with the SARS-CoV-2 virus that causes COVID-19. Institutional protocols and algorithms that pertain to the evaluation of patients at risk for COVID-19 are in a state of rapid change based on information released by  regulatory bodies including the CDC and federal and state organizations. These policies and algorithms were followed during the patient's care in the ED.    As part of my medical decision making, I reviewed the following data within the [place] notes reviewed and incorporated, Labs reviewed, Old chart reviewed, Radiograph reviewed , Notes from prior ED visits and [place] Controlled Substance Database  ____________________________________________   FINAL CLINICAL IMPRESSION(S) / ED DIAGNOSES  Final diagnoses:  Lumbar strain, initial encounter  Urinary tract infection without hematuria, site unspecified      NEW MEDICATIONS STARTED DURING THIS VISIT:  New Prescriptions   BACLOFEN (LIORESAL) 10 MG TABLET    Take 1 tablet (10 mg total) by mouth 2 (two) times daily for 7 days.   CIPROFLOXACIN (CIPRO) 250 MG TABLET    Take 1 tablet (250 mg total) by mouth 2 (two) times daily for 7 days.     Note:  This document was prepared using Dragon voice recognition software and may include unintentional dictation errors.    Versie Starks, PA-C 01/01/21 Burns [place], MD 01/01/21 2037

## 2021-01-02 NOTE — Telephone Encounter (Signed)
Okay with me.  I will defer to patient.

## 2021-01-03 ENCOUNTER — Telehealth: Payer: Self-pay

## 2021-01-03 DIAGNOSIS — E785 Hyperlipidemia, unspecified: Secondary | ICD-10-CM | POA: Diagnosis not present

## 2021-01-03 DIAGNOSIS — D509 Iron deficiency anemia, unspecified: Secondary | ICD-10-CM | POA: Diagnosis not present

## 2021-01-03 DIAGNOSIS — Z7982 Long term (current) use of aspirin: Secondary | ICD-10-CM | POA: Diagnosis not present

## 2021-01-03 DIAGNOSIS — E569 Vitamin deficiency, unspecified: Secondary | ICD-10-CM | POA: Diagnosis not present

## 2021-01-03 DIAGNOSIS — M80052D Age-related osteoporosis with current pathological fracture, left femur, subsequent encounter for fracture with routine healing: Secondary | ICD-10-CM | POA: Diagnosis not present

## 2021-01-03 DIAGNOSIS — I10 Essential (primary) hypertension: Secondary | ICD-10-CM | POA: Diagnosis not present

## 2021-01-03 NOTE — Telephone Encounter (Signed)
Called and spoke with patient who stated that her symptoms of lower back pain began Friday. Saturday, she went to the ED (refer to note) where they diagnosed her with a UTI. Patient was prescribed Cipro and Baclofen. Patient stated that she started taking these medications yesterday morning. Patient continues to have urinary frequency and incontinence, as well as worsening back pain. Patient stated that the medication does not seem to be helping. Instructed Susan [name] that it would take some time for the antibiotics to become effective since she has only been taking them for a day. Encouraged patient to drink plenty of water and continue taking medication as prescribed. Instructed patient if she develops new or worsening symptoms to call back to office. UC and ED precautions given. Patient verbalized understanding.

## 2021-01-03 NOTE — Telephone Encounter (Signed)
[facility] Night - Client TELEPHONE ADVICE RECORD [facility] Patient Name: Susan [name] Gender: Female DOB: 1946-04-13 Age: 75 Y 52 M 3 D Return Phone Number: [phone] (Primary), [phone] (Secondary) Address: City/State/Zip: [place] [address] Client [facility] Night - Client Client Site [place] - Night Contact Type Call Who Is Calling Patient / Member / Family / Caregiver Call Type Triage / Clinical Relationship To Patient Self Return Phone Number [phone] (Primary) Chief Complaint Walking difficulty Reason for Call Symptomatic / Request for [place] states [place], pt of Susan [name] thinks she has uti, odourous urine this morning as well as diffilcuty walking from pain post new PT exercises for hip surgery Translation No Nurse Assessment Nurse: Susy Manor, RN, Megan Date/Time (Eastern Time): 01/01/2021 1:04:39 PM Confirm and document reason for call. If symptomatic, describe symptoms. ---Caller thinks she might have a UTI. Symptoms started yesterday. She had physical therapy Thursday and she thought the symptoms were from that. Urine has an odor. Does the patient have any new or worsening symptoms? ---Yes Will a triage be completed? ---Yes Related visit to physician within the last 2 weeks? ---No Does the PT have any chronic conditions? (i.e. diabetes, asthma, this includes High risk factors for pregnancy, etc.) ---No Is this a behavioral health or substance abuse call? ---No Guidelines Guideline Title Affirmed Question Affirmed Notes Nurse Date/Time (Eastern Time) Back Pain [1] SEVERE back pain (e.g., excruciating) AND [2] sudden onset AND [3] age > 66 years Susy Manor, RN, Jinny Blossom 01/01/2021 1:08:15 PM Disp. Time Eilene Ghazi Time) Disposition Final User 01/01/2021 1:10:25 PM Go to ED Now Yes Susy Manor, RN, Delphia Grates Disagree/Comply Comply Caller  Understands Yes PLEASE NOTE: All timestamps contained within this report are represented as [place] Standard Time. CONFIDENTIALTY NOTICE: This fax transmission is intended only for the addressee. It contains information that is legally privileged, confidential or otherwise protected from use or disclosure. If you are not the intended recipient, you are strictly prohibited from reviewing, disclosing, copying using or disseminating any of this information or taking any action in reliance on or regarding this information. If you have received this fax in error, please notify us immediately by telephone so that we can arrange for its return to [place]. Phone: [phone], Toll-Free: [phone], Fax: [phone] Page: 2 of 2 Call Id: [id number] PreDisposition Did not know what to do Care Advice Given Per Guideline GO TO ED NOW: * You need to be seen in the Emergency Department. * Go to the ED at ___________ [place]: * It is better and safer if another adult drives instead of you. BRING MEDICINES: * Bring a list of your current medicines when you go to the Emergency Department (ER). CARE ADVICE given per Back Pain (Adult) guideline. Comments User: Sula Soda, RN Date/Time Eilene Ghazi Time): 01/01/2021 1:11:01 PM Caller states she is having back pain. Pain level 8/10. Denies urinary pain. She also had incontinence this morning. Referrals GO TO FACILITY UNDECIDED

## 2021-01-03 NOTE — Telephone Encounter (Signed)
I would keep taking the antibiotics.  She has E coli on Ucx.  We may need to change the abx based on the sensitivity report but that is still pending.  If not better soon, then update [place].  Thanks.

## 2021-01-03 NOTE — Telephone Encounter (Signed)
Spoke with patient and she is feeling a little better this afternoon. States she has been asleep most of the day. Advised to call back if no better in a couple of days; also advised to continue taking the antibiotics. Patient agrees.

## 2021-01-04 LAB — URINE CULTURE: Culture: >=100000 — AB

## 2021-01-05 ENCOUNTER — Telehealth: Payer: Self-pay | Admitting: *Deleted

## 2021-01-05 NOTE — Telephone Encounter (Signed)
Please give the order.  Thanks.

## 2021-01-05 NOTE — Telephone Encounter (Signed)
Tried to call Crystal but no answer and VM is full

## 2021-01-05 NOTE — Telephone Encounter (Signed)
Crystal with [facility] called requesting that a nursing evaluation be added to the OT and PT orders for the patient. Crystal stated that patient has recently been treated for a UTI and has called them several times about her medications. Crystal stated that they will be able to help patient with her medications and how to take them.

## 2021-01-05 NOTE — Telephone Encounter (Signed)
Notify patient.  Good news.  Urine culture is positive with bacteria sensitive to Cipro.  The antibiotic should take care of this.  Follow-up as needed.  Thanks.

## 2021-01-05 NOTE — Telephone Encounter (Signed)
Notified patient about results and that the cipro should take care of her infection. Patient states her back has been hurting her badly today; took a pain pill before I called her. Advised patient to call back if no better after finishing abx course.

## 2021-01-06 ENCOUNTER — Telehealth: Payer: Self-pay | Admitting: Family Medicine

## 2021-01-06 NOTE — Telephone Encounter (Signed)
Called and spoke with patient and discussed her medications with her and answered questions she had. Patient verbalized understanding of discussion.

## 2021-01-06 NOTE — Telephone Encounter (Signed)
Patient would like to ask a question about taking certain medications together. Would you please call her back? Thank you , EM

## 2021-01-06 NOTE — Telephone Encounter (Signed)
Tried to call Crystal but no answer and VM is full

## 2021-01-07 ENCOUNTER — Telehealth: Payer: Self-pay | Admitting: Family Medicine

## 2021-01-07 DIAGNOSIS — E569 Vitamin deficiency, unspecified: Secondary | ICD-10-CM | POA: Diagnosis not present

## 2021-01-07 DIAGNOSIS — Z7982 Long term (current) use of aspirin: Secondary | ICD-10-CM | POA: Diagnosis not present

## 2021-01-07 DIAGNOSIS — E785 Hyperlipidemia, unspecified: Secondary | ICD-10-CM | POA: Diagnosis not present

## 2021-01-07 DIAGNOSIS — I10 Essential (primary) hypertension: Secondary | ICD-10-CM | POA: Diagnosis not present

## 2021-01-07 DIAGNOSIS — M80052D Age-related osteoporosis with current pathological fracture, left femur, subsequent encounter for fracture with routine healing: Secondary | ICD-10-CM | POA: Diagnosis not present

## 2021-01-07 DIAGNOSIS — D509 Iron deficiency anemia, unspecified: Secondary | ICD-10-CM | POA: Diagnosis not present

## 2021-01-07 NOTE — Telephone Encounter (Signed)
Pt called in wanted to know if she can get a refill on the baclofen she got it from the hospital and she is needing another fill wanted to know if Dr. Damita Dunnings can give it

## 2021-01-08 MED ORDER — BACLOFEN 10 MG PO TABS: 10.0000 mg | ORAL_TABLET | Freq: Two times a day (BID) | ORAL | 1 refills | Status: DC | PRN

## 2021-01-08 NOTE — Telephone Encounter (Signed)
Sent. Thanks.   

## 2021-01-10 NOTE — Telephone Encounter (Signed)
My chart sent to patient to let know.

## 2021-01-11 DIAGNOSIS — M6281 Muscle weakness (generalized): Secondary | ICD-10-CM | POA: Diagnosis not present

## 2021-01-11 DIAGNOSIS — S72002A Fracture of unspecified part of neck of left femur, initial encounter for closed fracture: Secondary | ICD-10-CM | POA: Diagnosis not present

## 2021-01-11 NOTE — Telephone Encounter (Signed)
Tried to call Crystal but no answer and VM is full

## 2021-01-12 DIAGNOSIS — E785 Hyperlipidemia, unspecified: Secondary | ICD-10-CM | POA: Diagnosis not present

## 2021-01-12 DIAGNOSIS — I10 Essential (primary) hypertension: Secondary | ICD-10-CM | POA: Diagnosis not present

## 2021-01-12 DIAGNOSIS — E569 Vitamin deficiency, unspecified: Secondary | ICD-10-CM | POA: Diagnosis not present

## 2021-01-12 DIAGNOSIS — Z7982 Long term (current) use of aspirin: Secondary | ICD-10-CM | POA: Diagnosis not present

## 2021-01-12 DIAGNOSIS — D509 Iron deficiency anemia, unspecified: Secondary | ICD-10-CM | POA: Diagnosis not present

## 2021-01-12 DIAGNOSIS — M80052D Age-related osteoporosis with current pathological fracture, left femur, subsequent encounter for fracture with routine healing: Secondary | ICD-10-CM | POA: Diagnosis not present

## 2021-01-13 ENCOUNTER — Telehealth: Payer: Self-pay

## 2021-01-13 DIAGNOSIS — D509 Iron deficiency anemia, unspecified: Secondary | ICD-10-CM | POA: Diagnosis not present

## 2021-01-13 DIAGNOSIS — I10 Essential (primary) hypertension: Secondary | ICD-10-CM | POA: Diagnosis not present

## 2021-01-13 DIAGNOSIS — Z7982 Long term (current) use of aspirin: Secondary | ICD-10-CM | POA: Diagnosis not present

## 2021-01-13 DIAGNOSIS — E569 Vitamin deficiency, unspecified: Secondary | ICD-10-CM | POA: Diagnosis not present

## 2021-01-13 DIAGNOSIS — M80052D Age-related osteoporosis with current pathological fracture, left femur, subsequent encounter for fracture with routine healing: Secondary | ICD-10-CM | POA: Diagnosis not present

## 2021-01-13 DIAGNOSIS — E785 Hyperlipidemia, unspecified: Secondary | ICD-10-CM | POA: Diagnosis not present

## 2021-01-13 NOTE — Telephone Encounter (Signed)
Agree we normally want to avoid sedating medications. Does she even have hydrocodone? #10 was prescribed by outside provider 11/2020. Wouldn't expect her to have an ongoing need for this.

## 2021-01-13 NOTE — Telephone Encounter (Signed)
Bonnie therapist with [facility] wants to clarify if pt can take baclofen and hydrocodone at the same time. Horris Latino said usually the providers do not recommend to take baclofen and hydrocodone together. Could cause drowsiness or respiratory depression. Horris Latino said would need order in writing if pt can take baclofen and hydrocodone together or would appreciate written order with specific instructions on how pt can take the baclofen and hydrocodone. Bonnie request cb. Dr Damita Dunnings and DR G is out of office today. Sending note to Dr Darnell Level who will be in office on 01/14/21.

## 2021-01-14 NOTE — Telephone Encounter (Signed)
Spoke with Horris Latino from [employer] about medications. Patient does have hydrocodone and has been taking that and the baclofen together. Nurse told Horris Latino that patient is very groggy and out of it when she takes these together. I advised Horris Latino on message from Dr. Vincent Gros. [facility] will need a letter from our office stating that patient should not take these two medications together to put in her chart. Letter needs to be faxed to Raechel Ache at [phone].

## 2021-01-15 DIAGNOSIS — I10 Essential (primary) hypertension: Secondary | ICD-10-CM | POA: Diagnosis not present

## 2021-01-15 DIAGNOSIS — E569 Vitamin deficiency, unspecified: Secondary | ICD-10-CM | POA: Diagnosis not present

## 2021-01-15 DIAGNOSIS — D509 Iron deficiency anemia, unspecified: Secondary | ICD-10-CM | POA: Diagnosis not present

## 2021-01-15 DIAGNOSIS — M80052D Age-related osteoporosis with current pathological fracture, left femur, subsequent encounter for fracture with routine healing: Secondary | ICD-10-CM | POA: Diagnosis not present

## 2021-01-15 DIAGNOSIS — Z7982 Long term (current) use of aspirin: Secondary | ICD-10-CM | POA: Diagnosis not present

## 2021-01-15 DIAGNOSIS — E785 Hyperlipidemia, unspecified: Secondary | ICD-10-CM | POA: Diagnosis not present

## 2021-01-16 NOTE — Addendum Note (Signed)
Addended by: Tonia Ghent on: 01/16/2021 09:02 PM   Modules accepted: Orders

## 2021-01-16 NOTE — Telephone Encounter (Addendum)
Please send letter for her to take baclofen twice a day as needed for muscle spasms with sedation caution.  Stop hydrocodone.  Thanks.

## 2021-01-17 NOTE — Telephone Encounter (Signed)
Letter has been done and sent to [facility]. Also called Susan [name] to inform of this.

## 2021-01-19 DIAGNOSIS — E569 Vitamin deficiency, unspecified: Secondary | ICD-10-CM | POA: Diagnosis not present

## 2021-01-19 DIAGNOSIS — I10 Essential (primary) hypertension: Secondary | ICD-10-CM | POA: Diagnosis not present

## 2021-01-19 DIAGNOSIS — M80052D Age-related osteoporosis with current pathological fracture, left femur, subsequent encounter for fracture with routine healing: Secondary | ICD-10-CM | POA: Diagnosis not present

## 2021-01-19 DIAGNOSIS — E785 Hyperlipidemia, unspecified: Secondary | ICD-10-CM | POA: Diagnosis not present

## 2021-01-19 DIAGNOSIS — D509 Iron deficiency anemia, unspecified: Secondary | ICD-10-CM | POA: Diagnosis not present

## 2021-01-19 DIAGNOSIS — Z7982 Long term (current) use of aspirin: Secondary | ICD-10-CM | POA: Diagnosis not present

## 2021-01-24 DIAGNOSIS — M80052D Age-related osteoporosis with current pathological fracture, left femur, subsequent encounter for fracture with routine healing: Secondary | ICD-10-CM | POA: Diagnosis not present

## 2021-01-24 DIAGNOSIS — D509 Iron deficiency anemia, unspecified: Secondary | ICD-10-CM | POA: Diagnosis not present

## 2021-01-24 DIAGNOSIS — E569 Vitamin deficiency, unspecified: Secondary | ICD-10-CM | POA: Diagnosis not present

## 2021-01-24 DIAGNOSIS — E785 Hyperlipidemia, unspecified: Secondary | ICD-10-CM | POA: Diagnosis not present

## 2021-01-24 DIAGNOSIS — I10 Essential (primary) hypertension: Secondary | ICD-10-CM | POA: Diagnosis not present

## 2021-01-24 DIAGNOSIS — Z7982 Long term (current) use of aspirin: Secondary | ICD-10-CM | POA: Diagnosis not present

## 2021-01-29 DIAGNOSIS — D509 Iron deficiency anemia, unspecified: Secondary | ICD-10-CM | POA: Diagnosis not present

## 2021-01-29 DIAGNOSIS — Z7982 Long term (current) use of aspirin: Secondary | ICD-10-CM | POA: Diagnosis not present

## 2021-01-29 DIAGNOSIS — M80052D Age-related osteoporosis with current pathological fracture, left femur, subsequent encounter for fracture with routine healing: Secondary | ICD-10-CM | POA: Diagnosis not present

## 2021-01-29 DIAGNOSIS — E785 Hyperlipidemia, unspecified: Secondary | ICD-10-CM | POA: Diagnosis not present

## 2021-01-29 DIAGNOSIS — I10 Essential (primary) hypertension: Secondary | ICD-10-CM | POA: Diagnosis not present

## 2021-01-29 DIAGNOSIS — E569 Vitamin deficiency, unspecified: Secondary | ICD-10-CM | POA: Diagnosis not present

## 2021-01-31 DIAGNOSIS — S72142D Displaced intertrochanteric fracture of left femur, subsequent encounter for closed fracture with routine healing: Secondary | ICD-10-CM | POA: Diagnosis not present

## 2021-02-09 DIAGNOSIS — S72142D Displaced intertrochanteric fracture of left femur, subsequent encounter for closed fracture with routine healing: Secondary | ICD-10-CM | POA: Diagnosis not present

## 2021-02-11 DIAGNOSIS — S72002A Fracture of unspecified part of neck of left femur, initial encounter for closed fracture: Secondary | ICD-10-CM | POA: Diagnosis not present

## 2021-02-11 DIAGNOSIS — M6281 Muscle weakness (generalized): Secondary | ICD-10-CM | POA: Diagnosis not present

## 2021-02-14 DIAGNOSIS — S72142D Displaced intertrochanteric fracture of left femur, subsequent encounter for closed fracture with routine healing: Secondary | ICD-10-CM | POA: Diagnosis not present

## 2021-02-16 DIAGNOSIS — S72142D Displaced intertrochanteric fracture of left femur, subsequent encounter for closed fracture with routine healing: Secondary | ICD-10-CM | POA: Diagnosis not present

## 2021-02-23 DIAGNOSIS — S72142D Displaced intertrochanteric fracture of left femur, subsequent encounter for closed fracture with routine healing: Secondary | ICD-10-CM | POA: Diagnosis not present

## 2021-02-28 DIAGNOSIS — S72142D Displaced intertrochanteric fracture of left femur, subsequent encounter for closed fracture with routine healing: Secondary | ICD-10-CM | POA: Diagnosis not present

## 2021-03-02 DIAGNOSIS — S72142D Displaced intertrochanteric fracture of left femur, subsequent encounter for closed fracture with routine healing: Secondary | ICD-10-CM | POA: Diagnosis not present

## 2021-03-07 DIAGNOSIS — S72142D Displaced intertrochanteric fracture of left femur, subsequent encounter for closed fracture with routine healing: Secondary | ICD-10-CM | POA: Diagnosis not present

## 2021-03-08 ENCOUNTER — Encounter (INDEPENDENT_AMBULATORY_CARE_PROVIDER_SITE_OTHER): Payer: Medicare HMO | Admitting: Ophthalmology

## 2021-03-09 ENCOUNTER — Encounter (INDEPENDENT_AMBULATORY_CARE_PROVIDER_SITE_OTHER): Payer: Medicare HMO | Admitting: Ophthalmology

## 2021-03-09 DIAGNOSIS — S72142D Displaced intertrochanteric fracture of left femur, subsequent encounter for closed fracture with routine healing: Secondary | ICD-10-CM | POA: Diagnosis not present

## 2021-03-10 ENCOUNTER — Ambulatory Visit (INDEPENDENT_AMBULATORY_CARE_PROVIDER_SITE_OTHER): Payer: Medicare HMO | Admitting: Ophthalmology

## 2021-03-10 ENCOUNTER — Other Ambulatory Visit: Payer: Self-pay

## 2021-03-10 ENCOUNTER — Encounter (INDEPENDENT_AMBULATORY_CARE_PROVIDER_SITE_OTHER): Payer: Self-pay | Admitting: Ophthalmology

## 2021-03-10 DIAGNOSIS — H353123 Nonexudative age-related macular degeneration, left eye, advanced atrophic without subfoveal involvement: Secondary | ICD-10-CM | POA: Diagnosis not present

## 2021-03-10 DIAGNOSIS — H353221 Exudative age-related macular degeneration, left eye, with active choroidal neovascularization: Secondary | ICD-10-CM

## 2021-03-10 DIAGNOSIS — H353212 Exudative age-related macular degeneration, right eye, with inactive choroidal neovascularization: Secondary | ICD-10-CM | POA: Diagnosis not present

## 2021-03-10 DIAGNOSIS — H353114 Nonexudative age-related macular degeneration, right eye, advanced atrophic with subfoveal involvement: Secondary | ICD-10-CM | POA: Diagnosis not present

## 2021-03-10 MED ORDER — BEVACIZUMAB 2.5 MG/0.1ML IZ SOSY
2.5000 mg | PREFILLED_SYRINGE | INTRAVITREAL | Status: AC | PRN
Start: 2021-03-10 — End: 2021-03-10
  Administered 2021-03-10: 2.5 mg via INTRAVITREAL

## 2021-03-10 NOTE — Assessment & Plan Note (Signed)
No signs of recurrence

## 2021-03-10 NOTE — Assessment & Plan Note (Signed)
I explained to the patient the proximity of geographic atrophy near the [place]

## 2021-03-10 NOTE — Assessment & Plan Note (Signed)
Repeat injection OS today intravitreal Avastin and follow-up next and then 3 months to monitor and maintain this monocular patient

## 2021-03-10 NOTE — Progress Notes (Signed)
03/10/2021     CHIEF COMPLAINT Patient presents for Retina Follow Up (10 wk fu OU/ Possible Avastin OS/Pt states, " I feel like my va may be a little better. I know that my black spots are almost gone. ")   HISTORY OF PRESENT ILLNESS: Susan [name] is a 75 y.o. female who presents to the clinic today for:   HPI    Retina Follow Up    Diagnosis: Wet AMD   Laterality: left eye   Onset: 10 weeks ago   Duration: 10 weeks   Course: gradually improving   Comments: 10 wk fu OU/ Possible Avastin OS Pt states, " I feel like my va may be a little better. I know that my black spots are almost gone. "       Last edited by Kendra Opitz, COA on 03/10/2021  3:46 PM. (History)      Referring physician: Tonia Ghent, MD [place],  [place] [id number]  HISTORICAL INFORMATION:   Selected notes from the MEDICAL RECORD NUMBER    Lab Results  Component Value Date   HGBA1C 5.5 11/14/2020     CURRENT MEDICATIONS: No current outpatient medications on file. (Ophthalmic Drugs)   No current facility-administered medications for this visit. (Ophthalmic Drugs)   Current Outpatient Medications (Other)  Medication Sig  . acetaminophen (TYLENOL) 500 MG tablet Take 1,000 mg by mouth 2 (two) times daily.  Marland Kitchen aspirin 81 MG tablet Take 81 mg by mouth daily.  . baclofen (LIORESAL) 10 MG tablet Take 1 tablet (10 mg total) by mouth 2 (two) times daily as needed for muscle spasms.  . Cholecalciferol 1000 units tablet Take 2 tablets (2,000 Units total) by mouth daily.  Marland Kitchen enoxaparin (LOVENOX) 30 MG/0.3ML injection Inject 0.3 mLs (30 mg total) into the skin daily for 14 days.  Marland Kitchen ezetimibe (ZETIA) 10 MG tablet Take 1 tablet (10 mg total) by mouth daily.  . feeding supplement (ENSURE ENLIVE / ENSURE PLUS) LIQD Take 237 mLs by mouth 2 (two) times daily between meals.  . ferrous sulfate 325 (65 FE) MG tablet Take 1 tablet (325 mg total) by mouth daily with breakfast.  . meclizine  (ANTIVERT) 12.5 MG tablet Take 1 tablet (12.5 mg total) by mouth 3 (three) times daily as needed for dizziness.  . multivitamin-lutein (OCUVITE-LUTEIN) CAPS capsule Take 1 capsule by mouth daily. Taking 1 tablet daily (Patient not taking: Reported on 08/30/2020)  . vitamin B-12 (CYANOCOBALAMIN) 1000 MCG tablet Take 1,000 mcg by mouth daily.   No current facility-administered medications for this visit. (Other)      REVIEW OF SYSTEMS:    ALLERGIES Allergies  Allergen Reactions  . Lipitor [Atorvastatin Calcium] Other (See Comments)    Aches on med at 97m a day.   .Marland KitchenPenicillins Swelling and Rash    Rash and "swelling" in high school. Tolerated cefazolin 11/14/2020   . Sulfonamide Derivatives Rash    PAST MEDICAL HISTORY Past Medical History:  Diagnosis Date  . Colon cancer screening   . Diverticulosis of colon   . Hyperlipidemia   . Hypertension   . IBS (irritable bowel syndrome)   . 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 Macular degeneration   . Migraine, unspecified, without mention of intractable migraine without mention of status migrainosus   . NASH (nonalcoholic steatohepatitis)   . Nonobstructive atherosclerosis of coronary artery 2016   Cardiac CTA with mild proximal LAD disease; CAC score 74.  .Marland KitchenOcular  migraine   . Osteoporosis    on Fosamax for 5 years  . Other abnormal glucose   . Other chronic nonalcoholic liver disease    LFTs normalized with weight loss 2013  . Other screening mammogram   . Symptomatic menopausal or female climacteric states   . Thyroid disease   . Tubular adenoma of colon   . Urinary tract infection, site not specified    Past Surgical History:  Procedure Laterality Date  . abdominal ultrasound  04/12/2004 & 6/06   positive gallstones  . ANKLE FRACTURE SURGERY  ~2002   pinning  . BTL  30+ years ago  . CATARACT EXTRACTION  2013   B eyes  . COLONOSCOPY  06/08/06   Polyps, divertics (Dr. Sharlett Iles)  . CT abdomen  04/29/04   Positive  gallstones  . INTRAMEDULLARY (IM) NAIL INTERTROCHANTERIC Left 11/14/2020   Procedure: INTRAMEDULLARY (IM) NAIL INTERTROCHANTRIC;  Surgeon: Earnestine Leys, MD;  Location: [facility] ORS;  Service: Orthopedics;  Laterality: Left;  . TONSILLECTOMY AND ADENOIDECTOMY  Age 72  . ultrasound pelvis  6/06   Negative    FAMILY HISTORY Family History  Problem Relation Age of Onset  . Depression Mother        Manic depression  . Diabetes Mother        Type 2  . Breast cancer Mother   . Heart disease Mother   . Colon polyps Father   . Heart attack Father 57  . Breast cancer Maternal Aunt   . Breast cancer Paternal Aunt   . Crohn's disease Brother   . Osteoporosis Brother   . Cancer Neg Hx   . Colon cancer Neg Hx     SOCIAL HISTORY Social History   Tobacco Use  . Smoking status: Former Smoker    Types: Cigarettes    Quit date: 03/19/2001    Years since quitting: 19.9  . Smokeless tobacco: Never Used  . Tobacco comment: Quit in 2001  Vaping Use  . Vaping Use: Never used  Substance Use Topics  . Alcohol use: Not Currently    Comment: 1 glass of wine per month  . Drug use: No    Comment: Uses CBD oil to help calm down         OPHTHALMIC EXAM: Base Eye Exam    Visual Acuity (Snellen - Linear)      Right Left   Dist cc CF at 5' 20/25 -2   Dist ph cc NI    Correction: Glasses       Tonometry (Tonopen, 3:51 PM)      Right Left   Pressure 12 15       Pupils      Pupils Dark Light Shape React APD   Right PERRL 4 3 Round Brisk None   Left PERRL 4 3 Round Brisk None       Visual Fields (Counting fingers)      Left Right    Full Full       Extraocular Movement      Right Left    Full Full       Neuro/Psych    Oriented x3: Yes   Mood/Affect: Normal       Dilation    Left eye: 1.0% Mydriacyl, 2.5% Phenylephrine @ 3:51 PM        Slit Lamp and Fundus Exam    External Exam      Right Left   External Normal Normal  Slit Lamp Exam      Right Left    Lids/Lashes Normal Normal   Conjunctiva/Sclera White and quiet White and quiet   Cornea Clear Clear   Anterior Chamber Deep and quiet Deep and quiet   Iris Round and reactive Round and reactive   Lens Posterior chamber intraocular lens Posterior chamber intraocular lens   Anterior Vitreous Normal Normal       Fundus Exam      Right Left   Posterior Vitreous Posterior vitreous detachment Posterior vitreous detachment   Disc Normal Normal   C/D Ratio 0.55 0.55   Macula Geographic atrophy centrally, 6-7 DA size Disciform scar, Geographic atrophy near the FAZ, no exudates, Retinal pigment epithelial mottling   Vessels Normal Normal   Periphery Normal Normal          IMAGING AND PROCEDURES  Imaging and Procedures for 03/10/21  OCT, Retina - OU - Both Eyes       Right Eye Quality was good. Scan locations included subfoveal. Central Foveal Thickness: 210. Progression has been stable. Findings include central retinal atrophy, inner retinal atrophy, outer retinal atrophy, abnormal foveal contour, retinal drusen , no IRF, no SRF.   Left Eye Quality was good. Scan locations included subfoveal. Central Foveal Thickness: 249. Progression has been stable. Findings include abnormal foveal contour, no SRF, disciform scar, subretinal scarring, central retinal atrophy.   Notes OD, no active CNVM, no OS with much less subretinal fluid CME as compared to January 2021.  Currently at 12-week follow-up left eye  Repeat injection OS today intravitreal Avastin and follow-up next and then 3 months to monitor and maintain this monocular patient       Intravitreal Injection, Pharmacologic Agent - OS - Left Eye       Time Out 03/10/2021. 4:40 PM. Confirmed correct patient, procedure, site, and patient consented.   Anesthesia Topical anesthesia was used. Anesthetic medications included Akten 3.5%.   Procedure Preparation included Ofloxacin , 10% betadine to eyelids, 5% betadine to ocular  surface. A 30 gauge needle was used.   Injection:  2.5 mg Bevacizumab (AVASTIN) 2.42m/0.1mL SOSY   NDC:: 23557-322-02 Lot:: [id number]  Route: Intravitreal, Site: Left Eye  Post-op Post injection exam found visual acuity of at least counting fingers. The patient tolerated the procedure well. There were no complications. The patient received written and verbal post procedure care education. Post injection medications were not given.                 ASSESSMENT/PLAN:  Exudative age-related macular degeneration of left eye with active choroidal neovascularization (HCC) Repeat injection OS today intravitreal Avastin and follow-up next and then 3 months to monitor and maintain this monocular patient  Advanced nonexudative age-related macular degeneration of right eye with subfoveal involvement No change, no signs of CNVM  Exudative age-related macular degeneration of right eye with inactive choroidal neovascularization (HCC) No signs of recurrence  Advanced nonexudative age-related macular degeneration of left eye without subfoveal involvement I explained to the patient the proximity of geographic atrophy near the [place]  1. Exudative age-related macular degeneration of left eye with active choroidal neovascularization (HCC)  H35.3221 OCT, Retina - OU - Both Eyes    Intravitreal Injection, Pharmacologic Agent - OS - Left Eye    bevacizumab (AVASTIN) SOSY 2.5 mg  2. Advanced nonexudative age-related macular degeneration of right eye with subfoveal involvement  H35.3114   3. Exudative age-related macular degeneration of  right eye with inactive choroidal neovascularization ([place])  H35.3212   4. Advanced nonexudative age-related macular degeneration of left eye without subfoveal involvement  H35.3123     1.  OD, no change overall  2.  OS, much improved anatomy since onset of disease January 2021 now at 3-monthfollow-up interval.  No signs of recurrence of CNVM post  Avastin 3 months previous.  Repeat injection today and follow-up in 3 months as maintenance  3.  Ophthalmic Meds Ordered this visit:  Meds ordered this encounter  Medications  . bevacizumab (AVASTIN) SOSY 2.5 mg       Return in about 3 months (around 06/10/2021) for DILATE OU, AVASTIN OCT, OS.  There are no Patient Instructions on file for this visit.   Explained the diagnoses, plan, and follow up with the patient and they expressed understanding.  Patient expressed understanding of the importance of proper follow up care.   GClent DemarkRankin M.D. Diseases & Surgery of the Retina and Vitreous Retina & Diabetic [place]05/05/22     Abbreviations: M myopia (nearsighted); A astigmatism; H hyperopia (farsighted); P presbyopia; Mrx spectacle prescription;  CTL contact lenses; OD right eye; OS left eye; OU both eyes  XT exotropia; ET esotropia; PEK punctate epithelial keratitis; PEE punctate epithelial erosions; DES dry eye syndrome; MGD meibomian gland dysfunction; ATs artificial tears; PFAT's preservative free artificial tears; NCastlewoodnuclear sclerotic cataract; PSC posterior subcapsular cataract; ERM epi-retinal membrane; PVD posterior vitreous detachment; RD retinal detachment; DM diabetes mellitus; DR diabetic retinopathy; NPDR non-proliferative diabetic retinopathy; PDR proliferative diabetic retinopathy; CSME clinically significant macular edema; DME diabetic macular edema; dbh dot blot hemorrhages; CWS cotton wool spot; POAG primary open angle glaucoma; C/D cup-to-disc ratio; HVF humphrey visual field; GVF goldmann visual field; OCT optical coherence tomography; IOP intraocular pressure; BRVO Branch retinal vein occlusion; CRVO central retinal vein occlusion; CRAO central retinal artery occlusion; BRAO branch retinal artery occlusion; RT retinal tear; SB scleral buckle; PPV pars plana vitrectomy; VH Vitreous hemorrhage; PRP panretinal laser photocoagulation; IVK intravitreal kenalog; VMT  vitreomacular traction; MH Macular hole;  NVD neovascularization of the disc; NVE neovascularization elsewhere; AREDS age related eye disease study; ARMD age related macular degeneration; POAG primary open angle glaucoma; EBMD epithelial/anterior basement membrane dystrophy; ACIOL anterior chamber intraocular lens; IOL intraocular lens; PCIOL posterior chamber intraocular lens; Phaco/IOL phacoemulsification with intraocular lens placement; PFloyd Hillphotorefractive keratectomy; LASIK laser assisted in situ keratomileusis; HTN hypertension; DM diabetes mellitus; COPD chronic obstructive pulmonary disease

## 2021-03-10 NOTE — Assessment & Plan Note (Signed)
No change, no signs of CNVM

## 2021-03-13 DIAGNOSIS — M6281 Muscle weakness (generalized): Secondary | ICD-10-CM | POA: Diagnosis not present

## 2021-03-13 DIAGNOSIS — S72002A Fracture of unspecified part of neck of left femur, initial encounter for closed fracture: Secondary | ICD-10-CM | POA: Diagnosis not present

## 2021-03-14 DIAGNOSIS — S72142D Displaced intertrochanteric fracture of left femur, subsequent encounter for closed fracture with routine healing: Secondary | ICD-10-CM | POA: Diagnosis not present

## 2021-03-14 DIAGNOSIS — R52 Pain, unspecified: Secondary | ICD-10-CM | POA: Diagnosis not present

## 2021-03-16 DIAGNOSIS — S72142D Displaced intertrochanteric fracture of left femur, subsequent encounter for closed fracture with routine healing: Secondary | ICD-10-CM | POA: Diagnosis not present

## 2021-03-21 ENCOUNTER — Telehealth: Payer: Self-pay | Admitting: Internal Medicine

## 2021-03-21 DIAGNOSIS — S72142D Displaced intertrochanteric fracture of left femur, subsequent encounter for closed fracture with routine healing: Secondary | ICD-10-CM | POA: Diagnosis not present

## 2021-03-21 NOTE — Telephone Encounter (Signed)
Patient made aware of Karren Cobble, PharmD response and recommendation. Patients verbalized understanding and voiced appreciation for the assistance.  While on the call scheduled the patient 6 months appt with Dr. Saunders Revel 06/23/21 @ 2:00pm. Patient is aware of the appt date and time.

## 2021-03-21 NOTE — Telephone Encounter (Signed)
Will route to our PharmDs to advise

## 2021-03-21 NOTE — Telephone Encounter (Signed)
Patient is calling in to discuss drug interactions between lisinopril 20 mg/HCTV 25 mg and Meloxicam 15 mg (prescribed by Dr. Sabra Heck)  Please advise

## 2021-03-21 NOTE — Telephone Encounter (Signed)
Lisinopril and meloxicam can cause an increase in blood pressure and can increase the risk of kidney damage.  Recommend she use the lowest meloxicam dose she needs for the shortest period of time

## 2021-03-23 DIAGNOSIS — S72142D Displaced intertrochanteric fracture of left femur, subsequent encounter for closed fracture with routine healing: Secondary | ICD-10-CM | POA: Diagnosis not present

## 2021-03-30 DIAGNOSIS — S72142D Displaced intertrochanteric fracture of left femur, subsequent encounter for closed fracture with routine healing: Secondary | ICD-10-CM | POA: Diagnosis not present

## 2021-04-13 DIAGNOSIS — M6281 Muscle weakness (generalized): Secondary | ICD-10-CM | POA: Diagnosis not present

## 2021-04-13 DIAGNOSIS — S72002A Fracture of unspecified part of neck of left femur, initial encounter for closed fracture: Secondary | ICD-10-CM | POA: Diagnosis not present

## 2021-04-18 DIAGNOSIS — S72142D Displaced intertrochanteric fracture of left femur, subsequent encounter for closed fracture with routine healing: Secondary | ICD-10-CM | POA: Diagnosis not present

## 2021-05-04 ENCOUNTER — Telehealth: Payer: Self-pay | Admitting: *Deleted

## 2021-05-04 NOTE — Telephone Encounter (Signed)
Patient called stating that she just found out that her daughter is positive for covid. Patient stated that her daughter started with cold symptoms Thursday and tested positive for covid today. Patient stated that her daughter is in and out of her home daily. Patient stated that she has an appointment scheduled 05/06/21 with Dr. Damita Dunnings for back pain. Patient stated that she had a broken hip and is now having back pain. Patient was advised with her exposure to covid we can not bring her into the office Friday. Patient stated that she is feeling okay and not having any symptoms at this time. Patient wants to know when Dr. Damita Dunnings thinks she should test and what precautions she should take. Patient's appointment was cancelled for Friday. Patient wants to know when she can reschedule her appointment to come in to see Dr. Damita Dunnings.

## 2021-05-04 NOTE — Telephone Encounter (Signed)
If she has any symptoms at all, then it would be reasonable to take a test.  Otherwise she can wait a few days from now to try a home test.  It is noted patients may not immediately test positive with the current circulating strain of COVID.  If she is 5 days out from her last exposure without developing symptoms then she should be able to come into the clinic with a mask.  If she is feeling worse in the meantime then let us know.  If she wants to change the scheduled visit over to a video visit for the first then that is fine with me, but I would not be able to examine her back via video.    How bad is her back pain in the meantime and what is being done for that already?  Please let me know.  Thanks.

## 2021-05-04 NOTE — Telephone Encounter (Signed)
Patient notified as instructed by telephone and verbalized understanding. Patient stated that she is not having any covid symptoms at this time.Patient stated that her back does not hurt all the time. Patient stated that her back hurts when she stands a lot. Patient stated that she can take Ibuprofen and lay down and then her back feels better. Patient stated at times the pain level may be a 4-5. Patient stated that she does not feel that a video visit would help much, so appointment has been cancelled for Friday. Patient stated that she will call back and reschedule the appointment after she makes sure that she does not get covid. Patient was given ER precautions and she verbalized understanding. Patient denies any UTI symptoms and is urinating fine.

## 2021-05-04 NOTE — Telephone Encounter (Signed)
Noted. Thanks.

## 2021-05-06 ENCOUNTER — Ambulatory Visit: Payer: Medicare HMO | Admitting: Family Medicine

## 2021-05-13 ENCOUNTER — Telehealth: Payer: Self-pay | Admitting: *Deleted

## 2021-05-13 DIAGNOSIS — S72002A Fracture of unspecified part of neck of left femur, initial encounter for closed fracture: Secondary | ICD-10-CM | POA: Diagnosis not present

## 2021-05-13 DIAGNOSIS — M6281 Muscle weakness (generalized): Secondary | ICD-10-CM | POA: Diagnosis not present

## 2021-05-13 NOTE — Chronic Care Management (AMB) (Signed)
Please note that the patient has been referred for care management services but has not had a face to face visit with the primary care provider in the last 12 month period. This patient has been referred to the appropriate practice personnel for assistance with scheduling a PCP appointment.

## 2021-05-30 ENCOUNTER — Ambulatory Visit (INDEPENDENT_AMBULATORY_CARE_PROVIDER_SITE_OTHER): Payer: Medicare HMO | Admitting: Family Medicine

## 2021-05-30 ENCOUNTER — Other Ambulatory Visit: Payer: Self-pay

## 2021-05-30 ENCOUNTER — Encounter: Payer: Self-pay | Admitting: Family Medicine

## 2021-05-30 VITALS — BP 142/84 | HR 74 | Temp 98.2°F | Ht 61.0 in | Wt 139.4 lb

## 2021-05-30 DIAGNOSIS — Z1231 Encounter for screening mammogram for malignant neoplasm of breast: Secondary | ICD-10-CM

## 2021-05-30 DIAGNOSIS — E538 Deficiency of other specified B group vitamins: Secondary | ICD-10-CM

## 2021-05-30 DIAGNOSIS — Z8781 Personal history of (healed) traumatic fracture: Secondary | ICD-10-CM

## 2021-05-30 DIAGNOSIS — I10 Essential (primary) hypertension: Secondary | ICD-10-CM

## 2021-05-30 DIAGNOSIS — M810 Age-related osteoporosis without current pathological fracture: Secondary | ICD-10-CM | POA: Diagnosis not present

## 2021-05-30 DIAGNOSIS — E785 Hyperlipidemia, unspecified: Secondary | ICD-10-CM

## 2021-05-30 LAB — COMPREHENSIVE METABOLIC PANEL
ALT: 25 U/L (ref 0–35)
AST: 33 U/L (ref 0–37)
Albumin: 4.6 g/dL (ref 3.5–5.2)
Alkaline Phosphatase: 49 U/L (ref 39–117)
BUN: 25 mg/dL — ABNORMAL HIGH (ref 6–23)
CO2: 28 mEq/L (ref 19–32)
Calcium: 9.9 mg/dL (ref 8.4–10.5)
Chloride: 102 mEq/L (ref 96–112)
Creatinine, Ser: 0.68 mg/dL (ref 0.40–1.20)
GFR: 84.9 mL/min (ref >60.00)
Glucose, Bld: 90 mg/dL (ref 70–99)
Potassium: 4.5 mEq/L (ref 3.5–5.1)
Sodium: 140 mEq/L (ref 135–145)
Total Bilirubin: 0.7 mg/dL (ref 0.2–1.2)
Total Protein: 7.3 g/dL (ref 6.0–8.3)

## 2021-05-30 LAB — CBC WITH DIFFERENTIAL/PLATELET
Basophils Absolute: 0 10*3/uL (ref 0.0–0.1)
Basophils Relative: 0.5 % (ref 0.0–3.0)
Eosinophils Absolute: 0 10*3/uL (ref 0.0–0.7)
Eosinophils Relative: 0.7 % (ref 0.0–5.0)
HCT: 39.7 % (ref 36.0–46.0)
Hemoglobin: 13.3 g/dL (ref 12.0–15.0)
Lymphocytes Relative: 44 % (ref 12.0–46.0)
Lymphs Abs: 2.4 10*3/uL (ref 0.7–4.0)
MCHC: 33.5 g/dL (ref 30.0–36.0)
MCV: 95.1 fl (ref 78.0–100.0)
Monocytes Absolute: 0.4 10*3/uL (ref 0.1–1.0)
Monocytes Relative: 8.3 % (ref 3.0–12.0)
Neutro Abs: 2.5 10*3/uL (ref 1.4–7.7)
Neutrophils Relative %: 46.5 % (ref 43.0–77.0)
Platelets: 190 10*3/uL (ref 150.0–400.0)
RBC: 4.17 Mil/uL (ref 3.87–5.11)
RDW: 13.9 % (ref 11.5–15.5)
WBC: 5.4 10*3/uL (ref 4.0–10.5)

## 2021-05-30 LAB — TSH: TSH: 3.51 u[IU]/mL (ref 0.35–5.50)

## 2021-05-30 LAB — VITAMIN D 25 HYDROXY (VIT D DEFICIENCY, FRACTURES): VITD: 32.06 ng/mL (ref 30.00–100.00)

## 2021-05-30 LAB — LIPID PANEL
Cholesterol: 154 mg/dL (ref 0–200)
HDL: 51.8 mg/dL (ref >39.00)
LDL Cholesterol: 78 mg/dL (ref 0–99)
NonHDL: 101.92
Total CHOL/HDL Ratio: 3
Triglycerides: 118 mg/dL (ref 0.0–149.0)
VLDL: 23.6 mg/dL (ref 0.0–40.0)

## 2021-05-30 LAB — VITAMIN B12: Vitamin B-12: 827 pg/mL (ref 211–911)

## 2021-05-30 MED ORDER — MELOXICAM 15 MG PO TABS: 7.5000 mg | ORAL_TABLET | Freq: Every day | ORAL | Status: DC

## 2021-05-30 NOTE — Progress Notes (Signed)
This visit occurred during the SARS-CoV-2 public health emergency.  Safety protocols were in place, including screening questions prior to the visit, additional usage of staff PPE, and extensive cleaning of exam room while observing appropriate contact time as indicated for disinfecting solutions.  She is walking with cane at baseline.  She was helping dedecorate in 11/2020 from [place], tripped at home and fell.  Hip fx dx'd. D/w pt.  Had surgery then SNF placement for rehab.  Back home now.    She has more lower back pain when she is more active, when up and walking more.  Laying down helps. B lower back pain, was initially on the L side only.  No radicular pain.  No FCNAVD.  No B/B.  Prev imaging with degenerative changes noted.  Tylenol helps a little.  She is taking 4-5 extra strength tylenol most days.  No recent nsaid use.  She has L trochanteric bursitis, more pain laying on L side.  That is separate from the back pain.    She had been off lisinopril for surgery.  She restarted but her BP was likely too low and she was lightheaded.  Stopped med and felt better.    She is due for follow-up labs.  History of hyperlipidemia.  On Zetia.  See notes on labs for  History of B12 deficiency on replacement.  See notes on labs.  Due for bone density and mammogram, discussed getting bone density done given history of fracture and it makes sense to get mammogram done concurrently.  Meds, vitals, and allergies reviewed.   ROS: Per HPI unless specifically indicated in ROS section   Nad Ncat Neck supple, no LA Rrr Ctab Abd soft, not ttp L trochanteric area tender to palpation consistent with trochanteric bursitis. L paraspinal lower T spine ttp, no rash.  No midline pain. Walking with cane.  Able to bear weight.  30 minutes were devoted to patient care in this encounter (this includes time spent reviewing the patient's file/history, interviewing and examining the patient, counseling/reviewing plan  with patient).

## 2021-05-30 NOTE — Patient Instructions (Signed)
Go to the lab on the way out.   If you have mychart we'll likely use that to update you.    Take care.  Glad to see you. We'll call about

## 2021-06-01 DIAGNOSIS — Z8781 Personal history of (healed) traumatic fracture: Secondary | ICD-10-CM | POA: Insufficient documentation

## 2021-06-01 NOTE — Assessment & Plan Note (Signed)
History of B12 deficiency.  See notes on labs.

## 2021-06-01 NOTE — Assessment & Plan Note (Signed)
See notes on labs.  Follow-up bone density test pending.  See results above.  Continue using cane.  Fall cautions discussed with patient.  Reasonable to continue Tylenol as needed.  She can use ice for trochanteric bursitis.

## 2021-06-01 NOTE — Assessment & Plan Note (Signed)
History of.  Continue Zetia.  See notes on labs.

## 2021-06-01 NOTE — Assessment & Plan Note (Signed)
History of, would stay off lisinopril given the above.  She was lightheaded when she was taking it.  She stopped and felt better.

## 2021-06-06 ENCOUNTER — Telehealth: Payer: Self-pay | Admitting: Family Medicine

## 2021-06-06 NOTE — Telephone Encounter (Signed)
Mrs. Neuenfeldt called in wanted to know if she can get a referral for the mammogram and bone density scan to go to [facility].  Address [address], [place], [place] [address], Phone: [phone]

## 2021-06-06 NOTE — Addendum Note (Signed)
Addended by: Sherrilee Gilles B on: 06/06/2021 10:49 AM   Modules accepted: Orders

## 2021-06-06 NOTE — Telephone Encounter (Signed)
I have refaxed orders to [facility] per patient request. Patient has been notified this was done and she can call and schedule her appt.

## 2021-06-09 DIAGNOSIS — Z780 Asymptomatic menopausal state: Secondary | ICD-10-CM | POA: Diagnosis not present

## 2021-06-09 DIAGNOSIS — M810 Age-related osteoporosis without current pathological fracture: Secondary | ICD-10-CM | POA: Diagnosis not present

## 2021-06-09 DIAGNOSIS — Z1231 Encounter for screening mammogram for malignant neoplasm of breast: Secondary | ICD-10-CM | POA: Diagnosis not present

## 2021-06-09 LAB — HM DEXA SCAN

## 2021-06-13 DIAGNOSIS — M6281 Muscle weakness (generalized): Secondary | ICD-10-CM | POA: Diagnosis not present

## 2021-06-13 DIAGNOSIS — S72002A Fracture of unspecified part of neck of left femur, initial encounter for closed fracture: Secondary | ICD-10-CM | POA: Diagnosis not present

## 2021-06-16 ENCOUNTER — Encounter (INDEPENDENT_AMBULATORY_CARE_PROVIDER_SITE_OTHER): Payer: Self-pay | Admitting: Ophthalmology

## 2021-06-16 ENCOUNTER — Ambulatory Visit (INDEPENDENT_AMBULATORY_CARE_PROVIDER_SITE_OTHER): Payer: Medicare HMO | Admitting: Ophthalmology

## 2021-06-16 ENCOUNTER — Other Ambulatory Visit: Payer: Self-pay

## 2021-06-16 ENCOUNTER — Telehealth: Payer: Self-pay

## 2021-06-16 DIAGNOSIS — H353221 Exudative age-related macular degeneration, left eye, with active choroidal neovascularization: Secondary | ICD-10-CM

## 2021-06-16 DIAGNOSIS — H353212 Exudative age-related macular degeneration, right eye, with inactive choroidal neovascularization: Secondary | ICD-10-CM | POA: Diagnosis not present

## 2021-06-16 DIAGNOSIS — H353114 Nonexudative age-related macular degeneration, right eye, advanced atrophic with subfoveal involvement: Secondary | ICD-10-CM

## 2021-06-16 MED ORDER — BEVACIZUMAB 2.5 MG/0.1ML IZ SOSY
2.5000 mg | PREFILLED_SYRINGE | INTRAVITREAL | Status: AC | PRN
  Administered 2021-06-16: 2.5 mg via INTRAVITREAL

## 2021-06-16 NOTE — Progress Notes (Signed)
06/16/2021     CHIEF COMPLAINT Patient presents for Retina Follow Up   HISTORY OF PRESENT ILLNESS: Susan [name] is a 75 y.o. female who presents to the clinic today for:   HPI     Retina Follow Up           Diagnosis: Wet AMD   Laterality: left eye   Onset: 3 months ago   Severity: mild   Duration: 3 months   Course: gradually worsening         Comments   3 month fu ou and Avastin OS  Pt states, "I am having much more trouble reading. My vision seems very dim. I am having to use the flashlight on my phone in order to read."       Last edited by Kendra Opitz, COA on 06/16/2021  1:47 PM.      Referring physician: Tonia Ghent, MD [place],  [place] [address]  HISTORICAL INFORMATION:   Selected notes from the MEDICAL RECORD NUMBER    Lab Results  Component Value Date   HGBA1C 5.5 11/14/2020     CURRENT MEDICATIONS: No current outpatient medications on file. (Ophthalmic Drugs)   No current facility-administered medications for this visit. (Ophthalmic Drugs)   Current Outpatient Medications (Other)  Medication Sig   acetaminophen (TYLENOL) 500 MG tablet Take 1,000 mg by mouth 2 (two) times daily.   aspirin 81 MG tablet Take 81 mg by mouth daily.   Cholecalciferol 1000 units tablet Take 2 tablets (2,000 Units total) by mouth daily.   ezetimibe (ZETIA) 10 MG tablet Take 1 tablet (10 mg total) by mouth daily.   feeding supplement (ENSURE ENLIVE / ENSURE PLUS) LIQD Take 237 mLs by mouth 2 (two) times daily between meals.   meloxicam (MOBIC) 15 MG tablet Take 0.5-1 tablets (7.5-15 mg total) by mouth daily. With food.  Okay to take with tylenol.   vitamin B-12 (CYANOCOBALAMIN) 1000 MCG tablet Take 1,000 mcg by mouth daily.   No current facility-administered medications for this visit. (Other)      REVIEW OF SYSTEMS:    ALLERGIES Allergies  Allergen Reactions   Lipitor [Atorvastatin Calcium] Other (See Comments)     Aches on med at 26m a day.    Penicillins Swelling and Rash    Rash and "swelling" in high school. Tolerated cefazolin 11/14/2020    Sulfonamide Derivatives Rash    PAST MEDICAL HISTORY Past Medical History:  Diagnosis Date   Colon cancer screening    Diverticulosis of colon    Exudative age-related macular degeneration of right eye with active choroidal neovascularization (HSaluda 03/23/2020   Hyperlipidemia    Hypertension    IBS (irritable bowel syndrome)    Leaky heart valve 10/07/2015   Macular degeneration    Migraine, unspecified, without mention of intractable migraine without mention of status migrainosus    NASH (nonalcoholic steatohepatitis)    Nonobstructive atherosclerosis of coronary artery 2016   Cardiac CTA with mild proximal LAD disease; CAC score 74.   Ocular migraine    Osteoporosis    on Fosamax for 5 years   Other abnormal glucose    Other chronic nonalcoholic liver disease    LFTs normalized with weight loss 2013   Other screening mammogram    Symptomatic menopausal or female climacteric states    Thyroid disease    Tubular adenoma of colon    Urinary tract infection, site not specified    Past  Surgical History:  Procedure Laterality Date   abdominal ultrasound  04/12/2004 & 6/06   positive gallstones   ANKLE FRACTURE SURGERY  ~2002   pinning   BTL  30+ years ago   CATARACT EXTRACTION  2013   B eyes   COLONOSCOPY  06/08/06   Polyps, divertics (Dr. Sharlett Iles)   CT abdomen  04/29/04   Positive gallstones   INTRAMEDULLARY (IM) NAIL INTERTROCHANTERIC Left 11/14/2020   Procedure: INTRAMEDULLARY (IM) NAIL INTERTROCHANTRIC;  Surgeon: Earnestine Leys, MD;  Location: [facility] ORS;  Service: Orthopedics;  Laterality: Left;   TONSILLECTOMY AND ADENOIDECTOMY  Age 58   ultrasound pelvis  6/06   Negative    FAMILY HISTORY Family History  Problem Relation Age of Onset   Depression Mother        Manic depression   Diabetes Mother        Type 2   Breast cancer Mother     Heart disease Mother    Colon polyps Father    Heart attack Father 70   Breast cancer Maternal Aunt    Breast cancer Paternal Aunt    Crohn's disease Brother    Osteoporosis Brother    Cancer Neg Hx    Colon cancer Neg Hx     SOCIAL HISTORY Social History   Tobacco Use   Smoking status: Former    Types: Cigarettes    Quit date: 03/19/2001    Years since quitting: 20.2   Smokeless tobacco: Never   Tobacco comments:    Quit in 2001  Vaping Use   Vaping Use: Never used  Substance Use Topics   Alcohol use: Not Currently    Comment: 1 glass of wine per month   Drug use: No    Comment: Uses CBD oil to help calm down         OPHTHALMIC EXAM:  Base Eye Exam     Visual Acuity (ETDRS)       Right Left   Dist cc CF at 3' 20/30         Tonometry (Tonopen, 1:49 PM)       Right Left   Pressure 12 12         Pupils       Pupils Dark Light Shape React APD   Right PERRL 4 3 Round Brisk None   Left PERRL 4 3 Round Brisk None         Visual Fields (Counting fingers)       Left Right    Full Full         Extraocular Movement       Right Left    Full Full         Neuro/Psych     Oriented x3: Yes   Mood/Affect: Normal         Dilation     Both eyes: 1.0% Mydriacyl, 2.5% Phenylephrine @ 1:49 PM           Slit Lamp and Fundus Exam     External Exam       Right Left   External Normal Normal         Slit Lamp Exam       Right Left   Lids/Lashes Normal Normal   Conjunctiva/Sclera White and quiet White and quiet   Cornea Clear Clear   Anterior Chamber Deep and quiet Deep and quiet   Iris Round and reactive Round and reactive   Lens Posterior chamber intraocular lens Posterior chamber  intraocular lens   Anterior Vitreous Normal Normal         Fundus Exam       Right Left   Posterior Vitreous Posterior vitreous detachment Posterior vitreous detachment   Disc Normal Normal   C/D Ratio 0.55 0.55   Macula Geographic  atrophy centrally, 6-7 DA size Disciform scar, Geographic atrophy near the FAZ, no exudates, Retinal pigment epithelial mottling   Vessels Normal Normal   Periphery Normal Normal            IMAGING AND PROCEDURES  Imaging and Procedures for 06/16/21  OCT, Retina - OU - Both Eyes       Right Eye Quality was good. Scan locations included subfoveal. Central Foveal Thickness: 204. Progression has been stable. Findings include central retinal atrophy, inner retinal atrophy, outer retinal atrophy, abnormal foveal contour, retinal drusen , no IRF, no SRF.   Left Eye Quality was good. Scan locations included subfoveal. Central Foveal Thickness: 264. Progression has been stable. Findings include abnormal foveal contour, no SRF, disciform scar, subretinal scarring, central retinal atrophy.   Notes OD, no active CNVM, no OS with much less subretinal fluid CME as compared to January 2021.  Currently at 88-monthfollow-up left eye  Repeat injection OS today intravitreal Avastin and follow-up next and then 3 months to monitor and maintain this monocular patient     Intravitreal Injection, Pharmacologic Agent - OS - Left Eye       Time Out 06/16/2021. 2:22 PM. Confirmed correct patient, procedure, site, and patient consented.   Anesthesia Topical anesthesia was used. Anesthetic medications included [place] 3.5%.   Procedure Preparation included Ofloxacin , 10% betadine to eyelids, 5% betadine to ocular surface. A 30 gauge needle was used.   Injection: 2.5 mg bevacizumab 2.5 MG/0.1ML   Route: Intravitreal, Site: Left Eye   NDC: 7930-643-3840  Post-op Post injection exam found visual acuity of at least counting fingers. The patient tolerated the procedure well. There were no complications. The patient received written and verbal post procedure care education. Post injection medications were not given.              ASSESSMENT/PLAN:  Advanced nonexudative age-related macular  degeneration of right eye with subfoveal involvement Accounts for acuity no signs of active CNVM  Exudative age-related macular degeneration of right eye with inactive choroidal neovascularization (HCC) No sign of recurrent CNVM     ICD-10-CM   1. Exudative age-related macular degeneration of left eye with active choroidal neovascularization (HCC)  H35.3221 OCT, Retina - OU - Both Eyes    Intravitreal Injection, Pharmacologic Agent - OS - Left Eye    bevacizumab (AVASTIN) SOSY 2.5 mg    2. Advanced nonexudative age-related macular degeneration of right eye with subfoveal involvement  H35.3114     3. Exudative age-related macular degeneration of right eye with inactive choroidal neovascularization ([facility]raven  H35.3212       1.  Monocular patient now with quiet wet AMD, no signs of recurrence of CNVM OS.  Stable now at 345-monthollow-up interval.  Significant geographic atrophy surrounds this area which in my opinion decreases the chance of recurrent CNVM.  We will repeat injection today to maintain and next follow-up visit in 14 to 15 weeks observation alone  2.  Dilate OU next no planned injection  3.  Ophthalmic Meds Ordered this visit:  Meds ordered this encounter  Medications   bevacizumab (AVASTIN) SOSY 2.5 mg       Return in  about 15 weeks (around 09/29/2021) for DILATE OU, COLOR FP, OCT, no planned injection.  There are no Patient Instructions on file for this visit.   Explained the diagnoses, plan, and follow up with the patient and they expressed understanding.  Patient expressed understanding of the importance of proper follow up care.   Clent Demark Ambri Miltner M.D. Diseases & Surgery of the Retina and Vitreous [facility] [place] 06/16/21     Abbreviations: M myopia (nearsighted); A astigmatism; H hyperopia (farsighted); P presbyopia; Mrx spectacle prescription;  CTL contact lenses; OD right eye; OS left eye; OU both eyes  XT exotropia; ET esotropia; PEK punctate  epithelial keratitis; PEE punctate epithelial erosions; DES dry eye syndrome; MGD meibomian gland dysfunction; ATs artificial tears; PFAT's preservative free artificial tears; [place] nuclear sclerotic cataract; PSC posterior subcapsular cataract; ERM epi-retinal membrane; PVD posterior vitreous detachment; RD retinal detachment; DM diabetes mellitus; DR diabetic retinopathy; NPDR non-proliferative diabetic retinopathy; PDR proliferative diabetic retinopathy; CSME clinically significant macular edema; DME diabetic macular edema; dbh dot blot hemorrhages; CWS cotton wool spot; POAG primary open angle glaucoma; C/D cup-to-disc ratio; HVF humphrey visual field; GVF goldmann visual field; OCT optical coherence tomography; IOP intraocular pressure; BRVO Branch retinal vein occlusion; CRVO central retinal vein occlusion; CRAO central retinal artery occlusion; BRAO branch retinal artery occlusion; RT retinal tear; SB scleral buckle; PPV pars plana vitrectomy; VH Vitreous hemorrhage; PRP panretinal laser photocoagulation; IVK intravitreal kenalog; VMT vitreomacular traction; MH Macular hole;  NVD neovascularization of the disc; NVE neovascularization elsewhere; AREDS age related eye disease study; ARMD age related macular degeneration; POAG primary open angle glaucoma; EBMD epithelial/anterior basement membrane dystrophy; ACIOL anterior chamber intraocular lens; IOL intraocular lens; PCIOL posterior chamber intraocular lens; Phaco/IOL phacoemulsification with intraocular lens placement; [place] photorefractive keratectomy; LASIK laser assisted in situ keratomileusis; HTN hypertension; DM diabetes mellitus; COPD chronic obstructive pulmonary disease

## 2021-06-16 NOTE — Assessment & Plan Note (Signed)
No sign of recurrent CNVM

## 2021-06-16 NOTE — Telephone Encounter (Signed)
Patient called back and informed me that she is aware that she needs to schedule an appointment for her additional imaging of her left breast. Patient stated that she would call them today. Patient stated appreciation for the follow up.

## 2021-06-16 NOTE — Assessment & Plan Note (Signed)
Accounts for acuity no signs of active CNVM

## 2021-06-16 NOTE — Telephone Encounter (Signed)
Called the patient and she did not answer, she was left a message to call back. In reference to her mammogram.

## 2021-06-22 ENCOUNTER — Encounter: Payer: Self-pay | Admitting: Internal Medicine

## 2021-06-22 ENCOUNTER — Other Ambulatory Visit: Payer: Self-pay

## 2021-06-22 ENCOUNTER — Ambulatory Visit: Payer: Medicare HMO | Admitting: Internal Medicine

## 2021-06-22 VITALS — BP 110/74 | HR 88 | Ht 61.0 in | Wt 134.0 lb

## 2021-06-22 DIAGNOSIS — I2510 Atherosclerotic heart disease of native coronary artery without angina pectoris: Secondary | ICD-10-CM | POA: Diagnosis not present

## 2021-06-22 DIAGNOSIS — I38 Endocarditis, valve unspecified: Secondary | ICD-10-CM

## 2021-06-22 DIAGNOSIS — I10 Essential (primary) hypertension: Secondary | ICD-10-CM

## 2021-06-22 NOTE — Patient Instructions (Signed)
Medication Instructions:   Your physician recommends that you continue on your current medications as directed. Please refer to the Current Medication list given to you today.  *If you need a refill on your cardiac medications before your next appointment, please call your pharmacy*   Lab Work:  None ordered  Testing/Procedures:  Your physician has requested that you have an echocardiogram in 6 months. Echocardiography is a painless test that uses sound waves to create images of your heart. It provides your doctor with information about the size and shape of your heart and how well your heart's chambers and valves are working. This procedure takes approximately one hour. There are no restrictions for this procedure.   Follow-Up: At [facility], you and your health needs are our priority.  As part of our continuing mission to provide you with exceptional heart care, we have created designated Provider Care Teams.  These Care Teams include your primary Cardiologist (physician) and Advanced Practice Providers (APPs -  Physician Assistants and Nurse Practitioners) who all work together to provide you with the care you need, when you need it.  We recommend signing up for the patient portal called "MyChart".  Sign up information is provided on this After Visit Summary.  MyChart is used to connect with patients for Virtual Visits (Telemedicine).  Patients are able to view lab/test results, encounter notes, upcoming appointments, etc.  Non-urgent messages can be sent to your provider as well.   To learn more about what you can do with MyChart, go to [email or web address].    Your next appointment:   6 month(s) (shortly AFTER echo)  The format for your next appointment:   In Person  Provider:   You may see Nelva Bush, MD or one of the following Advanced Practice Providers on your designated Care Team:   Murray Hodgkins, NP Christell Faith, PA-C Marrianne Mood, PA-C Cadence Tavernier,  [place]

## 2021-06-22 NOTE — Progress Notes (Signed)
Follow-up Outpatient Visit Date: 06/22/2021  Primary Care Provider: Tonia Ghent, MD [place] [address]  Chief Complaint: Follow-up valvular heart disease  HPI:  Ms. Droessler is a 75 y.o. female with history of aortic, mitral, and tricuspid regurgitation, nonobstructive coronary artery disease, hypertension, hyperlipidemia, thyroid disease, NASH, and IBS, who presents for follow-up of valvular heart disease.  I last saw her a year ago, at which time she complained of chronic fatigue and intermittent lightheadedness.  Today, Ms. Congleton reports that she has not been doing all that well over the last few months.  She initially fractured her left hip in January.  When she finally recovered from that, she began having recurrent back spasms that continue to bother her.  She is trying to ambulate some around the house but cannot stand very long.  She notes getting short of breath with muscle spasms in her back.  She notes occasional lower extremity edema after her hip fracture in the setting of being off lisinopril due to low blood pressures.  However, she has restarted lisinopril again and feels like her leg edema has resolved.  She denies chest pain, orthopnea, PND, and palpitations.  Lightheadedness has also improved over the last couple of weeks.  --------------------------------------------------------------------------------------------------  Cardiovascular History & Procedures: Cardiovascular Problems: Nonobstructive coronary artery disease Mitral and tricuspid regurgitation   Risk Factors: CAD, hypertension, hyperlipidemia, and age > 52   Cath/PCI: None   CV Surgery: None   EP Procedures and Devices: None   Non-Invasive Evaluation(s): TTE (06/08/2020): Normal LV size and function (EF 60-65%) with grade 1 diastolic dysfunction.  Mild RVH with normal size and contraction.  Mild pulmonary hypertension.  Trivial mitral regurgitation.  Moderate to severe  tricuspid regurgitation.  Mild to moderate aortic regurgitation.  Normal CVP. TTE (10/16/2018, Dr. Humphrey Rolls): Normal LV size with mild LVH.  LVEF 70-75%.  Mild to moderate mitral and moderate to severe tricuspid regurgitation.  Moderate aortic regurgitation. Mild to moderate pulmonary hypertension.  RV size and function.    Trivial pericardial effusion. Carotid Doppler (10/16/2018, Dr. Humphrey Rolls): Protruding plaque in both carotid bulbs without obstruction.  Increased in right mid ICA felt to be due to tortuosity. Cardiac CTA (09/14/15, Dr. Humphrey Rolls): CAC score 74.  Mild proximal LAD disease with haziness.  LCx and RCA are normal  Recent CV Pertinent Labs: Lab Results  Component Value Date   CHOL 154 05/30/2021   CHOL 169 10/17/2018   HDL 51.80 05/30/2021   LDLCALC 78 05/30/2021   LDLCALC 102 10/17/2018   TRIG 118.0 05/30/2021   TRIG 112 10/17/2018   CHOLHDL 3 05/30/2021   INR 1.1 11/13/2020   K 4.5 05/30/2021   MG 2.0 11/17/2020   BUN 25 (H) 05/30/2021   BUN 17 06/09/2020   CREATININE 0.68 05/30/2021   CREATININE 0.83 10/17/2018   CREATININE 0.77 01/12/2012    Past medical and surgical history were reviewed and updated in EPIC.  Current Meds  Medication Sig   acetaminophen (TYLENOL) 500 MG tablet Take 1,000 mg by mouth 2 (two) times daily.   aspirin 81 MG tablet Take 81 mg by mouth daily.   baclofen (LIORESAL) 10 MG tablet Take 10 mg by mouth 2 (two) times daily as needed for muscle spasms.   Cholecalciferol 1000 units tablet Take 2 tablets (2,000 Units total) by mouth daily.   ezetimibe (ZETIA) 10 MG tablet Take 1 tablet (10 mg total) by mouth daily.   lisinopril-hydrochlorothiazide (ZESTORETIC) 20-25 MG tablet  Take 1 tablet by mouth daily.   vitamin B-12 (CYANOCOBALAMIN) 1000 MCG tablet Take 1,000 mcg by mouth daily.    Allergies: Lipitor [atorvastatin calcium], Penicillins, and Sulfonamide derivatives  Social History   Tobacco Use   Smoking status: Former    Types: Cigarettes     Quit date: 03/19/2001    Years since quitting: 20.2   Smokeless tobacco: Never   Tobacco comments:    Quit in 2001  Vaping Use   Vaping Use: Never used  Substance Use Topics   Alcohol use: Not Currently    Comment: 1 glass of wine per month   Drug use: No    Comment: Uses CBD oil to help calm down    Family History  Problem Relation Age of Onset   Depression Mother        Manic depression   Diabetes Mother        Type 2   Breast cancer Mother    Heart disease Mother    Colon polyps Father    Heart attack Father 1   Breast cancer Maternal Aunt    Breast cancer Paternal Aunt    Crohn's disease Brother    Osteoporosis Brother    Cancer Neg Hx    Colon cancer Neg Hx     Review of Systems: A 12-system review of systems was performed and was negative except as noted in the HPI.  --------------------------------------------------------------------------------------------------  Physical Exam: BP 110/74 (BP Location: Right Arm, Patient Position: Sitting, Cuff Size: Normal)   Pulse 88   Ht 5' 1"  (1.549 m)   Wt 134 lb (60.8 kg)   SpO2 98%   BMI 25.32 kg/m   General:  NAD. Neck: No JVD or HJR. Lungs: Clear to auscultation bilaterally without wheezes or crackles. Heart: Regular rate and rhythm with 2/6 systolic murmur heard across the precordium. Abdomen: Soft, nontender, nondistended. Extremities: No lower extremity edema.  EKG: Normal sinus rhythm with short PR interval, left atrial enlargement, and incomplete right bundle branch block.  No significant change from prior tracing on 11/13/2020.  Lab Results  Component Value Date   WBC 5.4 05/30/2021   HGB 13.3 05/30/2021   HCT 39.7 05/30/2021   MCV 95.1 05/30/2021   PLT 190.0 05/30/2021    Lab Results  Component Value Date   NA 140 05/30/2021   K 4.5 05/30/2021   CL 102 05/30/2021   CO2 28 05/30/2021   BUN 25 (H) 05/30/2021   CREATININE 0.68 05/30/2021   GLUCOSE 90 05/30/2021   ALT 25 05/30/2021    Lab  Results  Component Value Date   CHOL 154 05/30/2021   HDL 51.80 05/30/2021   LDLCALC 78 05/30/2021   TRIG 118.0 05/30/2021   CHOLHDL 3 05/30/2021    --------------------------------------------------------------------------------------------------  ASSESSMENT AND PLAN: Valvular heart disease: Ms. Azizi appears euvolemic with relatively stable NYHA class II symptoms though her functional capacity is limited by her musculoskeletal issues.  We will defer medication changes at this time and plan to repeat an echocardiogram prior to our next visit in 6 months.  Coronary artery disease: No angina reported.  Continue aspirin and ezetimibe for secondary prevention, given history of statin intolerance.  Hypertension: Blood pressure well controlled today.  Continue lisinopril-HCTZ.  Follow-up: Return to clinic in 6 months with echocardiogram shortly before visit.  Nelva Bush, MD 06/22/2021 2:27 PM

## 2021-06-24 ENCOUNTER — Encounter: Payer: Self-pay | Admitting: Internal Medicine

## 2021-06-24 DIAGNOSIS — R928 Other abnormal and inconclusive findings on diagnostic imaging of breast: Secondary | ICD-10-CM | POA: Diagnosis not present

## 2021-06-24 LAB — HM MAMMOGRAPHY

## 2021-06-27 ENCOUNTER — Encounter: Payer: Self-pay | Admitting: Family Medicine

## 2021-06-27 ENCOUNTER — Telehealth: Payer: Self-pay

## 2021-06-27 ENCOUNTER — Encounter: Payer: Self-pay | Admitting: Internal Medicine

## 2021-06-27 NOTE — Telephone Encounter (Signed)
Called patient left message to call office. Per Mammogram report on 06/24/2021 Dr. Damita Dunnings wanted [place] to let her know following:  There are no suspicious masses, malignant type calcifications, architectural distortion or concerning asymmetries in the left breast. Repeat mammogram in 1 year.

## 2021-06-27 NOTE — Telephone Encounter (Signed)
Returning phone call from [place]

## 2021-06-27 NOTE — Telephone Encounter (Signed)
Called patient but no answer and VM was not set up for me to leave a message.

## 2021-06-28 NOTE — Telephone Encounter (Signed)
Called patient but no answer and VM was not set up for me to leave a message

## 2021-06-29 NOTE — Telephone Encounter (Signed)
Patient has been notified of results.

## 2021-07-14 DIAGNOSIS — M6281 Muscle weakness (generalized): Secondary | ICD-10-CM | POA: Diagnosis not present

## 2021-07-14 DIAGNOSIS — S72002A Fracture of unspecified part of neck of left femur, initial encounter for closed fracture: Secondary | ICD-10-CM | POA: Diagnosis not present

## 2021-07-25 ENCOUNTER — Other Ambulatory Visit: Payer: Self-pay

## 2021-07-25 ENCOUNTER — Encounter: Payer: Self-pay | Admitting: Family Medicine

## 2021-07-25 ENCOUNTER — Telehealth: Payer: Self-pay | Admitting: Family Medicine

## 2021-07-25 ENCOUNTER — Ambulatory Visit (INDEPENDENT_AMBULATORY_CARE_PROVIDER_SITE_OTHER): Payer: Medicare HMO | Admitting: Family Medicine

## 2021-07-25 DIAGNOSIS — M810 Age-related osteoporosis without current pathological fracture: Secondary | ICD-10-CM

## 2021-07-25 MED ORDER — BACLOFEN 10 MG PO TABS: 10.0000 mg | ORAL_TABLET | Freq: Two times a day (BID) | ORAL | 3 refills | Status: DC | PRN

## 2021-07-25 NOTE — Progress Notes (Signed)
Interactive audio and video telecommunications were attempted between this provider and patient, however failed, due to patient having technical difficulties OR patient did not have access to video capability.  We continued and completed visit with audio only.   Virtual Visit via Telephone Note  I connected with patient on 07/25/21  at 12:19 PM  by telephone and verified that I am speaking with the correct person using two identifiers.  Location of patient: home   Location of MD: [place] of referring provider (if blank then none associated): Names per persons and role in encounter:  MD: Earlyne Iba, Patient: Susan [name].    I discussed the limitations, risks, security and privacy concerns of performing an evaluation and management service by telephone and the availability of in person appointments. I also discussed with the patient that there may be a patient responsible charge related to this service. The patient expressed understanding and agreed to proceed.  CC: osteoporosis.    History of Present Illness:   She is having some lower back pain and spasms.  D/w pt about using baclofen in the meantime and seeking eval if not better.  No radicular leg pain.  No B/B sx.  No weakness.  Baclofen helps some with spasms.    D/w pt about DXA results and osteoporosis path/phys in general, including vit D and calcium.  S/p 5 years of fosamax.   She was worried about the risk of pain related to Prolia.  Per published reports, there is approximately a 5-10% incidence of pain with prolia.  Discussed routine Prolia cautions in general, related to osteonecrosis and atypical long bone fractures   Observations/Objective:nad Speech wnl  Assessment and Plan: She wanted to see ortho about her back pain and then consider treatment with Prolia. Continue baclofen in the meantime.   She agrees with plan.  She will update me as needed.  Follow Up Instructions: see [name].     I  discussed the assessment and treatment plan with the patient. The patient was provided an opportunity to ask questions and all were answered. The patient agreed with the plan and demonstrated an understanding of the instructions.   The patient was advised to call back or seek an in-person evaluation if the symptoms worsen or if the condition fails to improve as anticipated.  I provided 19 minutes of non-face-to-face time during this encounter.   Elsie Stain, MD

## 2021-07-25 NOTE — Telephone Encounter (Signed)
I have updated the chart.

## 2021-07-25 NOTE — Telephone Encounter (Signed)
Mrs. Susan [name] called in and stated that she forgot to tell Dr. Damita Dunnings about her vaccines.   1) covid vaccine -12/29/19 Pfizer 2) covid vaccine- 01/19/20- [place] 3) booster- 09/20/20

## 2021-07-27 ENCOUNTER — Telehealth: Payer: Self-pay | Admitting: Family Medicine

## 2021-07-27 NOTE — Telephone Encounter (Signed)
LVM for pt to rtn my call to schedule AWV with [facility].  

## 2021-07-27 NOTE — Assessment & Plan Note (Signed)
She wanted to see ortho about her back pain and then consider treatment with Prolia. Continue baclofen in the meantime.   She agrees with plan.  She will update me as needed.

## 2021-07-28 DIAGNOSIS — M4856XA Collapsed vertebra, not elsewhere classified, lumbar region, initial encounter for fracture: Secondary | ICD-10-CM | POA: Diagnosis not present

## 2021-07-29 ENCOUNTER — Telehealth: Payer: Self-pay | Admitting: Family Medicine

## 2021-07-29 NOTE — Telephone Encounter (Signed)
Pt called in stated she needs a copy of her bone scan . Please advise [phone]

## 2021-07-30 ENCOUNTER — Ambulatory Visit: Payer: Medicare HMO

## 2021-08-02 DIAGNOSIS — M41125 Adolescent idiopathic scoliosis, thoracolumbar region: Secondary | ICD-10-CM | POA: Diagnosis not present

## 2021-08-02 DIAGNOSIS — M5136 Other intervertebral disc degeneration, lumbar region: Secondary | ICD-10-CM | POA: Diagnosis not present

## 2021-08-02 DIAGNOSIS — M9903 Segmental and somatic dysfunction of lumbar region: Secondary | ICD-10-CM | POA: Diagnosis not present

## 2021-08-02 DIAGNOSIS — M62830 Muscle spasm of back: Secondary | ICD-10-CM | POA: Diagnosis not present

## 2021-08-02 NOTE — Telephone Encounter (Signed)
Patient has been notified that results have been placed in the mail as requested.

## 2021-08-03 DIAGNOSIS — M9903 Segmental and somatic dysfunction of lumbar region: Secondary | ICD-10-CM | POA: Diagnosis not present

## 2021-08-03 DIAGNOSIS — M5136 Other intervertebral disc degeneration, lumbar region: Secondary | ICD-10-CM | POA: Diagnosis not present

## 2021-08-03 DIAGNOSIS — M41125 Adolescent idiopathic scoliosis, thoracolumbar region: Secondary | ICD-10-CM | POA: Diagnosis not present

## 2021-08-03 DIAGNOSIS — M62830 Muscle spasm of back: Secondary | ICD-10-CM | POA: Diagnosis not present

## 2021-08-04 DIAGNOSIS — M62830 Muscle spasm of back: Secondary | ICD-10-CM | POA: Diagnosis not present

## 2021-08-04 DIAGNOSIS — M9903 Segmental and somatic dysfunction of lumbar region: Secondary | ICD-10-CM | POA: Diagnosis not present

## 2021-08-04 DIAGNOSIS — M41125 Adolescent idiopathic scoliosis, thoracolumbar region: Secondary | ICD-10-CM | POA: Diagnosis not present

## 2021-08-04 DIAGNOSIS — M5136 Other intervertebral disc degeneration, lumbar region: Secondary | ICD-10-CM | POA: Diagnosis not present

## 2021-08-08 DIAGNOSIS — M62830 Muscle spasm of back: Secondary | ICD-10-CM | POA: Diagnosis not present

## 2021-08-08 DIAGNOSIS — M5136 Other intervertebral disc degeneration, lumbar region: Secondary | ICD-10-CM | POA: Diagnosis not present

## 2021-08-08 DIAGNOSIS — M41125 Adolescent idiopathic scoliosis, thoracolumbar region: Secondary | ICD-10-CM | POA: Diagnosis not present

## 2021-08-08 DIAGNOSIS — M9903 Segmental and somatic dysfunction of lumbar region: Secondary | ICD-10-CM | POA: Diagnosis not present

## 2021-08-10 DIAGNOSIS — M62830 Muscle spasm of back: Secondary | ICD-10-CM | POA: Diagnosis not present

## 2021-08-10 DIAGNOSIS — M9903 Segmental and somatic dysfunction of lumbar region: Secondary | ICD-10-CM | POA: Diagnosis not present

## 2021-08-10 DIAGNOSIS — M41125 Adolescent idiopathic scoliosis, thoracolumbar region: Secondary | ICD-10-CM | POA: Diagnosis not present

## 2021-08-10 DIAGNOSIS — M5136 Other intervertebral disc degeneration, lumbar region: Secondary | ICD-10-CM | POA: Diagnosis not present

## 2021-08-11 DIAGNOSIS — M5136 Other intervertebral disc degeneration, lumbar region: Secondary | ICD-10-CM | POA: Diagnosis not present

## 2021-08-11 DIAGNOSIS — M62830 Muscle spasm of back: Secondary | ICD-10-CM | POA: Diagnosis not present

## 2021-08-11 DIAGNOSIS — M9903 Segmental and somatic dysfunction of lumbar region: Secondary | ICD-10-CM | POA: Diagnosis not present

## 2021-08-11 DIAGNOSIS — M41125 Adolescent idiopathic scoliosis, thoracolumbar region: Secondary | ICD-10-CM | POA: Diagnosis not present

## 2021-08-13 DIAGNOSIS — M6281 Muscle weakness (generalized): Secondary | ICD-10-CM | POA: Diagnosis not present

## 2021-08-13 DIAGNOSIS — S72002A Fracture of unspecified part of neck of left femur, initial encounter for closed fracture: Secondary | ICD-10-CM | POA: Diagnosis not present

## 2021-08-15 DIAGNOSIS — M41125 Adolescent idiopathic scoliosis, thoracolumbar region: Secondary | ICD-10-CM | POA: Diagnosis not present

## 2021-08-15 DIAGNOSIS — M5136 Other intervertebral disc degeneration, lumbar region: Secondary | ICD-10-CM | POA: Diagnosis not present

## 2021-08-15 DIAGNOSIS — M62830 Muscle spasm of back: Secondary | ICD-10-CM | POA: Diagnosis not present

## 2021-08-15 DIAGNOSIS — M9903 Segmental and somatic dysfunction of lumbar region: Secondary | ICD-10-CM | POA: Diagnosis not present

## 2021-08-17 DIAGNOSIS — M62830 Muscle spasm of back: Secondary | ICD-10-CM | POA: Diagnosis not present

## 2021-08-17 DIAGNOSIS — M5136 Other intervertebral disc degeneration, lumbar region: Secondary | ICD-10-CM | POA: Diagnosis not present

## 2021-08-17 DIAGNOSIS — M9903 Segmental and somatic dysfunction of lumbar region: Secondary | ICD-10-CM | POA: Diagnosis not present

## 2021-08-17 DIAGNOSIS — M41125 Adolescent idiopathic scoliosis, thoracolumbar region: Secondary | ICD-10-CM | POA: Diagnosis not present

## 2021-08-18 DIAGNOSIS — M41125 Adolescent idiopathic scoliosis, thoracolumbar region: Secondary | ICD-10-CM | POA: Diagnosis not present

## 2021-08-18 DIAGNOSIS — M9903 Segmental and somatic dysfunction of lumbar region: Secondary | ICD-10-CM | POA: Diagnosis not present

## 2021-08-18 DIAGNOSIS — M62830 Muscle spasm of back: Secondary | ICD-10-CM | POA: Diagnosis not present

## 2021-08-18 DIAGNOSIS — M5136 Other intervertebral disc degeneration, lumbar region: Secondary | ICD-10-CM | POA: Diagnosis not present

## 2021-08-22 DIAGNOSIS — M41125 Adolescent idiopathic scoliosis, thoracolumbar region: Secondary | ICD-10-CM | POA: Diagnosis not present

## 2021-08-22 DIAGNOSIS — M9903 Segmental and somatic dysfunction of lumbar region: Secondary | ICD-10-CM | POA: Diagnosis not present

## 2021-08-22 DIAGNOSIS — M5136 Other intervertebral disc degeneration, lumbar region: Secondary | ICD-10-CM | POA: Diagnosis not present

## 2021-08-22 DIAGNOSIS — M62830 Muscle spasm of back: Secondary | ICD-10-CM | POA: Diagnosis not present

## 2021-08-24 DIAGNOSIS — M9903 Segmental and somatic dysfunction of lumbar region: Secondary | ICD-10-CM | POA: Diagnosis not present

## 2021-08-24 DIAGNOSIS — M62830 Muscle spasm of back: Secondary | ICD-10-CM | POA: Diagnosis not present

## 2021-08-24 DIAGNOSIS — M5136 Other intervertebral disc degeneration, lumbar region: Secondary | ICD-10-CM | POA: Diagnosis not present

## 2021-08-24 DIAGNOSIS — M41125 Adolescent idiopathic scoliosis, thoracolumbar region: Secondary | ICD-10-CM | POA: Diagnosis not present

## 2021-08-25 DIAGNOSIS — M5136 Other intervertebral disc degeneration, lumbar region: Secondary | ICD-10-CM | POA: Diagnosis not present

## 2021-08-25 DIAGNOSIS — M62830 Muscle spasm of back: Secondary | ICD-10-CM | POA: Diagnosis not present

## 2021-08-25 DIAGNOSIS — M41125 Adolescent idiopathic scoliosis, thoracolumbar region: Secondary | ICD-10-CM | POA: Diagnosis not present

## 2021-08-25 DIAGNOSIS — M9903 Segmental and somatic dysfunction of lumbar region: Secondary | ICD-10-CM | POA: Diagnosis not present

## 2021-08-29 DIAGNOSIS — M62830 Muscle spasm of back: Secondary | ICD-10-CM | POA: Diagnosis not present

## 2021-08-29 DIAGNOSIS — M9903 Segmental and somatic dysfunction of lumbar region: Secondary | ICD-10-CM | POA: Diagnosis not present

## 2021-08-29 DIAGNOSIS — M41125 Adolescent idiopathic scoliosis, thoracolumbar region: Secondary | ICD-10-CM | POA: Diagnosis not present

## 2021-08-29 DIAGNOSIS — M5136 Other intervertebral disc degeneration, lumbar region: Secondary | ICD-10-CM | POA: Diagnosis not present

## 2021-08-30 DIAGNOSIS — M5136 Other intervertebral disc degeneration, lumbar region: Secondary | ICD-10-CM | POA: Diagnosis not present

## 2021-08-30 DIAGNOSIS — M41125 Adolescent idiopathic scoliosis, thoracolumbar region: Secondary | ICD-10-CM | POA: Diagnosis not present

## 2021-08-30 DIAGNOSIS — M62830 Muscle spasm of back: Secondary | ICD-10-CM | POA: Diagnosis not present

## 2021-08-30 DIAGNOSIS — M9903 Segmental and somatic dysfunction of lumbar region: Secondary | ICD-10-CM | POA: Diagnosis not present

## 2021-09-02 DIAGNOSIS — M62830 Muscle spasm of back: Secondary | ICD-10-CM | POA: Diagnosis not present

## 2021-09-02 DIAGNOSIS — M9903 Segmental and somatic dysfunction of lumbar region: Secondary | ICD-10-CM | POA: Diagnosis not present

## 2021-09-02 DIAGNOSIS — M5136 Other intervertebral disc degeneration, lumbar region: Secondary | ICD-10-CM | POA: Diagnosis not present

## 2021-09-02 DIAGNOSIS — M41125 Adolescent idiopathic scoliosis, thoracolumbar region: Secondary | ICD-10-CM | POA: Diagnosis not present

## 2021-09-05 DIAGNOSIS — M62830 Muscle spasm of back: Secondary | ICD-10-CM | POA: Diagnosis not present

## 2021-09-05 DIAGNOSIS — M41125 Adolescent idiopathic scoliosis, thoracolumbar region: Secondary | ICD-10-CM | POA: Diagnosis not present

## 2021-09-05 DIAGNOSIS — M5136 Other intervertebral disc degeneration, lumbar region: Secondary | ICD-10-CM | POA: Diagnosis not present

## 2021-09-05 DIAGNOSIS — M9903 Segmental and somatic dysfunction of lumbar region: Secondary | ICD-10-CM | POA: Diagnosis not present

## 2021-09-07 DIAGNOSIS — M5136 Other intervertebral disc degeneration, lumbar region: Secondary | ICD-10-CM | POA: Diagnosis not present

## 2021-09-07 DIAGNOSIS — M9903 Segmental and somatic dysfunction of lumbar region: Secondary | ICD-10-CM | POA: Diagnosis not present

## 2021-09-07 DIAGNOSIS — M62830 Muscle spasm of back: Secondary | ICD-10-CM | POA: Diagnosis not present

## 2021-09-07 DIAGNOSIS — M41125 Adolescent idiopathic scoliosis, thoracolumbar region: Secondary | ICD-10-CM | POA: Diagnosis not present

## 2021-09-09 DIAGNOSIS — M62830 Muscle spasm of back: Secondary | ICD-10-CM | POA: Diagnosis not present

## 2021-09-09 DIAGNOSIS — M9903 Segmental and somatic dysfunction of lumbar region: Secondary | ICD-10-CM | POA: Diagnosis not present

## 2021-09-09 DIAGNOSIS — M41125 Adolescent idiopathic scoliosis, thoracolumbar region: Secondary | ICD-10-CM | POA: Diagnosis not present

## 2021-09-09 DIAGNOSIS — M5136 Other intervertebral disc degeneration, lumbar region: Secondary | ICD-10-CM | POA: Diagnosis not present

## 2021-09-13 DIAGNOSIS — M9903 Segmental and somatic dysfunction of lumbar region: Secondary | ICD-10-CM | POA: Diagnosis not present

## 2021-09-13 DIAGNOSIS — M62830 Muscle spasm of back: Secondary | ICD-10-CM | POA: Diagnosis not present

## 2021-09-13 DIAGNOSIS — M5136 Other intervertebral disc degeneration, lumbar region: Secondary | ICD-10-CM | POA: Diagnosis not present

## 2021-09-13 DIAGNOSIS — M6281 Muscle weakness (generalized): Secondary | ICD-10-CM | POA: Diagnosis not present

## 2021-09-13 DIAGNOSIS — S72002A Fracture of unspecified part of neck of left femur, initial encounter for closed fracture: Secondary | ICD-10-CM | POA: Diagnosis not present

## 2021-09-13 DIAGNOSIS — M41125 Adolescent idiopathic scoliosis, thoracolumbar region: Secondary | ICD-10-CM | POA: Diagnosis not present

## 2021-09-15 DIAGNOSIS — M41125 Adolescent idiopathic scoliosis, thoracolumbar region: Secondary | ICD-10-CM | POA: Diagnosis not present

## 2021-09-15 DIAGNOSIS — M62830 Muscle spasm of back: Secondary | ICD-10-CM | POA: Diagnosis not present

## 2021-09-15 DIAGNOSIS — M5136 Other intervertebral disc degeneration, lumbar region: Secondary | ICD-10-CM | POA: Diagnosis not present

## 2021-09-15 DIAGNOSIS — M9903 Segmental and somatic dysfunction of lumbar region: Secondary | ICD-10-CM | POA: Diagnosis not present

## 2021-09-20 DIAGNOSIS — M62830 Muscle spasm of back: Secondary | ICD-10-CM | POA: Diagnosis not present

## 2021-09-20 DIAGNOSIS — M41125 Adolescent idiopathic scoliosis, thoracolumbar region: Secondary | ICD-10-CM | POA: Diagnosis not present

## 2021-09-20 DIAGNOSIS — M9903 Segmental and somatic dysfunction of lumbar region: Secondary | ICD-10-CM | POA: Diagnosis not present

## 2021-09-20 DIAGNOSIS — M5136 Other intervertebral disc degeneration, lumbar region: Secondary | ICD-10-CM | POA: Diagnosis not present

## 2021-09-22 DIAGNOSIS — M5136 Other intervertebral disc degeneration, lumbar region: Secondary | ICD-10-CM | POA: Diagnosis not present

## 2021-09-22 DIAGNOSIS — M41125 Adolescent idiopathic scoliosis, thoracolumbar region: Secondary | ICD-10-CM | POA: Diagnosis not present

## 2021-09-22 DIAGNOSIS — M9903 Segmental and somatic dysfunction of lumbar region: Secondary | ICD-10-CM | POA: Diagnosis not present

## 2021-09-22 DIAGNOSIS — M62830 Muscle spasm of back: Secondary | ICD-10-CM | POA: Diagnosis not present

## 2021-09-26 DIAGNOSIS — M9903 Segmental and somatic dysfunction of lumbar region: Secondary | ICD-10-CM | POA: Diagnosis not present

## 2021-09-26 DIAGNOSIS — M41125 Adolescent idiopathic scoliosis, thoracolumbar region: Secondary | ICD-10-CM | POA: Diagnosis not present

## 2021-09-26 DIAGNOSIS — M62830 Muscle spasm of back: Secondary | ICD-10-CM | POA: Diagnosis not present

## 2021-09-26 DIAGNOSIS — M5136 Other intervertebral disc degeneration, lumbar region: Secondary | ICD-10-CM | POA: Diagnosis not present

## 2021-09-28 DIAGNOSIS — M41125 Adolescent idiopathic scoliosis, thoracolumbar region: Secondary | ICD-10-CM | POA: Diagnosis not present

## 2021-09-28 DIAGNOSIS — M62830 Muscle spasm of back: Secondary | ICD-10-CM | POA: Diagnosis not present

## 2021-09-28 DIAGNOSIS — M5136 Other intervertebral disc degeneration, lumbar region: Secondary | ICD-10-CM | POA: Diagnosis not present

## 2021-09-28 DIAGNOSIS — M9903 Segmental and somatic dysfunction of lumbar region: Secondary | ICD-10-CM | POA: Diagnosis not present

## 2021-10-03 DIAGNOSIS — M5136 Other intervertebral disc degeneration, lumbar region: Secondary | ICD-10-CM | POA: Diagnosis not present

## 2021-10-03 DIAGNOSIS — M41125 Adolescent idiopathic scoliosis, thoracolumbar region: Secondary | ICD-10-CM | POA: Diagnosis not present

## 2021-10-03 DIAGNOSIS — M9903 Segmental and somatic dysfunction of lumbar region: Secondary | ICD-10-CM | POA: Diagnosis not present

## 2021-10-03 DIAGNOSIS — M62830 Muscle spasm of back: Secondary | ICD-10-CM | POA: Diagnosis not present

## 2021-10-06 ENCOUNTER — Encounter (INDEPENDENT_AMBULATORY_CARE_PROVIDER_SITE_OTHER): Payer: Medicare HMO | Admitting: Ophthalmology

## 2021-10-06 DIAGNOSIS — M41125 Adolescent idiopathic scoliosis, thoracolumbar region: Secondary | ICD-10-CM | POA: Diagnosis not present

## 2021-10-06 DIAGNOSIS — M9903 Segmental and somatic dysfunction of lumbar region: Secondary | ICD-10-CM | POA: Diagnosis not present

## 2021-10-06 DIAGNOSIS — M62830 Muscle spasm of back: Secondary | ICD-10-CM | POA: Diagnosis not present

## 2021-10-06 DIAGNOSIS — M5136 Other intervertebral disc degeneration, lumbar region: Secondary | ICD-10-CM | POA: Diagnosis not present

## 2021-10-10 DIAGNOSIS — M5136 Other intervertebral disc degeneration, lumbar region: Secondary | ICD-10-CM | POA: Diagnosis not present

## 2021-10-10 DIAGNOSIS — M62830 Muscle spasm of back: Secondary | ICD-10-CM | POA: Diagnosis not present

## 2021-10-10 DIAGNOSIS — M9903 Segmental and somatic dysfunction of lumbar region: Secondary | ICD-10-CM | POA: Diagnosis not present

## 2021-10-10 DIAGNOSIS — M41125 Adolescent idiopathic scoliosis, thoracolumbar region: Secondary | ICD-10-CM | POA: Diagnosis not present

## 2021-10-12 ENCOUNTER — Other Ambulatory Visit: Payer: Self-pay | Admitting: Internal Medicine

## 2021-10-13 DIAGNOSIS — M5136 Other intervertebral disc degeneration, lumbar region: Secondary | ICD-10-CM | POA: Diagnosis not present

## 2021-10-13 DIAGNOSIS — M9903 Segmental and somatic dysfunction of lumbar region: Secondary | ICD-10-CM | POA: Diagnosis not present

## 2021-10-13 DIAGNOSIS — M41125 Adolescent idiopathic scoliosis, thoracolumbar region: Secondary | ICD-10-CM | POA: Diagnosis not present

## 2021-10-13 DIAGNOSIS — M62830 Muscle spasm of back: Secondary | ICD-10-CM | POA: Diagnosis not present

## 2021-10-17 ENCOUNTER — Encounter (INDEPENDENT_AMBULATORY_CARE_PROVIDER_SITE_OTHER): Payer: Medicare HMO | Admitting: Ophthalmology

## 2021-10-17 ENCOUNTER — Encounter (INDEPENDENT_AMBULATORY_CARE_PROVIDER_SITE_OTHER): Payer: Self-pay | Admitting: Ophthalmology

## 2021-10-17 ENCOUNTER — Ambulatory Visit (INDEPENDENT_AMBULATORY_CARE_PROVIDER_SITE_OTHER): Payer: Medicare HMO | Admitting: Ophthalmology

## 2021-10-17 ENCOUNTER — Other Ambulatory Visit: Payer: Self-pay

## 2021-10-17 DIAGNOSIS — H353114 Nonexudative age-related macular degeneration, right eye, advanced atrophic with subfoveal involvement: Secondary | ICD-10-CM | POA: Diagnosis not present

## 2021-10-17 DIAGNOSIS — H353212 Exudative age-related macular degeneration, right eye, with inactive choroidal neovascularization: Secondary | ICD-10-CM

## 2021-10-17 DIAGNOSIS — H353123 Nonexudative age-related macular degeneration, left eye, advanced atrophic without subfoveal involvement: Secondary | ICD-10-CM | POA: Diagnosis not present

## 2021-10-17 DIAGNOSIS — H43811 Vitreous degeneration, right eye: Secondary | ICD-10-CM | POA: Diagnosis not present

## 2021-10-17 DIAGNOSIS — H353221 Exudative age-related macular degeneration, left eye, with active choroidal neovascularization: Secondary | ICD-10-CM

## 2021-10-17 NOTE — Assessment & Plan Note (Signed)
Now no activity, at 60-monthfollow-up today will observe today and follow-up in 3 months

## 2021-10-17 NOTE — Assessment & Plan Note (Signed)
Physiologic OU

## 2021-10-17 NOTE — Assessment & Plan Note (Signed)
No sign of CNVM

## 2021-10-17 NOTE — Progress Notes (Signed)
10/17/2021     CHIEF COMPLAINT Patient presents for  Chief Complaint  Patient presents with   Retina Follow Up      HISTORY OF PRESENT ILLNESS: Susan [name] is a 75 y.o. female who presents to the clinic today for:   HPI     Retina Follow Up           Diagnosis: Wet AMD   Laterality: left eye   Onset: 15 weeks ago   Severity: mild   Duration: 15 weeks   Course: gradually worsening         Comments   15 week fu OU oct fp. Patient states vision is stable and unchanged since last visit. Denies any new floaters or FOL.       Last edited by Laurin Coder on 10/17/2021  2:03 PM.      Referring physician: Tonia Ghent, MD [place],  [place] [address]  HISTORICAL INFORMATION:   Selected notes from the MEDICAL RECORD NUMBER    Lab Results  Component Value Date   HGBA1C 5.5 11/14/2020     CURRENT MEDICATIONS: No current outpatient medications on file. (Ophthalmic Drugs)   No current facility-administered medications for this visit. (Ophthalmic Drugs)   Current Outpatient Medications (Other)  Medication Sig   acetaminophen (TYLENOL) 500 MG tablet Take 1,000 mg by mouth 2 (two) times daily.   aspirin 81 MG tablet Take 81 mg by mouth daily.   baclofen (LIORESAL) 10 MG tablet Take 1 tablet (10 mg total) by mouth 2 (two) times daily as needed for muscle spasms.   Cholecalciferol 1000 units tablet Take 2 tablets (2,000 Units total) by mouth daily.   ezetimibe (ZETIA) 10 MG tablet TAKE 1 TABLET BY MOUTH DAILY.   lisinopril-hydrochlorothiazide (ZESTORETIC) 20-25 MG tablet TAKE 1 TABLET EVERY DAY   vitamin B-12 (CYANOCOBALAMIN) 1000 MCG tablet Take 1,000 mcg by mouth daily.   No current facility-administered medications for this visit. (Other)      REVIEW OF SYSTEMS:    ALLERGIES Allergies  Allergen Reactions   Lipitor [Atorvastatin Calcium] Other (See Comments)    Aches on med at 13m a day.    Penicillins Swelling  and Rash    Rash and "swelling" in high school. Tolerated cefazolin 11/14/2020    Sulfonamide Derivatives Rash    PAST MEDICAL HISTORY Past Medical History:  Diagnosis Date   Colon cancer screening    Diverticulosis of colon    Exudative age-related macular degeneration of right eye with active choroidal neovascularization ([place] 03/23/2020   Hyperlipidemia    Hypertension    IBS (irritable bowel syndrome)    Leaky heart valve 10/07/2015   Macular degeneration    Migraine, unspecified, without mention of intractable migraine without mention of status migrainosus    NASH (nonalcoholic steatohepatitis)    Nonobstructive atherosclerosis of coronary artery 2016   Cardiac CTA with mild proximal LAD disease; CAC score 74.   Ocular migraine    Osteoporosis    on Fosamax for 5 years   Other abnormal glucose    Other chronic nonalcoholic liver disease    LFTs normalized with weight loss 2013   Other screening mammogram    Symptomatic menopausal or female climacteric states    Thyroid disease    Tubular adenoma of colon    Urinary tract infection, site not specified    Past Surgical History:  Procedure Laterality Date   abdominal ultrasound  04/12/2004 & 6/06  positive gallstones   ANKLE FRACTURE SURGERY  ~2002   pinning   BTL  30+ years ago   CATARACT EXTRACTION  2013   B eyes   COLONOSCOPY  06/08/06   Polyps, divertics (Dr. Sharlett Iles)   CT abdomen  04/29/04   Positive gallstones   INTRAMEDULLARY (IM) NAIL INTERTROCHANTERIC Left 11/14/2020   Procedure: INTRAMEDULLARY (IM) NAIL INTERTROCHANTRIC;  Surgeon: Earnestine Leys, MD;  Location: [facility] ORS;  Service: Orthopedics;  Laterality: Left;   TONSILLECTOMY AND ADENOIDECTOMY  Age 55   ultrasound pelvis  6/06   Negative    FAMILY HISTORY Family History  Problem Relation Age of Onset   Depression Mother        Manic depression   Diabetes Mother        Type 2   Breast cancer Mother    Heart disease Mother    Colon polyps Father     Heart attack Father 62   Breast cancer Maternal Aunt    Breast cancer Paternal Aunt    Crohn's disease Brother    Osteoporosis Brother    Cancer Neg Hx    Colon cancer Neg Hx     SOCIAL HISTORY Social History   Tobacco Use   Smoking status: Former    Types: Cigarettes    Quit date: 03/19/2001    Years since quitting: 20.5   Smokeless tobacco: Never   Tobacco comments:    Quit in 2001  Vaping Use   Vaping Use: Never used  Substance Use Topics   Alcohol use: Not Currently    Comment: 1 glass of wine per month   Drug use: No    Comment: Uses CBD oil to help calm down         OPHTHALMIC EXAM:  Base Eye Exam     Visual Acuity (ETDRS)       Right Left   Dist cc CF at 3' 20/30 -1   Dist ph cc NI     Correction: Glasses         Tonometry (Tonopen, 2:06 PM)       Right Left   Pressure 13 14         Pupils       Pupils Dark Light APD   Right PERRL 4 3 None   Left PERRL 4 3 None         Extraocular Movement       Right Left    Full Full         Neuro/Psych     Oriented x3: Yes   Mood/Affect: Normal         Dilation     Both eyes: 1.0% Mydriacyl, 2.5% Phenylephrine @ 2:06 PM           Slit Lamp and Fundus Exam     External Exam       Right Left   External Normal Normal         Slit Lamp Exam       Right Left   Lids/Lashes Normal Normal   Conjunctiva/Sclera White and quiet White and quiet   Cornea Clear Clear   Anterior Chamber Deep and quiet Deep and quiet   Iris Round and reactive Round and reactive   Lens Posterior chamber intraocular lens Posterior chamber intraocular lens   Anterior Vitreous Normal Normal         Fundus Exam       Right Left   Posterior Vitreous Posterior vitreous detachment Posterior  vitreous detachment   Disc Normal Normal   C/D Ratio 0.55 0.55   Macula Geographic atrophy centrally, 6-7 DA size Disciform scar, Geographic atrophy near the FAZ, no exudates, Retinal pigment epithelial  mottling   Vessels Normal Normal   Periphery Normal Normal            IMAGING AND PROCEDURES  Imaging and Procedures for 10/17/21  OCT, Retina - OU - Both Eyes       Right Eye Quality was good. Scan locations included subfoveal. Central Foveal Thickness: 197. Progression has been stable. Findings include central retinal atrophy, inner retinal atrophy, outer retinal atrophy, abnormal foveal contour, retinal drusen , no IRF, no SRF.   Left Eye Quality was good. Scan locations included subfoveal. Central Foveal Thickness: 269. Progression has been stable. Findings include abnormal foveal contour, no SRF, disciform scar, subretinal scarring, central retinal atrophy.   Notes OD, no active CNVM, no OS with much less subretinal fluid CME as compared to January 2021.  Currently at 39-monthfollow-up left eye               ASSESSMENT/PLAN:  Exudative age-related macular degeneration of left eye with active choroidal neovascularization (HCC) Now no activity, at 372-monthollow-up today will observe today and follow-up in 3 months  Exudative age-related macular degeneration of right eye with inactive choroidal neovascularization (HCC) No sign of CNVM  Advanced nonexudative age-related macular degeneration of right eye with subfoveal involvement Atrophy accounts for acuity  Advanced nonexudative age-related macular degeneration of left eye without subfoveal involvement Atrophy creeping near the fovea, still with good acuity  Posterior vitreous detachment of right eye Physiologic OU     ICD-10-CM   1. Exudative age-related macular degeneration of left eye with active choroidal neovascularization (HCC)  H35.3221 OCT, Retina - OU - Both Eyes    Color Fundus Photography Optos - OU - Both Eyes    2. Advanced nonexudative age-related macular degeneration of right eye with subfoveal involvement  H35.3114 OCT, Retina - OU - Both Eyes    Color Fundus Photography Optos - OU - Both  Eyes    3. Exudative age-related macular degeneration of right eye with inactive choroidal neovascularization ([facility] H35.3212     4. Advanced nonexudative age-related macular degeneration of left eye without subfoveal involvement  H35.3123     5. Posterior vitreous detachment of right eye  H43.811       1.  No signs of active CNVM OU particularly OS continue to observe  2.  3.  Ophthalmic Meds Ordered this visit:  No orders of the defined types were placed in this encounter.      Return in about 3 months (around 01/15/2022) for DILATE OU, OCT, COLOR FP.  There are no Patient Instructions on file for this visit.   Explained the diagnoses, plan, and follow up with the patient and they expressed understanding.  Patient expressed understanding of the importance of proper follow up care.   GaClent Demarkankin M.D. Diseases & Surgery of the Retina and Vitreous [facility]2/12/22     Abbreviations: M myopia (nearsighted); A astigmatism; H hyperopia (farsighted); P presbyopia; Mrx spectacle prescription;  CTL contact lenses; OD right eye; OS left eye; OU both eyes  XT exotropia; ET esotropia; PEK punctate epithelial keratitis; PEE punctate epithelial erosions; DES dry eye syndrome; MGD meibomian gland dysfunction; ATs artificial tears; PFAT's preservative free artificial tears; NSBaldwinuclear sclerotic cataract; PSC posterior subcapsular cataract; ERM epi-retinal membrane;  PVD posterior vitreous detachment; RD retinal detachment; DM diabetes mellitus; DR diabetic retinopathy; NPDR non-proliferative diabetic retinopathy; PDR proliferative diabetic retinopathy; CSME clinically significant macular edema; DME diabetic macular edema; dbh dot blot hemorrhages; CWS cotton wool spot; POAG primary open angle glaucoma; C/D cup-to-disc ratio; HVF humphrey visual field; GVF goldmann visual field; OCT optical coherence tomography; IOP intraocular pressure; BRVO Branch retinal vein  occlusion; CRVO central retinal vein occlusion; CRAO central retinal artery occlusion; BRAO branch retinal artery occlusion; RT retinal tear; SB scleral buckle; PPV pars plana vitrectomy; VH Vitreous hemorrhage; PRP panretinal laser photocoagulation; IVK intravitreal kenalog; VMT vitreomacular traction; MH Macular hole;  NVD neovascularization of the disc; NVE neovascularization elsewhere; AREDS age related eye disease study; ARMD age related macular degeneration; POAG primary open angle glaucoma; EBMD epithelial/anterior basement membrane dystrophy; ACIOL anterior chamber intraocular lens; IOL intraocular lens; PCIOL posterior chamber intraocular lens; Phaco/IOL phacoemulsification with intraocular lens placement; Fidelity photorefractive keratectomy; LASIK laser assisted in situ keratomileusis; HTN hypertension; DM diabetes mellitus; COPD chronic obstructive pulmonary disease

## 2021-10-17 NOTE — Assessment & Plan Note (Signed)
Atrophy creeping near the fovea, still with good acuity

## 2021-10-17 NOTE — Assessment & Plan Note (Signed)
Atrophy accounts for acuity

## 2021-10-18 DIAGNOSIS — M5136 Other intervertebral disc degeneration, lumbar region: Secondary | ICD-10-CM | POA: Diagnosis not present

## 2021-10-18 DIAGNOSIS — M41125 Adolescent idiopathic scoliosis, thoracolumbar region: Secondary | ICD-10-CM | POA: Diagnosis not present

## 2021-10-18 DIAGNOSIS — M9903 Segmental and somatic dysfunction of lumbar region: Secondary | ICD-10-CM | POA: Diagnosis not present

## 2021-10-18 DIAGNOSIS — M62830 Muscle spasm of back: Secondary | ICD-10-CM | POA: Diagnosis not present

## 2021-10-25 DIAGNOSIS — M62830 Muscle spasm of back: Secondary | ICD-10-CM | POA: Diagnosis not present

## 2021-10-25 DIAGNOSIS — M5136 Other intervertebral disc degeneration, lumbar region: Secondary | ICD-10-CM | POA: Diagnosis not present

## 2021-10-25 DIAGNOSIS — M9903 Segmental and somatic dysfunction of lumbar region: Secondary | ICD-10-CM | POA: Diagnosis not present

## 2021-10-25 DIAGNOSIS — M41125 Adolescent idiopathic scoliosis, thoracolumbar region: Secondary | ICD-10-CM | POA: Diagnosis not present

## 2021-11-01 DIAGNOSIS — M5136 Other intervertebral disc degeneration, lumbar region: Secondary | ICD-10-CM | POA: Diagnosis not present

## 2021-11-01 DIAGNOSIS — M62830 Muscle spasm of back: Secondary | ICD-10-CM | POA: Diagnosis not present

## 2021-11-01 DIAGNOSIS — M41125 Adolescent idiopathic scoliosis, thoracolumbar region: Secondary | ICD-10-CM | POA: Diagnosis not present

## 2021-11-01 DIAGNOSIS — M9903 Segmental and somatic dysfunction of lumbar region: Secondary | ICD-10-CM | POA: Diagnosis not present

## 2021-11-03 ENCOUNTER — Telehealth: Payer: Self-pay | Admitting: Family Medicine

## 2021-11-03 DIAGNOSIS — M810 Age-related osteoporosis without current pathological fracture: Secondary | ICD-10-CM

## 2021-11-03 DIAGNOSIS — I10 Essential (primary) hypertension: Secondary | ICD-10-CM

## 2021-11-03 DIAGNOSIS — E785 Hyperlipidemia, unspecified: Secondary | ICD-10-CM

## 2021-11-03 DIAGNOSIS — E538 Deficiency of other specified B group vitamins: Secondary | ICD-10-CM

## 2021-11-03 NOTE — Telephone Encounter (Signed)
Patient has a lab appt 11/10/2020, there are no orders in.

## 2021-11-07 NOTE — Telephone Encounter (Signed)
Done. Thanks.

## 2021-11-07 NOTE — Addendum Note (Signed)
Addended by: Tonia Ghent on: 11/07/2021 12:23 PM   Modules accepted: Orders

## 2021-11-08 DIAGNOSIS — M41125 Adolescent idiopathic scoliosis, thoracolumbar region: Secondary | ICD-10-CM | POA: Diagnosis not present

## 2021-11-08 DIAGNOSIS — M955 Acquired deformity of pelvis: Secondary | ICD-10-CM | POA: Diagnosis not present

## 2021-11-08 DIAGNOSIS — M9905 Segmental and somatic dysfunction of pelvic region: Secondary | ICD-10-CM | POA: Diagnosis not present

## 2021-11-08 DIAGNOSIS — M9903 Segmental and somatic dysfunction of lumbar region: Secondary | ICD-10-CM | POA: Diagnosis not present

## 2021-11-09 NOTE — Progress Notes (Signed)
Subjective:   Susan [name] is a 76 y.o. female who presents for Medicare Annual (Subsequent) preventive examination.  I connected with Yuki Brunsman today by telephone and verified that I am speaking with the correct person using two identifiers. Location patient: home Location provider: work Persons participating in the virtual visit: patient, Marine scientist.    I discussed the limitations, risks, security and privacy concerns of performing an evaluation and management service by telephone and the availability of in person appointments. I also discussed with the patient that there may be a patient responsible charge related to this service. The patient expressed understanding and verbally consented to this telephonic visit.    Interactive audio and video telecommunications were attempted between this provider and patient, however failed, due to patient having technical difficulties OR patient did not have access to video capability.  We continued and completed visit with audio only.  Some vital signs may be absent or patient reported.   Time Spent with patient on telephone encounter: 25 minutes  Review of Systems     Cardiac Risk Factors include: advanced age (>36mn, >>78women);hypertension;dyslipidemia     Objective:    Today's Vitals   11/11/21 1313  Weight: 138 lb (62.6 kg)  Height: 5' 1"  (1.549 m)  PainSc: 2    Body mass index is 26.07 kg/m.  Advanced Directives 11/11/2021 01/01/2021 11/14/2020 11/13/2020 04/23/2019 01/25/2018 07/13/2016  Does Patient Have a Medical Advance Directive? Yes No Yes Yes Yes Yes No  Type of [place] will - [place] will - [place] will [place] will -  Does patient want to make changes to medical advance directive? Yes (MAU/Ambulatory/Procedural Areas - Information given) - No - Patient declined - No - Patient declined - -  Copy  of [place] Chart? - - No - copy requested - No - copy requested No - copy requested -  Would patient like information on creating a medical advance directive? - - - - - - Yes - Educational materials given    Current Medications (verified) Outpatient Encounter Medications as of 11/11/2021  Medication Sig   acetaminophen (TYLENOL) 500 MG tablet Take 1,000 mg by mouth 2 (two) times daily.   aspirin 81 MG tablet Take 81 mg by mouth daily.   baclofen (LIORESAL) 10 MG tablet Take 1 tablet (10 mg total) by mouth 2 (two) times daily as needed for muscle spasms.   Cholecalciferol 1000 units tablet Take 2 tablets (2,000 Units total) by mouth daily.   ezetimibe (ZETIA) 10 MG tablet TAKE 1 TABLET BY MOUTH DAILY.   lisinopril-hydrochlorothiazide (ZESTORETIC) 20-25 MG tablet TAKE 1 TABLET EVERY DAY   vitamin B-12 (CYANOCOBALAMIN) 1000 MCG tablet Take 1,000 mcg by mouth daily.   No facility-administered encounter medications on file as of 11/11/2021.    Allergies (verified) Lipitor [atorvastatin calcium], Penicillins, and Sulfonamide derivatives   History: Past Medical History:  Diagnosis Date   Colon cancer screening    Diverticulosis of colon    Exudative age-related macular degeneration of right eye with active choroidal neovascularization ([facility] 03/23/2020   Hyperlipidemia    Hypertension    IBS (irritable bowel syndrome)    Leaky heart valve 10/07/2015   Macular degeneration    Migraine, unspecified, without mention of intractable migraine without mention of status migrainosus    NASH (nonalcoholic steatohepatitis)    Nonobstructive atherosclerosis of coronary artery 2016   Cardiac CTA with  mild proximal LAD disease; CAC score 74.   Ocular migraine    Osteoporosis    on Fosamax for 5 years   Other abnormal glucose    Other chronic nonalcoholic liver disease    LFTs normalized with weight loss 2013   Other screening mammogram    Symptomatic menopausal or female  climacteric states    Thyroid disease    Tubular adenoma of colon    Urinary tract infection, site not specified    Past Surgical History:  Procedure Laterality Date   abdominal ultrasound  04/12/2004 & 6/06   positive gallstones   ANKLE FRACTURE SURGERY  ~2002   pinning   BTL  30+ years ago   CATARACT EXTRACTION  2013   B eyes   COLONOSCOPY  06/08/06   Polyps, divertics (Dr. Sharlett Iles)   CT abdomen  04/29/04   Positive gallstones   INTRAMEDULLARY (IM) NAIL INTERTROCHANTERIC Left 11/14/2020   Procedure: INTRAMEDULLARY (IM) NAIL INTERTROCHANTRIC;  Surgeon: Earnestine Leys, MD;  Location: [facility] ORS;  Service: Orthopedics;  Laterality: Left;   TONSILLECTOMY AND ADENOIDECTOMY  Age 20   ultrasound pelvis  6/06   Negative   Family History  Problem Relation Age of Onset   Depression Mother        Manic depression   Diabetes Mother        Type 2   Breast cancer Mother    Heart disease Mother    Colon polyps Father    Heart attack Father 85   Breast cancer Maternal Aunt    Breast cancer Paternal Aunt    Crohn's disease Brother    Osteoporosis Brother    Cancer Neg Hx    Colon cancer Neg Hx    Social History   Socioeconomic History   Marital status: Married    Spouse name: Not on file   Number of children: 2   Years of education: 13   Highest education level: Not on file  Occupational History   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 Lives with husband, Enjoys  times with 5 grandkids, 1 great grandchild   Social Determinants of Radio broadcast assistant Strain: Low Risk    Difficulty of Paying Living Expenses: Not very hard  Food Insecurity: No Food Insecurity   Worried About Charity fundraiser in the Last Year: Never true   Arboriculturist in the Last Year: Never true  Transportation Needs: No Transportation Needs   Lack of Transportation (Medical): No   Lack of Transportation (Non-Medical): No  Physical Activity: Insufficiently Active   Days of Exercise per Week: 7 days   Minutes of Exercise per Session: 20 min  Stress: No Stress Concern Present   Feeling of Stress : Not at all  Social Connections: Moderately Isolated   Frequency of Communication  with Friends and Family: More than three times a week   Frequency of Social Gatherings with Friends and Family: Three times a week   Attends Religious Services: Never   Active Member of Clubs or Organizations: No   Attends Archivist Meetings: Never   Marital Status: Married    Tobacco Counseling Counseling given: Not Answered Tobacco comments: Quit in 2001   Clinical Intake:  Pre-visit preparation completed: Yes  Pain : 0-10 Pain Score: 2  Pain Location: Hip Pain Orientation: Left     BMI - recorded: 26.07 Nutritional Status: BMI 25 -29 Overweight Nutritional Risks: None Diabetes: No  How often do you need to have someone help you when you read instructions, pamphlets, or other written materials from your doctor or pharmacy?: 1 - Never  Diabetic? No  Interpreter Needed?: No  Information entered by :: Orrin Brigham LPN   Activities of Daily Living In your present state of health, do you have any difficulty performing the following activities: 11/11/2021 11/14/2020  Hearing? Y N  Vision? N Y  Comment - macular degeneration  Difficulty concentrating or making decisions? N N  Walking or climbing stairs? N Y  Dressing or bathing? N Y  Doing errands, shopping? Y  N  Comment husband drives to appointments -  Preparing Food and eating ? N -  Using the Toilet? N -  In the past six months, have you accidently leaked urine? Y -  Comment difficulty holding -  Do you have problems with loss of bowel control? Y -  Comment difficulty holding -  Managing your Medications? N -  Managing your Finances? N -  Housekeeping or managing your Housekeeping? N -  Some recent data might be hidden    Patient Care Team: Tonia Ghent, MD as PCP - General End, Harrell Gave, MD as PCP - Cardiology (Cardiology) Celesta Gentile, MD as Consulting Physician (Cardiology) Lorelee Cover., MD as Referring Physician (Ophthalmology) Zadie Rhine Clent Demark, MD as Consulting Physician (Ophthalmology)  Indicate any recent Medical Services you may have received from other than [facility] providers in the past year (date may be approximate).     Assessment:   This is a routine wellness examination for Tityana.  Hearing/Vision screen Hearing Screening - Comments:: Patient states decrease in hearing with additional noises Vision Screening - Comments:: Last exam 3 years ago, plans to make an appointment , Dr Gloriann Loan  Dietary issues and exercise activities discussed: Current Exercise Habits: The patient does not participate in regular exercise at present;Home exercise routine, Type of exercise: stretching;strength training/weights;walking, Time (Minutes): 15, Frequency (Times/Week): 7, Weekly Exercise (Minutes/Week): 105, Intensity: Mild, Exercise limited by: orthopedic condition(s)   Goals Addressed             This Visit's Progress    Patient Stated       Would like to lose more weight       Depression Screen PHQ 2/9 Scores 11/11/2021 05/30/2021 04/23/2019 01/25/2018 07/13/2016 03/22/2015 08/25/2013  PHQ - 2 Score 1 0 0 0 0 0 0  PHQ- 9 Score - 0 0 1 - - -    Fall Risk Fall Risk  11/11/2021 05/30/2021 04/23/2019 01/25/2018 03/19/2017  Falls in the past year? 0 1 0 No No  Number falls in past  yr: 0 0 - - -  Injury with Fall? 0 1 - - -  Risk for fall due to : Orthopedic patient - - - -  Follow up Falls prevention discussed - - - -  FALL RISK PREVENTION PERTAINING TO THE HOME:  Any stairs in or around the home? No  If so, are there any without handrails? No  Home free of loose throw rugs in walkways, pet beds, electrical cords, etc? Yes  Adequate lighting in your home to reduce risk of falls? Yes   ASSISTIVE DEVICES UTILIZED TO PREVENT FALLS:  Life alert? No  Use of a cane, walker or w/c? Yes , cane Grab bars in the bathroom? Yes  Shower chair or bench in shower? Yes  Elevated toilet seat or a handicapped toilet? Yes   TIMED UP AND GO:  Was the test performed? No .    Cognitive Function: Normal cognitive status assessed by this Nurse Health Advisor. No abnormalities found.   MMSE - Mini Mental State Exam 04/23/2019 01/25/2018 07/13/2016  Orientation to time 5 5 5   Orientation to Place 5 5 5   Registration 3 3 3   Attention/ Calculation 0 0 0  Recall 3 2 3   Recall-comments - unable to recall 1 of 3 words -  Language- name 2 objects 0 0 0  Language- repeat 1 1 1   Language- follow 3 step command 0 3 3  Language- read & follow direction 0 0 0  Write a sentence 0 0 0  Copy design 0 0 0  Total score 17 19 20         Immunizations Immunization History  Administered Date(s) Administered   Influenza Whole 08/12/2010   Influenza, Seasonal, Injecte, Preservative Fre 12/13/2012   Influenza,inj,Quad PF,6+ Mos 08/25/2013, 09/03/2014, 07/13/2016, 12/13/2017   PFIZER(Purple Top)SARS-COV-2 Vaccination 12/29/2019, 01/19/2020, 09/20/2020   Pneumococcal Conjugate-13 03/22/2015   Pneumococcal Polysaccharide-23 08/12/2010   Td 11/06/1998, 08/12/2010    TDAP status: Due, Education has been provided regarding the importance of this vaccine. Advised may receive this vaccine at local pharmacy or Health Dept. Aware to provide a copy of the vaccination record if obtained from  local pharmacy or Health Dept. Verbalized acceptance and understanding.  Flu Vaccine status: Due, Education has been provided regarding the importance of this vaccine. Advised may receive this vaccine at local pharmacy or Health Dept. Aware to provide a copy of the vaccination record if obtained from local pharmacy or Health Dept. Verbalized acceptance and understanding.  Pneumococcal vaccine status: Up to date  Covid-19 vaccine status: Information provided on how to obtain vaccines.   Qualifies for Shingles Vaccine? Yes   Zostavax completed No   Shingrix Completed?: No.    Education has been provided regarding the importance of this vaccine. Patient has been advised to call insurance company to determine out of pocket expense if they have not yet received this vaccine. Advised may also receive vaccine at local pharmacy or Health Dept. Verbalized acceptance and understanding.  Screening Tests Health Maintenance  Topic Date Due   Zoster Vaccines- Shingrix (1 of 2) Never done   TETANUS/TDAP  08/12/2020   COVID-19 Vaccine (4 - Booster for Pfizer series) 11/15/2020   COLONOSCOPY (Pts 45-48yr Insurance coverage will need to be confirmed)  03/22/2021   INFLUENZA VACCINE  06/06/2021   Pneumonia Vaccine 77 Years old  Completed   DEXA SCAN  Completed   Hepatitis C Screening  Completed   HPV VACCINES  Aged Out    Health Maintenance  Health Maintenance Due  Topic Date Due   Zoster Vaccines- Shingrix (1 of 2) Never done   TETANUS/TDAP  08/12/2020   COVID-19 Vaccine (4 - Booster for PKenneyseries) 11/15/2020   COLONOSCOPY (Pts 45-436yrInsurance  coverage will need to be confirmed)  03/22/2021   INFLUENZA VACCINE  06/06/2021    Colorectal cancer screening: Type of screening: Colonoscopy. Completed 03/22/16. Repeat every 5 years  Mammogram status: Completed 06/24/21. Repeat every year  Bone Density status: Completed 06/09/21. Results reflect: Bone density results: OSTEOPOROSIS. Repeat every 2  years.  Lung Cancer Screening: (Low Dose CT Chest recommended if Age 77-80 years, 30 pack-year currently smoking OR have quit w/in 15years.) does not qualify.     Additional Screening:  Hepatitis C Screening: does qualify; Completed 07/13/16  Vision Screening: Recommended annual ophthalmology exams for early detection of glaucoma and other disorders of the eye. Is the patient up to date with their annual eye exam?  No , patient plans on making an appointment. Who is the provider or what is the name of the office in which the patient attends annual eye exams? Dr. Gloriann Loan   Dental Screening: Recommended annual dental exams for proper oral hygiene  Community Resource Referral / Chronic Care Management: CRR required this visit?  No   CCM required this visit?  No      Plan:     I have personally reviewed and noted the following in the patients chart:   Medical and social history Use of alcohol, tobacco or illicit drugs  Current medications and supplements including opioid prescriptions.  Functional ability and status Nutritional status Physical activity Advanced directives List of other physicians Hospitalizations, surgeries, and ER visits in previous 12 months Vitals Screenings to include cognitive, depression, and falls Referrals and appointments  In addition, I have reviewed and discussed with patient certain preventive protocols, quality metrics, and best practice recommendations. A written personalized care plan for preventive services as well as general preventive health recommendations were provided to patient.   Due to this being a telephonic visit, the after visit summary with patients personalized plan was offered to patient via mail or my-chart.  Patient would like to access on my-chart.    Loma Messing, LPN   07/10/5858   Nurse Health Advisor  Nurse Notes: none

## 2021-11-10 ENCOUNTER — Other Ambulatory Visit (INDEPENDENT_AMBULATORY_CARE_PROVIDER_SITE_OTHER): Payer: Medicare HMO

## 2021-11-10 ENCOUNTER — Other Ambulatory Visit: Payer: Self-pay

## 2021-11-10 ENCOUNTER — Other Ambulatory Visit: Payer: Medicare HMO

## 2021-11-10 DIAGNOSIS — E785 Hyperlipidemia, unspecified: Secondary | ICD-10-CM

## 2021-11-10 DIAGNOSIS — I10 Essential (primary) hypertension: Secondary | ICD-10-CM

## 2021-11-10 DIAGNOSIS — E538 Deficiency of other specified B group vitamins: Secondary | ICD-10-CM

## 2021-11-10 DIAGNOSIS — M810 Age-related osteoporosis without current pathological fracture: Secondary | ICD-10-CM

## 2021-11-11 ENCOUNTER — Ambulatory Visit (INDEPENDENT_AMBULATORY_CARE_PROVIDER_SITE_OTHER): Payer: Medicare HMO

## 2021-11-11 VITALS — Ht 61.0 in | Wt 138.0 lb

## 2021-11-11 DIAGNOSIS — Z0000 Encounter for general adult medical examination without abnormal findings: Secondary | ICD-10-CM | POA: Diagnosis not present

## 2021-11-11 LAB — CBC WITH DIFFERENTIAL/PLATELET
Basophils Absolute: 0.1 10*3/uL (ref 0.0–0.1)
Basophils Relative: 1.2 % (ref 0.0–3.0)
Eosinophils Absolute: 0.2 10*3/uL (ref 0.0–0.7)
Eosinophils Relative: 3.2 % (ref 0.0–5.0)
HCT: 38.4 % (ref 36.0–46.0)
Hemoglobin: 12.8 g/dL (ref 12.0–15.0)
Lymphocytes Relative: 36.7 % (ref 12.0–46.0)
Lymphs Abs: 2.2 10*3/uL (ref 0.7–4.0)
MCHC: 33.4 g/dL (ref 30.0–36.0)
MCV: 92.4 fl (ref 78.0–100.0)
Monocytes Absolute: 0.4 10*3/uL (ref 0.1–1.0)
Monocytes Relative: 7.2 % (ref 3.0–12.0)
Neutro Abs: 3.1 10*3/uL (ref 1.4–7.7)
Neutrophils Relative %: 51.7 % (ref 43.0–77.0)
Platelets: 198 10*3/uL (ref 150.0–400.0)
RBC: 4.16 Mil/uL (ref 3.87–5.11)
RDW: 12.7 % (ref 11.5–15.5)
WBC: 6 10*3/uL (ref 4.0–10.5)

## 2021-11-11 LAB — COMPREHENSIVE METABOLIC PANEL
ALT: 16 U/L (ref 0–35)
AST: 22 U/L (ref 0–37)
Albumin: 4.4 g/dL (ref 3.5–5.2)
Alkaline Phosphatase: 53 U/L (ref 39–117)
BUN: 26 mg/dL — ABNORMAL HIGH (ref 6–23)
CO2: 29 mEq/L (ref 19–32)
Calcium: 9.6 mg/dL (ref 8.4–10.5)
Chloride: 103 mEq/L (ref 96–112)
Creatinine, Ser: 0.64 mg/dL (ref 0.40–1.20)
GFR: 85.87 mL/min (ref >60.00)
Glucose, Bld: 95 mg/dL (ref 70–99)
Potassium: 3.8 mEq/L (ref 3.5–5.1)
Sodium: 140 mEq/L (ref 135–145)
Total Bilirubin: 0.5 mg/dL (ref 0.2–1.2)
Total Protein: 7.2 g/dL (ref 6.0–8.3)

## 2021-11-11 LAB — TSH: TSH: 2.16 u[IU]/mL (ref 0.35–5.50)

## 2021-11-11 LAB — LIPID PANEL
Cholesterol: 160 mg/dL (ref 0–200)
HDL: 51.8 mg/dL (ref >39.00)
LDL Cholesterol: 86 mg/dL (ref 0–99)
NonHDL: 108.64
Total CHOL/HDL Ratio: 3
Triglycerides: 112 mg/dL (ref 0.0–149.0)
VLDL: 22.4 mg/dL (ref 0.0–40.0)

## 2021-11-11 LAB — VITAMIN B12: Vitamin B-12: 615 pg/mL (ref 211–911)

## 2021-11-11 LAB — VITAMIN D 25 HYDROXY (VIT D DEFICIENCY, FRACTURES): VITD: 28.93 ng/mL — ABNORMAL LOW (ref 30.00–100.00)

## 2021-11-11 NOTE — Patient Instructions (Signed)
Susan [name] , Thank you for taking time to complete your Medicare Wellness Visit. I appreciate your ongoing commitment to your health goals. Please review the following plan we discussed and let me know if I can assist you in the future.   Screening recommendations/referrals: Colonoscopy: due, last completed 03/22/16, due 03/22/21 Mammogram: up to date, completed 06/24/21, due 06/24/22 Bone Density: up to date, completed 06/09/21, due 06/10/23 Recommended yearly ophthalmology/optometry visit for glaucoma screening and checkup Recommended yearly dental visit for hygiene and checkup  Vaccinations: Influenza vaccine: Due-May obtain vaccine at our office or your local pharmacy. Pneumococcal vaccine: up to date Tdap vaccine: Due-last completed 08/12/10 May obtain vaccine at your local pharmacy. Shingles vaccine:  May obtain vaccine at your local pharmacy.   Covid-19:newest booster available at your local pharmacy  Advanced directives: information available at your next appointment  Conditions/risks identified: see problem list  Next appointment: Follow up in one year for your annual wellness visit    Preventive Care 65 Years and Older, Female Preventive care refers to lifestyle choices and visits with your health care provider that can promote health and wellness. What does preventive care include? A yearly physical exam. This is also called an annual well check. Dental exams once or twice a year. Routine eye exams. Ask your health care provider how often you should have your eyes checked. Personal lifestyle choices, including: Daily care of your teeth and gums. Regular physical activity. Eating a healthy diet. Avoiding tobacco and drug use. Limiting alcohol use. Practicing safe sex. Taking low-dose aspirin every day. Taking vitamin and mineral supplements as recommended by your health care provider. What happens during an annual well check? The services and screenings done by your health  care provider during your annual well check will depend on your age, overall health, lifestyle risk factors, and family history of disease. Counseling  Your health care provider may ask you questions about your: Alcohol use. Tobacco use. Drug use. Emotional well-being. Home and relationship well-being. Sexual activity. Eating habits. History of falls. Memory and ability to understand (cognition). Work and work Statistician. Reproductive health. Screening  You may have the following tests or measurements: Height, weight, and BMI. Blood pressure. Lipid and cholesterol levels. These may be checked every 5 years, or more frequently if you are over 51 years old. Skin check. Lung cancer screening. You may have this screening every year starting at age 35 if you have a 30-pack-year history of smoking and currently smoke or have quit within the past 15 years. Fecal occult blood test (FOBT) of the stool. You may have this test every year starting at age 83. Flexible sigmoidoscopy or colonoscopy. You may have a sigmoidoscopy every 5 years or a colonoscopy every 10 years starting at age 82. Hepatitis C blood test. Hepatitis B blood test. Sexually transmitted disease (STD) testing. Diabetes screening. This is done by checking your blood sugar (glucose) after you have not eaten for a while (fasting). You may have this done every 1-3 years. Bone density scan. This is done to screen for osteoporosis. You may have this done starting at age 29. Mammogram. This may be done every 1-2 years. Talk to your health care provider about how often you should have regular mammograms. Talk with your health care provider about your test results, treatment options, and if necessary, the need for more tests. Vaccines  Your health care provider may recommend certain vaccines, such as: Influenza vaccine. This is recommended every year. Tetanus, diphtheria, and acellular pertussis (  Tdap, Td) vaccine. You may need a Td  booster every 10 years. Zoster vaccine. You may need this after age 37. Pneumococcal 13-valent conjugate (PCV13) vaccine. One dose is recommended after age 34. Pneumococcal polysaccharide (PPSV23) vaccine. One dose is recommended after age 21. Talk to your health care provider about which screenings and vaccines you need and how often you need them. This information is not intended to replace advice given to you by your health care provider. Make sure you discuss any questions you have with your health care provider. Document Released: 11/19/2015 Document Revised: 07/12/2016 Document Reviewed: 08/24/2015 Elsevier Interactive Patient Education  2017 [employer] Prevention in the Home Falls can cause injuries. They can happen to people of all ages. There are many things you can do to make your home safe and to help prevent falls. What can I do on the outside of my home? Regularly fix the edges of walkways and driveways and fix any cracks. Remove anything that might make you trip as you walk through a door, such as a raised step or threshold. Trim any bushes or trees on the path to your home. Use bright outdoor lighting. Clear any walking paths of anything that might make someone trip, such as rocks or tools. Regularly check to see if handrails are loose or broken. Make sure that both sides of any steps have handrails. Any raised decks and porches should have guardrails on the edges. Have any leaves, snow, or ice cleared regularly. Use sand or salt on walking paths during winter. Clean up any spills in your garage right away. This includes oil or grease spills. What can I do in the bathroom? Use night lights. Install grab bars by the toilet and in the tub and shower. Do not use towel bars as grab bars. Use non-skid mats or decals in the tub or shower. If you need to sit down in the shower, use a plastic, non-slip stool. Keep the floor dry. Clean up any water that spills on the floor  as soon as it happens. Remove soap buildup in the tub or shower regularly. Attach bath mats securely with double-sided non-slip rug tape. Do not have throw rugs and other things on the floor that can make you trip. What can I do in the bedroom? Use night lights. Make sure that you have a light by your bed that is easy to reach. Do not use any sheets or blankets that are too big for your bed. They should not hang down onto the floor. Have a firm chair that has side arms. You can use this for support while you get dressed. Do not have throw rugs and other things on the floor that can make you trip. What can I do in the kitchen? Clean up any spills right away. Avoid walking on wet floors. Keep items that you use a lot in easy-to-reach places. If you need to reach something above you, use a strong step stool that has a grab bar. Keep electrical cords out of the way. Do not use floor polish or wax that makes floors slippery. If you must use wax, use non-skid floor wax. Do not have throw rugs and other things on the floor that can make you trip. What can I do with my stairs? Do not leave any items on the stairs. Make sure that there are handrails on both sides of the stairs and use them. Fix handrails that are broken or loose. Make sure that handrails are  as long as the stairways. Check any carpeting to make sure that it is firmly attached to the stairs. Fix any carpet that is loose or worn. Avoid having throw rugs at the top or bottom of the stairs. If you do have throw rugs, attach them to the floor with carpet tape. Make sure that you have a light switch at the top of the stairs and the bottom of the stairs. If you do not have them, ask someone to add them for you. What else can I do to help prevent falls? Wear shoes that: Do not have high heels. Have rubber bottoms. Are comfortable and fit you well. Are closed at the toe. Do not wear sandals. If you use a stepladder: Make sure that it is  fully opened. Do not climb a closed stepladder. Make sure that both sides of the stepladder are locked into place. Ask someone to hold it for you, if possible. Clearly mark and make sure that you can see: Any grab bars or handrails. First and last steps. Where the edge of each step is. Use tools that help you move around (mobility aids) if they are needed. These include: Canes. Walkers. Scooters. Crutches. Turn on the lights when you go into a dark area. Replace any light bulbs as soon as they burn out. Set up your furniture so you have a clear path. Avoid moving your furniture around. If any of your floors are uneven, fix them. If there are any pets around you, be aware of where they are. Review your medicines with your doctor. Some medicines can make you feel dizzy. This can increase your chance of falling. Ask your doctor what other things that you can do to help prevent falls. This information is not intended to replace advice given to you by your health care provider. Make sure you discuss any questions you have with your health care provider. Document Released: 08/19/2009 Document Revised: 03/30/2016 Document Reviewed: 11/27/2014 Elsevier Interactive Patient Education  2017 [employer].

## 2021-11-14 ENCOUNTER — Encounter: Payer: Self-pay | Admitting: Family Medicine

## 2021-11-14 ENCOUNTER — Ambulatory Visit (INDEPENDENT_AMBULATORY_CARE_PROVIDER_SITE_OTHER): Payer: Medicare HMO | Admitting: Family Medicine

## 2021-11-14 ENCOUNTER — Other Ambulatory Visit: Payer: Self-pay

## 2021-11-14 VITALS — BP 116/70 | HR 91 | Temp 97.7°F | Ht 61.0 in | Wt 138.0 lb

## 2021-11-14 DIAGNOSIS — M549 Dorsalgia, unspecified: Secondary | ICD-10-CM

## 2021-11-14 DIAGNOSIS — M810 Age-related osteoporosis without current pathological fracture: Secondary | ICD-10-CM

## 2021-11-14 DIAGNOSIS — E559 Vitamin D deficiency, unspecified: Secondary | ICD-10-CM

## 2021-11-14 DIAGNOSIS — E785 Hyperlipidemia, unspecified: Secondary | ICD-10-CM

## 2021-11-14 DIAGNOSIS — I10 Essential (primary) hypertension: Secondary | ICD-10-CM

## 2021-11-14 DIAGNOSIS — Z7189 Other specified counseling: Secondary | ICD-10-CM

## 2021-11-14 DIAGNOSIS — Z0000 Encounter for general adult medical examination without abnormal findings: Secondary | ICD-10-CM

## 2021-11-14 DIAGNOSIS — Z23 Encounter for immunization: Secondary | ICD-10-CM | POA: Diagnosis not present

## 2021-11-14 MED ORDER — BACLOFEN 10 MG PO TABS: 10.0000 mg | ORAL_TABLET | Freq: Two times a day (BID) | ORAL | 3 refills | Status: DC | PRN

## 2021-11-14 NOTE — Patient Instructions (Addendum)
Increase vitamin D to 2000 units a day a recheck vitamin D level in about 3 months. Please schedule a nonfasting lab visit.   Take care.  Glad to see you.

## 2021-11-14 NOTE — Assessment & Plan Note (Addendum)
Advance directive- oldest daughter Maudie Mercury designated if patient were incapacitated.

## 2021-11-14 NOTE — Progress Notes (Signed)
This visit occurred during the SARS-CoV-2 public health emergency.  Safety protocols were in place, including screening questions prior to the visit, additional usage of staff PPE, and extensive cleaning of exam room while observing appropriate contact time as indicated for disinfecting solutions.  Hypertension:    Using medication without problems or lightheadedness: yes Chest pain with exertion:no Edema:trace BLE Short of breath:no- she doesn't have exertional sx but has the sensation where she has the need to take a deep breath.  No pain with that.  She has cardiology follow-up pending.  Vaccines d/w pt.   Colonoscopy 2017- she got a letter from GI about follow up.  D/w pt.   Breast cancer screening- done 2022 Advance directive- oldest daughter Maudie Mercury designated if patient were incapacitated.   Osteoporosis hx.  Taking vit D at baseline.  S/p 5 years of fosamax. We prev talked about tx with prolia.  Minimally low vit D d/w pt.    She is going to f/u with endo and cardiology.    Elevated Cholesterol: Using medications without problems:yes Muscle aches: not from zetia.   Diet compliance: encouraged.   Exercise: as tolerated.    She went to PT about her back, but was still having pain.  Chiropractor manipulation helped. Less pain now.  Baclofen helps.  Taking 2 advil helps.  GI cautions d/w pt.     Meds, vitals, and allergies reviewed.   PMH and SH reviewed  ROS: Per HPI unless specifically indicated in ROS section   GEN: nad, alert and oriented HEENT: ncat NECK: supple w/o LA CV: rrr. SEM noted.  PULM: ctab, no inc wob ABD: soft, +bs EXT: trace BLE edema SKIN: well perfused.    30 minutes were devoted to patient care in this encounter (this includes time spent reviewing the patient's file/history, interviewing and examining the patient, counseling/reviewing plan with patient).

## 2021-11-15 DIAGNOSIS — M9903 Segmental and somatic dysfunction of lumbar region: Secondary | ICD-10-CM | POA: Diagnosis not present

## 2021-11-15 DIAGNOSIS — M9905 Segmental and somatic dysfunction of pelvic region: Secondary | ICD-10-CM | POA: Diagnosis not present

## 2021-11-15 DIAGNOSIS — M41125 Adolescent idiopathic scoliosis, thoracolumbar region: Secondary | ICD-10-CM | POA: Diagnosis not present

## 2021-11-15 DIAGNOSIS — M955 Acquired deformity of pelvis: Secondary | ICD-10-CM | POA: Diagnosis not present

## 2021-11-16 DIAGNOSIS — M549 Dorsalgia, unspecified: Secondary | ICD-10-CM | POA: Insufficient documentation

## 2021-11-16 NOTE — Assessment & Plan Note (Signed)
Vaccines d/w pt.   Colonoscopy 2017- she got a letter from GI about follow up.  D/w pt.   Breast cancer screening- done 2022 Advance directive- oldest daughter Maudie Mercury designated if patient were incapacitated.   Osteoporosis hx.  Taking vit D at baseline.  S/p 5 years of fosamax. We prev talked about tx with prolia.  Minimally low vit D d/w pt.

## 2021-11-16 NOTE — Assessment & Plan Note (Signed)
Taking vit D at baseline.  S/p 5 years of fosamax. We prev talked about tx with prolia.  Minimally low vit D d/w pt.   would replete vitamin D before doing anything else.  Increase vitamin D to 2000 units a day and recheck a vitamin D level in a few months.  See after visit summary.

## 2021-11-16 NOTE — Assessment & Plan Note (Signed)
Having less pain with chiropractic manipulation and baclofen use.  Continue baclofen.  Okay to use Advil if needed.  GI cautions given to patient.

## 2021-11-16 NOTE — Assessment & Plan Note (Signed)
She has cardiology follow-up pending.  Will await cardiology work-up.  Would continue lisinopril hydrochlorothiazide in the meantime.

## 2021-11-16 NOTE — Assessment & Plan Note (Signed)
Continue Zetia.

## 2021-11-29 DIAGNOSIS — M9905 Segmental and somatic dysfunction of pelvic region: Secondary | ICD-10-CM | POA: Diagnosis not present

## 2021-11-29 DIAGNOSIS — M41125 Adolescent idiopathic scoliosis, thoracolumbar region: Secondary | ICD-10-CM | POA: Diagnosis not present

## 2021-11-29 DIAGNOSIS — M9903 Segmental and somatic dysfunction of lumbar region: Secondary | ICD-10-CM | POA: Diagnosis not present

## 2021-11-29 DIAGNOSIS — M955 Acquired deformity of pelvis: Secondary | ICD-10-CM | POA: Diagnosis not present

## 2021-12-09 ENCOUNTER — Other Ambulatory Visit: Payer: Self-pay | Admitting: Internal Medicine

## 2021-12-09 DIAGNOSIS — I38 Endocarditis, valve unspecified: Secondary | ICD-10-CM

## 2021-12-09 DIAGNOSIS — I2510 Atherosclerotic heart disease of native coronary artery without angina pectoris: Secondary | ICD-10-CM

## 2021-12-16 ENCOUNTER — Telehealth: Payer: Self-pay | Admitting: Family Medicine

## 2021-12-16 NOTE — Chronic Care Management (AMB) (Signed)
°  Chronic Care Management   Note  12/16/2021 Name: [name] MRN: [id number] DOB: 1946-01-07  Susan [name] is a 76 y.o. year old female who is a primary care patient of Tonia Ghent, MD. I reached out to Kristin Bruins by phone today in response to a referral sent by [name]'s PCP, Tonia Ghent, MD.   [name] was given information about Chronic Care Management services today including:  CCM service includes personalized support from designated clinical staff supervised by her physician, including individualized plan of care and coordination with other care providers 24/7 contact phone numbers for assistance for urgent and routine care needs. Service will only be billed when office clinical staff spend 20 minutes or more in a month to coordinate care. Only one practitioner may furnish and bill the service in a calendar month. The patient may stop CCM services at any time (effective at the end of the month) by phone call to the office staff.   Patient agreed to services and verbal consent obtained.   Follow up plan:   Tatjana Secretary/administrator

## 2021-12-19 ENCOUNTER — Encounter: Payer: Self-pay | Admitting: Internal Medicine

## 2021-12-19 ENCOUNTER — Ambulatory Visit (INDEPENDENT_AMBULATORY_CARE_PROVIDER_SITE_OTHER): Payer: Medicare HMO | Admitting: Internal Medicine

## 2021-12-19 ENCOUNTER — Other Ambulatory Visit: Payer: Self-pay

## 2021-12-19 VITALS — BP 110/78 | HR 72 | Ht 61.0 in | Wt 135.6 lb

## 2021-12-19 DIAGNOSIS — E042 Nontoxic multinodular goiter: Secondary | ICD-10-CM | POA: Diagnosis not present

## 2021-12-19 NOTE — Progress Notes (Signed)
Patient ID: Susan [name], female   DOB: 1946-10-26, 76 y.o.   MRN: [id number]   This visit occurred during the SARS-CoV-2 public health emergency.  Safety protocols were in place, including screening questions prior to the visit, additional usage of staff PPE, and extensive cleaning of exam room while observing appropriate contact time as indicated for disinfecting solutions.   HPI  Susan [name] is a 76 y.o.-year-old female, initially referred by her PCP, Dr. Damita Dunnings, returning for follow-up thyroid nodules.  I saw the patient first in 2014 for an initial visit, then in 2015 and 2020, 2021.  Last visit 2 years ago.  Interim history: No neck compression symptoms since last visit. She has back pain -improving.  Walks with a cane.  Reviewed and addended previous investigation: She was diagnosed with hypothyroidism approximately 15 years ago >> she took levothyroxine for 5 years, then she started a homeopathic medicine and her TFTs normalized afterwards.  She did not restart levothyroxine afterwards.  She also remembers having had a thyroid Uptake and scan (ordered by Dr Ronnald Collum). Around that time, she was also told she had nodules and had Bx of the nodules >> benign:   We obtained the records from Dr Ronnald Collum: - pt not seen in his office since 2005: - 07/10/1997: FNA (site not specified): colloid nodule - 12/22/1998: FNA (sorce: left thyroid, increasind size of nodule): colloid nodule - 07/09/2001: FNA (source: left thyroid; 4x4 cm large mass in RLL - ?, increasing in size): hyperplastic nodule  Thyroid U/S (08/04/2013): 2 large thyroid nodules: 3.1 x 2.8 x 3 cm in the upper left lobe, 4.1 x 3.8 x 4.6 cm in the lower left lobe  FNA of the 2 left nodules (09/02/2013): Benign  Neck CT (10/22/2018): Right lobe of the thyroid surgically absent or severely atrophic, marked enlargement of the left lobe extending into the anterior superior mediastinum consistent with intrathoracic goiter.   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  Thyroid U/S (01/08/2020): Parenchymal Echotexture: Markedly heterogenous Isthmus: 0.1 cm Right lobe: 2.2 x 0.6 x 0.5 cm Left lobe: 6.8 x 3.5 x 3.6 cm _________________________________________________________  Nodule # 1: No significant interval change in the size, features or morphology of the solid isoechoic nodule in the more superior aspect of the left gland. The nodule measures 3.3 x 3.1 x 2.2 cm compared to 3.1 x 3.0 x 2.8 cm previously. Greater than 5 year stability is consistent with benignity. _________________________________________________________  Nodule # 2: No significant interval change in the size, features or morphology of the solid isoechoic nodule in the more inferior aspect of the left gland. The nodule measures 4.3 x 3.7 x 3.8 cm compared to 4.1 x 3.8 x 4.7 cm previously. Of note, the nodule is difficult to measure given its very inferior position. _________________________________________________________  IMPRESSION: No significant interval change in the size, features or morphology of the previously biopsied nodules in the left superior and left inferior thyroid gland compared to prior imaging from September of 2014.  Greater than 5 year stability is consistent with benignity. No further follow-up required.  Pt denies: - feeling nodules in neck - dysphagia - choking  I reviewed patient's TFTs: Lab Results  Component Value Date   TSH 2.16 11/10/2021   TSH 3.51 05/30/2021   TSH 2.280 06/09/2020   TSH 4.07 04/25/2019   TSH 3.940 10/17/2018   TSH 3.06 01/25/2018   TSH 2.30 02/26/2017   TSH 4.34 07/13/2016   TSH 2.36 03/18/2015   TSH 2.69 07/09/2014  FREET4 1.11 10/17/2018    + FH of thyroid ds.: mother - hypothyroidism. No FH of thyroid cancer. No h/o radiation tx to head or neck. No herbal supplements. No Biotin use. No recent steroids use.   She also has a history of HTN, HL, IBS, OP. Also, macular  degeneration >> getting IO injections. She had a femoral fracture 11/2020. On the lumbar spine from 12/2020  - also had a T12 vertebral fx. Saw a chiropractor (Dr. Francisca December in [place]) >> tremendous help.  ROS: + fatigue + see HPI  I reviewed pt's medications, allergies, PMH, social hx, family hx, and changes were documented in the history of present illness. Otherwise, unchanged from my initial visit note.  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   Ocular migraine    Osteoporosis    on Fosamax for 5 years   Other abnormal glucose    Other chronic nonalcoholic liver disease    LFTs normalized with weight loss 2013   Other screening mammogram    Symptomatic menopausal or female climacteric states    Thyroid disease    Tubular adenoma of colon    Urinary tract infection, site not specified    Past Surgical History:  Procedure Laterality Date   abdominal ultrasound  04/12/2004 & 6/06   positive gallstones   ANKLE FRACTURE SURGERY  ~2002   pinning   BTL  30+ years ago   CATARACT EXTRACTION  2013   B eyes   COLONOSCOPY  06/08/06   Polyps, divertics (Dr. 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 Lives with husband, Enjoys times with 5 grandkids, 1 great grandchild   Current Outpatient Medications on File Prior to Visit  Medication Sig Dispense Refill   acetaminophen (TYLENOL) 500 MG tablet Take 1,000 mg by mouth 2 (two) times daily.     aspirin 81 MG tablet  Take 81 mg by mouth daily.     baclofen (LIORESAL) 10 MG tablet Take 1 tablet (10 mg total) by mouth 2 (two) times daily as needed for muscle spasms. 60 each 3   Cholecalciferol 1000 units tablet Take 2 tablets (2,000 Units total) by mouth daily.     ezetimibe (ZETIA) 10 MG tablet TAKE 1 TABLET BY MOUTH DAILY. 90 tablet 0   lisinopril-hydrochlorothiazide (ZESTORETIC) 20-25 MG tablet TAKE 1 TABLET EVERY DAY 90 tablet 0   vitamin B-12 (CYANOCOBALAMIN) 1000 MCG tablet Take 1,000 mcg by mouth daily.     No current facility-administered medications on file prior to visit.   Allergies  Allergen Reactions   Lipitor [Atorvastatin Calcium] Other (See Comments)    Aches on med at 45m a day.    Penicillins Swelling and Rash    Rash and "swelling" in high school. Tolerated cefazolin 11/14/2020    Sulfonamide Derivatives Rash   Family History  Problem Relation Age of Onset   Depression Mother        Manic depression   Diabetes Mother         Type 2   Breast cancer Mother    Heart disease Mother    Colon polyps Father    Heart attack Father [phone]  Breast cancer Maternal Aunt    Breast cancer Paternal Aunt    Crohn's disease Brother    Osteoporosis Brother    Cancer Neg Hx    Colon cancer Neg Hx     PE: BP 110/78 (BP Location: Left Arm, Patient Position: Sitting, Cuff Size: Normal)    Pulse 72    Ht 5' 1"  (1.549 m)    Wt 135 lb 9.6 oz (61.5 kg)    SpO2 98%    BMI 25.62 kg/m  Wt Readings from Last 3 Encounters:  12/19/21 135 lb 9.6 oz (61.5 kg)  11/14/21 138 lb (62.6 kg)  11/11/21 138 lb (62.6 kg)   Constitutional: Overweight -truncal distribution, in NAD, walks with a cane Eyes: PERRLA, EOMI, no exophthalmos ENT: moist mucous membranes, + L slightly enlarged thyroid on palpation, no cervical lymphadenopathy Cardiovascular: RRR, No MRG Respiratory: CTA B Musculoskeletal: no deformities, strength intact in all 4 Skin: moist, warm, no rashes Neurological: no tremor with outstretched hands, DTR normal in all 4  ASSESSMENT: 1. Thyroid nodules  PLAN: 1. Thyroid nodules -Patient with a history of thyroid nodules and s/p several biopsies.  The dominant nodules are large, this being a risk factor for cancer, however, the nodules have been biopsied several times in the past with benign results.  Latest biopsies are from 2014.  Also, patient does not have a family history of thyroid cancer or personal history of radiation therapy to head or neck to increase her risk of malignancy. -In 2019, she had a CT neck (to evaluate her carotid arteries) which showed an enlarged left thyroid lobe extending in the mediastinum, with tracheal displacement.  However, in the absence of significant neck compression symptoms, there was no indication for thyroid surgery at that time.  In 2021, we checked another thyroid ultrasound and this showed stability of her thyroid nodules.  Moreover, since the nodules were stable over 5 years, no further  follow-up was indicated. We decided to follow her clinically afterwards -She now returns after 2 years.  She does not complain of neck compression symptoms.  Also, she does not feel that her thyroid nodules have enlarged.  Her neck examination is unchanged from before. -We  discussed that if she starts feeling choking, dysphagia, or pressure in neck, she may need another neck ultrasound. -Of note, she had a normal TSH in 11/2021 so we Susan not repeat this today -I Susan have her come back as needed  Philemon Kingdom, MD PhD [facility] Endocrinology

## 2021-12-19 NOTE — Patient Instructions (Addendum)
Please return to see me as needed.

## 2021-12-20 DIAGNOSIS — M41125 Adolescent idiopathic scoliosis, thoracolumbar region: Secondary | ICD-10-CM | POA: Diagnosis not present

## 2021-12-20 DIAGNOSIS — M955 Acquired deformity of pelvis: Secondary | ICD-10-CM | POA: Diagnosis not present

## 2021-12-20 DIAGNOSIS — M9903 Segmental and somatic dysfunction of lumbar region: Secondary | ICD-10-CM | POA: Diagnosis not present

## 2021-12-20 DIAGNOSIS — M9905 Segmental and somatic dysfunction of pelvic region: Secondary | ICD-10-CM | POA: Diagnosis not present

## 2021-12-23 ENCOUNTER — Other Ambulatory Visit: Payer: Self-pay

## 2021-12-23 ENCOUNTER — Ambulatory Visit (INDEPENDENT_AMBULATORY_CARE_PROVIDER_SITE_OTHER): Payer: Medicare HMO

## 2021-12-23 DIAGNOSIS — I2510 Atherosclerotic heart disease of native coronary artery without angina pectoris: Secondary | ICD-10-CM | POA: Diagnosis not present

## 2021-12-23 DIAGNOSIS — I38 Endocarditis, valve unspecified: Secondary | ICD-10-CM | POA: Diagnosis not present

## 2021-12-23 LAB — ECHOCARDIOGRAM COMPLETE
AR max vel: 1.56 cm2
AV Area VTI: 1.28 cm2
AV Area mean vel: 1.35 cm2
AV Mean grad: 6 mmHg
AV Peak grad: 9.9 mmHg
AV Vena cont: 0.3 cm
Ao pk vel: 1.57 m/s
Area-P 1/2: 3.01 cm2
Calc EF: 58.8 %
P 1/2 time: 311 msec
S' Lateral: 2.4 cm
Single Plane A2C EF: 58.4 %
Single Plane A4C EF: 58.8 %

## 2021-12-29 ENCOUNTER — Other Ambulatory Visit: Payer: Self-pay

## 2021-12-29 ENCOUNTER — Ambulatory Visit: Payer: Medicare HMO | Admitting: Internal Medicine

## 2021-12-29 ENCOUNTER — Encounter: Payer: Self-pay | Admitting: Internal Medicine

## 2021-12-29 VITALS — BP 140/76 | HR 82 | Ht 60.0 in | Wt 135.0 lb

## 2021-12-29 DIAGNOSIS — I25119 Atherosclerotic heart disease of native coronary artery with unspecified angina pectoris: Secondary | ICD-10-CM

## 2021-12-29 DIAGNOSIS — R0602 Shortness of breath: Secondary | ICD-10-CM

## 2021-12-29 DIAGNOSIS — I38 Endocarditis, valve unspecified: Secondary | ICD-10-CM | POA: Diagnosis not present

## 2021-12-29 DIAGNOSIS — I10 Essential (primary) hypertension: Secondary | ICD-10-CM | POA: Diagnosis not present

## 2021-12-29 DIAGNOSIS — E785 Hyperlipidemia, unspecified: Secondary | ICD-10-CM | POA: Diagnosis not present

## 2021-12-29 DIAGNOSIS — R0789 Other chest pain: Secondary | ICD-10-CM

## 2021-12-29 MED ORDER — FUROSEMIDE 20 MG PO TABS: 20.0000 mg | ORAL_TABLET | Freq: Every day | ORAL | 3 refills | Status: DC | PRN

## 2021-12-29 NOTE — Progress Notes (Signed)
Follow-up Outpatient Visit Date: 12/29/2021  Primary Care Provider: Tonia Ghent, MD [place] [address]  Chief Complaint: Follow-up valvular heart disease and CAD  HPI:  Susan [name] is a 76 y.o. female with history of aortic, mitral, and tricuspid regurgitation, nonobstructive coronary artery disease, hypertension, hyperlipidemia, thyroid disease, NASH, and IBS, who presents for follow-up of valvular heart disease.  I last saw her in 06/2021, at which time she was not doing well.  She had initially fractured her hip last January and was bothered by recurrent back spasms.  She felt short of breath with back spasms.  We did not make any medication changes.  Follow-up echo last week showed stable findings of mild-moderate aortic regurgitation with normal LVEF and grade 1 diastolic dysfunction.  Mild pulmonary hypertension was noted.  Today, Ms. Birkel reports that she has a little bit of shortness of breath at time to time.  It only lasts a few minutes and seems to occur when she is anxious.  She feels some associated tightness in her chest, almost as though it is taking her breath away.  Over the last 6 months, she has noticed her ankles have been a little bit swollen (left greater than right).  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  She denies palpitations and lightheadedness.  --------------------------------------------------------------------------------------------------  Cardiovascular History & Procedures: Cardiovascular Problems: Nonobstructive coronary artery disease Mitral and tricuspid regurgitation   Risk Factors: CAD, hypertension, hyperlipidemia, and age > 43   Cath/PCI: None   CV Surgery: None   EP Procedures and Devices: None   Non-Invasive Evaluation(s): TTE (12/23/2021): Normal LV size and wall thickness.  LVEF 60-65% with grade 1 diastolic dysfunction.  Normal RV size  and function.  Mild pulmonary hypertension (RVSP 38 mmHg).  Normal biatrial size.  Mild-moderate aortic regurgitation without stenosis.  Normal CVP. TTE (06/08/2020): Normal LV size and function (EF 60-65%) with grade 1 diastolic dysfunction.  Mild RVH with normal size and contraction.  Mild pulmonary hypertension.  Trivial mitral regurgitation.  Moderate to severe tricuspid regurgitation.  Mild to moderate aortic regurgitation.  Normal CVP. TTE (10/16/2018, Dr. Humphrey Rolls): Normal LV size with mild LVH.  LVEF 70-75%.  Mild to moderate mitral and moderate to severe tricuspid regurgitation.  Moderate aortic regurgitation. Mild to moderate pulmonary hypertension.  RV size and function.    Trivial pericardial effusion. Carotid Doppler (10/16/2018, Dr. Humphrey Rolls): Protruding plaque in both carotid bulbs without obstruction.  Increased in right mid ICA felt to be due to tortuosity. Cardiac CTA (09/14/15, Dr. Humphrey Rolls): CAC score 74.  Mild proximal LAD disease with haziness.  LCx and RCA are normal  Recent CV Pertinent Labs: Lab Results  Component Value Date   CHOL 160 11/10/2021   CHOL 169 10/17/2018   HDL 51.80 11/10/2021   LDLCALC 86 11/10/2021   LDLCALC 102 10/17/2018   TRIG 112.0 11/10/2021   TRIG 112 10/17/2018   CHOLHDL 3 11/10/2021   INR 1.1 11/13/2020   K 3.8 11/10/2021   MG 2.0 11/17/2020   BUN 26 (H) 11/10/2021   BUN 17 06/09/2020   CREATININE 0.64 11/10/2021   CREATININE 0.83 10/17/2018   CREATININE 0.77 01/12/2012    Past medical and surgical history were reviewed and updated in EPIC.  Current Meds  Medication Sig   acetaminophen (TYLENOL) 500 MG tablet Take 1,000 mg by mouth 2 (two) times daily.   aspirin 81 MG tablet  Take 81 mg by mouth daily.   baclofen (LIORESAL) 10 MG tablet Take 1 tablet (10 mg total) by mouth 2 (two) times daily as needed for muscle spasms.   Cholecalciferol 1000 units tablet Take 2 tablets (2,000 Units total) by mouth daily.   ezetimibe (ZETIA) 10 MG tablet TAKE 1  TABLET BY MOUTH DAILY.   furosemide (LASIX) 20 MG tablet Take 1 tablet (20 mg total) by mouth daily as needed (swelling).   lisinopril-hydrochlorothiazide (ZESTORETIC) 20-25 MG tablet TAKE 1 TABLET EVERY DAY   Pseudoephedrine-APAP-DM (DAYQUIL PO) Take by mouth daily as needed.   vitamin B-12 (CYANOCOBALAMIN) 1000 MCG tablet Take 1,000 mcg by mouth daily.    Allergies: Lipitor [atorvastatin calcium], Penicillins, and Sulfonamide derivatives  Social History   Tobacco Use   Smoking status: Former    Types: Cigarettes    Quit date: 03/19/2001    Years since quitting: 20.7   Smokeless tobacco: Never   Tobacco comments:    Quit in 2001  Vaping Use   Vaping Use: Never used  Substance Use Topics   Alcohol use: Yes    Comment: 1 glass of wine per month or 1/2 beer   Drug use: No    Family History  Problem Relation Age of Onset   Depression Mother        Manic depression   Diabetes Mother        Type 2   Breast cancer Mother    Heart disease Mother    Colon polyps Father    Heart attack Father 85   Breast cancer Maternal Aunt    Breast cancer Paternal Aunt    Crohn's disease Brother    Osteoporosis Brother    Cancer Neg Hx    Colon cancer Neg Hx     Review of Systems: A 12-system review of systems was performed and was negative except as noted in the HPI.  --------------------------------------------------------------------------------------------------  Physical Exam: BP 140/76 (BP Location: Left Arm, Patient Position: Sitting, Cuff Size: Normal)    Pulse 82    Ht 5' (1.524 m)    Wt 135 lb (61.2 kg)    SpO2 97%    BMI 26.37 kg/m   General:  NAD. Neck: No JVD or HJR. Lungs: Clear to auscultation bilaterally without wheezes or crackles. Heart: Regular rate and rhythm without murmurs, rubs, or gallops. Abdomen: Soft, nontender, nondistended. Extremities: Trace pretibial edema bilaterally.  EKG: Normal sinus rhythm with left atrial enlargement and right bundle branch  block.  No significant change in prior tracing on 06/22/2021.  Lab Results  Component Value Date   WBC 6.0 11/10/2021   HGB 12.8 11/10/2021   HCT 38.4 11/10/2021   MCV 92.4 11/10/2021   PLT 198.0 11/10/2021    Lab Results  Component Value Date   NA 140 11/10/2021   K 3.8 11/10/2021   CL 103 11/10/2021   CO2 29 11/10/2021   BUN 26 (H) 11/10/2021   CREATININE 0.64 11/10/2021   GLUCOSE 95 11/10/2021   ALT 16 11/10/2021    Lab Results  Component Value Date   CHOL 160 11/10/2021   HDL 51.80 11/10/2021   LDLCALC 86 11/10/2021   TRIG 112.0 11/10/2021   CHOLHDL 3 11/10/2021    --------------------------------------------------------------------------------------------------  ASSESSMENT AND PLAN: Valvular heart disease: Recent echo showed stable LVEF of 60-65% with mild-moderate AI and TR.  Overall, findings were stable from prior echo in 06/2020.  Given report of some increased edema as well as mild pulmonary hypertension  on recent echo, we will initiate furosemide 20 mg daily as needed for edema/weight gain.  Continue current regimen of lisinopril-HCTZ.  If Ms. Rayborn finds herself using furosemide on a routine basis, I would favor discontinuation of HCTZ.  Coronary artery disease with angina and shortness of breath: Ms. Pozo reports intermittent chest discomfort and shortness of breath.  We have agreed to obtain a pharmacologic MPI to assess for progression of CAD, as coronary CTA in 2016 by Dr. Yancey Flemings showed mild-moderate proximal LAD disease.  Hypertension: Blood pressure borderline elevated today.  Continue current regimen of lisinopril-HCTZ.  Hyperlipidemia: LDL slightly above goal at 86 on last check in January.  Continue ezetimibe for the time being given intolerance to atorvastatin in the past.  Consider challenging with an alternative statin, particularly if stress test is abnormal.  Shared Decision Making/Informed Consent The risks [chest pain, shortness of  breath, cardiac arrhythmias, dizziness, blood pressure fluctuations, myocardial infarction, stroke/transient ischemic attack, nausea, vomiting, allergic reaction, radiation exposure, metallic taste sensation and life-threatening complications (estimated to be 1 in 10,000)], benefits (risk stratification, diagnosing coronary artery disease, treatment guidance) and alternatives of a nuclear stress test were discussed in detail with Ms. Clabaugh and she agrees to proceed.  Follow-up: Return to clinic in 3 months. Nelva Bush, MD 12/30/2021 3:17 PM

## 2021-12-29 NOTE — Patient Instructions (Signed)
Medication Instructions:   Your physician has recommended you make the following change in your medication:   START Furosemide (Lasix) 20 mg daily AS NEEDED for swelling  *If you need a refill on your cardiac medications before your next appointment, please call your pharmacy*   Lab Work:  None ordered  Testing/Procedures:  [place]  Your caregiver has ordered a Clinical cytogeneticist) Stress Test with nuclear imaging. The purpose of this test is to evaluate the blood supply to your heart muscle. This procedure is referred to as a "Non-Invasive Stress Test." This is because other than having an IV started in your vein, nothing is inserted or "invades" your body. Cardiac stress tests are done to find areas of poor blood flow to the heart by determining the extent of coronary artery disease (CAD). Some patients exercise on a treadmill, which naturally increases the blood flow to your heart, while others who are  unable to walk on a treadmill due to physical limitations have a pharmacologic/chemical stress agent called Lexiscan . This medicine will mimic walking on a treadmill by temporarily increasing your coronary blood flow.   Please note: these test may take anywhere between 2-4 hours to complete  PLEASE REPORT TO [place] AT THE FIRST DESK WILL DIRECT YOU WHERE TO GO  Date of Procedure:_____________________________________  Arrival Time for Procedure:______________________________  Instructions regarding medication:   __X__:  Hold other medications as follows: HOLD Furosemide (Lasix) day of procedure  PLEASE NOTIFY THE OFFICE AT LEAST 24 HOURS IN ADVANCE IF YOU ARE UNABLE TO KEEP YOUR APPOINTMENT.  [phone] AND  PLEASE NOTIFY NUCLEAR MEDICINE AT [facility] AT LEAST 24 HOURS IN ADVANCE IF YOU ARE UNABLE TO KEEP YOUR APPOINTMENT. [phone]  How to prepare for your Myoview test:  Do not eat or drink after midnight No caffeine for 24 hours prior  to test No smoking 24 hours prior to test. Your medication may be taken with water.  If your doctor stopped a medication because of this test, do not take that medication. Ladies, please do not wear dresses.  Skirts or pants are appropriate. Please wear a short sleeve shirt. No perfume, cologne or lotion.    Follow-Up: At [facility], you and your health needs are our priority.  As part of our continuing mission to provide you with exceptional heart care, we have created designated Provider Care Teams.  These Care Teams include your primary Cardiologist (physician) and Advanced Practice Providers (APPs -  Physician Assistants and Nurse Practitioners) who all work together to provide you with the care you need, when you need it.  We recommend signing up for the patient portal called "MyChart".  Sign up information is provided on this After Visit Summary.  MyChart is used to connect with patients for Virtual Visits (Telemedicine).  Patients are able to view lab/test results, encounter notes, upcoming appointments, etc.  Non-urgent messages can be sent to your provider as well.   To learn more about what you can do with MyChart, go to [email or web address].    Your next appointment:   3 month(s)  The format for your next appointment:   In Person  Provider:   You may see Nelva Bush, MD or one of the following Advanced Practice Providers on your designated Care Team:   Murray Hodgkins, NP Christell Faith, PA-C Cadence Kathlen Mody, [place]

## 2021-12-30 ENCOUNTER — Encounter: Payer: Self-pay | Admitting: Internal Medicine

## 2021-12-30 DIAGNOSIS — R0602 Shortness of breath: Secondary | ICD-10-CM | POA: Insufficient documentation

## 2022-01-06 ENCOUNTER — Telehealth: Payer: Self-pay

## 2022-01-06 NOTE — Progress Notes (Signed)
? ? ?Chronic Care Management ?Pharmacy Assistant  ? ?Name: Susan [name]  MRN: [id number] DOB: 11/20/1945 ? ?Reason for Encounter: CCM (Initial Questions) ?  ?Recent office visits:  ?11/14/2021 - Elsie Stain, MD - Patient presented for Vit D deficiency. Immunizations: [employer]. Change: Increase Vit D to 2000 units daily.  ?11/11/2021 Orrin Brigham, LPN - Patient presented for Annual Wellness Visit.  ?11/03/2021 - Elsie Stain, MD - Lab ordered: Abnormal Results: No provider notes.  ?07/25/2021 - Elsie Stain, MD - Patient presented for Osteoporosis. No medication changes.  ? ?Recent consult visits:  ?12/29/2021 - Nelva Bush, MD - Cardiology - Patient presented for valvular heart disease. Ordered: NM Myocar Multi w/spect w/wall motion; Cardiac stress test and EKG. Start: furosemide (LASIX) 20 MG tablet due to edema/weight gain.  ?12/19/2021 - Philemon Kingdom, MD - Patient presented for multiple thyroid nodules. No medication changes.  ?10/17/2021 - Deloria Lair, MD - Ophthalmology - Patient presented for exudative age-related macular degeneration of left eye with active choroidal neovascularization. Ordered: Color Fundus Photography and OCT Retina OU.  ?07/28/2021 - Earnestine Leys - Orthopedic Surgery - Patient presented for collapsed vertebra. Procedure: CHG X-ray lumbar spine. Diagnoses: Compression fracture of lumbar spine.  ? ?Patient regularly has Chiropractic Medicine Visits with Iris Pert for Segmental and somatic dysfunction of lumbar region. ? ?Hospital visits:  ?None in previous 6 months ? ?Medications: ?Outpatient Encounter Medications as of 01/06/2022  ?Medication Sig  ? acetaminophen (TYLENOL) 500 MG tablet Take 1,000 mg by mouth 2 (two) times daily.  ? aspirin 81 MG tablet Take 81 mg by mouth daily.  ? baclofen (LIORESAL) 10 MG tablet Take 1 tablet (10 mg total) by mouth 2 (two) times daily as needed for muscle spasms.  ? Cholecalciferol 1000 units tablet Take 2 tablets (2,000 Units  total) by mouth daily.  ? ezetimibe (ZETIA) 10 MG tablet TAKE 1 TABLET BY MOUTH DAILY.  ? furosemide (LASIX) 20 MG tablet Take 1 tablet (20 mg total) by mouth daily as needed (swelling).  ? lisinopril-hydrochlorothiazide (ZESTORETIC) 20-25 MG tablet TAKE 1 TABLET EVERY DAY  ? Pseudoephedrine-APAP-DM (DAYQUIL PO) Take by mouth daily as needed.  ? vitamin B-12 (CYANOCOBALAMIN) 1000 MCG tablet Take 1,000 mcg by mouth daily.  ? ?No facility-administered encounter medications on file as of 01/06/2022.  ? ?Lab Results  ?Component Value Date/Time  ? HGBA1C 5.5 11/14/2020 04:42 AM  ? HGBA1C 6.0 02/26/2017 10:19 AM  ?  ?BP Readings from Last 3 Encounters:  ?12/29/21 140/76  ?12/19/21 110/78  ?11/14/21 116/70  ? ?Patient contacted to review initial questions prior to visit with Charlene Brooke. ? ?Have you seen any other providers since your last visit with PCP? Yes ? ?Any changes in your medications or health? Yes ?12/29/2021 Start: furosemide (LASIX) 20 MG tablet due to edema/weight gain.  ?11/14/2021 Increase: Vit D to 2000 units daily.  ? ?Patient states she took two doses of Furosemide 20 mg; she felt dizzy, sweating, nauseated and her blood pressure was low. Patient can not remember what her blood pressure was. Patient states she did not take any more of Furosemide after the two doses. Patient did not call Cardiology or make any provider aware. Patient states she has felt fine since she stopped taking it. Patient has been taking her blood pressure daily, but tries to go by memory of what it has been; does not write it down. I asked patient to take her blood pressure daily starting tomorrow and keep a log. I  advised patient that Charlene Brooke would ask for the log at the appointment on 01/11/2022. Patient understood and agreed.  ?  ?Any side effects from any medications? No ? ?Do you have an symptoms or problems not managed by your medications? No ? ?Any concerns about your health right now? Yes Her pain makes her  anxious.  ? ?Has your provider asked that you check blood pressure, blood sugar, or follow special diet at home? Yes Patient has been taking her blood pressure at home, but has not been writing it down.  ? ?Do you get any type of exercise on a regular basis? Yes Patient tries to walk as much as she can. She takes care of her grandchild so she gets exercise that way as well.  ? ?Can you think of a goal you would like to reach for your health? Yes Patient would love to be out of pain due to her osteopetrosis. Patient takes Ibuprofen 500 mg 1 tablet in the morning and Baclofen 10 mg 1 tablet in the morning. Patient will repeat the same medications in the evening as well due to pain.   ? ?Do you have any problems getting your medications? No ? ?Is there anything that you would like to discuss during the appointment? No ? ?Spoke with patient and reminded them to have all medications, supplements and any blood glucose and blood pressure readings available for review with pharmacist, at their telephone visit on 01/11/2022 at 1:30.  ? ?Star Rating Drugs:  ?Medication:   Last Fill: Day Supply ?Lisinopril-HCTZ 20-25 mg 12/25/2021 90  ? ?Care Gaps: ?Annual wellness visit in last year? Yes 11/11/2021 ?Most Recent BP reading: 140/76 on 12/29/2021 ? ?Charlene Brooke, CPP notified ? ?Marijean Niemann, RMA ?Clinical Pharmacy Assistant ?[phone] ? ?Time Spent: 50 Minutes ? ?

## 2022-01-10 DIAGNOSIS — M9903 Segmental and somatic dysfunction of lumbar region: Secondary | ICD-10-CM | POA: Diagnosis not present

## 2022-01-10 DIAGNOSIS — M9905 Segmental and somatic dysfunction of pelvic region: Secondary | ICD-10-CM | POA: Diagnosis not present

## 2022-01-10 DIAGNOSIS — M41125 Adolescent idiopathic scoliosis, thoracolumbar region: Secondary | ICD-10-CM | POA: Diagnosis not present

## 2022-01-10 DIAGNOSIS — M955 Acquired deformity of pelvis: Secondary | ICD-10-CM | POA: Diagnosis not present

## 2022-01-11 ENCOUNTER — Ambulatory Visit (INDEPENDENT_AMBULATORY_CARE_PROVIDER_SITE_OTHER): Payer: Medicare HMO | Admitting: Pharmacist

## 2022-01-11 ENCOUNTER — Other Ambulatory Visit: Payer: Self-pay

## 2022-01-11 DIAGNOSIS — I5032 Chronic diastolic (congestive) heart failure: Secondary | ICD-10-CM

## 2022-01-11 DIAGNOSIS — E785 Hyperlipidemia, unspecified: Secondary | ICD-10-CM

## 2022-01-11 DIAGNOSIS — I10 Essential (primary) hypertension: Secondary | ICD-10-CM

## 2022-01-11 DIAGNOSIS — I25119 Atherosclerotic heart disease of native coronary artery with unspecified angina pectoris: Secondary | ICD-10-CM

## 2022-01-11 DIAGNOSIS — M810 Age-related osteoporosis without current pathological fracture: Secondary | ICD-10-CM

## 2022-01-11 NOTE — Patient Instructions (Signed)
Visit Information ? ?Phone number for Pharmacist: [phone] ? ?Thank you for meeting with me to discuss your medications! I look forward to working with you to achieve your health care goals. Below is a summary of what we talked about during the visit: ? ? Goals Addressed   ? ?  ?  ?  ?  ? This Visit's Progress  ?  Track and Manage My Blood Pressure-Hypertension     ?  Timeframe:  Long-Range Goal ?Priority:  Medium ?Start Date:       01/11/22                      ?Expected End Date:   01/12/23                   ? ?Follow Up Date Sept 2023 ?  ?- check blood pressure 3 times per week ?- choose a place to take my blood pressure (home, clinic or office, retail store) ?- write blood pressure results in a log or diary  ?  ?Why is this important?   ?You won't feel high blood pressure, but it can still hurt your blood vessels.  ?High blood pressure can cause heart or kidney problems. It can also cause a stroke.  ?Making lifestyle changes like losing a little weight or eating less salt will help.  ?Checking your blood pressure at home and at different times of the day can help to control blood pressure.  ?If the doctor prescribes medicine remember to take it the way the doctor ordered.  ?Call the office if you cannot afford the medicine or if there are questions about it.   ?  ?Notes:  ?  ? ?  ? ? ?Care Plan : [place]  ?Updates made by Charlton Haws, [place] since 01/11/2022 12:00 AM  ?  ? ?Problem: Hypertension, Hyperlipidemia, Heart Failure, Coronary Artery Disease, and Osteoporosis   ?Priority: High  ?  ? ?Long-Range Goal: Disease mgmt   ?Start Date: 01/11/2022  ?Expected End Date: 01/12/2023  ?This Visit's Progress: On track  ?Priority: High  ?Note:   ?Current Barriers:  ?Unable to independently monitor therapeutic efficacy ? ?Pharmacist Clinical Goal(s):  ?Patient will achieve adherence to monitoring guidelines and medication adherence to achieve therapeutic efficacy through collaboration with PharmD and  provider.  ? ?Interventions: ?1:1 collaboration with Tonia Ghent, MD regarding development and update of comprehensive plan of care as evidenced by provider attestation and co-signature ?Inter-disciplinary care team collaboration (see longitudinal plan of care) ?Comprehensive medication review performed; medication list updated in electronic medical record ? ?Hypertension / Heart Failure (BP goal <130/80) ?-Controlled - pt reports significant dizziness, sweating, nausea, after 2nd dose of Lasix so she stopped using it; otherwise BP has been at goal and she denies further swelling issues ?-Last ejection fraction: 60-65% (Date: 12/2021) ?-HF type: Diastolic; NYHA Class: II (slight limitation of activity) ?-Current home BP readings:  ?3/3 2pm 122/57 ?3/4 123/53 125/61 ?3/5 128/65 125/64 ?3/6 128/66 123/70 ?-Current treatment: ?Lisinopril-HCTZ 20-25 mg daily - Appropriate, Effective, Safe, Accessible ?Furosemide 20 mg daily PRN - not using ?-Medications previously tried: n/a  ?-Current exercise habits: walks up and down driveway, around house ?-Educated on BP goals and benefits of medications for prevention of heart attack, stroke and kidney damage; ?Daily salt intake goal < 2300 mg; ?-Counseled to monitor BP at home 2-3x weekly, document, and provide log at future appointments ?-Recommended to continue current medication; pt to discuss  furosemide with cardiologist (currently on hold) ? ?Hyperlipidemia: (LDL goal < 70) ?-Not ideally controlled - LDL above goal (86); pt has not tolerated previous statins; she has upcoming stress test - cardiology wants to consider alternate statin depending on results ?-Hx CAD ?-Current treatment: ?Ezetimibe 10 mg daily - Appropriate, Query Effective ?Aspirin 81 mg daily - Appropriate, Effective, Safe, Accessible ?-Medications previously tried: atorvastatin 20 mg (muscle aches), rosuvastatin 10 mg ?-Educated on Cholesterol goals; Benefits of statin for ASCVD risk  reduction; ?-Recommended to continue current medication; consider rosuvastatin 5 mg daily depending on stress test results ? ?Osteoporosis (Goal prevent fractures) ?-Not ideally controlled - pt is considering Prolia, currently replacing Vitamin D for goal level > 30; lab visit scheduled 02/13/22 for repeat vit D level ?-hx Hip fracture Jan 2022, lumbar compression fracture 07/2021 ?-Last DEXA Scan: 07/08/21 (worse than previous); she has discussed Prolia with PCP 11/2021, decision to first correct Vitamin D ? T-Score femoral neck: -3.4 ? T-Score total hip: -2.4 ? T-Score lumbar spine: -3.0  ?-Patient is a candidate for pharmacologic treatment due to T-Score < -2.5 in femoral neck and T-Score < -2.5 in lumbar spine ?-Current treatment  ?Vitamin D 1000 - 2 cap daily - Appropriate, Effective, Safe, Accessible ?-Medications previously tried: alendronate x 5 years  ?-Discussed benefits of Prolia; pt will consider after ?-Recommend weight-bearing and muscle strengthening exercises for building and maintaining bone density. ? ?Back pain (Goal: manage symptoms) ?-Controlled - pt reports overall improvement in back pain lately; she is taking 1 baclofen per day ?-seeing chiropractor regularly ?-Current treatment  ?Baclofen 10 mg BID prn - Appropriate, Effective, Safe, Accessible ?Tylenol 500 mg PRN - Appropriate, Effective, Safe, Accessible ?-Medications previously tried: n/a  ?-Recommended to continue current medication ? ?Health Maintenance ?-Vaccine gaps: Shingrix, TDAP, covid booster ?-Pt plans to get all 3 vaccines at local pharmacy ?-Current therapy:  ?Vitamin B12 1000 mcg ?-Patient is satisfied with current therapy and denies issues ?-Recommended to continue current medication ? ?Patient Goals/Self-Care Activities ?Patient will:  ?- take medications as prescribed as evidenced by patient report and record review ?focus on medication adherence by routine ?check blood pressure 2-3x weekly, document, and provide at future  appointments ? ?  ?  ? ?Ms. Langner was given information about Chronic Care Management services today including:  ?CCM service includes personalized support from designated clinical staff supervised by her physician, including individualized plan of care and coordination with other care providers ?24/7 contact phone numbers for assistance for urgent and routine care needs. ?Standard insurance, coinsurance, copays and deductibles apply for chronic care management only during months in which we provide at least 20 minutes of these services. Most insurances cover these services at 100%, however patients may be responsible for any copay, coinsurance and/or deductible if applicable. This service may help you avoid the need for more expensive face-to-face services. ?Only one practitioner may furnish and bill the service in a calendar month. ?The patient may stop CCM services at any time (effective at the end of the month) by phone call to the office staff. ? ?Patient agreed to services and verbal consent obtained.  ? ?Patient verbalizes understanding of instructions and care plan provided today and agrees to view in [place]. Active MyChart status confirmed with patient.   ?Telephone follow up appointment with pharmacy team member scheduled for: 6 months ? ?Charlene Brooke, PharmD, BCACP ?Clinical Pharmacist ?[facility] ?[phone]  ?

## 2022-01-11 NOTE — Progress Notes (Signed)
Chronic Care Management Pharmacy Note  01/11/2022 Name:  Susan [name] MRN:  [id number] DOB:  Sep 06, 1946  Summary: CCM Initial visit -Pt stopped taking furosemide after 2 days due to dizziness, sweating, low BP after 2nd dose; she reports swelling is mostly resolved and she has not had repeat episode since stopping furosemide; she plans to discuss with cardiology at upcoming visit -LDL 86 above goal (goal <70 due to CAD); she has not tolerated statins but has not tried low-dose statin; she has upcoming stress test 3/30 and may consider low-dose statin depending on results -Pt has osteoporosis with 2 recent fractures (hip fracture and compression fracture); she is considering Prolia  Recommendations/Changes made from today's visit: -No med changes today -Consider trial of low-dose statin (rosuvastatin 5 mg) - pt to follow up with cardiology after stress test -Consider Prolia once Vitamin D replaced (repeat labwork 02/13/22)  Plan: -[place] will call patient 3 months for general adherence review -Pharmacist follow up televisit scheduled for 6 months    Subjective: Susan [name] is an 76 y.o. year old female who is a primary patient of Damita Dunnings, Elveria Rising, MD.  The CCM team was consulted for assistance with disease management and care coordination needs.    Engaged with patient by telephone for initial visit in response to provider referral for pharmacy case management and/or care coordination services.   Consent to Services:  The patient was given the following information about Chronic Care Management services today, agreed to services, and gave verbal consent: 1. CCM service includes personalized support from designated clinical staff supervised by the primary care provider, including individualized plan of care and coordination with other care providers 2. 24/7 contact phone numbers for assistance for urgent and routine care needs. 3. Service will only be billed when  office clinical staff spend 20 minutes or more in a month to coordinate care. 4. Only one practitioner may furnish and bill the service in a calendar month. 5.The patient may stop CCM services at any time (effective at the end of the month) by phone call to the office staff. 6. The patient will be responsible for cost sharing (co-pay) of up to 20% of the service fee (after annual deductible is met). Patient agreed to services and consent obtained.  Patient Care Team: Tonia Ghent, MD as PCP - General End, Harrell Gave, MD as PCP - Cardiology (Cardiology) Celesta Gentile, MD as Consulting Physician (Cardiology) Lorelee Cover., MD as Referring Physician (Ophthalmology) Zadie Rhine Clent Demark, MD as Consulting Physician (Ophthalmology) Charlton Haws, [facility] as Pharmacist (Pharmacist)  Recent office visits: 11/14/2021 - Elsie Stain, MD - Patient presented for Vit D deficiency. Immunizations: [employer]. Change: Increase Vit D to 2000 units daily.  11/11/2021 Orrin Brigham, LPN - Patient presented for Annual Wellness Visit.  11/03/2021 - Elsie Stain, MD - Lab ordered: Abnormal Results: No provider notes.  07/25/2021 - Elsie Stain, MD - Patient presented for Osteoporosis. No medication changes.   Recent consult visits: 12/29/2021 - Nelva Bush, MD - Cardiology - Patient presented for valvular heart disease. Ordered: Cardiac stress test and EKG. Start: furosemide (LASIX) 20 MG tablet due to edema/weight gain. Consider alternate statin depending on stress test results.  12/19/2021 - Philemon Kingdom, MD - Patient presented for multiple thyroid nodules. No medication changes. Return PRN. 10/17/2021 - Deloria Lair, MD - Ophthalmology - Patient presented for exudative age-related macular degeneration of left eye with active choroidal neovascularization. Ordered: Color Fundus Photography and OCT Retina  OU.  07/28/2021 - Earnestine Leys - Orthopedic Surgery - Patient presented for collapsed vertebra.  Procedure: CHG X-ray lumbar spine. Diagnoses: Compression fracture of lumbar spine.   Hospital visits: None in previous 6 months   Objective:  Lab Results  Component Value Date   CREATININE 0.64 11/10/2021   BUN 26 (H) 11/10/2021   GFR 85.87 11/10/2021   GFRNONAA >60 01/01/2021   GFRAA 92 06/09/2020   NA 140 11/10/2021   K 3.8 11/10/2021   CALCIUM 9.6 11/10/2021   CO2 29 11/10/2021   GLUCOSE 95 11/10/2021    Lab Results  Component Value Date/Time   HGBA1C 5.5 11/14/2020 04:42 AM   HGBA1C 6.0 02/26/2017 10:19 AM   GFR 85.87 11/10/2021 02:47 PM   GFR 84.90 05/30/2021 12:17 PM    Last diabetic Eye exam:  Lab Results  Component Value Date/Time   HMDIABEYEEXA Retinopathy (A) 05/28/2018 12:00 AM    Last diabetic Foot exam: No results found for: HMDIABFOOTEX   Lab Results  Component Value Date   CHOL 160 11/10/2021   HDL 51.80 11/10/2021   LDLCALC 86 11/10/2021   TRIG 112.0 11/10/2021   CHOLHDL 3 11/10/2021    Hepatic Function Latest Ref Rng & Units 11/10/2021 05/30/2021 01/01/2021  Total Protein 6.0 - 8.3 g/dL 7.2 7.3 7.5  Albumin 3.5 - 5.2 g/dL 4.4 4.6 4.1  AST 0 - 37 U/L 22 33 34  ALT 0 - 35 U/L 16 25 29   Alk Phosphatase 39 - 117 U/L 53 49 69  Total Bilirubin 0.2 - 1.2 mg/dL 0.5 0.7 0.6  Bilirubin, Direct 0.0 - 0.3 mg/dL - - -    Lab Results  Component Value Date/Time   TSH 2.16 11/10/2021 02:47 PM   TSH 3.51 05/30/2021 12:17 PM   FREET4 1.11 10/17/2018 12:00 AM    CBC Latest Ref Rng & Units 11/10/2021 05/30/2021 01/01/2021  WBC 4.0 - 10.5 K/uL 6.0 5.4 7.3  Hemoglobin 12.0 - 15.0 g/dL 12.8 13.3 12.9  Hematocrit 36.0 - 46.0 % 38.4 39.7 39.6  Platelets 150.0 - 400.0 K/uL 198.0 190.0 200    Lab Results  Component Value Date/Time   VD25OH 28.93 (L) 11/10/2021 02:47 PM   VD25OH 32.06 05/30/2021 12:17 PM    Clinical ASCVD: Yes  The 10-year ASCVD risk score (Arnett DK, et al., 2019) is: 26.8%   Values used to calculate the score:     Age: 4 years     Sex:  Female     Is Non-Hispanic African American: No     Diabetic: No     Tobacco smoker: No     Systolic Blood Pressure: 157 mmHg     Is BP treated: Yes     HDL Cholesterol: 51.8 mg/dL     Total Cholesterol: 160 mg/dL    Depression screen [facility] 2/9 11/11/2021 05/30/2021 04/23/2019  Decreased Interest 0 0 0  Down, Depressed, Hopeless 1 0 0  PHQ - 2 Score 1 0 0  Altered sleeping - 0 0  Tired, decreased energy - 0 0  Change in appetite - 0 0  Feeling bad or failure about yourself  - 0 0  Trouble concentrating - 0 0  Moving slowly or fidgety/restless - 0 0  Suicidal thoughts - 0 0  PHQ-9 Score - 0 0  Difficult doing work/chores - - Not difficult at all     Social History   Tobacco Use  Smoking Status Former   Types: Cigarettes   Quit date: 03/19/2001  Years since quitting: 20.8  Smokeless Tobacco Never  Tobacco Comments   Quit in 2001   BP Readings from Last 3 Encounters:  12/29/21 140/76  12/19/21 110/78  11/14/21 116/70   Pulse Readings from Last 3 Encounters:  12/29/21 82  12/19/21 72  11/14/21 91   Wt Readings from Last 3 Encounters:  12/29/21 135 lb (61.2 kg)  12/19/21 135 lb 9.6 oz (61.5 kg)  11/14/21 138 lb (62.6 kg)   BMI Readings from Last 3 Encounters:  12/29/21 26.37 kg/m  12/19/21 25.62 kg/m  11/14/21 26.07 kg/m    Assessment/Interventions: Review of patient past medical history, allergies, medications, health status, including review of consultants reports, laboratory and other test data, was performed as part of comprehensive evaluation and provision of chronic care management services.   SDOH:  (Social Determinants of Health) assessments and interventions performed: No - last assessed 11/2021 AWV  SDOH Screenings   Alcohol Screen: Low Risk    Last Alcohol Screening Score (AUDIT): 1  Depression (PHQ2-9): Low Risk    PHQ-2 Score: 1  Financial Resource Strain: Low Risk    Difficulty of Paying Living Expenses: Not very hard  Food Insecurity: No Food  Insecurity   Worried About Charity fundraiser in the Last Year: Never true   Ran Out of Food in the Last Year: Never true  Housing: Low Risk    Last Housing Risk Score: 0  Physical Activity: Insufficiently Active   Days of Exercise per Week: 7 days   Minutes of Exercise per Session: 20 min  Social Connections: Moderately Isolated   Frequency of Communication with Friends and Family: More than three times a week   Frequency of Social Gatherings with Friends and Family: Three times a week   Attends Religious Services: Never   Active Member of Clubs or Organizations: No   Attends Archivist Meetings: Never   Marital Status: Married  Stress: No Stress Concern Present   Feeling of Stress : Not at all  Tobacco Use: Medium Risk   Smoking Tobacco Use: Former   Smokeless Tobacco Use: Never   Passive Exposure: Not on file  Transportation Needs: No Transportation Needs   Lack of Transportation (Medical): No   Lack of Transportation (Non-Medical): No    CCM Care Plan  Allergies  Allergen Reactions   Lipitor [Atorvastatin Calcium] Other (See Comments)    Aches on med at 79m a day.    Penicillins Swelling and Rash    Rash and "swelling" in high school. Tolerated cefazolin 11/14/2020    Sulfonamide Derivatives Rash    Medications Reviewed Today     Reviewed by [facility]Charlton Haws [facility](Pharmacist) on 01/11/22 at 1420  Med List Status: <None>   Medication Order Taking? Sig Documenting Provider Last Dose Status Informant  acetaminophen (TYLENOL) 500 MG tablet [id number] Take 1,000 mg by mouth 2 (two) times daily. [provider] Taking Active Self  aspirin 81 MG tablet [id number] Take 81 mg by mouth daily. [provider] Taking Active Self  baclofen (LIORESAL) 10 MG tablet [id number] Take 1 tablet (10 mg total) by mouth 2 (two) times daily as needed for muscle spasms. DTonia Ghent MD Taking Active   Cholecalciferol 1000 units tablet [id number] Yes Take 2 tablets (2,000 Units total) by mouth daily. DTonia Ghent MD Taking Active   ezetimibe (ZETIA) 10 MG tablet [id number] TAKE 1 TABLET BY MOUTH DAILY. ENelva Bush MD Taking Active  furosemide (LASIX) 20 MG tablet [id number] No Take 1 tablet (20 mg total) by mouth daily as needed (swelling).  Patient not taking: Reported on 01/11/2022   Nelva Bush, MD Not Taking Active            Med Note Malena Catholic Jan 11, 2022  1:43 PM) Low BP/dizziness with dose  lisinopril-hydrochlorothiazide (ZESTORETIC) 20-25 MG tablet [id number] Yes TAKE 1 TABLET EVERY DAY End, Christopher, MD Taking Active   vitamin B-12 (CYANOCOBALAMIN) 1000 MCG tablet [id number] Yes Take 1,000 mcg by mouth daily. [provider] Taking Active Self            Patient Active Problem List   Diagnosis Date Noted   SOB (shortness of breath) 12/30/2021   Back pain 11/16/2021   Advanced nonexudative age-related macular degeneration of left eye without subfoveal involvement 03/10/2021   Tricuspid valve insufficiency    NASH (nonalcoholic steatohepatitis)    Chronic Grade I diastolic CHF on echocardiogram 06/08/20 ([place]) 11/13/2020   Valvular heart disease 06/09/2020   Exudative age-related macular degeneration of left eye with active choroidal neovascularization ([place]) 02/10/2020   Exudative age-related macular degeneration of right eye with inactive choroidal neovascularization ([place]) 02/10/2020   Posterior vitreous detachment of right eye 02/10/2020   Posterior vitreous detachment of left eye 02/10/2020   Advanced nonexudative age-related macular degeneration of right eye with subfoveal involvement 02/10/2020   Mitral valve insufficiency 12/12/2019   Aortic valve regurgitation 12/12/2019   Coronary artery disease involving native coronary artery of native heart with angina pectoris ([place]) 09/05/2019   Health care maintenance 02/03/2018   Carpal tunnel syndrome of right wrist 04/10/2017    Hyperlipidemia LDL goal <70 07/23/2016   Dupuytren contracture 03/24/2015   Advance care planning 03/24/2015   Benign paroxysmal positional vertigo 11/09/2014   Anxiety state 11/09/2014   Medicare annual wellness visit, initial 08/25/2013   Multiple thyroid nodules 08/06/2013   Bilateral carotid artery stenosis 05/22/2013   Memory changes 08/27/2012   B12 deficiency 11/18/2010   Irritable bowel syndrome 11/11/2010   DIVERTICULOSIS, COLON 11/04/2010   Hyperglycemia 08/12/2010   MIGRAINE HEADACHE 12/27/2007   Essential hypertension 12/27/2007   MENOPAUSAL SYNDROME 12/27/2007   Osteoporosis 11/05/2007    Immunization History  Administered Date(s) Administered   Fluad Quad(high Dose 65+) 11/14/2021   Influenza Whole 08/12/2010   Influenza, Seasonal, Injecte, Preservative Fre 12/13/2012   Influenza,inj,Quad PF,6+ Mos 08/25/2013, 09/03/2014, 07/13/2016, 12/13/2017   PFIZER(Purple Top)SARS-COV-2 Vaccination 12/29/2019, 01/19/2020, 09/20/2020   Pneumococcal Conjugate-13 03/22/2015   Pneumococcal Polysaccharide-23 08/12/2010   Td 11/06/1998, 08/12/2010    Conditions to be addressed/monitored:  Hypertension, Hyperlipidemia, Heart Failure, Coronary Artery Disease, and Osteoporosis  Care Plan : [place]  Updates made by Charlton Haws, [place] since 01/11/2022 12:00 AM     Problem: Hypertension, Hyperlipidemia, Heart Failure, Coronary Artery Disease, and Osteoporosis   Priority: High     Long-Range Goal: Disease mgmt   Start Date: 01/11/2022  Expected End Date: 01/12/2023  This Visit's Progress: On track  Priority: High  Note:   Current Barriers:  Unable to independently monitor therapeutic efficacy  Pharmacist Clinical Goal(s):  Patient will achieve adherence to monitoring guidelines and medication adherence to achieve therapeutic efficacy through collaboration with PharmD and provider.   Interventions: 1:1 collaboration with Tonia Ghent, MD regarding  development and update of comprehensive plan of care as evidenced by provider attestation and co-signature Inter-disciplinary care team collaboration (see longitudinal plan of care) Comprehensive medication  review performed; medication list updated in electronic medical record  Hypertension / Heart Failure (BP goal <130/80) -Controlled - pt reports significant dizziness, sweating, nausea, after 2nd dose of Lasix so she stopped using it; otherwise BP has been at goal and she denies further swelling issues -Last ejection fraction: 60-65% (Date: 12/2021) -HF type: Diastolic; NYHA Class: II (slight limitation of activity) -Current home BP readings:  3/3 2pm 122/57 3/4 123/53 125/61 3/5 128/65 125/64 3/6 128/66 123/70 -Current treatment: Lisinopril-HCTZ 20-25 mg daily - Appropriate, Effective, Safe, Accessible Furosemide 20 mg daily PRN - not using -Medications previously tried: n/a  -Current exercise habits: walks up and down driveway, around house -Educated on BP goals and benefits of medications for prevention of heart attack, stroke and kidney damage; Daily salt intake goal < 2300 mg; -Counseled to monitor BP at home 2-3x weekly, document, and provide log at future appointments -Recommended to continue current medication; pt to discuss furosemide with cardiologist (currently on hold)  Hyperlipidemia: (LDL goal < 70) -Not ideally controlled - LDL above goal (86); pt has not tolerated previous statins; she has upcoming stress test - cardiology wants to consider alternate statin depending on results -Hx CAD -Current treatment: Ezetimibe 10 mg daily - Appropriate, Query Effective Aspirin 81 mg daily - Appropriate, Effective, Safe, Accessible -Medications previously tried: atorvastatin 20 mg (muscle aches), rosuvastatin 10 mg -Educated on Cholesterol goals; Benefits of statin for ASCVD risk reduction; -Recommended to continue current medication; consider rosuvastatin 5 mg daily depending on  stress test results  Osteoporosis (Goal prevent fractures) -Not ideally controlled - pt is considering Prolia, currently replacing Vitamin D for goal level > 30; lab visit scheduled 02/13/22 for repeat vit D level -hx Hip fracture Jan 2022, lumbar compression fracture 07/2021 -Last DEXA Scan: 07/08/21 (worse than previous); she has discussed Prolia with PCP 11/2021, decision to first correct Vitamin D  T-Score femoral neck: -3.4  T-Score total hip: -2.4  T-Score lumbar spine: -3.0  -Patient is a candidate for pharmacologic treatment due to T-Score < -2.5 in femoral neck and T-Score < -2.5 in lumbar spine -Current treatment  Vitamin D 1000 - 2 cap daily - Appropriate, Effective, Safe, Accessible -Medications previously tried: alendronate x 5 years  -Discussed benefits of Prolia; pt will consider after -Recommend weight-bearing and muscle strengthening exercises for building and maintaining bone density.  Back pain (Goal: manage symptoms) -Controlled - pt reports overall improvement in back pain lately; she is taking 1 baclofen per day -seeing chiropractor regularly -Current treatment  Baclofen 10 mg BID prn - Appropriate, Effective, Safe, Accessible Tylenol 500 mg PRN - Appropriate, Effective, Safe, Accessible -Medications previously tried: n/a  -Recommended to continue current medication  Health Maintenance -Vaccine gaps: Shingrix, TDAP, covid booster -Pt plans to get all 3 vaccines at local pharmacy -Current therapy:  Vitamin B12 1000 mcg -Patient is satisfied with current therapy and denies issues -Recommended to continue current medication  Patient Goals/Self-Care Activities Patient will:  - take medications as prescribed as evidenced by patient report and record review focus on medication adherence by routine check blood pressure 2-3x weekly, document, and provide at future appointments       Medication Assistance: None required.  Patient affirms current coverage meets  needs.  Compliance/Adherence/Medication fill history: Care Gaps: Colonoscopy (due 03/22/21)  Star-Rating Drugs: Lisinopril-HCTZ - PDC 100%  Patient's preferred pharmacy is:  [facility] [place], [place] [address] Phone: [phone] Fax: [phone]  Uses pill box? No -   Pt endorses 100% compliance  We discussed: Current pharmacy is preferred with insurance plan and patient is satisfied with pharmacy services Patient decided to: Continue current medication management strategy  Care Plan and Follow Up Patient Decision:  Patient agrees to Care Plan and Follow-up.  Plan: Telephone follow up appointment with care management team member scheduled for:  6 months  Charlene Brooke, PharmD, BCACP Clinical Pharmacist [facility] [phone]

## 2022-01-13 ENCOUNTER — Telehealth: Payer: Self-pay | Admitting: Internal Medicine

## 2022-01-13 NOTE — Telephone Encounter (Signed)
Patient's PCP advised her to get COVID booster, shingles shot, and Tetanus injection. She asks should she wait until after her stress test to get these. Please call and advise.

## 2022-01-13 NOTE — Telephone Encounter (Signed)
Called and spoke with pt. Notified pt should be fine and should not affect her stress test that is schedule 02/02/22.  ?However, I did advise pt to not get all three injections at the same time. Also advised, there is always possibility of side effects from any of all three injections, so should keep this in mind.  ? ?Pt appreciative and voiced she does plan to get injections separately.  ?Pt states she may wait until after her stress test since they are not urgent.  ? ?Pt appreciative and has no further questions.  ?

## 2022-01-16 ENCOUNTER — Ambulatory Visit (INDEPENDENT_AMBULATORY_CARE_PROVIDER_SITE_OTHER): Payer: Medicare HMO | Admitting: Ophthalmology

## 2022-01-16 ENCOUNTER — Encounter (INDEPENDENT_AMBULATORY_CARE_PROVIDER_SITE_OTHER): Payer: Self-pay | Admitting: Ophthalmology

## 2022-01-16 ENCOUNTER — Other Ambulatory Visit: Payer: Self-pay

## 2022-01-16 DIAGNOSIS — H353114 Nonexudative age-related macular degeneration, right eye, advanced atrophic with subfoveal involvement: Secondary | ICD-10-CM | POA: Diagnosis not present

## 2022-01-16 DIAGNOSIS — H353212 Exudative age-related macular degeneration, right eye, with inactive choroidal neovascularization: Secondary | ICD-10-CM

## 2022-01-16 DIAGNOSIS — H353221 Exudative age-related macular degeneration, left eye, with active choroidal neovascularization: Secondary | ICD-10-CM

## 2022-01-16 NOTE — Progress Notes (Signed)
01/16/2022     CHIEF COMPLAINT Patient presents for  Chief Complaint  Patient presents with   Macular Degeneration      HISTORY OF PRESENT ILLNESS: Susan [name] is a 76 y.o. female who presents to the clinic today for:   HPI   3 mos dilate OU, OCT, color FP. Pt states "I am not seeing as well as I was. I am having a problem with reading, I noticed it is harder to read. I have to use magnifying glass."  Last edited by Laurin Coder on 01/16/2022  2:16 PM.      Referring physician: Lorelee Cover., MD [place],  [place] [address]  HISTORICAL INFORMATION:   Selected notes from the MEDICAL RECORD NUMBER    Lab Results  Component Value Date   HGBA1C 5.5 11/14/2020     CURRENT MEDICATIONS: No current outpatient medications on file. (Ophthalmic Drugs)   No current facility-administered medications for this visit. (Ophthalmic Drugs)   Current Outpatient Medications (Other)  Medication Sig   acetaminophen (TYLENOL) 500 MG tablet Take 1,000 mg by mouth 2 (two) times daily.   aspirin 81 MG tablet Take 81 mg by mouth daily.   baclofen (LIORESAL) 10 MG tablet Take 1 tablet (10 mg total) by mouth 2 (two) times daily as needed for muscle spasms.   Cholecalciferol 1000 units tablet Take 2 tablets (2,000 Units total) by mouth daily.   ezetimibe (ZETIA) 10 MG tablet TAKE 1 TABLET BY MOUTH DAILY.   furosemide (LASIX) 20 MG tablet Take 1 tablet (20 mg total) by mouth daily as needed (swelling). (Patient not taking: Reported on 01/11/2022)   lisinopril-hydrochlorothiazide (ZESTORETIC) 20-25 MG tablet TAKE 1 TABLET EVERY DAY   vitamin B-12 (CYANOCOBALAMIN) 1000 MCG tablet Take 1,000 mcg by mouth daily.   No current facility-administered medications for this visit. (Other)      REVIEW OF SYSTEMS: ROS   Negative for: Constitutional, Gastrointestinal, Neurological, Skin, Genitourinary, Musculoskeletal, HENT, Endocrine, Cardiovascular, Eyes, Respiratory,  Psychiatric, Allergic/Imm, Heme/Lymph Last edited by Hurman Horn, MD on 01/16/2022  3:25 PM.       ALLERGIES Allergies  Allergen Reactions   Lipitor [Atorvastatin Calcium] Other (See Comments)    Aches on med at 64m a day.    Penicillins Swelling and Rash    Rash and "swelling" in high school. Tolerated cefazolin 11/14/2020    Sulfonamide Derivatives Rash    PAST MEDICAL HISTORY Past Medical History:  Diagnosis Date   Colon cancer screening    Diverticulosis of colon    Exudative age-related macular degeneration of right eye [place] 03[place]   Hyperlipidemia    Hypertension    IBS (irritable bowel syndrome)    Macular degeneration    Migraine, unspecified, without mention of intractable migraine without mention of status migrainosus    NASH (nonalcoholic steatohepatitis)    Nonobstructive atherosclerosis of coronary artery 2016   Cardiac CTA with mild proximal LAD disease; CAC score 74.   Ocular migraine    Osteoporosis    on Fosamax for 5 years   Other abnormal glucose    Other chronic nonalcoholic liver disease    LFTs normalized with weight loss 2013   Other screening mammogram    Symptomatic menopausal or female climacteric states    Thyroid disease    Tubular adenoma of colon    Urinary tract infection, site not specified    Past Surgical History:  Procedure Laterality Date  abdominal ultrasound  04/12/2004 & 6/06   positive gallstones   ANKLE FRACTURE SURGERY  ~2002   pinning   BTL  30+ years ago   CATARACT EXTRACTION  2013   B eyes   COLONOSCOPY  06/08/06   Polyps, divertics (Dr. Sharlett Iles)   CT abdomen  04/29/04   Positive gallstones   INTRAMEDULLARY (IM) NAIL INTERTROCHANTERIC Left 11/14/2020   Procedure: INTRAMEDULLARY (IM) NAIL INTERTROCHANTRIC;  Surgeon: Earnestine Leys, MD;  Location: [facility] ORS;  Service: Orthopedics;  Laterality: Left;   TONSILLECTOMY AND ADENOIDECTOMY  Age 45   ultrasound pelvis  6/06    Negative    FAMILY HISTORY Family History  Problem Relation Age of Onset   Depression Mother        Manic depression   Diabetes Mother        Type 2   Breast cancer Mother    Heart disease Mother    Colon polyps Father    Heart attack Father 34   Breast cancer Maternal Aunt    Breast cancer Paternal Aunt    Crohn's disease Brother    Osteoporosis Brother    Cancer Neg Hx    Colon cancer Neg Hx     SOCIAL HISTORY Social History   Tobacco Use   Smoking status: Former    Types: Cigarettes    Quit date: 03/19/2001    Years since quitting: 20.8   Smokeless tobacco: Never   Tobacco comments:    Quit in 2001  Vaping Use   Vaping Use: Never used  Substance Use Topics   Alcohol use: Yes    Comment: 1 glass of wine per month or 1/2 beer   Drug use: No         OPHTHALMIC EXAM:  Base Eye Exam     Visual Acuity (ETDRS)       Right Left   Dist cc CF at 3' 20/40 -1   Dist ph cc 20/400     Correction: Glasses         Tonometry (Tonopen, 2:13 PM)       Right Left   Pressure 16 20         Pupils       Pupils Dark Light APD   Right PERRL 4 3 None   Left PERRL 4 3 None         Visual Fields (Counting fingers)       Left Right    Full    Restrictions  Partial inner superior temporal, superior nasal, inferior nasal deficiencies         Extraocular Movement       Right Left    Full Full         Neuro/Psych     Oriented x3: Yes   Mood/Affect: Normal         Dilation     Both eyes: 1.0% Mydriacyl, 2.5% Phenylephrine @ 2:13 PM           Slit Lamp and Fundus Exam     External Exam       Right Left   External Normal Normal         Slit Lamp Exam       Right Left   Lids/Lashes Normal Normal   Conjunctiva/Sclera White and quiet White and quiet   Cornea Clear Clear   Anterior Chamber Deep and quiet Deep and quiet   Iris Round and reactive Round and reactive   Lens Posterior chamber intraocular  lens Posterior chamber  intraocular lens   Anterior Vitreous Normal Normal         Fundus Exam       Right Left   Posterior Vitreous Posterior vitreous detachment Posterior vitreous detachment   Disc Normal Normal   C/D Ratio 0.55 0.55   Macula Geographic atrophy centrally, 6-7 DA size Disciform scar, Geographic atrophy near the FAZ, no exudates, Retinal pigment epithelial mottling, no hemorrhage   Vessels Normal Normal   Periphery Normal Normal            IMAGING AND PROCEDURES  Imaging and Procedures for 01/16/22  OCT, Retina - OU - Both Eyes       Right Eye Quality was good. Scan locations included subfoveal. Central Foveal Thickness: 197. Progression has been stable. Findings include central retinal atrophy, inner retinal atrophy, outer retinal atrophy, abnormal foveal contour, retinal drusen , no IRF, no SRF.   Left Eye Quality was good. Scan locations included subfoveal. Central Foveal Thickness: 269. Progression has been stable. Findings include abnormal foveal contour, no SRF, disciform scar, subretinal scarring, central retinal atrophy.   Notes OD, no active CNVM, no OS with much less subretinal fluid CME as compared to January 2021.  Currently at 96-monthfollow-up left eye and no therapy for 7 months OS       Color Fundus Photography Optos - OU - Both Eyes       Right Eye Progression has been stable. Disc findings include normal observations. Macula : geographic atrophy. Vessels : normal observations. Periphery : normal observations.   Left Eye Progression has been stable. Disc findings include normal observations. Macula : geographic atrophy. Vessels : normal observations. Periphery : normal observations.   Notes OS, no signs of recurrence of CNVM stable overall  Geographic atrophy limits vision OD and slightly OS             ASSESSMENT/PLAN:  Exudative age-related macular degeneration of right eye with inactive choroidal neovascularization (HCC) No sign of  recurrence OD  Exudative age-related macular degeneration of left eye with active choroidal neovascularization (HCC) OS now no treatment for 7 months.  No disease activity we will continue to monitor and observe     ICD-10-CM   1. Exudative age-related macular degeneration of left eye with active choroidal neovascularization (HCC)  H35.3221 OCT, Retina - OU - Both Eyes    Color Fundus Photography Optos - OU - Both Eyes    2. Advanced nonexudative age-related macular degeneration of right eye with subfoveal involvement  H35.3114 OCT, Retina - OU - Both Eyes    Color Fundus Photography Optos - OU - Both Eyes    3. Exudative age-related macular degeneration of right eye with inactive choroidal neovascularization (HCC)  H35.3212       1.  OS, no sign of recurrence or of CNVM.  Patient understands critical importance of monitoring vision if new onset distortion or decline develops in the left eye or either eye, prompt a phone call and evaluation would be appropriate  2.  3.  Ophthalmic Meds Ordered this visit:  No orders of the defined types were placed in this encounter.      Return in about 5 months (around 06/18/2022) for DILATE OU, COLOR FP, OCT.  There are no Patient Instructions on file for this visit.   Explained the diagnoses, plan, and follow up with the patient and they expressed understanding.  Patient expressed understanding of the importance of proper follow up care.  Susan [name] M.D. Diseases & Surgery of the Retina and Vitreous [facility]ina [facility] [place] 01/16/22     Abbreviations: M myopia (nearsighted); A astigmatism; H hyperopia (farsighted); P presbyopia; Mrx spectacle prescription;  CTL contact lenses; OD right eye; OS left eye; OU both eyes  XT exotropia; ET esotropia; PEK punctate epithelial keratitis; PEE punctate epithelial erosions; DES dry eye syndrome; MGD meibomian gland dysfunction; ATs artificial tears; PFAT's preservative free artificial  tears; [place]y nuclear sclerotic cataract; PSC posterior subcapsular cataract; ERM epi-retinal membrane; PVD posterior vitreous detachment; RD retinal detachment; DM diabetes mellitus; DR diabetic retinopathy; NPDR non-proliferative diabetic retinopathy; PDR proliferative diabetic retinopathy; CSME clinically significant macular edema; DME diabetic macular edema; dbh dot blot hemorrhages; CWS cotton wool spot; POAG primary open angle glaucoma; C/D cup-to-disc ratio; HVF humphrey visual field; GVF goldmann visual field; OCT optical coherence tomography; IOP intraocular pressure; BRVO Branch retinal vein occlusion; CRVO central retinal vein occlusion; CRAO central retinal artery occlusion; BRAO branch retinal artery occlusion; RT retinal tear; SB scleral buckle; PPV pars plana vitrectomy; VH Vitreous hemorrhage; PRP panretinal laser photocoagulation; IVK intravitreal kenalog; VMT vitreomacular traction; MH Macular hole;  NVD neovascularization of the disc; NVE neovascularization elsewhere; AREDS age related eye disease study; ARMD age related macular degeneration; POAG primary open angle glaucoma; EBMD epithelial/anterior basement membrane dystrophy; ACIOL anterior chamber intraocular lens; IOL intraocular lens; PCIOL posterior chamber intraocular lens; Phaco/IOL phacoemulsification with intraocular lens placement; [place] photorefractive keratectomy; LASIK laser assisted in situ keratomileusis; HTN hypertension; DM diabetes mellitus; COPD chronic obstructive pulmonary disease

## 2022-01-16 NOTE — Assessment & Plan Note (Signed)
OS now no treatment for 7 months.  No disease activity we will continue to monitor and observe ?

## 2022-01-16 NOTE — Assessment & Plan Note (Signed)
No sign of recurrence OD ?

## 2022-02-02 ENCOUNTER — Encounter
Admission: RE | Admit: 2022-02-02 | Discharge: 2022-02-02 | Disposition: A | Discharge status: Home / Self Care | Payer: Medicare HMO | Source: Ambulatory Visit | Attending: Internal Medicine | Admitting: Internal Medicine

## 2022-02-02 DIAGNOSIS — R0602 Shortness of breath: Secondary | ICD-10-CM

## 2022-02-02 DIAGNOSIS — I25119 Atherosclerotic heart disease of native coronary artery with unspecified angina pectoris: Secondary | ICD-10-CM | POA: Diagnosis not present

## 2022-02-02 MED ORDER — REGADENOSON 0.4 MG/5ML IV SOLN
0.4000 mg | Freq: Once | INTRAVENOUS | Status: AC
Start: 2022-02-02 — End: 2022-02-02
  Administered 2022-02-02: 0.4 mg via INTRAVENOUS

## 2022-02-02 MED ORDER — TECHNETIUM TC 99M TETROFOSMIN IV KIT
10.0000 | PACK | Freq: Once | INTRAVENOUS | Status: AC | PRN
  Administered 2022-02-02: 10.4 via INTRAVENOUS

## 2022-02-02 MED ORDER — TECHNETIUM TC 99M TETROFOSMIN IV KIT
32.2100 | PACK | Freq: Once | INTRAVENOUS | Status: AC | PRN
  Administered 2022-02-02: 32.21 via INTRAVENOUS

## 2022-02-03 DIAGNOSIS — I5032 Chronic diastolic (congestive) heart failure: Secondary | ICD-10-CM

## 2022-02-03 DIAGNOSIS — I25119 Atherosclerotic heart disease of native coronary artery with unspecified angina pectoris: Secondary | ICD-10-CM

## 2022-02-03 DIAGNOSIS — Z87891 Personal history of nicotine dependence: Secondary | ICD-10-CM

## 2022-02-03 DIAGNOSIS — M810 Age-related osteoporosis without current pathological fracture: Secondary | ICD-10-CM

## 2022-02-03 DIAGNOSIS — E785 Hyperlipidemia, unspecified: Secondary | ICD-10-CM | POA: Diagnosis not present

## 2022-02-03 DIAGNOSIS — I110 Hypertensive heart disease with heart failure: Secondary | ICD-10-CM | POA: Diagnosis not present

## 2022-02-03 LAB — NM MYOCAR MULTI W/SPECT W/WALL MOTION / EF
LV dias vol: 36 mL (ref 46–106)
LV sys vol: 11 mL
MPHR: 144 {beats}/min
Nuc Stress EF: 69 %
Peak HR: 110 {beats}/min
Percent HR: 76 %
Rest HR: 73 {beats}/min
Rest Nuclear Isotope Dose: 10.4 mCi
SDS: 2
SRS: 11
SSS: 10
ST Depression (mm): 0 mm
Stress Nuclear Isotope Dose: 32.2 mCi
TID: 0.71

## 2022-02-06 DIAGNOSIS — M41125 Adolescent idiopathic scoliosis, thoracolumbar region: Secondary | ICD-10-CM | POA: Diagnosis not present

## 2022-02-06 DIAGNOSIS — M9905 Segmental and somatic dysfunction of pelvic region: Secondary | ICD-10-CM | POA: Diagnosis not present

## 2022-02-06 DIAGNOSIS — M955 Acquired deformity of pelvis: Secondary | ICD-10-CM | POA: Diagnosis not present

## 2022-02-06 DIAGNOSIS — M9903 Segmental and somatic dysfunction of lumbar region: Secondary | ICD-10-CM | POA: Diagnosis not present

## 2022-02-13 ENCOUNTER — Other Ambulatory Visit (INDEPENDENT_AMBULATORY_CARE_PROVIDER_SITE_OTHER): Payer: Medicare HMO

## 2022-02-13 DIAGNOSIS — E559 Vitamin D deficiency, unspecified: Secondary | ICD-10-CM

## 2022-02-13 LAB — VITAMIN D 25 HYDROXY (VIT D DEFICIENCY, FRACTURES): VITD: 40.35 ng/mL (ref 30.00–100.00)

## 2022-02-14 IMAGING — CR DG CHEST 1V
1 series · 1 of 1 positions shown · non-contrast
Comparison: Chest radiograph 08/30/2015

CLINICAL DATA: Mechanical fall at home with left hip pain.

EXAM:
CHEST  1 VIEW

[chest pa]
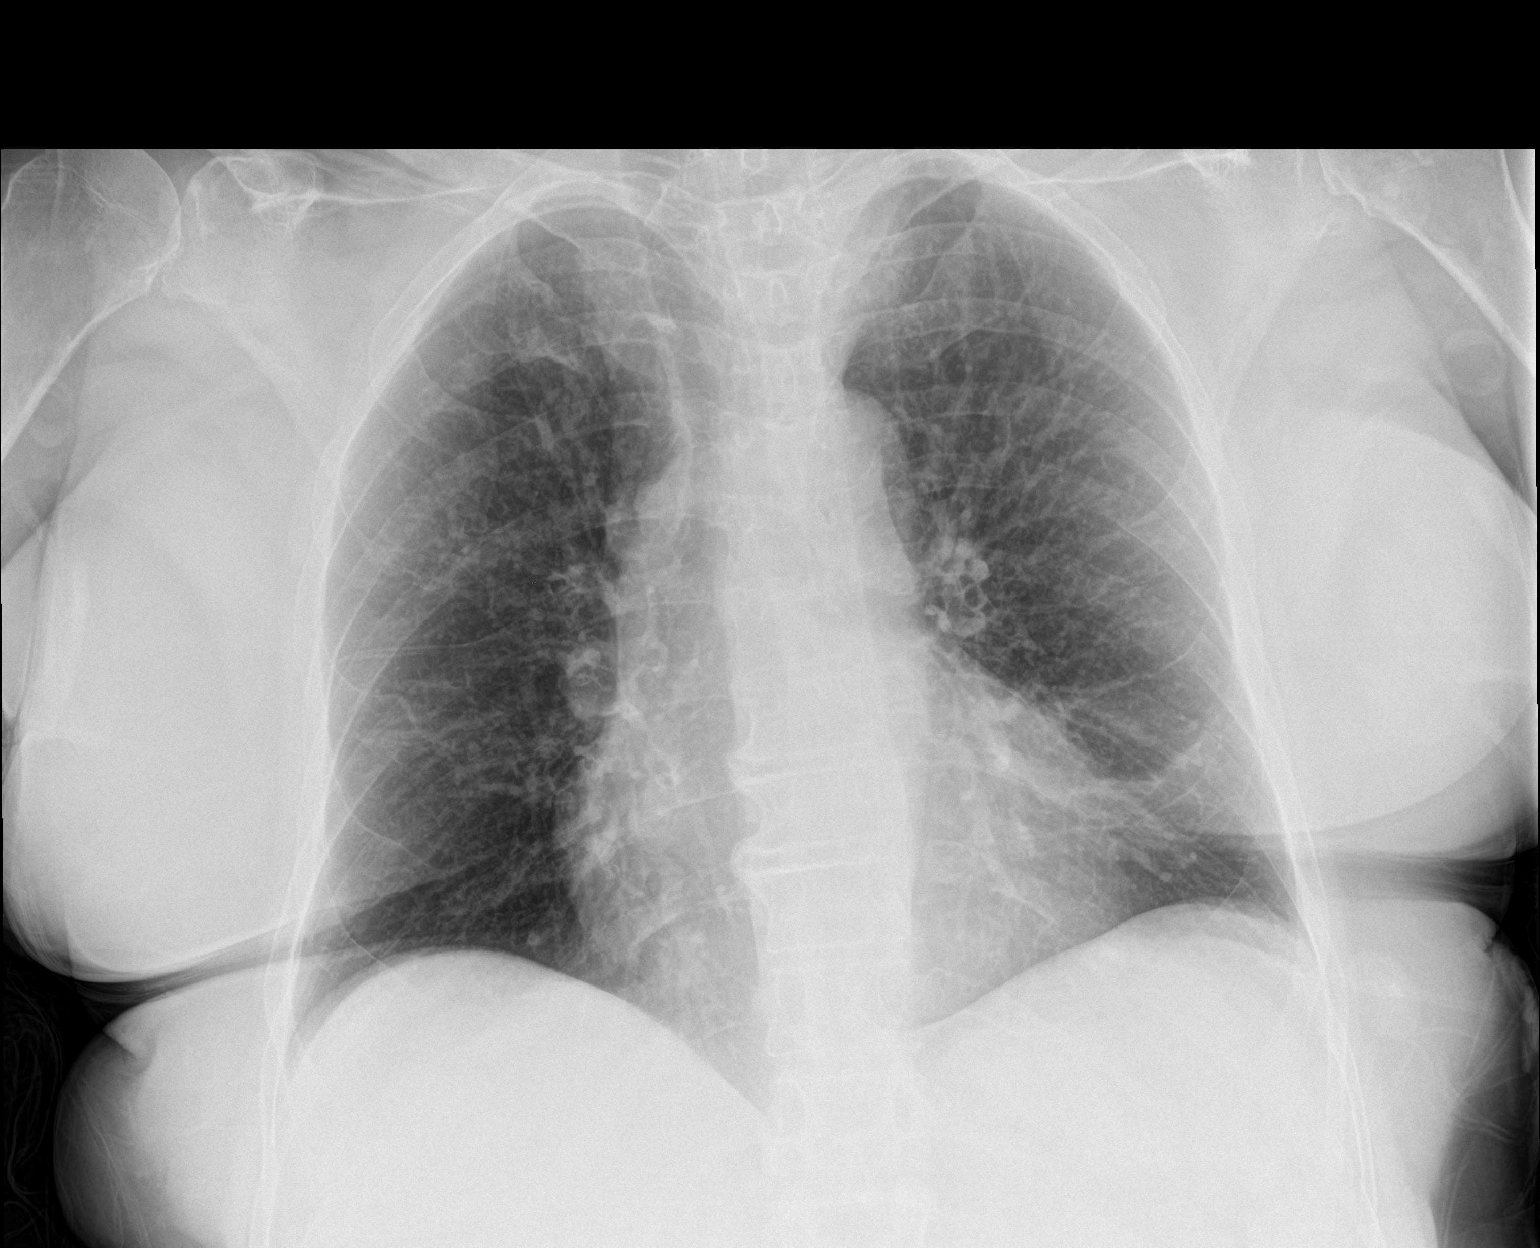

[1 of 1 positions shown; findings below may reference images not displayed]

FINDINGS: The cardiomediastinal contours are normal. Stable slight rightward
tracheal deviation from prior. Pulmonary vasculature is normal.
Minimal scarring in the lingula. No consolidation, pleural effusion,
or pneumothorax. No acute osseous abnormalities are seen.
IMPRESSION: No acute chest finding.

## 2022-02-14 IMAGING — CR DG HIP (WITH OR WITHOUT PELVIS) 2-3V*L*
3 series · 3 of 3 positions shown · non-contrast
Comparison: None.

CLINICAL DATA: 74-year-old female with fall and left hip pain.

EXAM:
DG HIP (WITH OR WITHOUT PELVIS) 2-3V LEFT

[pelvis ap]
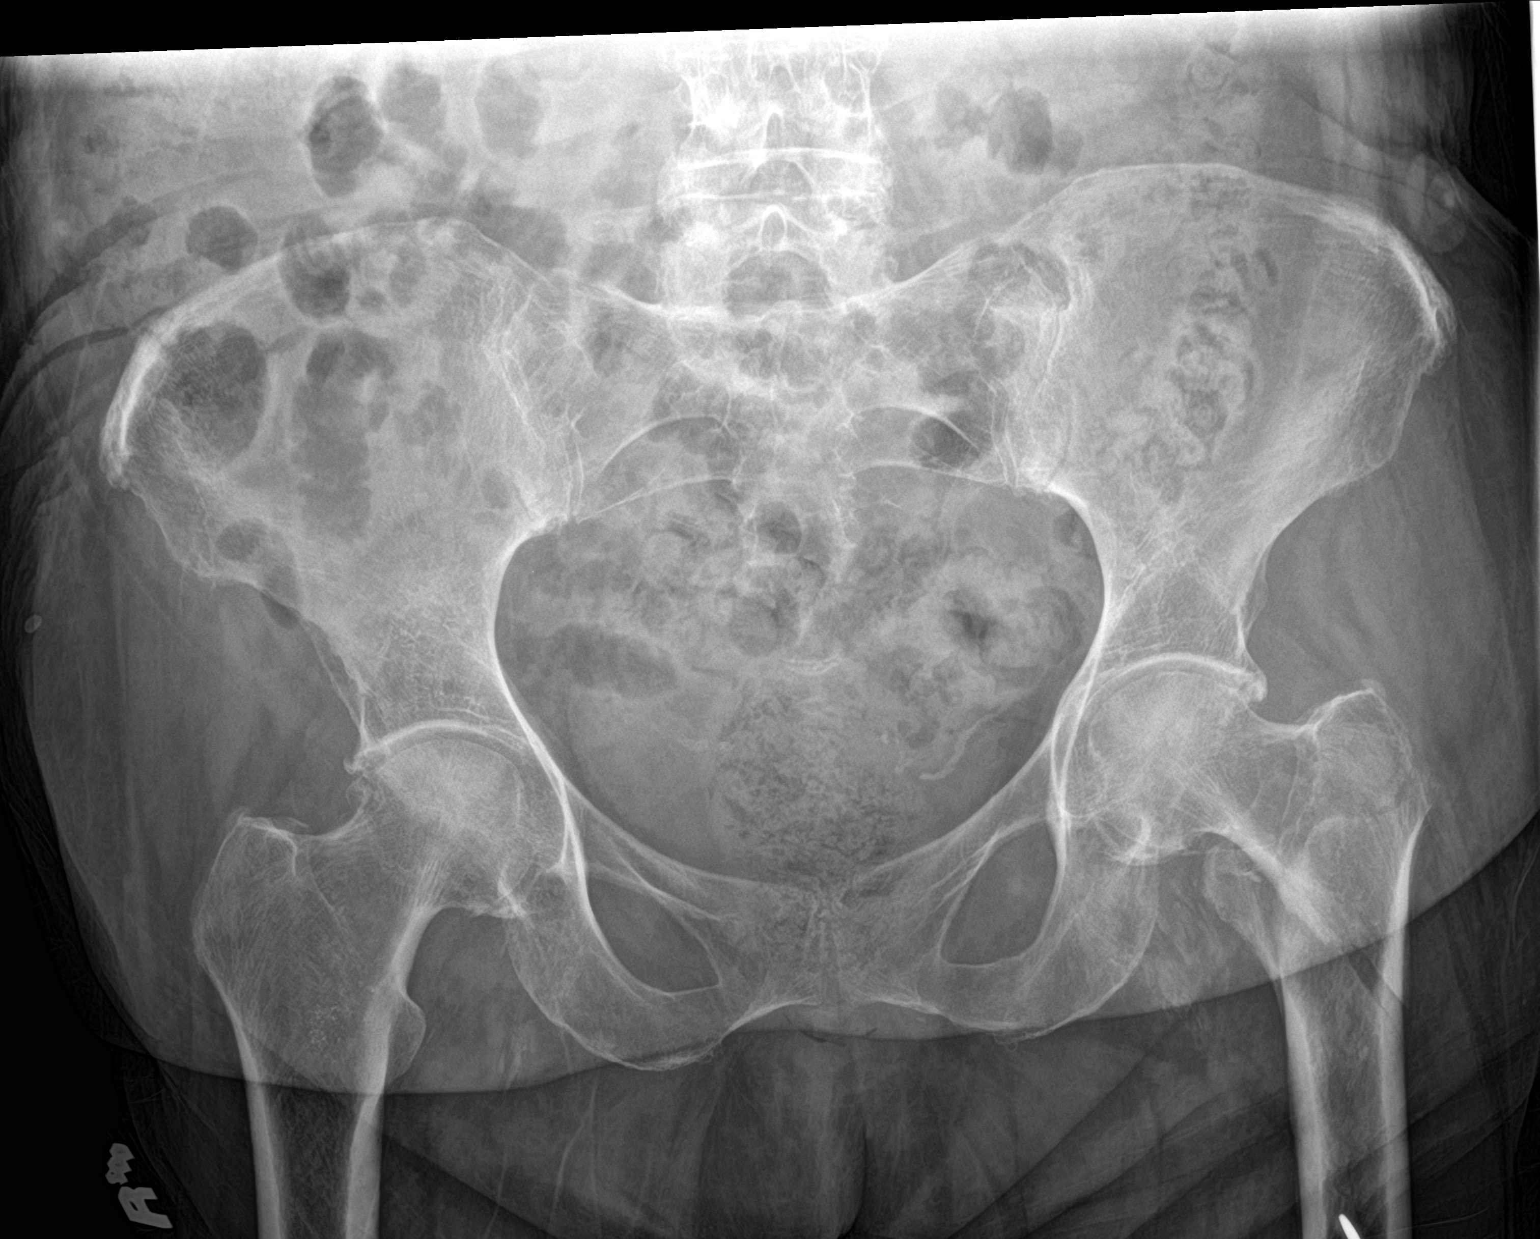

[hip ap]
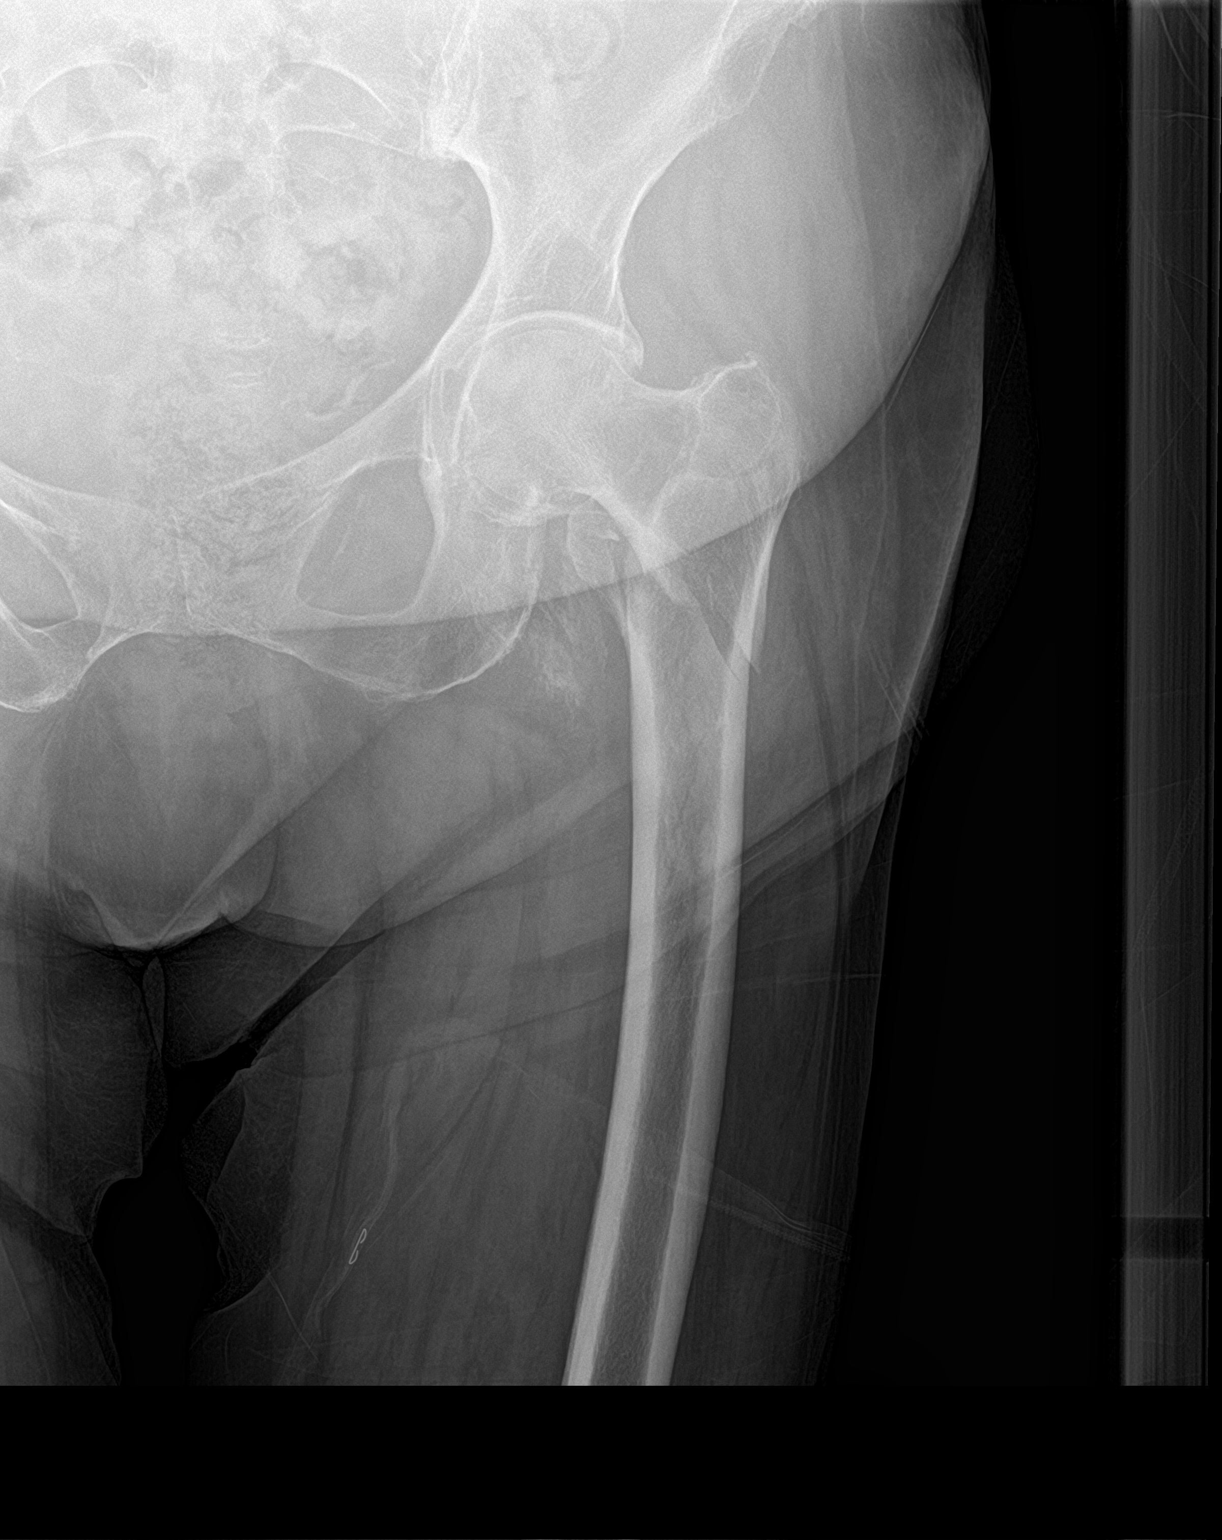

[hip lat]
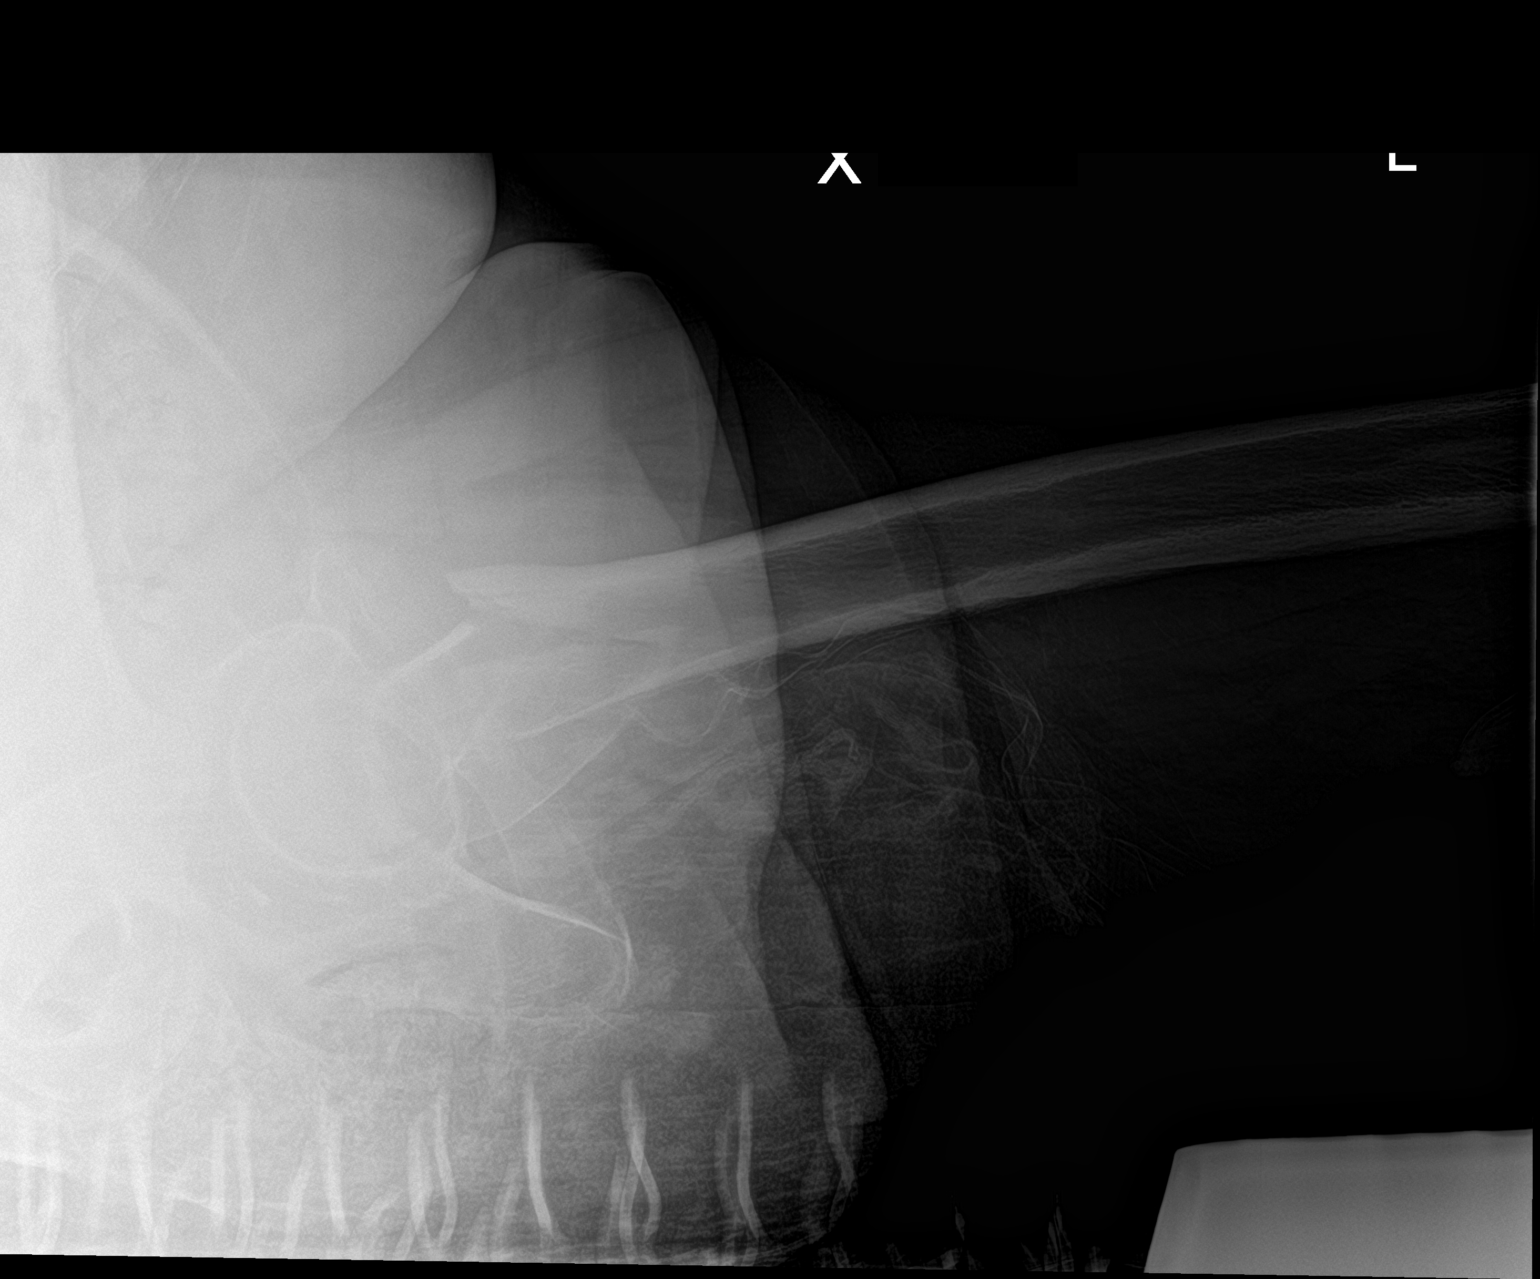

[3 of 3 positions shown; findings below may reference images not displayed]

FINDINGS: There is a comminuted and mildly angulated intertrochanteric
fracture the left femur with extension of the fracture into the
subtrochanteric femur. No dislocation. The bones are osteopenic.
Mild bilateral hip osteoarthritic changes the soft tissues are
unremarkable.
IMPRESSION: Comminuted and mildly angulated left femoral neck fracture.

## 2022-03-06 DIAGNOSIS — M955 Acquired deformity of pelvis: Secondary | ICD-10-CM | POA: Diagnosis not present

## 2022-03-06 DIAGNOSIS — M41125 Adolescent idiopathic scoliosis, thoracolumbar region: Secondary | ICD-10-CM | POA: Diagnosis not present

## 2022-03-06 DIAGNOSIS — M9903 Segmental and somatic dysfunction of lumbar region: Secondary | ICD-10-CM | POA: Diagnosis not present

## 2022-03-06 DIAGNOSIS — M9905 Segmental and somatic dysfunction of pelvic region: Secondary | ICD-10-CM | POA: Diagnosis not present

## 2022-03-22 DIAGNOSIS — M9903 Segmental and somatic dysfunction of lumbar region: Secondary | ICD-10-CM | POA: Diagnosis not present

## 2022-03-22 DIAGNOSIS — M9905 Segmental and somatic dysfunction of pelvic region: Secondary | ICD-10-CM | POA: Diagnosis not present

## 2022-03-22 DIAGNOSIS — M41125 Adolescent idiopathic scoliosis, thoracolumbar region: Secondary | ICD-10-CM | POA: Diagnosis not present

## 2022-03-22 DIAGNOSIS — M955 Acquired deformity of pelvis: Secondary | ICD-10-CM | POA: Diagnosis not present

## 2022-03-27 DIAGNOSIS — M9903 Segmental and somatic dysfunction of lumbar region: Secondary | ICD-10-CM | POA: Diagnosis not present

## 2022-03-27 DIAGNOSIS — M955 Acquired deformity of pelvis: Secondary | ICD-10-CM | POA: Diagnosis not present

## 2022-03-27 DIAGNOSIS — M9905 Segmental and somatic dysfunction of pelvic region: Secondary | ICD-10-CM | POA: Diagnosis not present

## 2022-03-27 DIAGNOSIS — M41125 Adolescent idiopathic scoliosis, thoracolumbar region: Secondary | ICD-10-CM | POA: Diagnosis not present

## 2022-03-30 DIAGNOSIS — M9903 Segmental and somatic dysfunction of lumbar region: Secondary | ICD-10-CM | POA: Diagnosis not present

## 2022-03-30 DIAGNOSIS — M41125 Adolescent idiopathic scoliosis, thoracolumbar region: Secondary | ICD-10-CM | POA: Diagnosis not present

## 2022-03-30 DIAGNOSIS — M955 Acquired deformity of pelvis: Secondary | ICD-10-CM | POA: Diagnosis not present

## 2022-03-30 DIAGNOSIS — M9905 Segmental and somatic dysfunction of pelvic region: Secondary | ICD-10-CM | POA: Diagnosis not present

## 2022-04-04 DIAGNOSIS — M9903 Segmental and somatic dysfunction of lumbar region: Secondary | ICD-10-CM | POA: Diagnosis not present

## 2022-04-04 DIAGNOSIS — M9905 Segmental and somatic dysfunction of pelvic region: Secondary | ICD-10-CM | POA: Diagnosis not present

## 2022-04-04 DIAGNOSIS — M41125 Adolescent idiopathic scoliosis, thoracolumbar region: Secondary | ICD-10-CM | POA: Diagnosis not present

## 2022-04-04 DIAGNOSIS — M955 Acquired deformity of pelvis: Secondary | ICD-10-CM | POA: Diagnosis not present

## 2022-04-04 IMAGING — CR DG LUMBAR SPINE 2-3V
1 series · 3 of 3 positions shown · non-contrast
Comparison: None.

CLINICAL DATA: Low back pain.

EXAM:
LUMBAR SPINE - 2-3 VIEW

[Series 1: dg lumbar spine 2-3 views · 0.14mm/px · 3 of 3 slices shown]
[im 1/3]
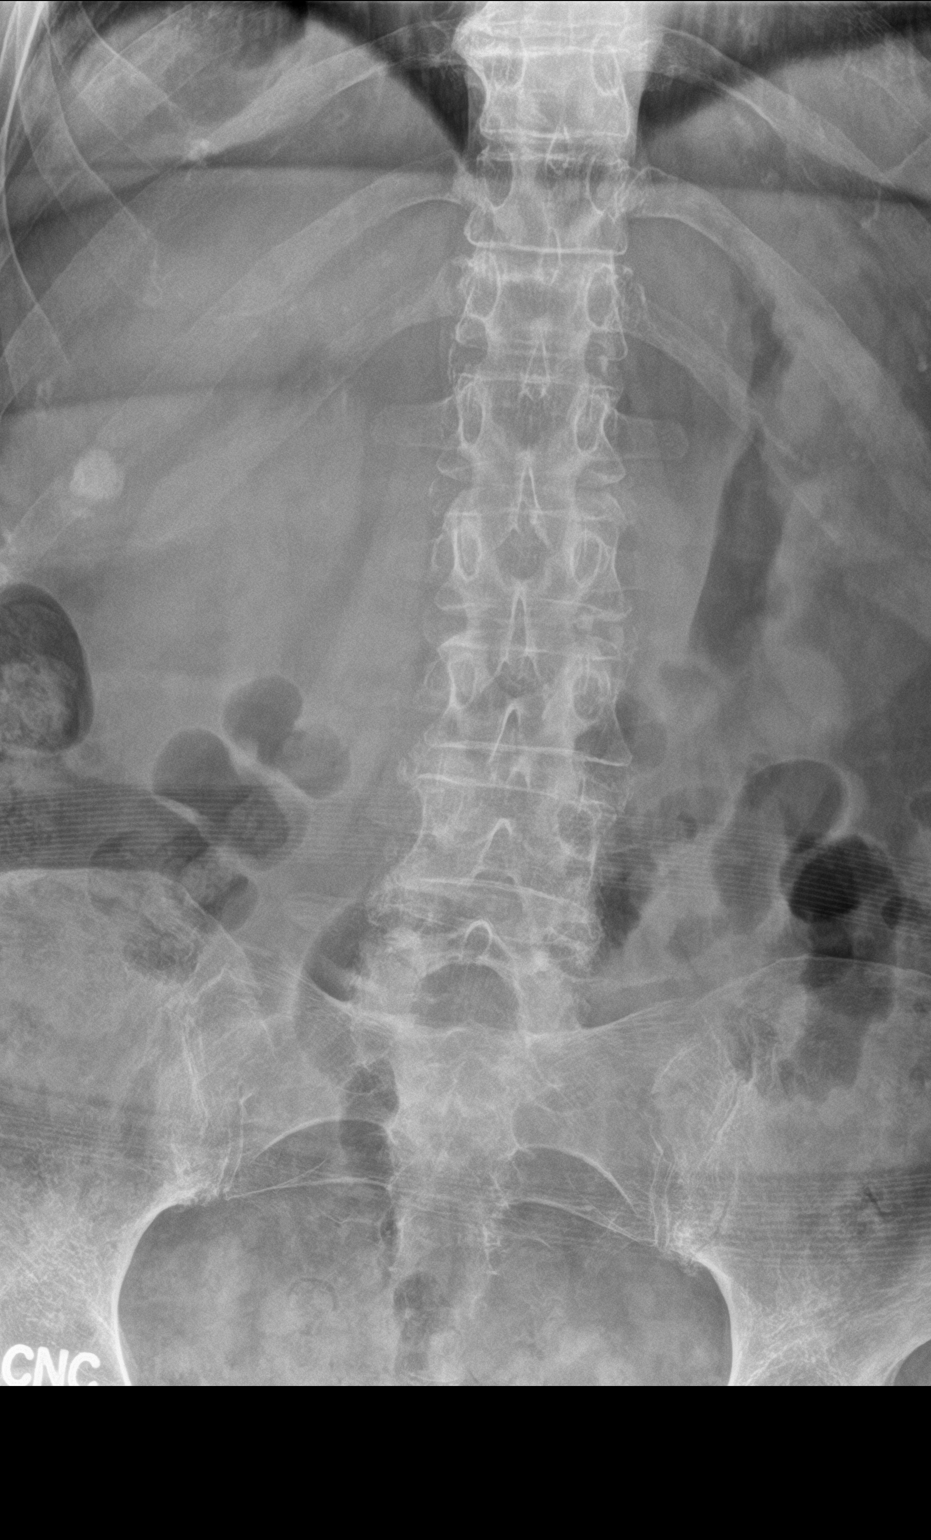
[im 2/3]
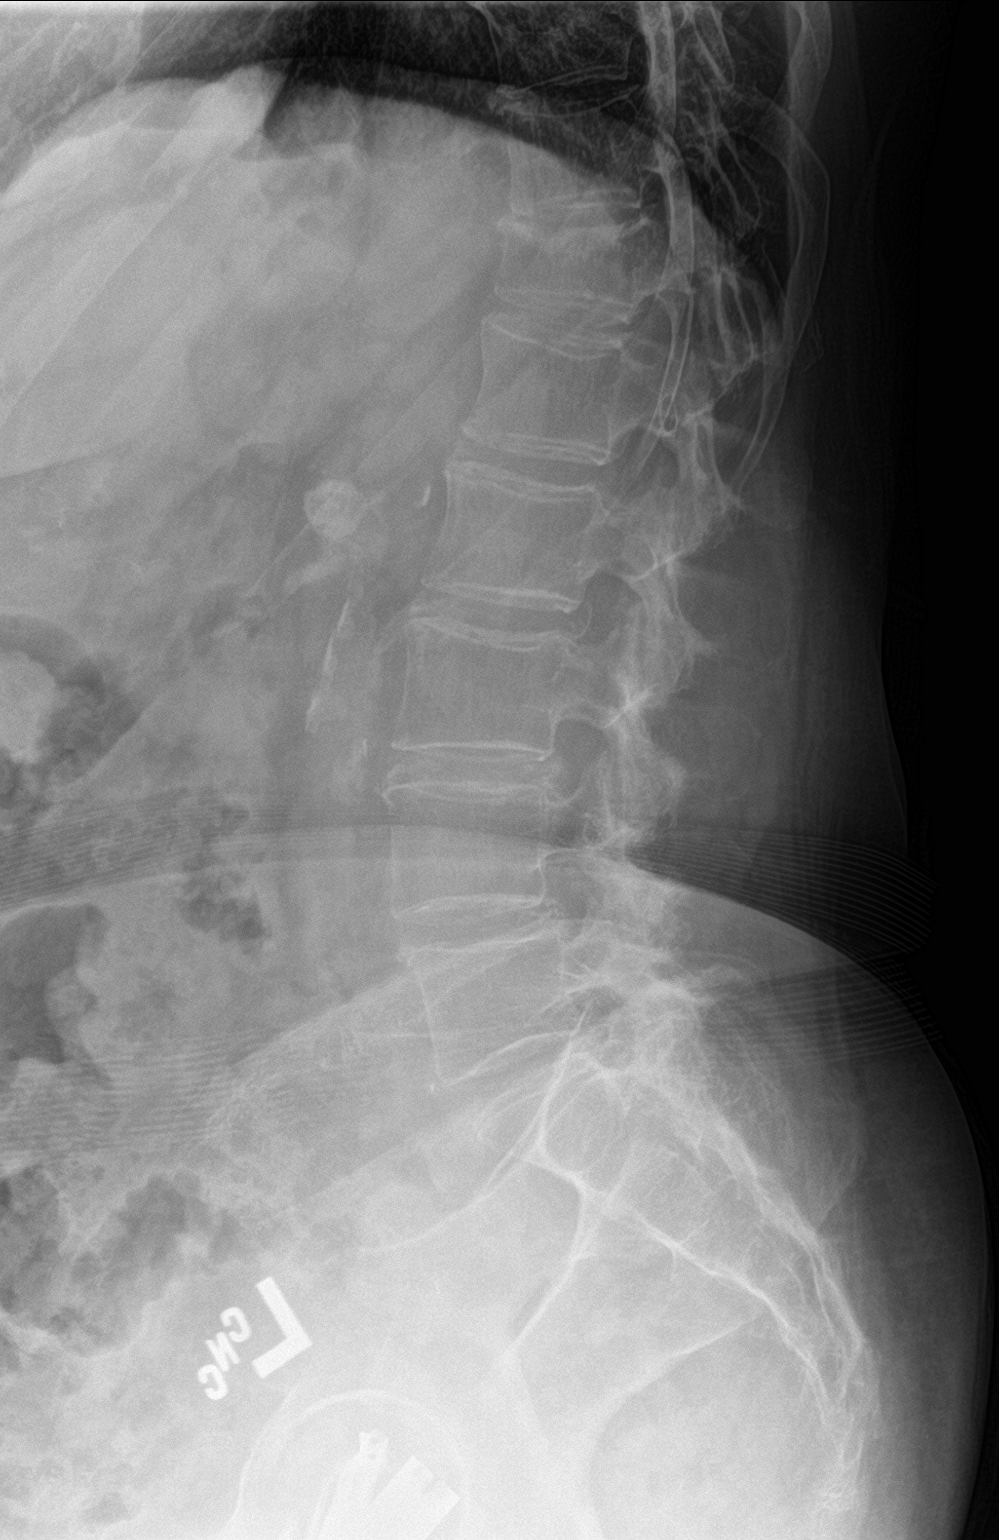
[im 3/3]
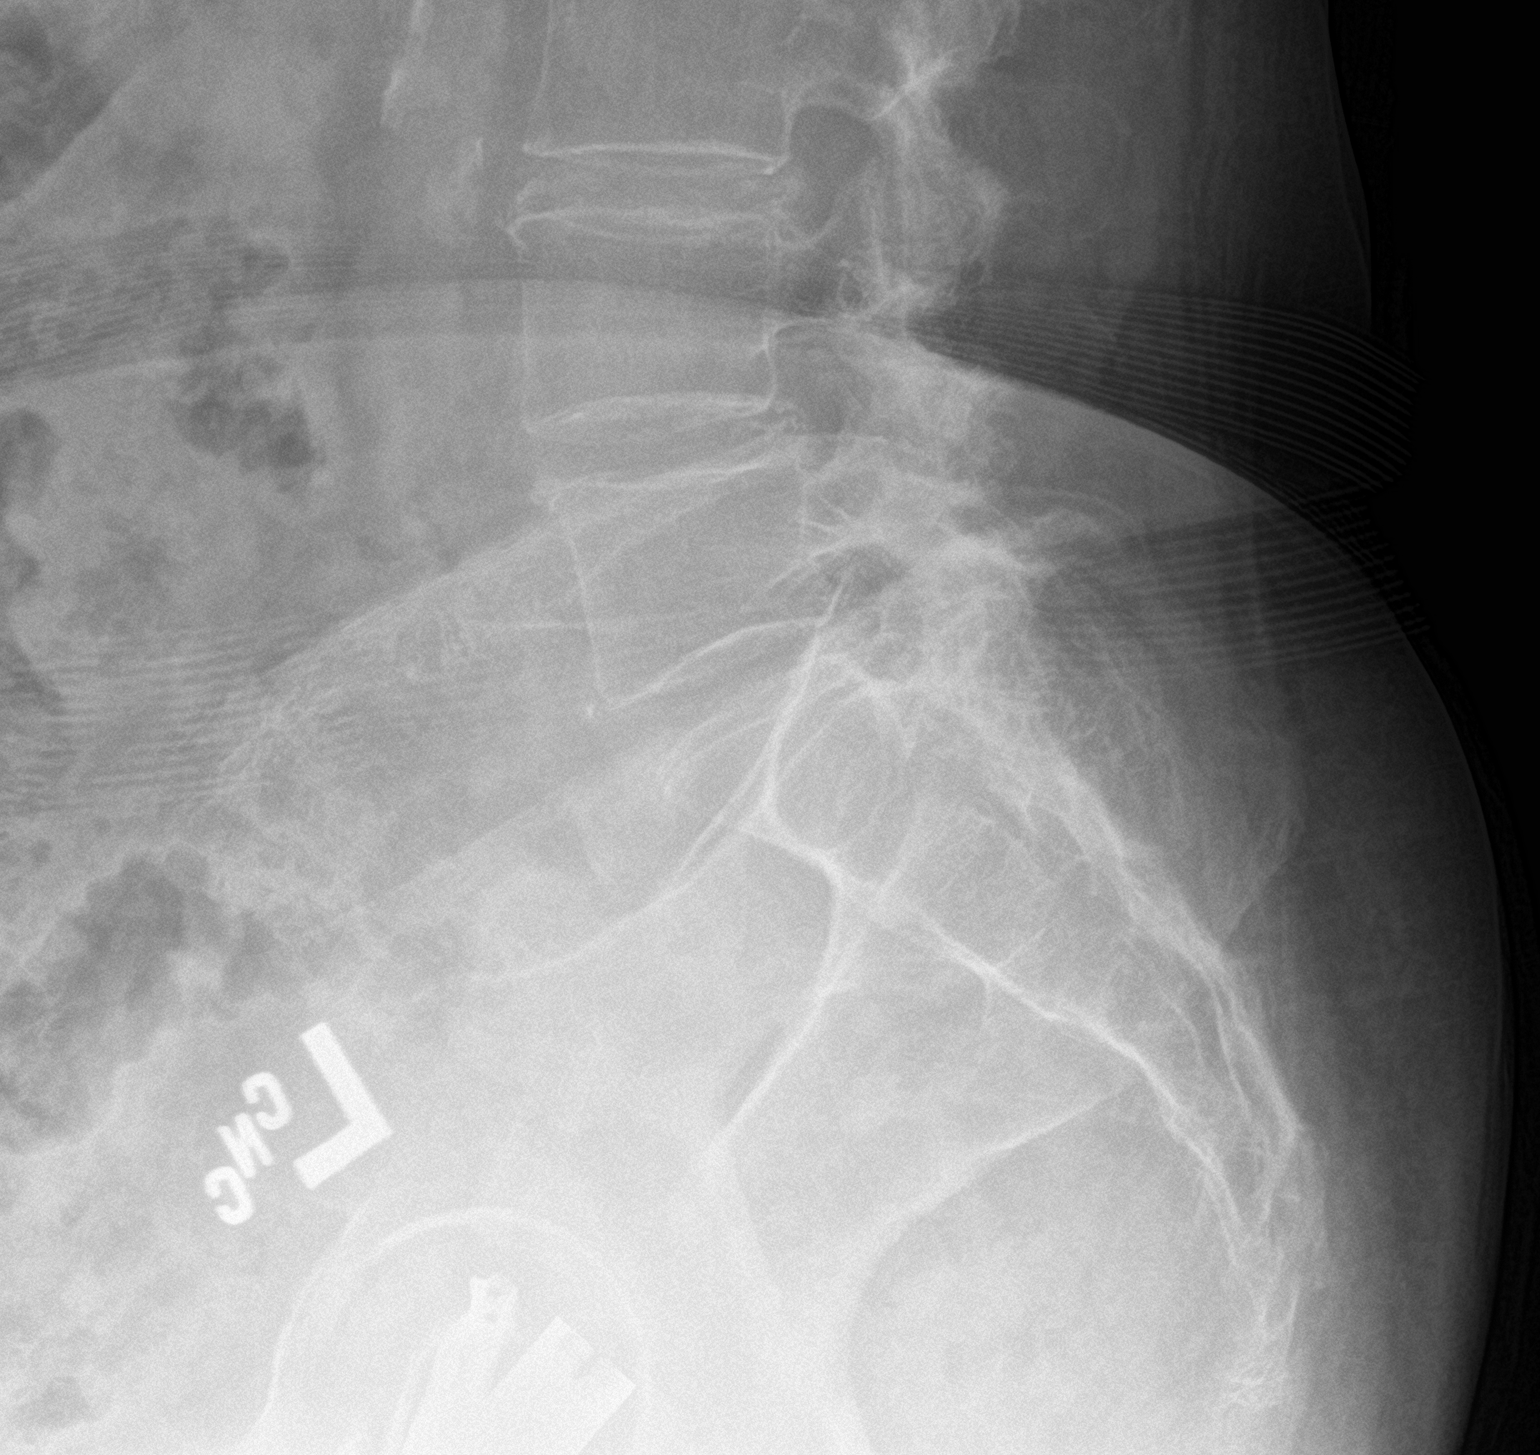

[3 of 3 positions shown; findings below may reference images not displayed]

FINDINGS: No acute fracture or spondylolisthesis is noted. Old T12 fracture is
noted. Mild degenerative disc disease is noted at L1-2, L2-3, L3-4
and L4-5.
IMPRESSION: Mild multilevel degenerative disc disease. No acute abnormality is
noted.

Aortic Atherosclerosis ([id number]).

## 2022-04-05 ENCOUNTER — Ambulatory Visit: Payer: Medicare HMO | Admitting: Internal Medicine

## 2022-04-05 ENCOUNTER — Encounter: Payer: Self-pay | Admitting: Internal Medicine

## 2022-04-05 VITALS — BP 140/76 | HR 68 | Ht 60.0 in | Wt 136.0 lb

## 2022-04-05 DIAGNOSIS — E785 Hyperlipidemia, unspecified: Secondary | ICD-10-CM | POA: Diagnosis not present

## 2022-04-05 DIAGNOSIS — I38 Endocarditis, valve unspecified: Secondary | ICD-10-CM | POA: Diagnosis not present

## 2022-04-05 DIAGNOSIS — I2510 Atherosclerotic heart disease of native coronary artery without angina pectoris: Secondary | ICD-10-CM

## 2022-04-05 DIAGNOSIS — I10 Essential (primary) hypertension: Secondary | ICD-10-CM

## 2022-04-05 NOTE — Patient Instructions (Signed)
Medication Instructions:   Your physician recommends that you continue on your current medications as directed. Please refer to the Current Medication list given to you today.  *If you need a refill on your cardiac medications before your next appointment, please call your pharmacy*   Lab Work:  None ordered  Testing/Procedures:  None ordered   Follow-Up: At [facility], you and your health needs are our priority.  As part of our continuing mission to provide you with exceptional heart care, we have created designated Provider Care Teams.  These Care Teams include your primary Cardiologist (physician) and Advanced Practice Providers (APPs -  Physician Assistants and Nurse Practitioners) who all work together to provide you with the care you need, when you need it.  We recommend signing up for the patient portal called "MyChart".  Sign up information is provided on this After Visit Summary.  MyChart is used to connect with patients for Virtual Visits (Telemedicine).  Patients are able to view lab/test results, encounter notes, upcoming appointments, etc.  Non-urgent messages can be sent to your provider as well.   To learn more about what you can do with MyChart, go to [email or web address].    Your next appointment:   6 month(s)  The format for your next appointment:   In Person  Provider:   You may see Nelva Bush, MD or one of the following Advanced Practice Providers on your designated Care Team:   Murray Hodgkins, NP Christell Faith, PA-C Cadence Kathlen Mody, PA-C{   Important Information About Sugar

## 2022-04-05 NOTE — Progress Notes (Signed)
Follow-up Outpatient Visit Date: 04/05/2022  Primary Care Provider: Tonia Ghent, MD [place] [address]  Chief Complaint: Follow-up valvular heart disease  HPI:  Susan [name] is a 76 y.o. female with history of aortic, mitral, and tricuspid regurgitation, nonobstructive coronary artery disease, hypertension, hyperlipidemia, thyroid disease, NASH, and IBS, who presents for follow-up of valvular heart disease.  I last saw her in February, at which time Susan [name] complained of mild intermittent shortness of breath associated with anxiety.  At times, this was accompanied by chest tightness.  Preceding echo showed normal LVEF with stable valvular findings.  Given aforementioned symptoms and mild pulmonary hypertension on echo, we agreed to add furosemide 20 mg daily as needed weight gain/edema.  We also agreed to perform a pharmacologic myocardial perfusion stress test, which was low risk without evidence of ischemia or scar.  Today, Susan [name] is feeling a little better than at prior visits.  She still has some fatigue that has been present ever since her fall and back injury about 2 years ago.  This is slowly improving.  She feels tired some days and has to nap in the afternoon.  This seems to coincide with flares of her back pain.  She does not snore nor has she been told of any apneic episodes.  She reports having had a sleep study many years ago and is not interested in repeating one at this time.  She denies chest pain and shortness of breath.  Chronic dependent leg edema (left greater than right) is unchanged.  After our last visit, Susan [name] took furosemide for a few days but did not tolerate this due to lightheadedness.  She did not notice much improvement in her edema either.  --------------------------------------------------------------------------------------------------  Cardiovascular History & Procedures: Cardiovascular Problems: Nonobstructive  coronary artery disease Mitral and tricuspid regurgitation   Risk Factors: CAD, hypertension, hyperlipidemia, and age > 73   Cath/PCI: None   CV Surgery: None   EP Procedures and Devices: None   Non-Invasive Evaluation(s): Pharmacologic MPI (02/02/2022): Low risk study without evidence of ischemia or scar.  LVEF greater than 65%.  Coronary artery calcification and aortic atherosclerosis noted. TTE (12/23/2021): Normal LV size and wall thickness.  LVEF 60-65% with grade 1 diastolic dysfunction.  Normal RV size and function.  Mild pulmonary hypertension (RVSP 38 mmHg).  Normal biatrial size.  Mild-moderate aortic regurgitation without stenosis.  Normal CVP. TTE (06/08/2020): Normal LV size and function (EF 60-65%) with grade 1 diastolic dysfunction.  Mild RVH with normal size and contraction.  Mild pulmonary hypertension.  Trivial mitral regurgitation.  Moderate to severe tricuspid regurgitation.  Mild to moderate aortic regurgitation.  Normal CVP. TTE (10/16/2018, Dr. Humphrey Rolls): Normal LV size with mild LVH.  LVEF 70-75%.  Mild to moderate mitral and moderate to severe tricuspid regurgitation.  Moderate aortic regurgitation. Mild to moderate pulmonary hypertension.  RV size and function.    Trivial pericardial effusion. Carotid Doppler (10/16/2018, Dr. Humphrey Rolls): Protruding plaque in both carotid bulbs without obstruction.  Increased in right mid ICA felt to be due to tortuosity. Cardiac CTA (09/14/15, Dr. Humphrey Rolls): CAC score 74.  Mild proximal LAD disease with haziness.  LCx and RCA are normal  Recent CV Pertinent Labs: Lab Results  Component Value Date   CHOL 160 11/10/2021   CHOL 169 10/17/2018   HDL 51.80 11/10/2021   LDLCALC 86 11/10/2021   LDLCALC 102 10/17/2018   TRIG 112.0 11/10/2021   TRIG 112 10/17/2018  CHOLHDL 3 11/10/2021   INR 1.1 11/13/2020   K 3.8 11/10/2021   MG 2.0 11/17/2020   BUN 26 (H) 11/10/2021   BUN 17 06/09/2020   CREATININE 0.64 11/10/2021   CREATININE 0.83  10/17/2018   CREATININE 0.77 01/12/2012    Past medical and surgical history were reviewed and updated in EPIC.  Current Meds  Medication Sig   acetaminophen (TYLENOL) 500 MG tablet Take 750 mg by mouth 2 (two) times daily. Takes QOD (alternates with Advil)   aspirin 81 MG tablet Take 81 mg by mouth daily.   baclofen (LIORESAL) 10 MG tablet Take 1 tablet (10 mg total) by mouth 2 (two) times daily as needed for muscle spasms.   Cholecalciferol 1000 units tablet Take 2 tablets (2,000 Units total) by mouth daily.   ezetimibe (ZETIA) 10 MG tablet TAKE 1 TABLET BY MOUTH DAILY.   ibuprofen (ADVIL) 200 MG tablet Take 400 mg by mouth 2 (two) times daily. Alternates QOD with acetaminophen   lisinopril-hydrochlorothiazide (ZESTORETIC) 20-25 MG tablet TAKE 1 TABLET EVERY DAY   vitamin B-12 (CYANOCOBALAMIN) 1000 MCG tablet Take 1,000 mcg by mouth daily.    Allergies: Lipitor [atorvastatin calcium], Penicillins, and Sulfonamide derivatives  Social History   Tobacco Use   Smoking status: Former    Types: Cigarettes    Quit date: 03/19/2001    Years since quitting: 21.0   Smokeless tobacco: Never   Tobacco comments:    Quit in 2001  Vaping Use   Vaping Use: Never used  Substance Use Topics   Alcohol use: Yes    Comment: 1 glass of wine per month or 1/2 beer   Drug use: No    Family History  Problem Relation Age of Onset   Depression Mother        Manic depression   Diabetes Mother        Type 2   Breast cancer Mother    Heart disease Mother    Colon polyps Father    Heart attack Father 12   Breast cancer Maternal Aunt    Breast cancer Paternal Aunt    Crohn's disease Brother    Osteoporosis Brother    Cancer Neg Hx    Colon cancer Neg Hx     Review of Systems: Susan [name] notes occasional tingling and discomfort beginning in the fingertips of her right hand that radiates up to the forearm.  This seems to go along with her hand being cold.  She wonders if it could be related  to a childhood injury to the hand.  Otherwise, a 12-system review of systems was performed and was negative except as noted in the HPI.  --------------------------------------------------------------------------------------------------  Physical Exam: BP 140/76 (BP Location: Left Arm, Patient Position: Sitting, Cuff Size: Normal)   Pulse 68   Ht 5' (1.524 m)   Wt 136 lb (61.7 kg)   SpO2 98%   BMI 26.56 kg/m   General:  NAD. Neck: No JVD or HJR. Lungs: Clear to auscultation bilaterally without wheezes or crackles. Heart: Regular rate and rhythm with 2/6 systolic murmur.  No rubs or gallops. Abdomen: Soft, nontender, nondistended. Extremities: Trace pretibial edema bilaterally.  Lab Results  Component Value Date   WBC 6.0 11/10/2021   HGB 12.8 11/10/2021   HCT 38.4 11/10/2021   MCV 92.4 11/10/2021   PLT 198.0 11/10/2021    Lab Results  Component Value Date   NA 140 11/10/2021   K 3.8 11/10/2021   CL 103 11/10/2021  CO2 29 11/10/2021   BUN 26 (H) 11/10/2021   CREATININE 0.64 11/10/2021   GLUCOSE 95 11/10/2021   ALT 16 11/10/2021    Lab Results  Component Value Date   CHOL 160 11/10/2021   HDL 51.80 11/10/2021   LDLCALC 86 11/10/2021   TRIG 112.0 11/10/2021   CHOLHDL 3 11/10/2021    --------------------------------------------------------------------------------------------------  ASSESSMENT AND PLAN: Valvular heart disease: Symptoms are stable to slightly improved.  Only mild dependent edema noted in both legs, with some contribution of venous insufficiency suspected.  Susan [name] did not tolerate furosemide well due to lightheadedness.  I think it is reasonable to forego continued furosemide treatment.  Continue lisinopril-HCTZ for blood pressure control (blood pressure mildly elevated today but typically better at home).  Echocardiogram last year showed stable multi valvular disease.  Nonobstructive coronary artery disease: Prior CTA showed nonobstructive  CAD.  Stress test in March was reassuring without evidence of ischemia or scar.  Aspirin and ezetimibe to prevent progression of disease given history of statin intolerance.  Hypertension: Blood pressure mildly elevated today but typically better at home.  Continue current regimen of lisinopril-HCTZ.  Hyperlipidemia: Continue ezetimibe, with most recent LDL being 86 in January.  Lifestyle modifications encouraged.  Follow-up: Return to clinic in 6 months.  Nelva Bush, MD 04/05/2022 10:47 AM

## 2022-04-07 ENCOUNTER — Telehealth: Payer: Self-pay

## 2022-04-07 NOTE — Progress Notes (Signed)
    Chronic Care Management Pharmacy Assistant   Name: Susan [name]  MRN: [id number] DOB: Mar 24, 1946  Reason for Encounter: CCM (General Adherence)  Recent office visits:  None since last CCM contact  Recent consult visits:  04/05/22 Nelva Bush, MD (Cardiology): Valvular heart disease Change: Acetaminophen 750 mg daily Stop (patient): Furosemide 20 mg FU 6 months 02/06/22 Iris Pert (Chiropractic) Segmental and somatic dysfunction of pelvic region 02/02/22 NM MYO MULT SPECT W/WALL 1DAY 01/16/22 Deloria Lair, MD (Ophthalmology): [facility] visits:  None in previous 6 months  Medications: Outpatient Encounter Medications as of 04/07/2022  Medication Sig   acetaminophen (TYLENOL) 500 MG tablet Take 750 mg by mouth 2 (two) times daily. Takes QOD (alternates with Advil)   aspirin 81 MG tablet Take 81 mg by mouth daily.   baclofen (LIORESAL) 10 MG tablet Take 1 tablet (10 mg total) by mouth 2 (two) times daily as needed for muscle spasms.   Cholecalciferol 1000 units tablet Take 2 tablets (2,000 Units total) by mouth daily.   ezetimibe (ZETIA) 10 MG tablet TAKE 1 TABLET BY MOUTH DAILY.   ibuprofen (ADVIL) 200 MG tablet Take 400 mg by mouth 2 (two) times daily. Alternates QOD with acetaminophen   lisinopril-hydrochlorothiazide (ZESTORETIC) 20-25 MG tablet TAKE 1 TABLET EVERY DAY   vitamin B-12 (CYANOCOBALAMIN) 1000 MCG tablet Take 1,000 mcg by mouth daily.   No facility-administered encounter medications on file as of 04/07/2022.   Contacted Susan [name] on 04/11/2022 for general disease state and medication adherence call.   Patient is more than 5 days past due for refill on the following medications per chart history:  [facility] Medications: Medication Name  Last Fill Days Supply Lisinopril-HCTZ 20-25 mg 12/25/21 90 Fill date verified with [facility]  What concerns do you have about your medications? No concerns   The patient denies side effects with  their medications.   How often do you forget or accidentally miss a dose? Never  Do you use a pillbox? No  Are you having any problems getting your medications from your pharmacy? No  Has the cost of your medications been a concern? No  Since last visit with CPP, the following interventions have been made.  Change: Acetaminophen 750 mg daily Stop (patient): Furosemide 20 mg FU 6 months  The patient has not had an ED visit since last contact.   The patient reports the following problems with their health. Patient states she has been having tingling in her fingertips. Patient did discuss it with Cardiology at her last appointment. Cardiology was not concerned.   Patient denies concerns or questions for Charlene Brooke, PharmD at this time.   Care Gaps: Annual wellness visit in last year? Yes 11/11/2021 Most Recent BP reading: 140/76 on 04/05/2022  Upcoming appointments: CCM appointment on 07/13/2022  Charlene Brooke, CPP notified  Susan [name], [place] Assistant [phone]

## 2022-04-10 DIAGNOSIS — M955 Acquired deformity of pelvis: Secondary | ICD-10-CM | POA: Diagnosis not present

## 2022-04-10 DIAGNOSIS — M9905 Segmental and somatic dysfunction of pelvic region: Secondary | ICD-10-CM | POA: Diagnosis not present

## 2022-04-10 DIAGNOSIS — M9903 Segmental and somatic dysfunction of lumbar region: Secondary | ICD-10-CM | POA: Diagnosis not present

## 2022-04-10 DIAGNOSIS — M41125 Adolescent idiopathic scoliosis, thoracolumbar region: Secondary | ICD-10-CM | POA: Diagnosis not present

## 2022-04-24 DIAGNOSIS — M41125 Adolescent idiopathic scoliosis, thoracolumbar region: Secondary | ICD-10-CM | POA: Diagnosis not present

## 2022-04-24 DIAGNOSIS — M955 Acquired deformity of pelvis: Secondary | ICD-10-CM | POA: Diagnosis not present

## 2022-04-24 DIAGNOSIS — M9903 Segmental and somatic dysfunction of lumbar region: Secondary | ICD-10-CM | POA: Diagnosis not present

## 2022-04-24 DIAGNOSIS — M9905 Segmental and somatic dysfunction of pelvic region: Secondary | ICD-10-CM | POA: Diagnosis not present

## 2022-04-26 NOTE — Progress Notes (Signed)
Patient stated she is taking the Ezetimibe and Lisinopril-HCTZ daily with no missed doses. Patient stated that [facility] has been sending her too much medication and that is why she has so many tablets remaining. I had patient do a pill count on both medications during the encounter.   Ezetimibe was last filled on 12/26/2021 90ds - 0 refills (verified with [facility]).   According to last fill date patient should only have 5 tablets remaining and should run out 05/01/2022. Patient did a pill count and has 90 (whole unopened vial) and 96 (in another vial). Patient has a total of 186 tablets remaining. Counseled patient to take medication as directed; 1 tablet daily. Advised patient she should run out of medication on October 29, 2022 if taken daily.   Lisinopril-HCTZ was last filled on 12/26/2021 90ds - 0 refills (verified with [facility]).  According to last fill date patient should only have 5 tablets remaining and should run out 05/01/2022. Patient did a pill count and has 64 (in one vial) and 144 (in another vial). Patient has a total of 208 tablets remaining. Advised patient she should run out of medication on November 18, 2022 if taken daily.   Susan [name], CPP notified  Susan [name], [place] Clinical Pharmacy Assistant [phone]

## 2022-04-28 ENCOUNTER — Telehealth: Payer: Self-pay | Admitting: Family Medicine

## 2022-04-30 NOTE — Telephone Encounter (Signed)
Please check with patient and talk to me about this.  She had vitamin D level that was normal relatively recently.  I did not put any follow-up orders for labs yet.

## 2022-05-01 ENCOUNTER — Other Ambulatory Visit: Payer: Medicare HMO

## 2022-05-01 NOTE — Telephone Encounter (Signed)
Patient called back, gave message below and Liset mistaken the information on AVS from last visit and thought she had to come in every 3 months to have it checked. Did cancel appointment at [facility].

## 2022-05-08 DIAGNOSIS — M41125 Adolescent idiopathic scoliosis, thoracolumbar region: Secondary | ICD-10-CM | POA: Diagnosis not present

## 2022-05-08 DIAGNOSIS — M9903 Segmental and somatic dysfunction of lumbar region: Secondary | ICD-10-CM | POA: Diagnosis not present

## 2022-05-08 DIAGNOSIS — M955 Acquired deformity of pelvis: Secondary | ICD-10-CM | POA: Diagnosis not present

## 2022-05-08 DIAGNOSIS — M9905 Segmental and somatic dysfunction of pelvic region: Secondary | ICD-10-CM | POA: Diagnosis not present

## 2022-05-12 ENCOUNTER — Ambulatory Visit (INDEPENDENT_AMBULATORY_CARE_PROVIDER_SITE_OTHER): Payer: Medicare HMO | Admitting: Family Medicine

## 2022-05-12 ENCOUNTER — Encounter: Payer: Self-pay | Admitting: Family Medicine

## 2022-05-12 VITALS — BP 112/70 | HR 92 | Temp 97.6°F | Ht 60.0 in | Wt 135.0 lb

## 2022-05-12 DIAGNOSIS — M810 Age-related osteoporosis without current pathological fracture: Secondary | ICD-10-CM | POA: Diagnosis not present

## 2022-05-12 LAB — BASIC METABOLIC PANEL
BUN: 29 mg/dL — ABNORMAL HIGH (ref 6–23)
CO2: 31 mEq/L (ref 19–32)
Calcium: 9.9 mg/dL (ref 8.4–10.5)
Chloride: 103 mEq/L (ref 96–112)
Creatinine, Ser: 0.72 mg/dL (ref 0.40–1.20)
GFR: 80.96 mL/min (ref >60.00)
Glucose, Bld: 96 mg/dL (ref 70–99)
Potassium: 4.3 mEq/L (ref 3.5–5.1)
Sodium: 141 mEq/L (ref 135–145)

## 2022-05-12 LAB — VITAMIN D 25 HYDROXY (VIT D DEFICIENCY, FRACTURES): VITD: 35.54 ng/mL (ref 30.00–100.00)

## 2022-05-12 NOTE — Progress Notes (Unsigned)
S/p 5 years of fosamax. We prev talked about tx with prolia.  Minimally low vit D d/w pt.   would replete vitamin D before doing anything else.  Increase vitamin D to 2000 units a day and recheck a vitamin D level in a few months.  See after visit summary.  She was worried about the risk of pain related to Prolia.  Per published reports, there is approximately a 5-10% incidence of pain with prolia.  Discussed routine Prolia cautions in general, related to osteonecrosis and atypical long bone fractures.    She is walking with cane at baseline.  That helps her gait.     She opts for prolia.  She needs labs today then set up med with staff here.

## 2022-05-12 NOTE — Patient Instructions (Signed)
Go to the lab on the way out.   If you have mychart we'll likely use that to update you.    Let me check on getting prolia set up.  Take care.  Glad to see you.

## 2022-05-14 NOTE — Assessment & Plan Note (Signed)
She opts for prolia.  We talked about getting her labs done today and then I will update staff about proceeding.

## 2022-05-15 DIAGNOSIS — H353131 Nonexudative age-related macular degeneration, bilateral, early dry stage: Secondary | ICD-10-CM | POA: Diagnosis not present

## 2022-05-15 DIAGNOSIS — H524 Presbyopia: Secondary | ICD-10-CM | POA: Diagnosis not present

## 2022-05-18 ENCOUNTER — Telehealth: Payer: Self-pay

## 2022-05-18 NOTE — Telephone Encounter (Signed)
New start. Benefits submitted-waiting for decision.

## 2022-05-22 DIAGNOSIS — M955 Acquired deformity of pelvis: Secondary | ICD-10-CM | POA: Diagnosis not present

## 2022-05-22 DIAGNOSIS — M9903 Segmental and somatic dysfunction of lumbar region: Secondary | ICD-10-CM | POA: Diagnosis not present

## 2022-05-22 DIAGNOSIS — M41125 Adolescent idiopathic scoliosis, thoracolumbar region: Secondary | ICD-10-CM | POA: Diagnosis not present

## 2022-05-22 DIAGNOSIS — M9905 Segmental and somatic dysfunction of pelvic region: Secondary | ICD-10-CM | POA: Diagnosis not present

## 2022-05-24 ENCOUNTER — Telehealth: Payer: Self-pay | Admitting: Family Medicine

## 2022-05-24 NOTE — Telephone Encounter (Signed)
Benefits received. OOP cost is $315 and PA is needed. Labs checked already on 05/12/22. Patient advised. Patient will call back to schedule after looking at her daughters schedule. CrCl is 64.22 mL/min Calcium 9.9 normal

## 2022-05-24 NOTE — Telephone Encounter (Signed)
Patient called in regards to a Prolia shot and possibly getting it scheduled tomorrow.

## 2022-05-24 NOTE — Telephone Encounter (Signed)
Patient advised and scheduled for Nurse visit on 05/25/22-see other phone note re prolia that was started already.

## 2022-05-25 ENCOUNTER — Other Ambulatory Visit: Payer: Self-pay | Admitting: Family Medicine

## 2022-05-25 ENCOUNTER — Ambulatory Visit (INDEPENDENT_AMBULATORY_CARE_PROVIDER_SITE_OTHER): Payer: Medicare HMO | Admitting: *Deleted

## 2022-05-25 DIAGNOSIS — M810 Age-related osteoporosis without current pathological fracture: Secondary | ICD-10-CM

## 2022-05-25 MED ORDER — DENOSUMAB 60 MG/ML ~~LOC~~ SOSY
60.0000 mg | PREFILLED_SYRINGE | Freq: Once | SUBCUTANEOUS | Status: AC
  Administered 2022-05-25: 60 mg via SUBCUTANEOUS

## 2022-05-25 NOTE — Telephone Encounter (Signed)
PA Approved, Coverage Starts on: 05/24/2022 12:00:00 AM, Coverage Ends on: 11/05/2022 1

## 2022-05-25 NOTE — Progress Notes (Signed)
Per orders of Dr. Damita Dunnings, injection of Prolia given by Lauralyn Primes. Patient tolerated injection well.

## 2022-05-27 ENCOUNTER — Other Ambulatory Visit: Payer: Self-pay | Admitting: Family Medicine

## 2022-05-29 DIAGNOSIS — M9905 Segmental and somatic dysfunction of pelvic region: Secondary | ICD-10-CM | POA: Diagnosis not present

## 2022-05-29 DIAGNOSIS — M41125 Adolescent idiopathic scoliosis, thoracolumbar region: Secondary | ICD-10-CM | POA: Diagnosis not present

## 2022-05-29 DIAGNOSIS — M955 Acquired deformity of pelvis: Secondary | ICD-10-CM | POA: Diagnosis not present

## 2022-05-29 DIAGNOSIS — M9903 Segmental and somatic dysfunction of lumbar region: Secondary | ICD-10-CM | POA: Diagnosis not present

## 2022-06-05 DIAGNOSIS — M41125 Adolescent idiopathic scoliosis, thoracolumbar region: Secondary | ICD-10-CM | POA: Diagnosis not present

## 2022-06-05 DIAGNOSIS — M9903 Segmental and somatic dysfunction of lumbar region: Secondary | ICD-10-CM | POA: Diagnosis not present

## 2022-06-05 DIAGNOSIS — M9905 Segmental and somatic dysfunction of pelvic region: Secondary | ICD-10-CM | POA: Diagnosis not present

## 2022-06-05 DIAGNOSIS — M955 Acquired deformity of pelvis: Secondary | ICD-10-CM | POA: Diagnosis not present

## 2022-06-19 ENCOUNTER — Ambulatory Visit (INDEPENDENT_AMBULATORY_CARE_PROVIDER_SITE_OTHER): Payer: Medicare HMO | Admitting: Ophthalmology

## 2022-06-19 ENCOUNTER — Encounter (INDEPENDENT_AMBULATORY_CARE_PROVIDER_SITE_OTHER): Payer: Self-pay | Admitting: Ophthalmology

## 2022-06-19 DIAGNOSIS — H353123 Nonexudative age-related macular degeneration, left eye, advanced atrophic without subfoveal involvement: Secondary | ICD-10-CM

## 2022-06-19 DIAGNOSIS — H353114 Nonexudative age-related macular degeneration, right eye, advanced atrophic with subfoveal involvement: Secondary | ICD-10-CM | POA: Diagnosis not present

## 2022-06-19 DIAGNOSIS — H353221 Exudative age-related macular degeneration, left eye, with active choroidal neovascularization: Secondary | ICD-10-CM

## 2022-06-19 NOTE — Assessment & Plan Note (Signed)
The nature of dry age related macular degeneration was discussed with the patient as well as its possible conversion to wet. The results of the AREDS 2 study was discussed with the patient. A diet rich in dark leafy green vegetables was advised and specific recommendations were made regarding supplements with AREDS 2 formulation . Control of hypertension and serum cholesterol may slow the disease. Smoking cessation is mandatory to slow the disease and diminish the risk of progressing to wet age related macular degeneration. The patient was instructed in the use of an [place] and was told to return immediately for any changes in the Grid. Stressed to the patient do not rub eyes  Two forms of dry ARMD exist, one with drusen deposits, yellowish deposits with no obvious atrophy,  ATROPHIC which is the loss of normal blood supply to the choroid (the orange or copper green color layer of the outer retina), which is seen as large areas of atrophy which grow over time to decrease vision.  This atrophic form of dry ARMD is treatable as of Fall 2023.  The treatment consists of injection into the affected eye monthly at first, in attempt to slow the growth of the atrophy. The first approved medication is SYFOVRE (pegcetacoplan).  Subfoveal involvement of dry AMD

## 2022-06-19 NOTE — Progress Notes (Signed)
06/19/2022     CHIEF COMPLAINT Patient presents for  Chief Complaint  Patient presents with   Macular Degeneration      HISTORY OF PRESENT ILLNESS: Susan [name] is a 76 y.o. female who presents to the clinic today for:   HPI   5 mths dilate ou color fp oct Pt states her vision has not been stable  Pt denies any new floaters or FOL Pt states " I just cannot read my vision is very blurry" Last edited by Morene Rankins, CMA on 06/19/2022 11:21 AM.      Referring physician: Tonia Ghent, MD [place],  [place] [address]  HISTORICAL INFORMATION:   Selected notes from the [facility]    Lab Results  Component Value Date   HGBA1C 5.5 11/14/2020     CURRENT MEDICATIONS: No current outpatient medications on file. (Ophthalmic Drugs)   No current facility-administered medications for this visit. (Ophthalmic Drugs)   Current Outpatient Medications (Other)  Medication Sig   acetaminophen (TYLENOL) 500 MG tablet Take 750 mg by mouth 2 (two) times daily. Takes QOD (alternates with Advil)   aspirin 81 MG tablet Take 81 mg by mouth daily.   baclofen (LIORESAL) 10 MG tablet TAKE 1 TABLET BY MOUTH TWICE DAILY AS NEEDED FOR MUSCLE SPASM   Cholecalciferol 1000 units tablet Take 2 tablets (2,000 Units total) by mouth daily.   ezetimibe (ZETIA) 10 MG tablet TAKE 1 TABLET BY MOUTH DAILY.   ibuprofen (ADVIL) 200 MG tablet Take 400 mg by mouth 2 (two) times daily. Alternates QOD with acetaminophen   lisinopril-hydrochlorothiazide (ZESTORETIC) 20-25 MG tablet TAKE 1 TABLET EVERY DAY   vitamin B-12 (CYANOCOBALAMIN) 1000 MCG tablet Take 1,000 mcg by mouth daily.   No current facility-administered medications for this visit. (Other)      REVIEW OF SYSTEMS: ROS   Negative for: Constitutional, Gastrointestinal, Neurological, Skin, Genitourinary, Musculoskeletal, HENT, Endocrine, Cardiovascular, Eyes, Respiratory, Psychiatric, Allergic/Imm,  Heme/Lymph Last edited by Hurman Horn, MD on 06/19/2022 11:56 AM.       ALLERGIES Allergies  Allergen Reactions   Lipitor [Atorvastatin Calcium] Other (See Comments)    Aches on med at 20m a day.    Penicillins Swelling and Rash    Rash and "swelling" in high school. Tolerated cefazolin 11/14/2020    Sulfonamide Derivatives Rash    PAST MEDICAL HISTORY Past Medical History:  Diagnosis Date   Colon cancer screening    Diverticulosis of colon    Exudative age-related macular degeneration of right eye with active choroidal neovascularization [place] 03/23/[place]   Hyperlipidemia    Hypertension    IBS (irritable bowel syndrome)    Macular degeneration    Migraine, unspecified, without mention of intractable migraine without mention of status migrainosus    NASH (nonalcoholic steatohepatitis)    Nonobstructive atherosclerosis of coronary artery 2016   Cardiac CTA with mild proximal LAD disease; CAC score 74.   Ocular migraine    Osteoporosis    on Fosamax for 5 years   Other abnormal glucose    Other chronic nonalcoholic liver disease    LFTs normalized with weight loss 2013   Other screening mammogram    Symptomatic menopausal or female climacteric states    Thyroid disease    Tubular adenoma of colon    Urinary tract infection, site not specified    Past Surgical History:  Procedure Laterality Date   abdominal ultrasound  04/12/2004 & 6/06  positive gallstones   ANKLE FRACTURE SURGERY  ~2002   pinning   BTL  30+ years ago   CATARACT EXTRACTION  2013   B eyes   COLONOSCOPY  06/08/06   Polyps, divertics (Dr. Sharlett Iles)   CT abdomen  04/29/04   Positive gallstones   INTRAMEDULLARY (IM) NAIL INTERTROCHANTERIC Left 11/14/2020   Procedure: INTRAMEDULLARY (IM) NAIL INTERTROCHANTRIC;  Surgeon: Earnestine Leys, MD;  Location: [facility] ORS;  Service: Orthopedics;  Laterality: Left;   TONSILLECTOMY AND ADENOIDECTOMY  Age 63   ultrasound pelvis  6/06   Negative    FAMILY  HISTORY Family History  Problem Relation Age of Onset   Depression Mother        Manic depression   Diabetes Mother        Type 2   Breast cancer Mother    Heart disease Mother    Colon polyps Father    Heart attack Father 81   Breast cancer Maternal Aunt    Breast cancer Paternal Aunt    Crohn's disease Brother    Osteoporosis Brother    Cancer Neg Hx    Colon cancer Neg Hx     SOCIAL HISTORY Social History   Tobacco Use   Smoking status: Former    Types: Cigarettes    Quit date: 03/19/2001    Years since quitting: 21.2   Smokeless tobacco: Never   Tobacco comments:    Quit in 2001  Vaping Use   Vaping Use: Never used  Substance Use Topics   Alcohol use: Yes    Comment: 1 glass of wine per month or 1/2 beer   Drug use: No         OPHTHALMIC EXAM:  Base Eye Exam     Visual Acuity (ETDRS)       Right Left   Dist [place] 20/400 20/50 +3         Tonometry (Tonopen, 11:27 AM)       Right Left   Pressure 15 18         Pupils       Pupils APD   Right PERRL None   Left PERRL None         Visual Fields       Left Right   Restrictions Partial inner superior temporal, inferior temporal, superior nasal, inferior nasal deficiencies          Neuro/Psych     Oriented x3: Yes   Mood/Affect: Normal         Dilation     Both eyes: 1.0% Mydriacyl, 2.5% Phenylephrine @ 11:24 AM           Slit Lamp and Fundus Exam     External Exam       Right Left   External Normal Normal         Slit Lamp Exam       Right Left   Lids/Lashes Normal Normal   Conjunctiva/Sclera White and quiet White and quiet   Cornea Clear Clear   Anterior Chamber Deep and quiet Deep and quiet   Iris Round and reactive Round and reactive   Lens Posterior chamber intraocular lens Posterior chamber intraocular lens   Anterior Vitreous Normal Normal         Fundus Exam       Right Left   Posterior Vitreous Posterior vitreous detachment Posterior vitreous  detachment   Disc Normal Normal   C/D Ratio 0.55 0.55   Macula Geographic atrophy  centrally, 6-7 DA size Disciform scar, Geographic atrophy near the FAZ, no exudates, Retinal pigment epithelial mottling, no hemorrhage   Vessels Normal Normal   Periphery Normal Normal            IMAGING AND PROCEDURES  Imaging and Procedures for 06/19/22  OCT, Retina - OU - Both Eyes       Right Eye Quality was good. Scan locations included subfoveal. Central Foveal Thickness: 184. Progression has been stable. Findings include no IRF, no SRF, abnormal foveal contour, retinal drusen , central retinal atrophy, inner retinal atrophy, outer retinal atrophy.   Left Eye Quality was good. Scan locations included subfoveal. Central Foveal Thickness: 290. Progression has been stable. Findings include no SRF, abnormal foveal contour, disciform scar, subretinal scarring, central retinal atrophy.   Notes OD, no active CNVM, no OS with much less subretinal fluid CME as compared to January 2021.  Currently at 81-monthfollow-up left eye and no therapy for 12 months OS        Color Fundus Photography Optos - OU - Both Eyes       Right Eye Progression has been stable. Disc findings include normal observations. Macula : geographic atrophy. Vessels : normal observations. Periphery : normal observations.   Left Eye Progression has been stable. Disc findings include normal observations. Macula : geographic atrophy. Vessels : normal observations. Periphery : normal observations.   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 The results of the AREDS 2 study was discussed with the  patient. A diet rich in dark leafy green vegetables was advised and specific recommendations were made regarding supplements with AREDS 2 formulation . Control of hypertension and serum cholesterol may slow the disease. Smoking cessation is mandatory to slow the disease and diminish the risk of progressing to wet age related macular degeneration. The patient was instructed in the use of an [place] was told to return immediately for any changes in the Grid. Stressed to the patient do not rub eyes  Two forms of dry ARMD exist, one with drusen deposits, yellowish deposits with no obvious atrophy,  ATROPHIC which is the loss of normal blood supply to the choroid (the orange or copper green color layer of the outer retina), which is seen as large areas of atrophy which grow over time to decrease vision.  This atrophic form of dry ARMD is treatable as of Fall 2023.  The treatment consists of injection into the affected eye monthly at first, in attempt to slow the growth of the atrophy. The first approved medication is SYFOVRE (pegcetacoplan).   Continue Syfovre in the near future  Advanced nonexudative age-related macular degeneration of right eye with subfoveal involvement The nature of dry age related macular degeneration was discussed with the patient as well as its possible conversion to wet. The results of the AREDS 2 study was discussed with the patient. A diet rich in dark leafy green vegetables was advised and specific recommendations were made regarding supplements with AREDS 2 formulation . Control of hypertension and serum cholesterol may slow the disease. Smoking cessation is mandatory to slow the disease and diminish the risk of progressing to  wet age related macular degeneration. The patient was instructed in the use of an [place] and was told to return immediately for any changes in the Grid. Stressed to the patient do not rub eyes  Two forms of dry ARMD exist, one with drusen  deposits, yellowish deposits with no obvious atrophy,  ATROPHIC which is the loss of normal blood supply to the choroid (the orange or copper green color layer of the outer retina), which is seen as large areas of atrophy which grow over time to decrease vision.  This atrophic form of dry ARMD is treatable as of Fall 2023.  The treatment consists of injection into the affected eye monthly at first, in attempt to slow the growth of the atrophy. The first approved medication is SYFOVRE (pegcetacoplan).  Subfoveal involvement of dry AMD     ICD-10-CM   1. Exudative age-related macular degeneration of left eye with active choroidal neovascularization (HCC)  H35.3221 OCT, Retina - OU - Both Eyes    Color Fundus Photography Optos - OU - Both Eyes    2. 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 Advanced nonexudative age-related macular degeneration of right eye with subfoveal involvement  H35.3114       1.  OS doing as well as possible with history of scarring, and subfoveal and perifoveal dry atrophic AMD, may be a candidate for Syfovre in the near future  2.  OD large geographic subfoveal atrophy, will observe  3.  No signs of exudative CNVM OS today  Ophthalmic Meds Ordered this visit:  No orders of the defined types were placed in this encounter.      Return in about 4 months (around 10/19/2022) for DILATE OU, COLOR FP, OCT,, possible Syfovre OS.  There are no Patient Instructions on file for this visit.   Explained the diagnoses, plan, and follow up with the patient and they expressed understanding.  Patient expressed understanding of the importance of proper follow up care.   Clent Demark Javed Cotto M.D. Diseases & Surgery of the Retina and Vitreous Retina & Diabetic [place] 06/19/22     Abbreviations: M myopia (nearsighted); A astigmatism; H hyperopia (farsighted); P presbyopia; Mrx spectacle prescription;   CTL contact lenses; OD right eye; OS left eye; OU both eyes  XT exotropia; ET esotropia; PEK punctate epithelial keratitis; PEE punctate epithelial erosions; DES dry eye syndrome; MGD meibomian gland dysfunction; ATs artificial tears; PFAT's preservative free artificial tears; [place] nuclear sclerotic cataract; PSC posterior subcapsular cataract; ERM epi-retinal membrane; PVD posterior vitreous detachment; RD retinal detachment; DM diabetes mellitus; DR diabetic retinopathy; NPDR non-proliferative diabetic retinopathy; PDR proliferative diabetic retinopathy; CSME clinically significant macular edema; DME diabetic macular edema; dbh dot blot hemorrhages; CWS cotton wool spot; POAG primary open angle glaucoma; C/D cup-to-disc ratio; HVF humphrey visual field; GVF goldmann visual field; OCT optical coherence tomography; IOP intraocular pressure; BRVO Branch retinal vein occlusion; CRVO central retinal vein occlusion; CRAO central retinal artery occlusion; BRAO branch retinal artery occlusion; RT retinal tear; SB scleral buckle; PPV pars plana vitrectomy; VH Vitreous hemorrhage; PRP panretinal laser photocoagulation; IVK intravitreal kenalog; VMT vitreomacular traction; MH Macular hole;  NVD neovascularization of the disc; NVE neovascularization elsewhere; AREDS age related eye disease study; ARMD age related macular degeneration; POAG primary open angle glaucoma; EBMD epithelial/anterior basement membrane dystrophy; ACIOL anterior chamber intraocular lens; IOL intraocular lens; PCIOL posterior chamber intraocular lens; Phaco/IOL phacoemulsification with intraocular lens placement; PRK photorefractive  keratectomy; LASIK laser assisted in situ keratomileusis; HTN hypertension; DM diabetes mellitus; COPD chronic obstructive pulmonary disease

## 2022-06-19 NOTE — Assessment & Plan Note (Addendum)
The nature of dry age related macular degeneration was discussed with the patient as well as its possible conversion to wet. The results of the AREDS 2 study was discussed with the patient. A diet rich in dark leafy green vegetables was advised and specific recommendations were made regarding supplements with AREDS 2 formulation . Control of hypertension and serum cholesterol may slow the disease. Smoking cessation is mandatory to slow the disease and diminish the risk of progressing to wet age related macular degeneration. The patient was instructed in the use of an [place] and was told to return immediately for any changes in the Grid. Stressed to the patient do not rub eyes  Two forms of dry ARMD exist, one with drusen deposits, yellowish deposits with no obvious atrophy,  ATROPHIC which is the loss of normal blood supply to the choroid (the orange or copper green color layer of the outer retina), which is seen as large areas of atrophy which grow over time to decrease vision.  This atrophic form of dry ARMD is treatable as of Fall 2023.  The treatment consists of injection into the affected eye monthly at first, in attempt to slow the growth of the atrophy. The first approved medication is SYFOVRE (pegcetacoplan).   Continue Syfovre in the near future

## 2022-06-20 DIAGNOSIS — M41125 Adolescent idiopathic scoliosis, thoracolumbar region: Secondary | ICD-10-CM | POA: Diagnosis not present

## 2022-06-20 DIAGNOSIS — M955 Acquired deformity of pelvis: Secondary | ICD-10-CM | POA: Diagnosis not present

## 2022-06-20 DIAGNOSIS — M9903 Segmental and somatic dysfunction of lumbar region: Secondary | ICD-10-CM | POA: Diagnosis not present

## 2022-06-20 DIAGNOSIS — M9905 Segmental and somatic dysfunction of pelvic region: Secondary | ICD-10-CM | POA: Diagnosis not present

## 2022-07-02 ENCOUNTER — Other Ambulatory Visit: Payer: Self-pay | Admitting: Family Medicine

## 2022-07-07 ENCOUNTER — Telehealth: Payer: Self-pay

## 2022-07-07 NOTE — Progress Notes (Cosign Needed Addendum)
    Chronic Care Management Pharmacy Assistant   Name: Susan [name]  MRN: [id number] DOB: 1946-06-23  Reason for Encounter: CCM (Appointment Reminder)  Medications: Outpatient Encounter Medications as of 07/07/2022  Medication Sig   acetaminophen (TYLENOL) 500 MG tablet Take 750 mg by mouth 2 (two) times daily. Takes QOD (alternates with Advil)   aspirin 81 MG tablet Take 81 mg by mouth daily.   baclofen (LIORESAL) 10 MG tablet TAKE 1 TABLET BY MOUTH TWICE DAILY AS NEEDED FOR MUSCLE SPASM   Cholecalciferol 1000 units tablet Take 2 tablets (2,000 Units total) by mouth daily.   ezetimibe (ZETIA) 10 MG tablet TAKE 1 TABLET BY MOUTH DAILY.   ibuprofen (ADVIL) 200 MG tablet Take 400 mg by mouth 2 (two) times daily. Alternates QOD with acetaminophen   lisinopril-hydrochlorothiazide (ZESTORETIC) 20-25 MG tablet TAKE 1 TABLET EVERY DAY   vitamin B-12 (CYANOCOBALAMIN) 1000 MCG tablet Take 1,000 mcg by mouth daily.   No facility-administered encounter medications on file as of 07/07/2022.   Susan [name] was contacted to remind of upcoming telephone visit with Charlene Brooke on 07/13/2022 at 3:00. Patient was reminded to have any blood glucose and blood pressure readings available for review at appointment.   Message was left reminding patient of appointment.  CCM referral has been placed prior to visit?  No   Recommendations/Changes made from today's visit: -No med changes today -Consider trial of low-dose statin (rosuvastatin 5 mg) - pt to follow up with cardiology after stress test -Consider Prolia once Vitamin D replaced (repeat labwork 02/13/22)   Plan: -[place] will call patient 3 months for general adherence review -Pharmacist follow up televisit scheduled for 6 months  Star Rating Drugs: Medication:   Last Fill: Day Supply Lisinopril-HCTZ 20-25 mg  Verified with [place] [phone]  Charlene Brooke, CPP notified  Marijean Niemann, [place] Assistant [phone]

## 2022-07-11 DIAGNOSIS — M955 Acquired deformity of pelvis: Secondary | ICD-10-CM | POA: Diagnosis not present

## 2022-07-11 DIAGNOSIS — M9905 Segmental and somatic dysfunction of pelvic region: Secondary | ICD-10-CM | POA: Diagnosis not present

## 2022-07-11 DIAGNOSIS — M41125 Adolescent idiopathic scoliosis, thoracolumbar region: Secondary | ICD-10-CM | POA: Diagnosis not present

## 2022-07-11 DIAGNOSIS — M9903 Segmental and somatic dysfunction of lumbar region: Secondary | ICD-10-CM | POA: Diagnosis not present

## 2022-07-13 ENCOUNTER — Ambulatory Visit: Payer: Medicare HMO | Admitting: Pharmacist

## 2022-07-13 DIAGNOSIS — I10 Essential (primary) hypertension: Secondary | ICD-10-CM

## 2022-07-13 DIAGNOSIS — E785 Hyperlipidemia, unspecified: Secondary | ICD-10-CM

## 2022-07-13 DIAGNOSIS — M810 Age-related osteoporosis without current pathological fracture: Secondary | ICD-10-CM

## 2022-07-13 DIAGNOSIS — I25119 Atherosclerotic heart disease of native coronary artery with unspecified angina pectoris: Secondary | ICD-10-CM

## 2022-07-13 NOTE — Progress Notes (Signed)
Chronic Care Management Pharmacy Note  07/13/2022 Name:  Susan [name] MRN:  [id number] DOB:  Apr 06, 1946  Summary: CCM F/U visit -Reviewed medications; pt affirms compliance -Pt reports some hip pain after Prolia injection, overall it is tolerable right now  Recommendations/Changes made from today's visit: -No med changes today; continue to monitor hip pain with Prolia  Plan: -Transition CCM to Self Care: Patient achieved CCM goals and no longer needs to be contacted as frequently. The patient has been provided with contact information for the care management team and has been advised to call with any health related questions or concerns.       Subjective: Susan [name] is an 76 y.o. year old female who is a primary patient of Damita Dunnings, Elveria Rising, MD.  The CCM team was consulted for assistance with disease management and care coordination needs.    Engaged with patient by telephone for follow up visit in response to provider referral for pharmacy case management and/or care coordination services.   Consent to Services:  The patient was given information about Chronic Care Management services, agreed to services, and gave verbal consent prior to initiation of services.  Please see initial visit note for detailed documentation.   Patient Care Team: Tonia Ghent, MD as PCP - General End, Harrell Gave, MD as PCP - Cardiology (Cardiology) Celesta Gentile, MD as Consulting Physician (Cardiology) Lorelee Cover., MD as Referring Physician (Ophthalmology) Zadie Rhine Clent Demark, MD as Consulting Physician (Ophthalmology) Charlton Haws, [facility] as Pharmacist (Pharmacist)  Recent office visits: 05/12/22 Dr Damita Dunnings OV: f/u osteoporosis. Labs stable. Set up for Prolia. First inject 05/25/22.  11/14/2021 - Elsie Stain, MD - Patient presented for Vit D deficiency. Immunizations: [employer]. Change: Increase Vit D to 2000 units daily.  11/11/2021 Orrin Brigham, LPN - Patient presented for  Annual Wellness Visit.  11/03/2021 - Elsie Stain, MD - Lab ordered: Abnormal Results: No provider notes.  07/25/2021 - Elsie Stain, MD - Patient presented for Osteoporosis. No medication changes.   Recent consult visits: 04/05/22 Dr End (Cardiology): f/u CAD - no changes. Stress test from March low risk. D/c furosemide (pt not taking).  12/29/2021 - Nelva Bush, MD - Cardiology - Patient presented for valvular heart disease. Ordered: Cardiac stress test and EKG. Start: furosemide (LASIX) 20 MG tablet due to edema/weight gain. Consider alternate statin depending on stress test results.  12/19/2021 - Philemon Kingdom, MD - Patient presented for multiple thyroid nodules. No medication changes. Return PRN. 10/17/2021 - Deloria Lair, MD - Ophthalmology - Patient presented for exudative age-related macular degeneration of left eye with active choroidal neovascularization. Ordered: Color Fundus Photography and OCT Retina OU.  07/28/2021 - Earnestine Leys - Orthopedic Surgery - Patient presented for collapsed vertebra. Procedure: CHG X-ray lumbar spine. Diagnoses: Compression fracture of lumbar spine.   Hospital visits: None in previous 6 months   Objective:  Lab Results  Component Value Date   CREATININE 0.72 05/12/2022   BUN 29 (H) 05/12/2022   GFR 80.96 05/12/2022   GFRNONAA >60 01/01/2021   GFRAA 92 06/09/2020   NA 141 05/12/2022   K 4.3 05/12/2022   CALCIUM 9.9 05/12/2022   CO2 31 05/12/2022   GLUCOSE 96 05/12/2022    Lab Results  Component Value Date/Time   HGBA1C 5.5 11/14/2020 04:42 AM   HGBA1C 6.0 02/26/2017 10:19 AM   GFR 80.96 05/12/2022 12:02 PM   GFR 85.87 11/10/2021 02:47 PM    Last diabetic Eye exam:  Lab Results  Component Value Date/Time   HMDIABEYEEXA Retinopathy (A) 05/28/2018 12:00 AM    Last diabetic Foot exam: No results found for: "HMDIABFOOTEX"   Lab Results  Component Value Date   CHOL 160 11/10/2021   HDL 51.80 11/10/2021   LDLCALC 86 11/10/2021    TRIG 112.0 11/10/2021   CHOLHDL 3 11/10/2021       Latest Ref Rng & Units 11/10/2021    2:47 PM 05/30/2021   12:17 PM 01/01/2021    2:24 PM  Hepatic Function  Total Protein 6.0 - 8.3 g/dL 7.2  7.3  7.5   Albumin 3.5 - 5.2 g/dL 4.4  4.6  4.1   AST 0 - 37 U/L 22  33  34   ALT 0 - 35 U/L _0 Alk Phosphatase 39 - 117 U/L 53  49  69   Total Bilirubin 0.2 - 1.2 mg/dL 0.5  0.7  0.6     Lab Results  Component Value Date/Time   TSH 2.16 11/10/2021 02:47 PM   TSH 3.51 05/30/2021 12:17 PM   FREET4 1.11 10/17/2018 12:00 AM       Latest Ref Rng & Units 11/10/2021    2:47 PM 05/30/2021   12:17 PM 01/01/2021    2:24 PM  CBC  WBC 4.0 - 10.5 K/uL 6.0  5.4  7.3   Hemoglobin 12.0 - 15.0 g/dL 12.8  13.3  12.9   Hematocrit 36.0 - 46.0 % 38.4  39.7  39.6   Platelets 150.0 - 400.0 K/uL 198.0  190.0  200     Lab Results  Component Value Date/Time   VD25OH 35.54 05/12/2022 12:02 PM   VD25OH 40.35 02/13/2022 10:19 AM    Clinical ASCVD: Yes  The 10-year ASCVD risk score (Arnett DK, et al., 2019) is: 20.1%   Values used to calculate the score:     Age: 76 years     Sex: Female     Is Non-Hispanic African American: No     Diabetic: No     Tobacco smoker: No     Systolic Blood Pressure: 032 mmHg     Is BP treated: Yes     HDL Cholesterol: 51.8 mg/dL     Total Cholesterol: 160 mg/dL       11/11/2021    1:25 PM 05/30/2021   11:31 AM 04/23/2019   11:45 AM  Depression screen PHQ 2/9  Decreased Interest 0 0 0  Down, Depressed, Hopeless 1 0 0  PHQ - 2 Score 1 0 0  Altered sleeping  0 0  Tired, decreased energy  0 0  Change in appetite  0 0  Feeling bad or failure about yourself   0 0  Trouble concentrating  0 0  Moving slowly or fidgety/restless  0 0  Suicidal thoughts  0 0  PHQ-9 Score  0 0  Difficult doing work/chores   Not difficult at all     Social History   Tobacco Use  Smoking Status Former   Types: Cigarettes   Quit date: 03/19/2001   Years since quitting: 21.3   Smokeless Tobacco Never  Tobacco Comments   Quit in 2001   BP Readings from Last 3 Encounters:  05/12/22 112/70  04/05/22 140/76  12/29/21 140/76   Pulse Readings from Last 3 Encounters:  05/12/22 92  04/05/22 68  12/29/21 82   Wt Readings from Last 3 Encounters:  05/12/22 135 lb (61.2 kg)  04/05/22 136 lb (61.7 kg)  12/29/21 135 lb (61.2 kg)   BMI Readings from Last 3 Encounters:  05/12/22 26.37 kg/m  04/05/22 26.56 kg/m  12/29/21 26.37 kg/m    Assessment/Interventions: Review of patient past medical history, allergies, medications, health status, including review of consultants reports, laboratory and other test data, was performed as part of comprehensive evaluation and provision of chronic care management services.   SDOH:  (Social Determinants of Health) assessments and interventions performed: No - last assessed 11/2021 AWV SDOH Interventions    Flowsheet Row Clinical Support from 04/23/2019 in [place] at [place] from 01/25/2018 in [place] at [place]  SDOH Interventions    Depression Interventions/Treatment  HGD9-2 Score <4 Follow-up Not Indicated --  [referral to PCP]      SDOH Screenings   Food Insecurity: No Food Insecurity (11/11/2021)  Housing: Low Risk  (11/11/2021)  Transportation Needs: No Transportation Needs (11/11/2021)  Alcohol Screen: Low Risk  (11/11/2021)  Depression (PHQ2-9): Low Risk  (11/11/2021)  Financial Resource Strain: Low Risk  (11/11/2021)  Physical Activity: Insufficiently Active (11/11/2021)  Social Connections: Moderately Isolated (11/11/2021)  Stress: No Stress Concern Present (11/11/2021)  Tobacco Use: Medium Risk (06/19/2022)    CCM Care Plan  Allergies  Allergen Reactions   Lipitor [Atorvastatin Calcium] Other (See Comments)    Aches on med at 17m a day.    Penicillins Swelling and Rash    Rash and "swelling" in high school. Tolerated cefazolin 11/14/2020    Sulfonamide Derivatives Rash     Medications Reviewed Today     Reviewed by RHurman Horn MD (Physician) on 06/19/22 at 1159  Med List Status: <None>   Medication Order Taking? Sig Documenting Provider Last Dose Status Informant  acetaminophen (TYLENOL) 500 MG tablet [id number] Take 750 mg by mouth 2 (two) times daily. Takes QOD (alternates with Advil) [provider] Taking Active   aspirin 81 MG tablet [id number] Take 81 mg by mouth daily. [provider] Taking Active Self  baclofen (LIORESAL) 10 MG tablet [id number] TAKE 1 TABLET BY MOUTH TWICE DAILY AS NEEDED FOR MUSCLE SPASM DTonia Ghent MD  Active   Cholecalciferol 1000 units tablet [id number] Take 2 tablets (2,000 Units total) by mouth daily. DTonia Ghent MD Taking Active   ezetimibe (ZETIA) 10 MG tablet [id number] TAKE 1 TABLET BY MOUTH DAILY. End, CHarrell Gave MD Taking Active   ibuprofen (ADVIL) 200 MG tablet [id number] Take 400 mg by mouth 2 (two) times daily. Alternates QOD with acetaminophen [provider] Taking Active   lisinopril-hydrochlorothiazide (ZESTORETIC) 20-25 MG tablet [id number] TAKE 1 TABLET EVERY DAY End, Christopher, MD Taking Active   vitamin B-12 (CYANOCOBALAMIN) 1000 MCG tablet [id number] Take 1,000 mcg by mouth daily. [provider] Taking Active Self            Patient Active Problem List   Diagnosis Date Noted   SOB (shortness of breath) 12/30/2021   Back pain 11/16/2021   Advanced nonexudative age-related macular degeneration of left eye without subfoveal involvement 03/10/2021   Tricuspid valve insufficiency    NASH (nonalcoholic steatohepatitis)    Chronic Grade I diastolic CHF on echocardiogram 06/08/20 ([place] 11/13/2020   Valvular heart disease 06/09/2020   Exudative age-related macular degeneration of left eye with active choroidal neovascularization ([place] 02/10/2020   Exudative age-related macular degeneration of right eye with inactive choroidal  neovascularization ([place] 02/10/2020   Posterior vitreous detachment of right eye 02/10/2020   Posterior vitreous  detachment of left eye 02/10/2020   Advanced nonexudative age-related macular degeneration of right eye with subfoveal involvement 02/10/2020   Mitral valve insufficiency 12/12/2019   Aortic valve regurgitation 12/12/2019   Coronary artery disease involving native coronary artery of native heart with angina pectoris ([place]) 09/05/2019   Health care maintenance 02/03/2018   Carpal tunnel syndrome of right wrist 04/10/2017   Hyperlipidemia LDL goal <70 07/23/2016   Dupuytren contracture 03/24/2015   Advance care planning 03/24/2015   Benign paroxysmal positional vertigo 11/09/2014   Anxiety state 11/09/2014   Medicare annual wellness visit, initial 08/25/2013   Multiple thyroid nodules 08/06/2013   Bilateral carotid artery stenosis 05/22/2013   Memory changes 08/27/2012   B12 deficiency 11/18/2010   Irritable bowel syndrome 11/11/2010   DIVERTICULOSIS, COLON 11/04/2010   Hyperglycemia 08/12/2010   MIGRAINE HEADACHE 12/27/2007   Essential hypertension 12/27/2007   MENOPAUSAL SYNDROME 12/27/2007   Osteoporosis 11/05/2007    Immunization History  Administered Date(s) Administered   Fluad Quad(high Dose 65+) 11/14/2021   Influenza Whole 08/12/2010   Influenza, Seasonal, Injecte, Preservative Fre 12/13/2012   Influenza,inj,Quad PF,6+ Mos 08/25/2013, 09/03/2014, 07/13/2016, 12/13/2017   PFIZER(Purple Top)SARS-COV-2 Vaccination 12/29/2019, 01/19/2020, 09/20/2020   Pneumococcal Conjugate-13 03/22/2015   Pneumococcal Polysaccharide-23 08/12/2010   Td 11/06/1998, 08/12/2010    Conditions to be addressed/monitored:  Hypertension, Hyperlipidemia, Heart Failure, Coronary Artery Disease, and Osteoporosis  Care Plan : [place]  Updates made by Charlton Haws, [place] since 07/24/2022 12:00 AM     Problem: Hypertension, Hyperlipidemia, Heart Failure, Coronary  Artery Disease, and Osteoporosis   Priority: High     Long-Range Goal: Disease mgmt   Start Date: 01/11/2022  Expected End Date: 07/24/2022  This Visit's Progress: On track  Recent Progress: On track  Priority: High  Note:   Current Barriers:  None identified  Pharmacist Clinical Goal(s):  Patient will achieve adherence to monitoring guidelines and medication adherence to achieve therapeutic efficacy through collaboration with PharmD and provider.   Interventions: 1:1 collaboration with Tonia Ghent, MD regarding development and update of comprehensive plan of care as evidenced by provider attestation and co-signature Inter-disciplinary care team collaboration (see longitudinal plan of care) Comprehensive medication review performed; medication list updated in electronic medical record  Hypertension / Heart Failure (BP goal <130/80) -Controlled - pt reports some dizziness today, she does not typically feel dizzy and reports drinking less water than usual the past few days -Last ejection fraction: 60-65% (Date: 12/2021) -HF type: Diastolic; NYHA Class: II (slight limitation of activity) -Current home BP readings: n/a -Current treatment: Lisinopril-HCTZ 20-25 mg daily - Appropriate, Effective, Safe, Accessible -Medications previously tried: n/a  -Current exercise habits: walks up and down driveway, around house -Educated on BP goals and benefits of medications for prevention of heart attack, stroke and kidney damage; Daily salt intake goal < 2300 mg; -Educated on Symptoms of hypotension and importance of maintaining adequate hydration; -Counseled to monitor BP at home 2-3x weekly  -Recommended to continue current medication  Hyperlipidemia: (LDL goal < 70) -Not ideally controlled - LDL 86 (11/2021) above goal; pt has not tolerated previous statins -Hx CAD; 01/2022 stress test with no ischemia, mild aortic atherosclerosis -Current treatment: Ezetimibe 10 mg daily - Appropriate,  Query Effective Aspirin 81 mg daily - Appropriate, Effective, Safe, Accessible -Medications previously tried: atorvastatin 20 mg (muscle aches), rosuvastatin 10 mg -Educated on Cholesterol goals; Benefits of statin for ASCVD risk reduction; -Recommended to continue current medication  Osteoporosis (Goal prevent fractures) -Controlled-  pt has started Prolia injections and reports some mild hip pain following injection but tolerable -hx Hip fracture Jan 2022, lumbar compression fracture 07/2021 -Last DEXA Scan: 07/08/21 (worse than previous); she has discussed Prolia with PCP 11/2021, decision to first correct Vitamin D  T-Score femoral neck: -3.4  T-Score total hip: -2.4  T-Score lumbar spine: -3.0  -Patient is a candidate for pharmacologic treatment due to T-Score < -2.5 in femoral neck and T-Score < -2.5 in lumbar spine -Current treatment  Vitamin D 1000 - 2 cap daily - Appropriate, Effective, Safe, Accessible Prolia 60 mg q6 months (1st inj 05/25/22) Appropriate, Query Effective -Medications previously tried: alendronate x 5 years  -Recommend weight-bearing and muscle strengthening exercises for building and maintaining bone density. -Recommend repeat DEXA 1 year  Back pain (Goal: manage symptoms) -Controlled - pt reports overall improvement in back pain lately; she is taking 1 baclofen per day -seeing chiropractor regularly -Current treatment  Baclofen 10 mg BID prn - Appropriate, Effective, Safe, Accessible Tylenol 500 mg PRN - Appropriate, Effective, Safe, Accessible -Medications previously tried: n/a  -Recommended to continue current medication  Health Maintenance -Vaccine gaps: Shingrix, TDAP, Flu -Pt plans to get all 3 vaccines at local pharmacy  Patient Goals/Self-Care Activities Patient will:  - take medications as prescribed as evidenced by patient report and record review focus on medication adherence by routine check blood pressure 2-3x weekly, document, and provide at  future appointments       Medication Assistance: None required.  Patient affirms current coverage meets needs.  Compliance/Adherence/Medication fill history: Care Gaps: Colonoscopy (due 03/22/21)  Star-Rating Drugs: Lisinopril-HCTZ - PDC 100%  Medication Access: Within the past 30 days, how often has patient missed a dose of medication? 0 Is a pillbox or other method used to improve adherence? Yes - daughter fills pill box for her Factors that may affect medication adherence? no barriers identified Are meds synced by current pharmacy? No  Are meds delivered by current pharmacy? Yes  Does patient experience delays in picking up medications due to transportation concerns? No   [facility] Services Reviewed: Is patient disadvantaged to use [facility]?: Yes  Current Rx insurance plan: [employer] Name and location of Current pharmacy:  [facility] - [place], [place] [address] Phone: [phone] Fax: [phone]  [place] [address] [place] - [place]) [place] [address] Phone: [phone] Fax: [phone]  [facility] services reviewed with patient today?: No  Patient requests to transfer care to [facility] Pharmacy?: No  Reason patient declined to change pharmacies: Disadvantaged due to insurance/mail order   Care Plan and Follow Up Patient Decision:  Patient agrees to Care Plan and Follow-up.  Plan: The patient has been provided with contact information for the care management team and has been advised to call with any health related questions or concerns.   Charlene Brooke, PharmD, BCACP Clinical Pharmacist [facility] at [facility] [phone]

## 2022-07-24 NOTE — Patient Instructions (Addendum)
Visit Information  Phone number for Pharmacist: [phone]   Goals Addressed   None     Care Plan : [place]  Updates made by Charlton Haws, RPH since 07/24/2022 12:00 AM     Problem: Hypertension, Hyperlipidemia, Heart Failure, Coronary Artery Disease, and Osteoporosis   Priority: High     Long-Range Goal: Disease mgmt   Start Date: 01/11/2022  Expected End Date: 07/24/2022  This Visit's Progress: On track  Recent Progress: On track  Priority: High  Note:   Current Barriers:  None identified  Pharmacist Clinical Goal(s):  Patient will achieve adherence to monitoring guidelines and medication adherence to achieve therapeutic efficacy through collaboration with PharmD and provider.   Interventions: 1:1 collaboration with Tonia Ghent, MD regarding development and update of comprehensive plan of care as evidenced by provider attestation and co-signature Inter-disciplinary care team collaboration (see longitudinal plan of care) Comprehensive medication review performed; medication list updated in electronic medical record  Hypertension / Heart Failure (BP goal <130/80) -Controlled - pt reports some dizziness today, she does not typically feel dizzy and reports drinking less water than usual the past few days -Last ejection fraction: 60-65% (Date: 12/2021) -HF type: Diastolic; NYHA Class: II (slight limitation of activity) -Current home BP readings: n/a -Current treatment: Lisinopril-HCTZ 20-25 mg daily - Appropriate, Effective, Safe, Accessible -Medications previously tried: n/a  -Current exercise habits: walks up and down driveway, around house -Educated on BP goals and benefits of medications for prevention of heart attack, stroke and kidney damage; Daily salt intake goal < 2300 mg; -Educated on Symptoms of hypotension and importance of maintaining adequate hydration; -Counseled to monitor BP at home 2-3x weekly  -Recommended to continue current  medication  Hyperlipidemia: (LDL goal < 70) -Not ideally controlled - LDL 86 (11/2021) above goal; pt has not tolerated previous statins -Hx CAD; 01/2022 stress test with no ischemia, mild aortic atherosclerosis -Current treatment: Ezetimibe 10 mg daily - Appropriate, Query Effective Aspirin 81 mg daily - Appropriate, Effective, Safe, Accessible -Medications previously tried: atorvastatin 20 mg (muscle aches), rosuvastatin 10 mg -Educated on Cholesterol goals; Benefits of statin for ASCVD risk reduction; -Recommended to continue current medication  Osteoporosis (Goal prevent fractures) -Controlled- pt has started Prolia injections and reports some mild hip pain following injection but tolerable -hx Hip fracture Jan 2022, lumbar compression fracture 07/2021 -Last DEXA Scan: 07/08/21 (worse than previous); she has discussed Prolia with PCP 11/2021, decision to first correct Vitamin D  T-Score femoral neck: -3.4  T-Score total hip: -2.4  T-Score lumbar spine: -3.0  -Patient is a candidate for pharmacologic treatment due to T-Score < -2.5 in femoral neck and T-Score < -2.5 in lumbar spine -Current treatment  Vitamin D 1000 - 2 cap daily - Appropriate, Effective, Safe, Accessible Prolia 60 mg q6 months (1st inj 05/25/22) Appropriate, Query Effective -Medications previously tried: alendronate x 5 years  -Recommend weight-bearing and muscle strengthening exercises for building and maintaining bone density. -Recommend repeat DEXA 1 year  Back pain (Goal: manage symptoms) -Controlled - pt reports overall improvement in back pain lately; she is taking 1 baclofen per day -seeing chiropractor regularly -Current treatment  Baclofen 10 mg BID prn - Appropriate, Effective, Safe, Accessible Tylenol 500 mg PRN - Appropriate, Effective, Safe, Accessible -Medications previously tried: n/a  -Recommended to continue current medication  Health Maintenance -Vaccine gaps: Shingrix, TDAP, Flu -Pt plans to get  all 3 vaccines at local pharmacy  Patient Goals/Self-Care Activities Patient will:  - take  medications as prescribed as evidenced by patient report and record review focus on medication adherence by routine check blood pressure 2-3x weekly, document, and provide at future appointments       Patient verbalizes understanding of instructions and care plan provided today and agrees to view in [place]. Active MyChart status and patient understanding of how to access instructions and care plan via MyChart confirmed with patient.    The patient has been provided with contact information for the care management team and has been advised to call with any health related questions or concerns.   Charlene Brooke, PharmD, BCACP Clinical Pharmacist [facility] at [facility] [phone]

## 2022-08-06 ENCOUNTER — Other Ambulatory Visit: Payer: Self-pay | Admitting: Family Medicine

## 2022-08-08 DIAGNOSIS — M41125 Adolescent idiopathic scoliosis, thoracolumbar region: Secondary | ICD-10-CM | POA: Diagnosis not present

## 2022-08-08 DIAGNOSIS — M955 Acquired deformity of pelvis: Secondary | ICD-10-CM | POA: Diagnosis not present

## 2022-08-08 DIAGNOSIS — M9903 Segmental and somatic dysfunction of lumbar region: Secondary | ICD-10-CM | POA: Diagnosis not present

## 2022-08-08 DIAGNOSIS — M9905 Segmental and somatic dysfunction of pelvic region: Secondary | ICD-10-CM | POA: Diagnosis not present

## 2022-09-05 DIAGNOSIS — M955 Acquired deformity of pelvis: Secondary | ICD-10-CM | POA: Diagnosis not present

## 2022-09-05 DIAGNOSIS — M41125 Adolescent idiopathic scoliosis, thoracolumbar region: Secondary | ICD-10-CM | POA: Diagnosis not present

## 2022-09-05 DIAGNOSIS — M9905 Segmental and somatic dysfunction of pelvic region: Secondary | ICD-10-CM | POA: Diagnosis not present

## 2022-09-05 DIAGNOSIS — M9903 Segmental and somatic dysfunction of lumbar region: Secondary | ICD-10-CM | POA: Diagnosis not present

## 2022-10-03 DIAGNOSIS — M9905 Segmental and somatic dysfunction of pelvic region: Secondary | ICD-10-CM | POA: Diagnosis not present

## 2022-10-03 DIAGNOSIS — M955 Acquired deformity of pelvis: Secondary | ICD-10-CM | POA: Diagnosis not present

## 2022-10-03 DIAGNOSIS — M9903 Segmental and somatic dysfunction of lumbar region: Secondary | ICD-10-CM | POA: Diagnosis not present

## 2022-10-03 DIAGNOSIS — M41125 Adolescent idiopathic scoliosis, thoracolumbar region: Secondary | ICD-10-CM | POA: Diagnosis not present

## 2022-10-06 ENCOUNTER — Encounter: Payer: Self-pay | Admitting: Internal Medicine

## 2022-10-06 ENCOUNTER — Ambulatory Visit: Payer: Medicare HMO | Attending: Internal Medicine | Admitting: Internal Medicine

## 2022-10-06 ENCOUNTER — Other Ambulatory Visit
Admission: RE | Admit: 2022-10-06 | Discharge: 2022-10-06 | Disposition: A | Discharge status: Home / Self Care | Payer: Medicare HMO | Source: Ambulatory Visit | Attending: Internal Medicine | Admitting: Internal Medicine

## 2022-10-06 VITALS — BP 142/80 | HR 69 | Ht 59.0 in | Wt 134.0 lb

## 2022-10-06 DIAGNOSIS — E785 Hyperlipidemia, unspecified: Secondary | ICD-10-CM

## 2022-10-06 DIAGNOSIS — I10 Essential (primary) hypertension: Secondary | ICD-10-CM | POA: Diagnosis not present

## 2022-10-06 DIAGNOSIS — I38 Endocarditis, valve unspecified: Secondary | ICD-10-CM

## 2022-10-06 DIAGNOSIS — I2510 Atherosclerotic heart disease of native coronary artery without angina pectoris: Secondary | ICD-10-CM

## 2022-10-06 LAB — LIPID PANEL
Cholesterol: 173 mg/dL (ref 0–200)
HDL: 47 mg/dL (ref >40)
LDL Cholesterol: 101 mg/dL — ABNORMAL HIGH (ref 0–99)
Total CHOL/HDL Ratio: 3.7 RATIO
Triglycerides: 125 mg/dL (ref <150)
VLDL: 25 mg/dL (ref 0–40)

## 2022-10-06 LAB — COMPREHENSIVE METABOLIC PANEL
ALT: 15 U/L (ref 0–44)
AST: 22 U/L (ref 15–41)
Albumin: 4.1 g/dL (ref 3.5–5.0)
Alkaline Phosphatase: 37 U/L — ABNORMAL LOW (ref 38–126)
Anion gap: 9 (ref 5–15)
BUN: 26 mg/dL — ABNORMAL HIGH (ref 8–23)
CO2: 25 mmol/L (ref 22–32)
Calcium: 9.5 mg/dL (ref 8.9–10.3)
Chloride: 107 mmol/L (ref 98–111)
Creatinine, Ser: 0.67 mg/dL (ref 0.44–1.00)
GFR, Estimated: >60 mL/min (ref >60)
Glucose, Bld: 112 mg/dL — ABNORMAL HIGH (ref 70–99)
Potassium: 3.7 mmol/L (ref 3.5–5.1)
Sodium: 141 mmol/L (ref 135–145)
Total Bilirubin: 0.9 mg/dL (ref 0.3–1.2)
Total Protein: 7.6 g/dL (ref 6.5–8.1)

## 2022-10-06 MED ORDER — EZETIMIBE 10 MG PO TABS: 10.0000 mg | ORAL_TABLET | Freq: Every day | ORAL | 3 refills | Status: DC

## 2022-10-06 MED ORDER — LISINOPRIL-HYDROCHLOROTHIAZIDE 20-25 MG PO TABS: 1.0000 | ORAL_TABLET | Freq: Every day | ORAL | 3 refills | Status: DC

## 2022-10-06 NOTE — Patient Instructions (Signed)
Medication Instructions:  Your Physician recommend you continue on your current medication as directed.    *If you need a refill on your cardiac medications before your next appointment, please call your pharmacy*   Lab Work: Your provider would like for you to have following labs drawn: (CMP, Lipid).   Please go to the [facility] entrance and check in at the front desk.  You do not need an appointment.  They are open from 7am-6 pm.   If you have labs (blood work) drawn today and your tests are completely normal, you will receive your results only by: [place] (if you have MyChart) OR A paper copy in the mail If you have any lab test that is abnormal or we need to change your treatment, we will call you to review the results.   Testing/Procedures: None ordered today   Follow-Up: At [facility], you and your health needs are our priority.  As part of our continuing mission to provide you with exceptional heart care, we have created designated Provider Care Teams.  These Care Teams include your primary Cardiologist (physician) and Advanced Practice Providers (APPs -  Physician Assistants and Nurse Practitioners) who all work together to provide you with the care you need, when you need it.  We recommend signing up for the patient portal called "MyChart".  Sign up information is provided on this After Visit Summary.  MyChart is used to connect with patients for Virtual Visits (Telemedicine).  Patients are able to view lab/test results, encounter notes, upcoming appointments, etc.  Non-urgent messages can be sent to your provider as well.   To learn more about what you can do with MyChart, go to [email or web address].    Your next appointment:   1 year(s)  The format for your next appointment:   In Person  Provider:   You may see Nelva Bush, MD or one of the following Advanced Practice Providers on your designated Care Team:   Murray Hodgkins,  NP Christell Faith, PA-C Cadence Kathlen Mody, PA-C Gerrie Nordmann, NP

## 2022-10-06 NOTE — Progress Notes (Unsigned)
Follow-up Outpatient Visit Date: 10/06/2022  Primary Care Provider: Tonia Ghent, MD Susan [name] [id number]  Chief Complaint: Follow-up valvular heart disease and coronary artery disease  HPI:  Susan [name] is a 76 y.o. female with history of aortic, mitral, and tricuspid regurgitation, nonobstructive coronary artery disease, hypertension, hyperlipidemia, thyroid disease, NASH, and IBS, who presents for follow-up of other heart disease.  I last saw her in May, at which time she was feeling a little bit better than at prior visits though she still noted some fatigue.  She continues to be bothered by back pain after her fall about 2 years ago.  We did not make any medication changes or pursue additional testing.  Today, Susan [name] reports that she has been feeling fairly well.  Her only complaint is of numbness in her right arm.  She attributes this to a combination of carpal tunnel syndrome as well as lying on the right arm at night.  It seems to get better when she moves her arm.  She has not had any other neurologic deficits.  She denies chest pain, palpitations, lightheadedness, and edema.  She has stable exertional dyspnea when walking quickly but overall feels like her stamina is getting better.  She notes 1 episode of dizziness that occurred since our last visit that was accompanied by dyspepsia and nausea.  She rested a few hours and felt fine thereafter.  She has not had any similar episodes since then.  Home blood pressures are typically lower and well within the normal range.  Her personal blood pressure cuff correlates well with office readings today.  --------------------------------------------------------------------------------------------------  Cardiovascular History & Procedures: Cardiovascular Problems: Nonobstructive coronary artery disease Mitral and tricuspid regurgitation   Risk Factors: CAD, hypertension, hyperlipidemia, and age > 26    Cath/PCI: None   CV Surgery: None   EP Procedures and Devices: None   Non-Invasive Evaluation(s): Pharmacologic MPI (02/02/2022): Low risk study without evidence of ischemia or scar.  LVEF greater than 65%.  Coronary artery calcification and aortic atherosclerosis noted. TTE (12/23/2021): Normal LV size and wall thickness.  LVEF 60-65% with grade 1 diastolic dysfunction.  Normal RV size and function.  Mild pulmonary hypertension (RVSP 38 mmHg).  Normal biatrial size.  Mild-moderate aortic regurgitation without stenosis.  Normal CVP. TTE (06/08/2020): Normal LV size and function (EF 60-65%) with grade 1 diastolic dysfunction.  Mild RVH with normal size and contraction.  Mild pulmonary hypertension.  Trivial mitral regurgitation.  Moderate to severe tricuspid regurgitation.  Mild to moderate aortic regurgitation.  Normal CVP. TTE (10/16/2018, Dr. Humphrey Rolls): Normal LV size with mild LVH.  LVEF 70-75%.  Mild to moderate mitral and moderate to severe tricuspid regurgitation.  Moderate aortic regurgitation. Mild to moderate pulmonary hypertension.  RV size and function.    Trivial pericardial effusion. Carotid Doppler (10/16/2018, Dr. Humphrey Rolls): Protruding plaque in both carotid bulbs without obstruction.  Increased in right mid ICA felt to be due to tortuosity. Cardiac CTA (09/14/15, Dr. Humphrey Rolls): CAC score 74.  Mild proximal LAD disease with haziness.  LCx and RCA are normal  Recent CV Pertinent Labs: Lab Results  Component Value Date   CHOL 160 11/10/2021   CHOL 169 10/17/2018   HDL 51.80 11/10/2021   LDLCALC 86 11/10/2021   LDLCALC 102 10/17/2018   TRIG 112.0 11/10/2021   TRIG 112 10/17/2018   CHOLHDL 3 11/10/2021   INR 1.1 11/13/2020   K 4.3 05/12/2022   MG 2.0 11/17/2020  BUN 29 (H) 05/12/2022   BUN 17 06/09/2020   CREATININE 0.72 05/12/2022   CREATININE 0.83 10/17/2018   CREATININE 0.77 01/12/2012    Past medical and surgical history were reviewed and updated in EPIC.  Current Meds   Medication Sig   acetaminophen (TYLENOL) 500 MG tablet Take 750 mg by mouth 2 (two) times daily.   aspirin 81 MG tablet Take 81 mg by mouth daily.   baclofen (LIORESAL) 10 MG tablet TAKE 1 TABLET BY MOUTH TWICE DAILY AS NEEDED FOR MUSCLE SPASM   Cholecalciferol 1000 units tablet Take 2 tablets (2,000 Units total) by mouth daily.   ezetimibe (ZETIA) 10 MG tablet TAKE 1 TABLET BY MOUTH DAILY.   lisinopril-hydrochlorothiazide (ZESTORETIC) 20-25 MG tablet TAKE 1 TABLET EVERY DAY   vitamin B-12 (CYANOCOBALAMIN) 1000 MCG tablet Take 1,000 mcg by mouth daily.    Allergies: Lipitor [atorvastatin calcium], Penicillins, and Sulfonamide derivatives  Social History   Tobacco Use   Smoking status: Former    Types: Cigarettes    Quit date: 03/19/2001    Years since quitting: 21.5   Smokeless tobacco: Never   Tobacco comments:    Quit in 2001  Vaping Use   Vaping Use: Never used  Substance Use Topics   Alcohol use: Yes    Comment: 1 glass of wine or 1/2 beer on occasion   Drug use: No    Family History  Problem Relation Age of Onset   Depression Mother        Manic depression   Diabetes Mother        Type 2   Breast cancer Mother    Heart disease Mother    Colon polyps Father    Heart attack Father 69   Breast cancer Maternal Aunt    Breast cancer Paternal Aunt    Crohn's disease Brother    Osteoporosis Brother    Cancer Neg Hx    Colon cancer Neg Hx     Review of Systems: A 12-system review of systems was performed and was negative except as noted in the HPI.  --------------------------------------------------------------------------------------------------  Physical Exam: BP (!) 142/80 (BP Location: Left Arm)   Pulse 69   Ht 4' 11"  (1.499 m)   Wt 134 lb (60.8 kg)   SpO2 98%   BMI 27.06 kg/m  Repeat BP: 142/80  General:  NAD. Neck: No JVD or HJR. Lungs: Clear to auscultation bilaterally without wheezes or crackles. Heart: Regular rate and rhythm with 1/6 systolic  and early diastolic murmurs.  No rubs or gallops. Abdomen: Soft, nontender, nondistended. Extremities: Trace pretibial edema bilaterally.  EKG:  Normal sinus rhythm with incomplete RBBB.  No significant change since prior tracing on 12/29/2021.  Lab Results  Component Value Date   WBC 6.0 11/10/2021   HGB 12.8 11/10/2021   HCT 38.4 11/10/2021   MCV 92.4 11/10/2021   PLT 198.0 11/10/2021    Lab Results  Component Value Date   NA 141 05/12/2022   K 4.3 05/12/2022   CL 103 05/12/2022   CO2 31 05/12/2022   BUN 29 (H) 05/12/2022   CREATININE 0.72 05/12/2022   GLUCOSE 96 05/12/2022   ALT 16 11/10/2021    Lab Results  Component Value Date   CHOL 160 11/10/2021   HDL 51.80 11/10/2021   LDLCALC 86 11/10/2021   TRIG 112.0 11/10/2021   CHOLHDL 3 11/10/2021    --------------------------------------------------------------------------------------------------  ASSESSMENT AND PLAN: Valvular heart disease: Ms. Ascher reports stable NYHA class II symptoms.  Continue current medications.  Nonobstructive CAD and hyperlipidemia: No angina reported.  We will check a lipid panel and CMP today to assess response to ezetimibe therapy and target LDL < 70 (statin-intolerant).  Hypertension: BP mildly elevated today but typically better at home.  Continue current medications with sodium restriction.  Ms. Spain should contact us if her BP is consistently at or above 140/90.  Follow-up: Return to clinic in 1 year.  Nelva Bush, MD 10/06/2022 10:16 AM

## 2022-10-07 ENCOUNTER — Encounter: Payer: Self-pay | Admitting: Internal Medicine

## 2022-10-11 ENCOUNTER — Telehealth: Payer: Self-pay

## 2022-10-11 ENCOUNTER — Other Ambulatory Visit (HOSPITAL_COMMUNITY): Payer: Self-pay

## 2022-10-11 ENCOUNTER — Telehealth: Payer: Self-pay | Admitting: Family Medicine

## 2022-10-11 NOTE — Telephone Encounter (Signed)
Patient called to get talk about her lab results and also wanted to know when is her prolia shot is due. Call back number [phone].

## 2022-10-11 NOTE — Telephone Encounter (Signed)
Spoke with patient and she is questioning her lab work she had at the hospital; looks like Dr. Saunders Revel responded to her via mychart and recommended another medication. Patient does not want to take anymore medication and wants to know if there is something she can do to get her levels down? Advised patient that her prolia is currently being worked on by the PA team and she will be notified of next steps once completed.

## 2022-10-11 NOTE — Telephone Encounter (Signed)
I will defer to cardiology- I agree with the rec's from cards.  I don't expect she'll be able to get her LDL down to goal w/o a med change.  Thanks.

## 2022-10-11 NOTE — Telephone Encounter (Signed)
Prolia VOB initiated via [email or web address]  Last Prolia inj: 05/25/22 Next Prolia inj DUE: 11/26/21

## 2022-10-11 NOTE — Telephone Encounter (Signed)
Pharmacy Patient Advocate Encounter   Received notification from [facility] that prior authorization for Prolia 60MG/ML syringes is required/requested.  Per Test Claim: Covered through part D, Copay is $95.00   PA submitted on 10/11/22 to [place] via [place]   Status is pending

## 2022-10-12 ENCOUNTER — Telehealth: Payer: Self-pay | Admitting: Internal Medicine

## 2022-10-12 DIAGNOSIS — E785 Hyperlipidemia, unspecified: Secondary | ICD-10-CM

## 2022-10-12 NOTE — Telephone Encounter (Signed)
*  STAT* If patient is at the pharmacy, call can be transferred to refill team.   1. Which medications need to be refilled? (please list name of each medication and dose if known)  ezetimibe (ZETIA)   2. Which pharmacy/location (including street and city if local pharmacy) is medication to be sent to? [place], [place]   3. Do they need a 30 day or 90 day supply?   90 day supply  Patient is requesting to have this medication increased. She states during 12/01 appointment with Dr. Saunders Revel he advised that this medication can be increased if the patient would like. She states she does not know how many MG it needs to be increased to, but she would like to have this sent to her pharmacy and she would like a call back discuss.

## 2022-10-12 NOTE — Telephone Encounter (Signed)
Pt ready for scheduling on or after 11/26/22  Out-of-pocket cost due at time of visit: $301  Primary: Medicare Prolia co-insurance: 20% (approximately $276) Admin fee co-insurance: 20% (approximately $25)  Secondary:  Prolia co-insurance:  Admin fee co-insurance:   Deductible:   Prior Auth:  PA# [id number]  Valid: 05/24/22-11/06/23  ** This summary of benefits is an estimation of the patient's out-of-pocket cost. Exact cost may vary based on individual plan coverage.

## 2022-10-12 NOTE — Telephone Encounter (Signed)
Spoke with patient and she is okay with everything and will discuss with cardio further.

## 2022-10-12 NOTE — Telephone Encounter (Signed)
Patient Advocate Encounter  Prior Authorization for  Prolia 60MG/ML syringes  has been approved.    PA# [id number] Key: [id number]  Effective dates: 05/24/22 through 11/06/23

## 2022-10-13 MED ORDER — PRAVASTATIN SODIUM 10 MG PO TABS: 10.0000 mg | ORAL_TABLET | Freq: Every evening | ORAL | 3 refills | Status: DC

## 2022-10-13 NOTE — Telephone Encounter (Signed)
Please have Ms. Aaron start pravastatin 10 mg daily with repeat lipid panel and ALT in ~3 months.  She should continue the remainder of her medications, including ezetimibe, and let us know if any side effects arise with addition of pravastatin.  Nelva Bush, MD [facility]

## 2022-10-13 NOTE — Telephone Encounter (Signed)
Patient has been called and made aware. Pravastatin 10 mg once daily has been sent in and lab orders placed.

## 2022-10-13 NOTE — Telephone Encounter (Signed)
Returned the call to the patient. She stated that she is willing to try the addition of a low dose statin.    Nelva Bush, MD 10/11/2022 12:00 PM EST Back to Top    LDL above goal.  CMP stable.  Recommend adding low-dose statin to ezetimibe to see if LDL can be brought below 70 (intolerant of atorvastatin and rosuvastatin in the past).  MyChart message sent to patient.

## 2022-10-19 ENCOUNTER — Encounter (INDEPENDENT_AMBULATORY_CARE_PROVIDER_SITE_OTHER): Payer: Medicare HMO | Admitting: Ophthalmology

## 2022-10-19 DIAGNOSIS — H43813 Vitreous degeneration, bilateral: Secondary | ICD-10-CM | POA: Diagnosis not present

## 2022-10-19 DIAGNOSIS — H353221 Exudative age-related macular degeneration, left eye, with active choroidal neovascularization: Secondary | ICD-10-CM | POA: Diagnosis not present

## 2022-10-19 DIAGNOSIS — H353114 Nonexudative age-related macular degeneration, right eye, advanced atrophic with subfoveal involvement: Secondary | ICD-10-CM | POA: Diagnosis not present

## 2022-10-19 DIAGNOSIS — H353123 Nonexudative age-related macular degeneration, left eye, advanced atrophic without subfoveal involvement: Secondary | ICD-10-CM | POA: Diagnosis not present

## 2022-10-24 DIAGNOSIS — M41125 Adolescent idiopathic scoliosis, thoracolumbar region: Secondary | ICD-10-CM | POA: Diagnosis not present

## 2022-10-24 DIAGNOSIS — M955 Acquired deformity of pelvis: Secondary | ICD-10-CM | POA: Diagnosis not present

## 2022-10-24 DIAGNOSIS — M9903 Segmental and somatic dysfunction of lumbar region: Secondary | ICD-10-CM | POA: Diagnosis not present

## 2022-10-24 DIAGNOSIS — M9905 Segmental and somatic dysfunction of pelvic region: Secondary | ICD-10-CM | POA: Diagnosis not present

## 2022-10-26 ENCOUNTER — Other Ambulatory Visit: Payer: Self-pay | Admitting: Family Medicine

## 2022-11-02 NOTE — Telephone Encounter (Signed)
Patient not due until after 11/25/22 please resubmit benefits after first of year.

## 2022-11-07 ENCOUNTER — Telehealth: Payer: Self-pay | Admitting: Internal Medicine

## 2022-11-07 ENCOUNTER — Telehealth: Payer: Self-pay | Admitting: Family Medicine

## 2022-11-07 DIAGNOSIS — M810 Age-related osteoporosis without current pathological fracture: Secondary | ICD-10-CM

## 2022-11-07 NOTE — Telephone Encounter (Signed)
Pt stated she had a prolia shot back in 05/2022 & pt stated she thinks she was told by Damita Dunnings she should have a bone scan before having another prolia shot but she isn't certain. Pt is asking for confirmation of that? Call back # [phone]

## 2022-11-07 NOTE — Telephone Encounter (Signed)
Spoke with the patient about her pravastatin.She stated that the medication was causing her joint pain and has not stopped it. The symptoms have stopped since discontinuing the medication.

## 2022-11-07 NOTE — Telephone Encounter (Signed)
The patient has been made aware and verbalized her understanding. She has been educated on appropriate foods and exercise.

## 2022-11-07 NOTE — Telephone Encounter (Signed)
Pt c/o medication issue:  1. Name of Medication: pravastatin (PRAVACHOL) 10 MG tablet   2. How are you currently taking this medication (dosage and times per day)? As written  3. Are you having a reaction (difficulty breathing--STAT)? No   4. What is your medication issue? Patient has been having difficulty with medication due to joint and muscle pain

## 2022-11-07 NOTE — Telephone Encounter (Signed)
Left a message for the patient to call back.  

## 2022-11-07 NOTE — Telephone Encounter (Signed)
Please let Susan [name] know that I recommend against increasing ezetimibe.  She should continue ezetimibe 10 mg daily and stop pravastatin.  I encourage her to double down on lifestyle modifications (diet and exercise) to help improve her lipids.  We should repeat a lipid panel in ~3 months.  Thanks.  Nelva Bush, MD [facility]

## 2022-11-07 NOTE — Addendum Note (Signed)
Addended by: Tonia Ghent on: 11/07/2022 03:07 PM   Modules accepted: Orders

## 2022-11-07 NOTE — Telephone Encounter (Signed)
She is due for f/u DXA, ordered.  Let me know if she has trouble getting scheduled.   Wouldn't have to delay next dose of prolia.  Thanks.

## 2022-11-08 NOTE — Telephone Encounter (Signed)
Called patient and reviewed all information. Patient verbalized understanding. Will call if any further questions.  

## 2022-11-09 NOTE — Telephone Encounter (Signed)
Prolia VOB initiated via [email or web address] 

## 2022-11-13 ENCOUNTER — Ambulatory Visit: Payer: Medicare HMO | Admitting: Family Medicine

## 2022-11-13 ENCOUNTER — Other Ambulatory Visit (HOSPITAL_COMMUNITY): Payer: Self-pay

## 2022-11-13 NOTE — Telephone Encounter (Signed)
Pt ready for scheduling on or after 11/25/22  Out-of-pocket cost due at time of visit: $301  Primary: Medicare Prolia co-insurance: 20% (approximately $276) Admin fee co-insurance: 20% (approximately $25)  Secondary:  Prolia co-insurance:  Admin fee co-insurance:   Deductible: $0  Prior Auth: per CMM not needed PA# Valid:   ** This summary of benefits is an estimation of the patient's out-of-pocket cost. Exact cost may vary based on individual plan coverage.

## 2022-11-14 DIAGNOSIS — M9905 Segmental and somatic dysfunction of pelvic region: Secondary | ICD-10-CM | POA: Diagnosis not present

## 2022-11-14 DIAGNOSIS — M955 Acquired deformity of pelvis: Secondary | ICD-10-CM | POA: Diagnosis not present

## 2022-11-14 DIAGNOSIS — M9903 Segmental and somatic dysfunction of lumbar region: Secondary | ICD-10-CM | POA: Diagnosis not present

## 2022-11-14 DIAGNOSIS — M41125 Adolescent idiopathic scoliosis, thoracolumbar region: Secondary | ICD-10-CM | POA: Diagnosis not present

## 2022-11-20 NOTE — Telephone Encounter (Signed)
The order is in the EMR.  I thought she would get a call about that.  Apparently she has to call to schedule.  I put in the contact info below.  Please have her call and let me know if she can't get scheduled with that.  Thanks.   [facility] [address] [place], [place] [address] [place]

## 2022-11-20 NOTE — Telephone Encounter (Signed)
Patient notified to call and schedule dexa scan. Order has been faxed over to [facility] imaging center.

## 2022-11-20 NOTE — Telephone Encounter (Signed)
Pt called to schedule prolia shot. Pt stated Susan [name] stated he'd send off for a bone density scan for pt, pt stated she hasn't gotten any calls to schedule the ov. Pt is asking for update. Call back # [phone]

## 2022-11-21 NOTE — Telephone Encounter (Signed)
Patient called in and was wanting to know if it would ok to wait and have the Bone Scan done in August. She stated that her insurance would pay for it then because it would be 2 years since her last one. Please advise. Thank you!

## 2022-11-21 NOTE — Telephone Encounter (Signed)
Called pt and informed her that it would be ok.

## 2022-11-21 NOTE — Telephone Encounter (Signed)
Should be fine to do then.  Thanks.

## 2022-11-28 ENCOUNTER — Other Ambulatory Visit: Payer: Self-pay

## 2022-11-28 DIAGNOSIS — M810 Age-related osteoporosis without current pathological fracture: Secondary | ICD-10-CM

## 2022-11-28 NOTE — Telephone Encounter (Signed)
Spoke to patient over phone.  Aware of $301 due at time of Prolia inj.  Labs scheduled in [place] on 01/01/23, Prolia sch at [place] on 01/03/23.

## 2022-12-04 ENCOUNTER — Other Ambulatory Visit: Payer: Self-pay | Admitting: Family Medicine

## 2022-12-04 ENCOUNTER — Telehealth: Payer: Self-pay | Admitting: Family Medicine

## 2022-12-04 DIAGNOSIS — E538 Deficiency of other specified B group vitamins: Secondary | ICD-10-CM

## 2022-12-04 DIAGNOSIS — I10 Essential (primary) hypertension: Secondary | ICD-10-CM

## 2022-12-04 DIAGNOSIS — M810 Age-related osteoporosis without current pathological fracture: Secondary | ICD-10-CM

## 2022-12-04 DIAGNOSIS — E559 Vitamin D deficiency, unspecified: Secondary | ICD-10-CM

## 2022-12-04 NOTE — Telephone Encounter (Signed)
Patient called in and stated that she is having her labs drawn on 12/12/22 at [facility]. She was wanting to make sure her labs for her physical is put in prior to going for the draw. Please advise.

## 2022-12-04 NOTE — Telephone Encounter (Signed)
Patient was due for medicare wellness this month. Please call patient to get scheduled

## 2022-12-05 DIAGNOSIS — M9905 Segmental and somatic dysfunction of pelvic region: Secondary | ICD-10-CM | POA: Diagnosis not present

## 2022-12-05 DIAGNOSIS — M955 Acquired deformity of pelvis: Secondary | ICD-10-CM | POA: Diagnosis not present

## 2022-12-05 DIAGNOSIS — M41125 Adolescent idiopathic scoliosis, thoracolumbar region: Secondary | ICD-10-CM | POA: Diagnosis not present

## 2022-12-05 DIAGNOSIS — M9903 Segmental and somatic dysfunction of lumbar region: Secondary | ICD-10-CM | POA: Diagnosis not present

## 2022-12-06 NOTE — Telephone Encounter (Signed)
Done. Thanks.

## 2022-12-06 NOTE — Addendum Note (Signed)
Addended by: Tonia Ghent on: 12/06/2022 09:25 PM   Modules accepted: Orders

## 2022-12-07 NOTE — Telephone Encounter (Signed)
Patient notified

## 2022-12-12 ENCOUNTER — Ambulatory Visit: Payer: Medicare HMO

## 2022-12-12 ENCOUNTER — Other Ambulatory Visit (INDEPENDENT_AMBULATORY_CARE_PROVIDER_SITE_OTHER): Payer: Medicare HMO

## 2022-12-12 DIAGNOSIS — E538 Deficiency of other specified B group vitamins: Secondary | ICD-10-CM

## 2022-12-12 DIAGNOSIS — I10 Essential (primary) hypertension: Secondary | ICD-10-CM | POA: Diagnosis not present

## 2022-12-12 DIAGNOSIS — M810 Age-related osteoporosis without current pathological fracture: Secondary | ICD-10-CM | POA: Diagnosis not present

## 2022-12-12 DIAGNOSIS — E559 Vitamin D deficiency, unspecified: Secondary | ICD-10-CM

## 2022-12-12 LAB — CBC WITH DIFFERENTIAL/PLATELET
Basophils Absolute: 0 10*3/uL (ref 0.0–0.1)
Basophils Relative: 0.7 % (ref 0.0–3.0)
Eosinophils Absolute: 0 10*3/uL (ref 0.0–0.7)
Eosinophils Relative: 0.8 % (ref 0.0–5.0)
HCT: 37.7 % (ref 36.0–46.0)
Hemoglobin: 12.7 g/dL (ref 12.0–15.0)
Lymphocytes Relative: 31.6 % (ref 12.0–46.0)
Lymphs Abs: 1.8 10*3/uL (ref 0.7–4.0)
MCHC: 33.8 g/dL (ref 30.0–36.0)
MCV: 94.1 fl (ref 78.0–100.0)
Monocytes Absolute: 0.4 10*3/uL (ref 0.1–1.0)
Monocytes Relative: 7.2 % (ref 3.0–12.0)
Neutro Abs: 3.4 10*3/uL (ref 1.4–7.7)
Neutrophils Relative %: 59.7 % (ref 43.0–77.0)
Platelets: 194 10*3/uL (ref 150.0–400.0)
RBC: 4.01 Mil/uL (ref 3.87–5.11)
RDW: 13.3 % (ref 11.5–15.5)
WBC: 5.8 10*3/uL (ref 4.0–10.5)

## 2022-12-12 LAB — VITAMIN B12: Vitamin B-12: 471 pg/mL (ref 211–911)

## 2022-12-12 LAB — COMPREHENSIVE METABOLIC PANEL
ALT: 21 U/L (ref 0–35)
AST: 28 U/L (ref 0–37)
Albumin: 4.4 g/dL (ref 3.5–5.2)
Alkaline Phosphatase: 43 U/L (ref 39–117)
BUN: 22 mg/dL (ref 6–23)
CO2: 27 mEq/L (ref 19–32)
Calcium: 9.6 mg/dL (ref 8.4–10.5)
Chloride: 103 mEq/L (ref 96–112)
Creatinine, Ser: 0.69 mg/dL (ref 0.40–1.20)
GFR: 83.69 mL/min (ref >60.00)
Glucose, Bld: 99 mg/dL (ref 70–99)
Potassium: 4.1 mEq/L (ref 3.5–5.1)
Sodium: 140 mEq/L (ref 135–145)
Total Bilirubin: 0.7 mg/dL (ref 0.2–1.2)
Total Protein: 7.2 g/dL (ref 6.0–8.3)

## 2022-12-12 LAB — TSH: TSH: 3.4 u[IU]/mL (ref 0.35–5.50)

## 2022-12-12 LAB — LIPID PANEL
Cholesterol: 138 mg/dL (ref 0–200)
HDL: 43.4 mg/dL (ref >39.00)
LDL Cholesterol: 70 mg/dL (ref 0–99)
NonHDL: 94.85
Total CHOL/HDL Ratio: 3
Triglycerides: 125 mg/dL (ref 0.0–149.0)
VLDL: 25 mg/dL (ref 0.0–40.0)

## 2022-12-12 LAB — VITAMIN D 25 HYDROXY (VIT D DEFICIENCY, FRACTURES): VITD: 36.75 ng/mL (ref 30.00–100.00)

## 2022-12-14 ENCOUNTER — Ambulatory Visit (INDEPENDENT_AMBULATORY_CARE_PROVIDER_SITE_OTHER): Payer: Medicare HMO | Admitting: Family Medicine

## 2022-12-14 ENCOUNTER — Encounter: Payer: Self-pay | Admitting: Family Medicine

## 2022-12-14 VITALS — BP 118/62 | HR 83 | Temp 97.7°F | Ht 59.0 in | Wt 132.0 lb

## 2022-12-14 DIAGNOSIS — Z23 Encounter for immunization: Secondary | ICD-10-CM | POA: Diagnosis not present

## 2022-12-14 DIAGNOSIS — I509 Heart failure, unspecified: Secondary | ICD-10-CM | POA: Diagnosis not present

## 2022-12-14 DIAGNOSIS — E785 Hyperlipidemia, unspecified: Secondary | ICD-10-CM | POA: Diagnosis not present

## 2022-12-14 DIAGNOSIS — I10 Essential (primary) hypertension: Secondary | ICD-10-CM

## 2022-12-14 DIAGNOSIS — M810 Age-related osteoporosis without current pathological fracture: Secondary | ICD-10-CM | POA: Diagnosis not present

## 2022-12-14 DIAGNOSIS — M549 Dorsalgia, unspecified: Secondary | ICD-10-CM | POA: Diagnosis not present

## 2022-12-14 DIAGNOSIS — Z0000 Encounter for general adult medical examination without abnormal findings: Secondary | ICD-10-CM

## 2022-12-14 DIAGNOSIS — Z7189 Other specified counseling: Secondary | ICD-10-CM

## 2022-12-14 DIAGNOSIS — I110 Hypertensive heart disease with heart failure: Secondary | ICD-10-CM | POA: Diagnosis not present

## 2022-12-14 MED ORDER — VITAMIN D3 50 MCG (2000 UT) PO CAPS: 2000.0000 [IU] | ORAL_CAPSULE | Freq: Every day | ORAL | Status: DC

## 2022-12-14 MED ORDER — BACLOFEN 10 MG PO TABS: ORAL_TABLET | ORAL | 3 refills | Status: DC

## 2022-12-14 MED ORDER — DENOSUMAB 60 MG/ML ~~LOC~~ SOSY
60.0000 mg | PREFILLED_SYRINGE | Freq: Once | SUBCUTANEOUS | Status: AC
  Administered 2022-12-14: 60 mg via SUBCUTANEOUS

## 2022-12-14 NOTE — Progress Notes (Signed)
Hypertension:    Using medication without problems or lightheadedness: yes Chest pain with exertion:no Edema:occ L>R BE edema.   Short of breath: she is walking a city block at baseline.  She is trying to increase her distance.   Labs d/w pt.   Elevated Cholesterol: Using medications without problems: yes Muscle aches: no Diet compliance: d/w pt.   Exercise: d/w pt.    Osteoporosis hx.  Taking vit D at baseline.  S/p 5 years of fosamax. On prolia. Vit d d/w pt.   Vaccines d/w pt.   Colonoscopy 2017- d/w pt.   Breast cancer screening- done 2022 Advance directive- oldest daughter Maudie Mercury designated if patient were incapacitated.    Walking with cane when outside, fall cautions d/w pt.  Posture improved with cane use.  She is putting up with back pain at baseline.  Chiropractor tx helped.  Taking baclofen prn, usually QD, occ BID.    PMH and SH reviewed  Meds, vitals, and allergies reviewed.   ROS: Per HPI unless specifically indicated in ROS section   GEN: nad, alert and oriented HEENT: ncat NECK: supple w/o LA CV: rrr. PULM: ctab, no inc wob ABD: soft, +bs EXT: no edema SKIN: no acute rash

## 2022-12-14 NOTE — Patient Instructions (Addendum)
Please call the GI clinic.  Please let me know if you have trouble getting set up with your mammogram or bone density test.   Don't change your meds for now.  Take care.  Glad to see you.

## 2022-12-17 NOTE — Assessment & Plan Note (Signed)
Vaccines d/w pt.   Colonoscopy 2017- d/w pt.   Breast cancer screening- done 2022 Advance directive- oldest daughter Maudie Mercury designated if patient were incapacitated.

## 2022-12-17 NOTE — Assessment & Plan Note (Signed)
Fall cautions discussed with patient.  Discussed using a cane.  Continue baclofen prn, usually QD, occ BID.

## 2022-12-17 NOTE — Assessment & Plan Note (Signed)
Advance directive- oldest daughter Kim designated if patient were incapacitated.   °

## 2022-12-17 NOTE — Assessment & Plan Note (Signed)
Taking vit D at baseline.  S/p 5 years of fosamax. On prolia. Vit d d/w pt. continue as is.

## 2022-12-17 NOTE — Assessment & Plan Note (Signed)
Continue Zetia.  Statin intolerant.  Labs discussed with patient.

## 2022-12-17 NOTE — Assessment & Plan Note (Signed)
Continue lisinopril hydrochlorothiazide.  Labs discussed with patient.

## 2022-12-26 ENCOUNTER — Ambulatory Visit (INDEPENDENT_AMBULATORY_CARE_PROVIDER_SITE_OTHER): Payer: Medicare HMO

## 2022-12-26 VITALS — Ht 60.0 in | Wt 132.0 lb

## 2022-12-26 DIAGNOSIS — Z0000 Encounter for general adult medical examination without abnormal findings: Secondary | ICD-10-CM

## 2022-12-26 DIAGNOSIS — M41125 Adolescent idiopathic scoliosis, thoracolumbar region: Secondary | ICD-10-CM | POA: Diagnosis not present

## 2022-12-26 DIAGNOSIS — M955 Acquired deformity of pelvis: Secondary | ICD-10-CM | POA: Diagnosis not present

## 2022-12-26 DIAGNOSIS — M9905 Segmental and somatic dysfunction of pelvic region: Secondary | ICD-10-CM | POA: Diagnosis not present

## 2022-12-26 DIAGNOSIS — M9903 Segmental and somatic dysfunction of lumbar region: Secondary | ICD-10-CM | POA: Diagnosis not present

## 2022-12-26 NOTE — Progress Notes (Signed)
I connected with  RAHINI STIFTER on 12/26/22 by a audio enabled telemedicine application and verified that I am speaking with the correct person using two identifiers.  Patient Location: Home  Provider Location: Office/Clinic  I discussed the limitations of evaluation and management by telemedicine. The patient expressed understanding and agreed to proceed.  Subjective:   Susan [name] is a 77 y.o. female who presents for Medicare Annual (Subsequent) preventive examination.  Review of Systems         Objective:    There were no vitals filed for this visit. There is no height or weight on file to calculate BMI.     11/11/2021    1:20 PM 01/01/2021    2:23 PM 11/14/2020    1:00 AM 11/13/2020    9:27 PM 04/23/2019   11:46 AM 01/25/2018   11:07 AM 07/13/2016    9:30 AM  Advanced Directives  Does Patient Have a Medical Advance Directive? Yes No Yes Yes Yes Yes No  Type of Paramedic of [place];Living will  [place];Living will  [place];Living will [place];Living will   Does patient want to make changes to medical advance directive? Yes (MAU/Ambulatory/Procedural Areas - Information given)  No - Patient declined  No - Patient declined    Copy of [place] in Chart?   No - copy requested  No - copy requested No - copy requested   Would patient like information on creating a medical advance directive?       Yes - Educational materials given    Current Medications (verified) Outpatient Encounter Medications as of 12/26/2022  Medication Sig   acetaminophen (TYLENOL) 500 MG tablet Take 750 mg by mouth 2 (two) times daily.   aspirin 81 MG tablet Take 81 mg by mouth daily.   baclofen (LIORESAL) 10 MG tablet TAKE 1 TABLET BY MOUTH TWICE DAILY AS NEEDED FOR MUSCLE SPASM   Cholecalciferol (VITAMIN D3) 50 MCG (2000 UT) capsule Take 1 capsule (2,000 Units total) by mouth daily.   ezetimibe  (ZETIA) 10 MG tablet Take 1 tablet (10 mg total) by mouth daily.   lisinopril-hydrochlorothiazide (ZESTORETIC) 20-25 MG tablet Take 1 tablet by mouth daily.   vitamin B-12 (CYANOCOBALAMIN) 1000 MCG tablet Take 1,000 mcg by mouth daily.   No facility-administered encounter medications on file as of 12/26/2022.    Allergies (verified) Lipitor [atorvastatin calcium], Penicillins, and Sulfonamide derivatives   History: Past Medical History:  Diagnosis Date   Colon cancer screening    Diverticulosis of colon    Exudative age-related macular degeneration of right eye with active choroidal neovascularization ([place]) 03/23/2020   Hyperlipidemia    Hypertension    IBS (irritable bowel syndrome)    Macular degeneration    Migraine, unspecified, without mention of intractable migraine without mention of status migrainosus    NASH (nonalcoholic steatohepatitis)    Nonobstructive atherosclerosis of coronary artery 2016   Cardiac CTA with mild proximal LAD disease; CAC score 74.   Ocular migraine    Osteoporosis    on Fosamax for 5 years   Other abnormal glucose    Other chronic nonalcoholic liver disease    LFTs normalized with weight loss 2013   Other screening mammogram    Symptomatic menopausal or female climacteric states    Thyroid disease    Tubular adenoma of colon    Urinary tract infection, site not specified    Past Surgical History:  Procedure Laterality Date   abdominal ultrasound  04/12/2004 & 6/06   positive gallstones   ANKLE FRACTURE SURGERY  ~2002   pinning   BTL  30+ years ago   CATARACT EXTRACTION  2013   B eyes   COLONOSCOPY  06/08/06   Polyps, divertics (Dr. Sharlett Iles)   CT abdomen  04/29/04   Positive gallstones   INTRAMEDULLARY (IM) NAIL INTERTROCHANTERIC Left 11/14/2020   Procedure: INTRAMEDULLARY (IM) NAIL INTERTROCHANTRIC;  Surgeon: Earnestine Leys, MD;  Location: [facility] ORS;  Service: Orthopedics;  Laterality: Left;   TONSILLECTOMY AND ADENOIDECTOMY  Age 89    ultrasound pelvis  6/06   Negative   Family History  Problem Relation Age of Onset   Depression Mother        Manic depression   Diabetes Mother        Type 2   Breast cancer Mother    Heart disease Mother    Colon polyps Father    Heart attack Father 41   Breast cancer Maternal Aunt    Breast cancer Paternal Aunt    Crohn's disease Brother    Osteoporosis Brother    Cancer Neg Hx    Colon cancer Neg Hx    Social History   Socioeconomic History   Marital status: Married    Spouse name: Not on file   Number of children: 2   Years of education: 13   Highest education level: Not on file  Occupational History   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 Lives with husband, Enjoys times with 5 grandkids, 1 great grandchild   Social Determinants of Health   Financial Resource Strain: Low Risk  (11/11/2021)   Overall Financial Resource Strain (CARDIA)    Difficulty of Paying Living Expenses: Not very hard  Food Insecurity: No Food Insecurity (11/11/2021)   Hunger Vital Sign    Worried About Running Out of Food in the Last Year: Never true    Ran Out of Food in the Last Year: Never true  Transportation Needs: No Transportation Needs (11/11/2021)   PRAPARE - Hydrologist (Medical): No    Lack of Transportation (Non-Medical): No  Physical Activity: Insufficiently Active (11/11/2021)   Exercise Vital Sign    Days of Exercise per Week: 7 days    Minutes of  Exercise per Session: 20 min  Stress: No Stress Concern Present (11/11/2021)   [place]    Feeling of Stress : Not at all  Social Connections: Moderately Isolated (11/11/2021)   Social Connection and Isolation Panel [NHANES]    Frequency of Communication with Friends and Family: More than three times a week    Frequency of Social Gatherings with Friends and Family: Three times a week    Attends Religious Services: Never  Active Member of Clubs or Organizations: No    Attends Archivist Meetings: Never    Marital Status: Married    Tobacco Counseling Counseling given: Not Answered Tobacco comments: Quit in 2001   Clinical Intake:                 Diabetic?no       Activities of Daily Living     No data to display          Patient Care Team: Tonia Ghent, MD as PCP - General End, Harrell Gave, MD as PCP - Cardiology (Cardiology) Celesta Gentile, MD as Consulting Physician (Cardiology) Lorelee Cover., MD as Referring Physician (Ophthalmology) Zadie Rhine Clent Demark, MD as Consulting Physician (Ophthalmology) Charlton Haws, [facility] as Pharmacist (Pharmacist)  Indicate any recent Medical Services you may have received from other than [facility] providers in the past year (date may be approximate).     Assessment:   This is a routine wellness examination for Serena.  Hearing/Vision screen No results found.  Dietary issues and exercise activities discussed:     Goals Addressed   None    Depression Screen    12/14/2022   11:40 AM 11/11/2021    1:25 PM 05/30/2021   11:31 AM 04/23/2019   11:45 AM 01/25/2018   11:07 AM 07/13/2016    9:30 AM 03/22/2015   10:58 AM  PHQ 2/9 Scores  PHQ - 2 Score 1 1 0 0 0 0 0  PHQ- 9 Score 5  0 0 1      Fall Risk    12/14/2022   11:40 AM 11/11/2021    1:23 PM 05/30/2021   11:32 AM 04/23/2019   11:45 AM 01/25/2018   11:07 AM  Fall Risk   Falls in the past  year? 0 0 1 0 No  Number falls in past yr: 0 0 0    Injury with Fall? 0 0 1    Risk for fall due to : No Fall Risks Orthopedic patient     Follow up Falls evaluation completed Falls prevention discussed       FALL RISK PREVENTION PERTAINING TO THE HOME:  Any stairs in or around the home? No  If so, are there any without handrails? No  Home free of loose throw rugs in walkways, pet beds, electrical cords, etc? Yes  Adequate lighting in your home to reduce risk of falls? Yes   ASSISTIVE DEVICES UTILIZED TO PREVENT FALLS:  Life alert? No  Use of a cane, walker or w/c? Yes cane sometimes outside Grab bars in the bathroom? Yes  Shower chair or bench in shower? Yes  Elevated toilet seat or a handicapped toilet? No    Cognitive Function:    04/23/2019   12:00 PM 01/25/2018   11:04 AM 07/13/2016    9:45 AM  MMSE - Mini Mental State Exam  Orientation to time 5 5 5  $ Orientation to Place 5 5 5  $ Registration 3 3 3  $ Attention/ Calculation 0 0 0  Recall 3 2 3  $ Recall-comments  unable to recall 1 of 3 words   Language- name 2 objects 0 0 0  Language- repeat 1 1 1  $ Language- follow 3 step command 0 3 3  Language- read & follow direction 0 0 0  Write a sentence 0 0 0  Copy design 0 0 0  Total score 17 19 20        $ Immunizations Immunization History  Administered Date(s)  Administered   Fluad Quad(high Dose 65+) 11/14/2021, 12/14/2022   Influenza Whole 08/12/2010   Influenza, Seasonal, Injecte, Preservative Fre 12/13/2012   Influenza,inj,Quad PF,6+ Mos 08/25/2013, 09/03/2014, 07/13/2016, 12/13/2017   Moderna Covid-19 Vaccine Bivalent Booster 70yr & up 02/18/2022   PFIZER(Purple Top)SARS-COV-2 Vaccination 12/29/2019, 01/19/2020, 09/20/2020   Pneumococcal Conjugate-13 03/22/2015   Pneumococcal Polysaccharide-23 08/12/2010   Td 11/06/1998, 08/12/2010    TDAP status: Up to date  Flu Vaccine status: Up to date  Pneumococcal vaccine status: Up to date  Covid-19 vaccine status:  Completed vaccines  Qualifies for Shingles Vaccine? Yes   Zostavax completed No   Shingrix Completed?: No.    Education has been provided regarding the importance of this vaccine. Patient has been advised to call insurance company to determine out of pocket expense if they have not yet received this vaccine. Advised may also receive vaccine at local pharmacy or Health Dept. Verbalized acceptance and understanding.  Screening Tests Health Maintenance  Topic Date Due   Zoster Vaccines- Shingrix (1 of 2) Never done   DTaP/Tdap/Td (3 - Tdap) 08/12/2020   COLONOSCOPY (Pts 45-444yrInsurance coverage will need to be confirmed)  03/22/2021   COVID-19 Vaccine (5 - 2023-24 season) 12/30/2022 (Originally 07/07/2022)   [place]ic[place] Annual Wellness ([place] 12/15/2023   Pneumonia Vaccine 5573Years old  Completed   INFLUENZA VACCINE  Completed   DEXA SCAN  Completed   Hepatitis C Screening  Completed   HPV VACCINES  Aged Out    Health Maintenance  Health Maintenance Due  Topic Date Due   Zoster Vaccines- Shingrix (1 of 2) Never done   DTaP/Tdap/Td (3 - Tdap) 08/12/2020   COLONOSCOPY (Pts 45-4932yrnsurance coverage will need to be confirmed)  03/22/2021    Colorectal cancer screening: No longer required.   Mammogram status: No longer required due to age.  Bone Density status: Completed yes. Results reflect: Bone density results: OSTEOPOROSIS. Repeat every 5 years. Bone scan scheduled in august  Lung Cancer Screening: (Low Dose CT Chest recommended if Age 89-31-80ars, 30 pack-year currently smoking OR have quit w/in 15years.) does not qualify.   Lung Cancer Screening Referral: no  Additional Screening:  Hepatitis C Screening: does not qualify; Completed yes in past  Vision Screening: Recommended annual ophthalmology exams for early detection of glaucoma and other disorders of the eye. Is the patient up to date with their annual eye exam?  Yes  Who is the provider or what is the name of the  office in which the patient attends annual eye exams? Dr BelGloriann Loan pt is not established with a provider, would they like to be referred to a provider to establish care? No .   Dental Screening: Recommended annual dental exams for proper oral hygiene  Community Resource Referral / Chronic Care Management: CRR required this visit?  No   CCM required this visit?  No     Plan:     I have personally reviewed and noted the following in the patient's chart:   Medical and social history Use of alcohol, tobacco or illicit drugs  Current medications and supplements including opioid prescriptions. Patient is not currently taking opioid prescriptions. Functional ability and status Nutritional status Physical activity Advanced directives List of other physicians Hospitalizations, surgeries, and ER visits in previous 12 months Vitals Screenings to include cognitive, depression, and falls Referrals and appointments  In addition, I have reviewed and discussed with patient certain preventive protocols, quality metrics, and best practice recommendations. A written personalized  care plan for preventive services as well as general preventive health recommendations were provided to patient.     Roger Shelter, LPN   [id number]   Nurse Notes: pt states she is physically doing alright. Does relay a little stress due to water leak under the house and arguments with spouse at times. She indicates she is dealing with it alright at this time.  Pt requires a hearing test (preferably in [place]) and order for MMG after August.

## 2022-12-26 NOTE — Patient Instructions (Addendum)
[name] , Thank you for taking time to come for your Medicare Wellness Visit. I appreciate your ongoing commitment to your health goals. Please review the following plan we discussed and let me know if I can assist you in the future.   These are the goals we discussed:  Goals      Follow up with Primary Care Provider     Starting 04/23/19, I will continue to take medications as prescribed and to keep appointments with PCP as scheduled.      Patient Stated     Would like to lose more weight     Track and Manage My Blood Pressure-Hypertension     Timeframe:  Long-Range Goal Priority:  Medium Start Date:       01/11/22                      Expected End Date:   01/12/23                    Follow Up Date Sept 2023   - check blood pressure 3 times per week - choose a place to take my blood pressure (home, clinic or office, retail store) - write blood pressure results in a log or diary    Why is this important?   You won't feel high blood pressure, but it can still hurt your blood vessels.  High blood pressure can cause heart or kidney problems. It can also cause a stroke.  Making lifestyle changes like losing a little weight or eating less salt will help.  Checking your blood pressure at home and at different times of the day can help to control blood pressure.  If the doctor prescribes medicine remember to take it the way the doctor ordered.  Call the office if you cannot afford the medicine or if there are questions about it.     Notes:         This is a list of the screening recommended for you and due dates:  Health Maintenance  Topic Date Due   Zoster (Shingles) Vaccine (1 of 2) Never done   DTaP/Tdap/Td vaccine (3 - Tdap) 08/12/2020   Colon Cancer Screening  03/22/2021   COVID-19 Vaccine (5 - 2023-24 season) 12/30/2022*   Medicare Annual Wellness Visit  12/27/2023   Pneumonia Vaccine  Completed   Flu Shot  Completed   DEXA scan (bone density measurement)  Completed    Hepatitis C Screening: USPSTF Recommendation to screen - Ages 18-79 yo.  Completed   HPV Vaccine  Aged Out  *Topic was postponed. The date shown is not the original due date.    Advanced directives: yes  Conditions/risks identified: none  Next appointment: Follow up in one year for your annual wellness visit 01/01/2024 @1100$  telephone   Preventive Care 65 Years and Older, Female Preventive care refers to lifestyle choices and visits with your health care provider that can promote health and wellness. What does preventive care include? A yearly physical exam. This is also called an annual well check. Dental exams once or twice a year. Routine eye exams. Ask your health care provider how often you should have your eyes checked. Personal lifestyle choices, including: Daily care of your teeth and gums. Regular physical activity. Eating a healthy diet. Avoiding tobacco and drug use. Limiting alcohol use. Practicing safe sex. Taking low-dose aspirin every day. Taking vitamin and mineral supplements as recommended by your health care provider. What happens during  an annual well check? The services and screenings done by your health care provider during your annual well check will depend on your age, overall health, lifestyle risk factors, and family history of disease. Counseling  Your health care provider may ask you questions about your: Alcohol use. Tobacco use. Drug use. Emotional well-being. Home and relationship well-being. Sexual activity. Eating habits. History of falls. Memory and ability to understand (cognition). Work and work Statistician. Reproductive health. Screening  You may have the following tests or measurements: Height, weight, and BMI. Blood pressure. Lipid and cholesterol levels. These may be checked every 5 years, or more frequently if you are over 28 years old. Skin check. Lung cancer screening. You may have this screening every year starting at age 78  if you have a 30-pack-year history of smoking and currently smoke or have quit within the past 15 years. Fecal occult blood test (FOBT) of the stool. You may have this test every year starting at age 48. Flexible sigmoidoscopy or colonoscopy. You may have a sigmoidoscopy every 5 years or a colonoscopy every 10 years starting at age 40. Hepatitis C blood test. Hepatitis B blood test. Sexually transmitted disease (STD) testing. Diabetes screening. This is done by checking your blood sugar (glucose) after you have not eaten for a while (fasting). You may have this done every 1-3 years. Bone density scan. This is done to screen for osteoporosis. You may have this done starting at age 31. Mammogram. This may be done every 1-2 years. Talk to your health care provider about how often you should have regular mammograms. Talk with your health care provider about your test results, treatment options, and if necessary, the need for more tests. Vaccines  Your health care provider may recommend certain vaccines, such as: Influenza vaccine. This is recommended every year. Tetanus, diphtheria, and acellular pertussis (Tdap, Td) vaccine. You may need a Td booster every 10 years. Zoster vaccine. You may need this after age 62. Pneumococcal 13-valent conjugate (PCV13) vaccine. One dose is recommended after age 61. Pneumococcal polysaccharide (PPSV23) vaccine. One dose is recommended after age 53. Talk to your health care provider about which screenings and vaccines you need and how often you need them. This information is not intended to replace advice given to you by your health care provider. Make sure you discuss any questions you have with your health care provider. Document Released: 11/19/2015 Document Revised: 07/12/2016 Document Reviewed: 08/24/2015 Elsevier Interactive Patient Education  2017 [employer] Prevention in the Home Falls can cause injuries. They can happen to people of all ages.  There are many things you can do to make your home safe and to help prevent falls. What can I do on the outside of my home? Regularly fix the edges of walkways and driveways and fix any cracks. Remove anything that might make you trip as you walk through a door, such as a raised step or threshold. Trim any bushes or trees on the path to your home. Use bright outdoor lighting. Clear any walking paths of anything that might make someone trip, such as rocks or tools. Regularly check to see if handrails are loose or broken. Make sure that both sides of any steps have handrails. Any raised decks and porches should have guardrails on the edges. Have any leaves, snow, or ice cleared regularly. Use sand or salt on walking paths during winter. Clean up any spills in your garage right away. This includes oil or grease spills. What can  I do in the bathroom? Use night lights. Install grab bars by the toilet and in the tub and shower. Do not use towel bars as grab bars. Use non-skid mats or decals in the tub or shower. If you need to sit down in the shower, use a plastic, non-slip stool. Keep the floor dry. Clean up any water that spills on the floor as soon as it happens. Remove soap buildup in the tub or shower regularly. Attach bath mats securely with double-sided non-slip rug tape. Do not have throw rugs and other things on the floor that can make you trip. What can I do in the bedroom? Use night lights. Make sure that you have a light by your bed that is easy to reach. Do not use any sheets or blankets that are too big for your bed. They should not hang down onto the floor. Have a firm chair that has side arms. You can use this for support while you get dressed. Do not have throw rugs and other things on the floor that can make you trip. What can I do in the kitchen? Clean up any spills right away. Avoid walking on wet floors. Keep items that you use a lot in easy-to-reach places. If you need  to reach something above you, use a strong step stool that has a grab bar. Keep electrical cords out of the way. Do not use floor polish or wax that makes floors slippery. If you must use wax, use non-skid floor wax. Do not have throw rugs and other things on the floor that can make you trip. What can I do with my stairs? Do not leave any items on the stairs. Make sure that there are handrails on both sides of the stairs and use them. Fix handrails that are broken or loose. Make sure that handrails are as long as the stairways. Check any carpeting to make sure that it is firmly attached to the stairs. Fix any carpet that is loose or worn. Avoid having throw rugs at the top or bottom of the stairs. If you do have throw rugs, attach them to the floor with carpet tape. Make sure that you have a light switch at the top of the stairs and the bottom of the stairs. If you do not have them, ask someone to add them for you. What else can I do to help prevent falls? Wear shoes that: Do not have high heels. Have rubber bottoms. Are comfortable and fit you well. Are closed at the toe. Do not wear sandals. If you use a stepladder: Make sure that it is fully opened. Do not climb a closed stepladder. Make sure that both sides of the stepladder are locked into place. Ask someone to hold it for you, if possible. Clearly mark and make sure that you can see: Any grab bars or handrails. First and last steps. Where the edge of each step is. Use tools that help you move around (mobility aids) if they are needed. These include: Canes. Walkers. Scooters. Crutches. Turn on the lights when you go into a dark area. Replace any light bulbs as soon as they burn out. Set up your furniture so you have a clear path. Avoid moving your furniture around. If any of your floors are uneven, fix them. If there are any pets around you, be aware of where they are. Review your medicines with your doctor. Some medicines can  make you feel dizzy. This can increase your chance of falling.  Ask your doctor what other things that you can do to help prevent falls. This information is not intended to replace advice given to you by your health care provider. Make sure you discuss any questions you have with your health care provider. Document Released: 08/19/2009 Document Revised: 03/30/2016 Document Reviewed: 11/27/2014 Elsevier Interactive Patient Education  2017 [employer].

## 2023-01-01 ENCOUNTER — Other Ambulatory Visit: Payer: Medicare HMO

## 2023-01-03 ENCOUNTER — Ambulatory Visit: Payer: Medicare HMO

## 2023-01-04 ENCOUNTER — Telehealth: Payer: Self-pay

## 2023-01-04 DIAGNOSIS — Z0110 Encounter for examination of ears and hearing without abnormal findings: Secondary | ICD-10-CM

## 2023-01-04 NOTE — Telephone Encounter (Signed)
I called patient to speak to her about Prolia.  While on the phone, she mentioned that she was told that she would be getting a referral for a hearing test.  She would like a call back with information about that referral.

## 2023-01-07 DIAGNOSIS — H9190 Unspecified hearing loss, unspecified ear: Secondary | ICD-10-CM | POA: Insufficient documentation

## 2023-01-07 NOTE — Telephone Encounter (Signed)
Ordered. Thanks

## 2023-01-07 NOTE — Addendum Note (Signed)
Addended by: Tonia Ghent on: 01/07/2023 02:05 PM   Modules accepted: Orders

## 2023-01-08 ENCOUNTER — Encounter: Payer: Self-pay | Admitting: *Deleted

## 2023-01-08 NOTE — Telephone Encounter (Signed)
Patient notified referral was done.

## 2023-01-11 NOTE — Telephone Encounter (Signed)
Per chart review, pt had labs completed at pcp office on 12/12/22. Will forward to Dr. Saunders Revel for review.

## 2023-01-16 NOTE — Telephone Encounter (Signed)
Labs from 12/12/2022 look okay with improved cholesterol.  Continue current medications and follow-up as previously recommended.  Nelva Bush, MD [facility]

## 2023-01-17 NOTE — Telephone Encounter (Signed)
Pt made aware of lab results along with MD's recommendations Pt verbalized understanding.  

## 2023-01-23 DIAGNOSIS — M9905 Segmental and somatic dysfunction of pelvic region: Secondary | ICD-10-CM | POA: Diagnosis not present

## 2023-01-23 DIAGNOSIS — M41125 Adolescent idiopathic scoliosis, thoracolumbar region: Secondary | ICD-10-CM | POA: Diagnosis not present

## 2023-01-23 DIAGNOSIS — M955 Acquired deformity of pelvis: Secondary | ICD-10-CM | POA: Diagnosis not present

## 2023-01-23 DIAGNOSIS — M9903 Segmental and somatic dysfunction of lumbar region: Secondary | ICD-10-CM | POA: Diagnosis not present

## 2023-01-31 DIAGNOSIS — M41125 Adolescent idiopathic scoliosis, thoracolumbar region: Secondary | ICD-10-CM | POA: Diagnosis not present

## 2023-01-31 DIAGNOSIS — M9903 Segmental and somatic dysfunction of lumbar region: Secondary | ICD-10-CM | POA: Diagnosis not present

## 2023-01-31 DIAGNOSIS — M955 Acquired deformity of pelvis: Secondary | ICD-10-CM | POA: Diagnosis not present

## 2023-01-31 DIAGNOSIS — M9905 Segmental and somatic dysfunction of pelvic region: Secondary | ICD-10-CM | POA: Diagnosis not present

## 2023-02-05 DIAGNOSIS — M9903 Segmental and somatic dysfunction of lumbar region: Secondary | ICD-10-CM | POA: Diagnosis not present

## 2023-02-05 DIAGNOSIS — M9905 Segmental and somatic dysfunction of pelvic region: Secondary | ICD-10-CM | POA: Diagnosis not present

## 2023-02-05 DIAGNOSIS — M41125 Adolescent idiopathic scoliosis, thoracolumbar region: Secondary | ICD-10-CM | POA: Diagnosis not present

## 2023-02-05 DIAGNOSIS — M955 Acquired deformity of pelvis: Secondary | ICD-10-CM | POA: Diagnosis not present

## 2023-02-14 DIAGNOSIS — M41125 Adolescent idiopathic scoliosis, thoracolumbar region: Secondary | ICD-10-CM | POA: Diagnosis not present

## 2023-02-14 DIAGNOSIS — M9905 Segmental and somatic dysfunction of pelvic region: Secondary | ICD-10-CM | POA: Diagnosis not present

## 2023-02-14 DIAGNOSIS — M955 Acquired deformity of pelvis: Secondary | ICD-10-CM | POA: Diagnosis not present

## 2023-02-14 DIAGNOSIS — M9903 Segmental and somatic dysfunction of lumbar region: Secondary | ICD-10-CM | POA: Diagnosis not present

## 2023-02-26 ENCOUNTER — Telehealth: Payer: Self-pay | Admitting: Family Medicine

## 2023-02-26 DIAGNOSIS — H9190 Unspecified hearing loss, unspecified ear: Secondary | ICD-10-CM

## 2023-02-26 NOTE — Telephone Encounter (Signed)
Pt called stating Para March had sent in a referral for a hearing test & the company the referral went to, she doesn't trust the company. Pt stated she scheduled an ov with [facility] for thurs, 4/25, but needs a referral from [place]. Call back # [phone].

## 2023-02-27 ENCOUNTER — Encounter: Payer: Self-pay | Admitting: *Deleted

## 2023-02-27 DIAGNOSIS — M41125 Adolescent idiopathic scoliosis, thoracolumbar region: Secondary | ICD-10-CM | POA: Diagnosis not present

## 2023-02-27 DIAGNOSIS — M9903 Segmental and somatic dysfunction of lumbar region: Secondary | ICD-10-CM | POA: Diagnosis not present

## 2023-02-27 DIAGNOSIS — M9905 Segmental and somatic dysfunction of pelvic region: Secondary | ICD-10-CM | POA: Diagnosis not present

## 2023-02-27 DIAGNOSIS — M955 Acquired deformity of pelvis: Secondary | ICD-10-CM | POA: Diagnosis not present

## 2023-02-27 NOTE — Telephone Encounter (Signed)
I put in the referral.  Thanks.  

## 2023-02-27 NOTE — Addendum Note (Signed)
Addended by: Joaquim Nam on: 02/27/2023 07:59 AM   Modules accepted: Orders

## 2023-03-13 DIAGNOSIS — M9905 Segmental and somatic dysfunction of pelvic region: Secondary | ICD-10-CM | POA: Diagnosis not present

## 2023-03-13 DIAGNOSIS — M9903 Segmental and somatic dysfunction of lumbar region: Secondary | ICD-10-CM | POA: Diagnosis not present

## 2023-03-13 DIAGNOSIS — M41125 Adolescent idiopathic scoliosis, thoracolumbar region: Secondary | ICD-10-CM | POA: Diagnosis not present

## 2023-03-13 DIAGNOSIS — M955 Acquired deformity of pelvis: Secondary | ICD-10-CM | POA: Diagnosis not present

## 2023-03-22 DIAGNOSIS — H43812 Vitreous degeneration, left eye: Secondary | ICD-10-CM | POA: Diagnosis not present

## 2023-03-22 DIAGNOSIS — H353114 Nonexudative age-related macular degeneration, right eye, advanced atrophic with subfoveal involvement: Secondary | ICD-10-CM | POA: Diagnosis not present

## 2023-03-22 DIAGNOSIS — H353123 Nonexudative age-related macular degeneration, left eye, advanced atrophic without subfoveal involvement: Secondary | ICD-10-CM | POA: Diagnosis not present

## 2023-03-22 DIAGNOSIS — H43813 Vitreous degeneration, bilateral: Secondary | ICD-10-CM | POA: Diagnosis not present

## 2023-03-22 DIAGNOSIS — H43811 Vitreous degeneration, right eye: Secondary | ICD-10-CM | POA: Diagnosis not present

## 2023-03-27 DIAGNOSIS — M9905 Segmental and somatic dysfunction of pelvic region: Secondary | ICD-10-CM | POA: Diagnosis not present

## 2023-03-27 DIAGNOSIS — M9903 Segmental and somatic dysfunction of lumbar region: Secondary | ICD-10-CM | POA: Diagnosis not present

## 2023-03-27 DIAGNOSIS — M41125 Adolescent idiopathic scoliosis, thoracolumbar region: Secondary | ICD-10-CM | POA: Diagnosis not present

## 2023-03-27 DIAGNOSIS — M955 Acquired deformity of pelvis: Secondary | ICD-10-CM | POA: Diagnosis not present

## 2023-04-09 DIAGNOSIS — H353114 Nonexudative age-related macular degeneration, right eye, advanced atrophic with subfoveal involvement: Secondary | ICD-10-CM | POA: Diagnosis not present

## 2023-04-09 DIAGNOSIS — H353123 Nonexudative age-related macular degeneration, left eye, advanced atrophic without subfoveal involvement: Secondary | ICD-10-CM | POA: Diagnosis not present

## 2023-04-09 DIAGNOSIS — H43813 Vitreous degeneration, bilateral: Secondary | ICD-10-CM | POA: Diagnosis not present

## 2023-04-17 DIAGNOSIS — M955 Acquired deformity of pelvis: Secondary | ICD-10-CM | POA: Diagnosis not present

## 2023-04-17 DIAGNOSIS — M9903 Segmental and somatic dysfunction of lumbar region: Secondary | ICD-10-CM | POA: Diagnosis not present

## 2023-04-17 DIAGNOSIS — M9905 Segmental and somatic dysfunction of pelvic region: Secondary | ICD-10-CM | POA: Diagnosis not present

## 2023-04-17 DIAGNOSIS — M41125 Adolescent idiopathic scoliosis, thoracolumbar region: Secondary | ICD-10-CM | POA: Diagnosis not present

## 2023-05-28 DIAGNOSIS — M41125 Adolescent idiopathic scoliosis, thoracolumbar region: Secondary | ICD-10-CM | POA: Diagnosis not present

## 2023-05-28 DIAGNOSIS — M9903 Segmental and somatic dysfunction of lumbar region: Secondary | ICD-10-CM | POA: Diagnosis not present

## 2023-05-28 DIAGNOSIS — M9905 Segmental and somatic dysfunction of pelvic region: Secondary | ICD-10-CM | POA: Diagnosis not present

## 2023-05-28 DIAGNOSIS — M955 Acquired deformity of pelvis: Secondary | ICD-10-CM | POA: Diagnosis not present

## 2023-05-29 DIAGNOSIS — H43813 Vitreous degeneration, bilateral: Secondary | ICD-10-CM | POA: Diagnosis not present

## 2023-05-29 DIAGNOSIS — H353114 Nonexudative age-related macular degeneration, right eye, advanced atrophic with subfoveal involvement: Secondary | ICD-10-CM | POA: Diagnosis not present

## 2023-05-29 DIAGNOSIS — H353123 Nonexudative age-related macular degeneration, left eye, advanced atrophic without subfoveal involvement: Secondary | ICD-10-CM | POA: Diagnosis not present

## 2023-06-12 DIAGNOSIS — M810 Age-related osteoporosis without current pathological fracture: Secondary | ICD-10-CM | POA: Diagnosis not present

## 2023-06-12 DIAGNOSIS — Z1231 Encounter for screening mammogram for malignant neoplasm of breast: Secondary | ICD-10-CM | POA: Diagnosis not present

## 2023-06-12 LAB — HM MAMMOGRAPHY

## 2023-06-12 LAB — HM DEXA SCAN

## 2023-06-21 ENCOUNTER — Encounter: Payer: Self-pay | Admitting: Family Medicine

## 2023-06-21 ENCOUNTER — Ambulatory Visit (INDEPENDENT_AMBULATORY_CARE_PROVIDER_SITE_OTHER): Payer: Medicare HMO | Admitting: Family Medicine

## 2023-06-21 VITALS — BP 128/60 | HR 83 | Temp 97.6°F | Ht 60.0 in | Wt 132.0 lb

## 2023-06-21 DIAGNOSIS — G5601 Carpal tunnel syndrome, right upper limb: Secondary | ICD-10-CM | POA: Diagnosis not present

## 2023-06-21 MED ORDER — IBUPROFEN 200 MG PO TABS: 400.0000 mg | ORAL_TABLET | Freq: Two times a day (BID) | ORAL | Status: AC

## 2023-06-21 NOTE — Progress Notes (Signed)
Tingling. In right arm to elbox for about one year or so. Has talked to cardiologist about this and does not think its heart related. Arm throbs at night. Does have carpel tunnel hx noted- prev NCV results d/w pt. Was episodic, more frequent now but not constant.  No L hand sx.  Using cane in R hand.  No R foot sx.  She has episodic altered but not total loss of sensation. Advil helps some, nsaid cautions d/w pt. She is R handed.  Not using a wrist brace.    Discussed that she is worried about her husband who has COPD.  She is putting up with macular degeneration at baseline.    Meds, vitals, and allergies reviewed.   ROS: Per HPI unless specifically indicated in ROS section   Nad [place] Neck supple, no LA Grip is still normal B.  Diffuse chronic IP joint changes B.  R wrist tinel positive.   She has scar tissue on the R 2nd finger- longstanding Altered but not absent monofilament sensation on the B hands.   Skin well perfused.

## 2023-06-21 NOTE — Assessment & Plan Note (Signed)
Discussed options. No weakness.   She can try an over the counter wrist brace and see if that helps.  She can wear it at night or during the day.  If that isn't helping then let me know. Ibuprofen with food.   She agrees.  We can refer to hand clinic if needed.

## 2023-06-21 NOTE — Patient Instructions (Addendum)
Try an over the counter wrist brace and see if that helps.  You can wear it at night or during the day.  If that isn't helping then let me know. Ibuprofen with food.

## 2023-07-19 ENCOUNTER — Other Ambulatory Visit: Payer: Self-pay | Admitting: Internal Medicine

## 2023-07-23 DIAGNOSIS — H353114 Nonexudative age-related macular degeneration, right eye, advanced atrophic with subfoveal involvement: Secondary | ICD-10-CM | POA: Diagnosis not present

## 2023-07-23 DIAGNOSIS — H43812 Vitreous degeneration, left eye: Secondary | ICD-10-CM | POA: Diagnosis not present

## 2023-07-23 DIAGNOSIS — H353221 Exudative age-related macular degeneration, left eye, with active choroidal neovascularization: Secondary | ICD-10-CM | POA: Diagnosis not present

## 2023-07-23 DIAGNOSIS — H43811 Vitreous degeneration, right eye: Secondary | ICD-10-CM | POA: Diagnosis not present

## 2023-07-23 DIAGNOSIS — H353123 Nonexudative age-related macular degeneration, left eye, advanced atrophic without subfoveal involvement: Secondary | ICD-10-CM | POA: Diagnosis not present

## 2023-07-23 DIAGNOSIS — H43813 Vitreous degeneration, bilateral: Secondary | ICD-10-CM | POA: Diagnosis not present

## 2023-08-28 DIAGNOSIS — H43813 Vitreous degeneration, bilateral: Secondary | ICD-10-CM | POA: Diagnosis not present

## 2023-08-28 DIAGNOSIS — H353221 Exudative age-related macular degeneration, left eye, with active choroidal neovascularization: Secondary | ICD-10-CM | POA: Diagnosis not present

## 2023-08-28 DIAGNOSIS — H3562 Retinal hemorrhage, left eye: Secondary | ICD-10-CM | POA: Diagnosis not present

## 2023-08-28 DIAGNOSIS — H353114 Nonexudative age-related macular degeneration, right eye, advanced atrophic with subfoveal involvement: Secondary | ICD-10-CM | POA: Diagnosis not present

## 2023-08-28 DIAGNOSIS — H353123 Nonexudative age-related macular degeneration, left eye, advanced atrophic without subfoveal involvement: Secondary | ICD-10-CM | POA: Diagnosis not present

## 2023-09-11 DIAGNOSIS — H3562 Retinal hemorrhage, left eye: Secondary | ICD-10-CM | POA: Diagnosis not present

## 2023-09-11 DIAGNOSIS — H353114 Nonexudative age-related macular degeneration, right eye, advanced atrophic with subfoveal involvement: Secondary | ICD-10-CM | POA: Diagnosis not present

## 2023-09-11 DIAGNOSIS — H353221 Exudative age-related macular degeneration, left eye, with active choroidal neovascularization: Secondary | ICD-10-CM | POA: Diagnosis not present

## 2023-09-11 DIAGNOSIS — H43813 Vitreous degeneration, bilateral: Secondary | ICD-10-CM | POA: Diagnosis not present

## 2023-09-11 DIAGNOSIS — H353123 Nonexudative age-related macular degeneration, left eye, advanced atrophic without subfoveal involvement: Secondary | ICD-10-CM | POA: Diagnosis not present

## 2023-09-27 DIAGNOSIS — H353123 Nonexudative age-related macular degeneration, left eye, advanced atrophic without subfoveal involvement: Secondary | ICD-10-CM | POA: Diagnosis not present

## 2023-09-27 DIAGNOSIS — H43813 Vitreous degeneration, bilateral: Secondary | ICD-10-CM | POA: Diagnosis not present

## 2023-09-27 DIAGNOSIS — H353221 Exudative age-related macular degeneration, left eye, with active choroidal neovascularization: Secondary | ICD-10-CM | POA: Diagnosis not present

## 2023-09-27 DIAGNOSIS — H3562 Retinal hemorrhage, left eye: Secondary | ICD-10-CM | POA: Diagnosis not present

## 2023-09-27 DIAGNOSIS — H353114 Nonexudative age-related macular degeneration, right eye, advanced atrophic with subfoveal involvement: Secondary | ICD-10-CM | POA: Diagnosis not present

## 2023-09-30 ENCOUNTER — Other Ambulatory Visit: Payer: Self-pay | Admitting: Internal Medicine

## 2023-10-01 NOTE — Telephone Encounter (Signed)
last visit 10/06/22 with plan to f/u in 12 months.  Next visit: 10/24/23

## 2023-10-22 ENCOUNTER — Other Ambulatory Visit: Payer: Self-pay | Admitting: Internal Medicine

## 2023-10-23 ENCOUNTER — Encounter: Payer: Self-pay | Admitting: Family Medicine

## 2023-10-23 DIAGNOSIS — H353114 Nonexudative age-related macular degeneration, right eye, advanced atrophic with subfoveal involvement: Secondary | ICD-10-CM | POA: Diagnosis not present

## 2023-10-23 DIAGNOSIS — H353221 Exudative age-related macular degeneration, left eye, with active choroidal neovascularization: Secondary | ICD-10-CM | POA: Diagnosis not present

## 2023-10-23 DIAGNOSIS — H43813 Vitreous degeneration, bilateral: Secondary | ICD-10-CM | POA: Diagnosis not present

## 2023-10-23 DIAGNOSIS — H353123 Nonexudative age-related macular degeneration, left eye, advanced atrophic without subfoveal involvement: Secondary | ICD-10-CM | POA: Diagnosis not present

## 2023-10-23 DIAGNOSIS — H3562 Retinal hemorrhage, left eye: Secondary | ICD-10-CM | POA: Diagnosis not present

## 2023-10-24 ENCOUNTER — Ambulatory Visit: Payer: Medicare HMO | Admitting: Internal Medicine

## 2023-11-05 ENCOUNTER — Encounter: Payer: Self-pay | Admitting: Family Medicine

## 2023-11-05 ENCOUNTER — Ambulatory Visit: Payer: Medicare HMO | Admitting: Family Medicine

## 2023-11-05 VITALS — BP 128/70 | HR 96 | Temp 98.8°F | Ht 60.0 in | Wt 134.1 lb

## 2023-11-05 DIAGNOSIS — R051 Acute cough: Secondary | ICD-10-CM

## 2023-11-05 DIAGNOSIS — J029 Acute pharyngitis, unspecified: Secondary | ICD-10-CM

## 2023-11-05 DIAGNOSIS — U071 COVID-19: Secondary | ICD-10-CM

## 2023-11-05 LAB — POCT RAPID STREP A (OFFICE): Rapid Strep A Screen: NEGATIVE

## 2023-11-05 LAB — POC COVID19 BINAXNOW: SARS Coronavirus 2 Ag: POSITIVE — AB

## 2023-11-05 MED ORDER — NIRMATRELVIR/RITONAVIR (PAXLOVID)TABLET: 3.0000 | ORAL_TABLET | Freq: Two times a day (BID) | ORAL | 0 refills | Status: AC

## 2023-11-05 NOTE — Patient Instructions (Signed)
Start paxlovid.  Rest and fluids.  Update [place] as needed.  Take care.  Glad to see you.

## 2023-11-06 DIAGNOSIS — U071 COVID-19: Secondary | ICD-10-CM | POA: Insufficient documentation

## 2023-11-06 NOTE — Assessment & Plan Note (Signed)
 Start paxlovid.  Rest and fluids.  Update [place] as needed. Routine covid cautions d/w pt.  Her husband has presumably already been exposed but he is feeling better in the meantime.

## 2023-11-07 HISTORY — PX: CARPAL TUNNEL RELEASE: SHX101

## 2023-11-15 DIAGNOSIS — H43813 Vitreous degeneration, bilateral: Secondary | ICD-10-CM | POA: Diagnosis not present

## 2023-11-15 DIAGNOSIS — H353221 Exudative age-related macular degeneration, left eye, with active choroidal neovascularization: Secondary | ICD-10-CM | POA: Diagnosis not present

## 2023-11-15 DIAGNOSIS — H353114 Nonexudative age-related macular degeneration, right eye, advanced atrophic with subfoveal involvement: Secondary | ICD-10-CM | POA: Diagnosis not present

## 2023-11-15 DIAGNOSIS — H353123 Nonexudative age-related macular degeneration, left eye, advanced atrophic without subfoveal involvement: Secondary | ICD-10-CM | POA: Diagnosis not present

## 2023-11-15 DIAGNOSIS — H3562 Retinal hemorrhage, left eye: Secondary | ICD-10-CM | POA: Diagnosis not present

## 2023-12-05 ENCOUNTER — Telehealth: Payer: Self-pay

## 2023-12-05 DIAGNOSIS — E559 Vitamin D deficiency, unspecified: Secondary | ICD-10-CM

## 2023-12-05 DIAGNOSIS — M810 Age-related osteoporosis without current pathological fracture: Secondary | ICD-10-CM

## 2023-12-05 DIAGNOSIS — E785 Hyperlipidemia, unspecified: Secondary | ICD-10-CM

## 2023-12-05 DIAGNOSIS — E538 Deficiency of other specified B group vitamins: Secondary | ICD-10-CM

## 2023-12-05 NOTE — Telephone Encounter (Signed)
I put in the lab orders.  Please schedule.  Thanks.

## 2023-12-05 NOTE — Telephone Encounter (Signed)
Patient contacted and scheduled.

## 2023-12-05 NOTE — Telephone Encounter (Signed)
Copied from CRM [id number]. Topic: Clinical - Request for Lab/Test Order >> Dec 05, 2023 12:48 PM Hector Shade B wrote: Reason for CRM: Patient is requesting labs before her AWV on 01/01/2024 patient is requesting a return call to advise her if she needs any labs done before the AWV.

## 2023-12-05 NOTE — Telephone Encounter (Signed)
Copied from CRM [id number]. Topic: Clinical - Medication Question >> Dec 05, 2023  1:01 PM Suzette B wrote: Reason for CRM: Patient wants to know when she has her next Prolia injection. Please call [phone]

## 2023-12-06 NOTE — Telephone Encounter (Signed)
Would continue prolia.  Thanks.

## 2023-12-06 NOTE — Telephone Encounter (Signed)
Per chart review looks like patient last one was 2/24. Ok to restart injections?

## 2023-12-07 ENCOUNTER — Telehealth: Payer: Self-pay

## 2023-12-07 MED ORDER — DENOSUMAB 60 MG/ML ~~LOC~~ SOSY: 60.0000 mg | PREFILLED_SYRINGE | Freq: Once | SUBCUTANEOUS | Status: AC

## 2023-12-07 NOTE — Telephone Encounter (Signed)
 Prolia VOB initiated via [email or web address]  Next Prolia inj DUE: NOW

## 2023-12-07 NOTE — Addendum Note (Signed)
Addended by: Donnamarie Poag on: 12/07/2023 01:17 PM   Modules accepted: Orders

## 2023-12-07 NOTE — Telephone Encounter (Signed)
I have started order for prolia please verify benefits.

## 2023-12-14 NOTE — Telephone Encounter (Signed)
 Pharmacy Patient Advocate Encounter   Received notification from  [employer]  that prior authorization for PROLIA  is required/requested.   Insurance verification completed.   The patient is insured through [employer] .   Per test claim: PA required; PA submitted to above mentioned insurance via CoverMyMeds Key/confirmation #/EOC [id number] Status is pending

## 2023-12-17 ENCOUNTER — Other Ambulatory Visit (HOSPITAL_COMMUNITY): Payer: Self-pay

## 2023-12-17 NOTE — Telephone Encounter (Signed)
 Pharmacy Patient Advocate Encounter  Received notification from [employer] that Prior Authorization for PROLIA  has been APPROVED from 05/24/22 to 11/05/24. Ran test claim, Copay is $930.81. This test claim was processed through [facility]- copay amounts may vary at other pharmacies due to pharmacy/plan contracts, or as the patient moves through the different stages of their insurance plan.   PA #/Case ID/Reference #: [id number]

## 2023-12-25 ENCOUNTER — Telehealth: Payer: Self-pay | Admitting: Family Medicine

## 2023-12-25 NOTE — Telephone Encounter (Signed)
Pt was on schedule for 12/23/23 since office is closed patient wants to come when she gets approval for her prolia shots and will wait to schedule labs when she is scheduled for her Prolia shot.

## 2023-12-26 ENCOUNTER — Other Ambulatory Visit: Payer: Medicare HMO

## 2023-12-27 ENCOUNTER — Ambulatory Visit: Payer: Medicare HMO | Admitting: Internal Medicine

## 2023-12-31 NOTE — Telephone Encounter (Signed)
 I don't see medical coverage option. Do you know what that cost will be for patient?

## 2023-12-31 NOTE — Telephone Encounter (Signed)
 Sent message to verify cost

## 2024-01-01 DIAGNOSIS — H3562 Retinal hemorrhage, left eye: Secondary | ICD-10-CM | POA: Diagnosis not present

## 2024-01-01 DIAGNOSIS — H02833 Dermatochalasis of right eye, unspecified eyelid: Secondary | ICD-10-CM | POA: Diagnosis not present

## 2024-01-01 DIAGNOSIS — H353221 Exudative age-related macular degeneration, left eye, with active choroidal neovascularization: Secondary | ICD-10-CM | POA: Diagnosis not present

## 2024-01-01 DIAGNOSIS — H02836 Dermatochalasis of left eye, unspecified eyelid: Secondary | ICD-10-CM | POA: Diagnosis not present

## 2024-01-01 DIAGNOSIS — H43813 Vitreous degeneration, bilateral: Secondary | ICD-10-CM | POA: Diagnosis not present

## 2024-01-01 DIAGNOSIS — H353123 Nonexudative age-related macular degeneration, left eye, advanced atrophic without subfoveal involvement: Secondary | ICD-10-CM | POA: Diagnosis not present

## 2024-01-01 DIAGNOSIS — H353114 Nonexudative age-related macular degeneration, right eye, advanced atrophic with subfoveal involvement: Secondary | ICD-10-CM | POA: Diagnosis not present

## 2024-01-01 NOTE — Telephone Encounter (Signed)
 Message has been sent to verify the cost for patient

## 2024-01-03 DIAGNOSIS — H02833 Dermatochalasis of right eye, unspecified eyelid: Secondary | ICD-10-CM | POA: Diagnosis not present

## 2024-01-03 DIAGNOSIS — H3562 Retinal hemorrhage, left eye: Secondary | ICD-10-CM | POA: Diagnosis not present

## 2024-01-03 DIAGNOSIS — H353221 Exudative age-related macular degeneration, left eye, with active choroidal neovascularization: Secondary | ICD-10-CM | POA: Diagnosis not present

## 2024-01-03 DIAGNOSIS — H43813 Vitreous degeneration, bilateral: Secondary | ICD-10-CM | POA: Diagnosis not present

## 2024-01-03 DIAGNOSIS — H02836 Dermatochalasis of left eye, unspecified eyelid: Secondary | ICD-10-CM | POA: Diagnosis not present

## 2024-01-03 DIAGNOSIS — H353123 Nonexudative age-related macular degeneration, left eye, advanced atrophic without subfoveal involvement: Secondary | ICD-10-CM | POA: Diagnosis not present

## 2024-01-03 DIAGNOSIS — H353114 Nonexudative age-related macular degeneration, right eye, advanced atrophic with subfoveal involvement: Secondary | ICD-10-CM | POA: Diagnosis not present

## 2024-01-04 ENCOUNTER — Other Ambulatory Visit: Payer: Medicare HMO

## 2024-01-04 DIAGNOSIS — E785 Hyperlipidemia, unspecified: Secondary | ICD-10-CM | POA: Diagnosis not present

## 2024-01-04 DIAGNOSIS — M810 Age-related osteoporosis without current pathological fracture: Secondary | ICD-10-CM

## 2024-01-04 DIAGNOSIS — E559 Vitamin D deficiency, unspecified: Secondary | ICD-10-CM

## 2024-01-04 DIAGNOSIS — E538 Deficiency of other specified B group vitamins: Secondary | ICD-10-CM | POA: Diagnosis not present

## 2024-01-04 LAB — CBC WITH DIFFERENTIAL/PLATELET
Basophils Absolute: 0 10*3/uL (ref 0.0–0.1)
Basophils Relative: 0.6 % (ref 0.0–3.0)
Eosinophils Absolute: 0.2 10*3/uL (ref 0.0–0.7)
Eosinophils Relative: 3.1 % (ref 0.0–5.0)
HCT: 39.8 % (ref 36.0–46.0)
Hemoglobin: 13.1 g/dL (ref 12.0–15.0)
Lymphocytes Relative: 33.9 % (ref 12.0–46.0)
Lymphs Abs: 1.7 10*3/uL (ref 0.7–4.0)
MCHC: 33 g/dL (ref 30.0–36.0)
MCV: 93.7 fL (ref 78.0–100.0)
Monocytes Absolute: 0.4 10*3/uL (ref 0.1–1.0)
Monocytes Relative: 8.1 % (ref 3.0–12.0)
Neutro Abs: 2.8 10*3/uL (ref 1.4–7.7)
Neutrophils Relative %: 54.3 % (ref 43.0–77.0)
Platelets: 201 10*3/uL (ref 150.0–400.0)
RBC: 4.25 Mil/uL (ref 3.87–5.11)
RDW: 14.3 % (ref 11.5–15.5)
WBC: 5.1 10*3/uL (ref 4.0–10.5)

## 2024-01-04 LAB — LIPID PANEL
Cholesterol: 144 mg/dL (ref 0–200)
HDL: 48.2 mg/dL (ref >39.00)
LDL Cholesterol: 79 mg/dL (ref 0–99)
NonHDL: 95.58
Total CHOL/HDL Ratio: 3
Triglycerides: 85 mg/dL (ref 0.0–149.0)
VLDL: 17 mg/dL (ref 0.0–40.0)

## 2024-01-04 LAB — COMPREHENSIVE METABOLIC PANEL
ALT: 14 U/L (ref 0–35)
AST: 20 U/L (ref 0–37)
Albumin: 4.2 g/dL (ref 3.5–5.2)
Alkaline Phosphatase: 48 U/L (ref 39–117)
BUN: 26 mg/dL — ABNORMAL HIGH (ref 6–23)
CO2: 31 meq/L (ref 19–32)
Calcium: 9.1 mg/dL (ref 8.4–10.5)
Chloride: 102 meq/L (ref 96–112)
Creatinine, Ser: 0.65 mg/dL (ref 0.40–1.20)
GFR: 84.27 mL/min (ref >60.00)
Glucose, Bld: 92 mg/dL (ref 70–99)
Potassium: 4 meq/L (ref 3.5–5.1)
Sodium: 141 meq/L (ref 135–145)
Total Bilirubin: 0.6 mg/dL (ref 0.2–1.2)
Total Protein: 6.9 g/dL (ref 6.0–8.3)

## 2024-01-04 LAB — VITAMIN B12: Vitamin B-12: 1000 pg/mL — ABNORMAL HIGH (ref 211–911)

## 2024-01-04 LAB — TSH: TSH: 4.37 u[IU]/mL (ref 0.35–5.50)

## 2024-01-04 LAB — VITAMIN D 25 HYDROXY (VIT D DEFICIENCY, FRACTURES): VITD: 30.36 ng/mL (ref 30.00–100.00)

## 2024-01-08 NOTE — Telephone Encounter (Signed)
 Placed multiple calls to [facility] on different days to check benefits for Prolia.

## 2024-01-08 NOTE — Telephone Encounter (Signed)
 Pt ready for scheduling for Prolia on or after : 01/08/24  Option# 1: Buy/Bill (Office supplied medication)  Out-of-pocket cost due at time of clinic visit: $15  Number of injection/visits approved: --  Primary: [employer] - Medicare Prolia co-insurance: $15 Admin fee co-insurance: --  Secondary: N/A Prolia co-insurance:  Admin fee co-insurance:   Medical Benefit Details: Date Benefits were checked: 01/08/24 Deductible: no/ Coinsurance: $15/ Admin Fee: --  Prior Auth: Approved PA# [id number] Expiration Date: 05/24/2022 to 11/05/2024  # of doses approved: ----------------------------------------------------------------------- Option# 2- Med Obtained from pharmacy:  Pharmacy benefit: Copay $930.81 (Paid to pharmacy) Admin Fee: 15 (Pay at clinic)  Prior Auth: Approved PA# [id number] Expiration Date: 05/24/2022 to 11/05/2024  # of doses approved:   If patient wants fill through the pharmacy benefit please send prescription to:  [facility] , and include estimated need by date in rx notes. Pharmacy will ship medication directly to the office.  Patient not eligible for Prolia Copay Card. Copay Card can make patient's cost as little as $25. Link to apply: [email or web address]://[email or web address].com/copay  ** This summary of benefits is an estimation of the patient's out-of-pocket cost. Exact cost may very based on individual plan coverage.

## 2024-01-09 NOTE — Telephone Encounter (Signed)
 Added to referral.  No further action needed at this time.

## 2024-01-09 NOTE — Telephone Encounter (Signed)
See referral for further documentation

## 2024-01-15 ENCOUNTER — Ambulatory Visit (INDEPENDENT_AMBULATORY_CARE_PROVIDER_SITE_OTHER)

## 2024-01-15 DIAGNOSIS — M810 Age-related osteoporosis without current pathological fracture: Secondary | ICD-10-CM | POA: Diagnosis not present

## 2024-01-15 MED ORDER — DENOSUMAB 60 MG/ML ~~LOC~~ SOSY: 60.0000 mg | PREFILLED_SYRINGE | Freq: Once | SUBCUTANEOUS | Status: DC

## 2024-01-15 MED ORDER — DENOSUMAB 60 MG/ML ~~LOC~~ SOSY
60.0000 mg | PREFILLED_SYRINGE | Freq: Once | SUBCUTANEOUS | Status: AC
  Administered 2024-01-15: 60 mg via SUBCUTANEOUS

## 2024-01-15 NOTE — Progress Notes (Signed)
 Per orders of Dr. Crawford Givens, injection of prolia 60 mg Noma given by Lewanda Rife in right deltoid. Patient tolerated injection well. Patient will make appointment for 6 month.

## 2024-01-29 ENCOUNTER — Telehealth: Payer: Self-pay | Admitting: Family Medicine

## 2024-01-29 NOTE — Telephone Encounter (Signed)
 Can you check for me and see what she is getting billed for?

## 2024-01-29 NOTE — Telephone Encounter (Signed)
 Copied from CRM [id number]. Topic: General - Other >> Jan 29, 2024  9:49 AM Rodman Pickle T wrote: Reason for CRM: patient is calling in regarding her injection for prolia  she is being charged she is not suppose to be charged she would like a call back regarding this issue

## 2024-01-30 NOTE — Telephone Encounter (Signed)
 [employer] paid, but the patient also has [employer]. It has not paid yet. The balance has been moved back to insurance responsibility.  [facility] may [facility] to [facility] if there is concerns about whether they will pay.

## 2024-02-01 NOTE — Telephone Encounter (Signed)
 Spoke to pt. She will wait to see if she receives a bill and will reach out to [place] then.

## 2024-02-08 ENCOUNTER — Ambulatory Visit

## 2024-02-08 VITALS — BP 110/68 | Ht 60.0 in | Wt 133.8 lb

## 2024-02-08 DIAGNOSIS — Z0000 Encounter for general adult medical examination without abnormal findings: Secondary | ICD-10-CM | POA: Diagnosis not present

## 2024-02-08 NOTE — Progress Notes (Signed)
 Subjective:   Susan [name] is a 78 y.o. who presents for a Medicare Wellness preventive visit.  Visit Complete: In person  Persons Participating in Visit: Patient.  AWV Questionnaire: No: Patient Medicare AWV questionnaire was not completed prior to this visit.  Cardiac Risk Factors include: advanced age (>12men, >47 women);dyslipidemia;hypertension;sedentary lifestyle     Objective:    Today's Vitals   02/08/24 0805 02/08/24 0807  Weight: 133 lb 12.8 oz (60.7 kg)   Height: 5' (1.524 m)   PainSc:  3    Body mass index is 26.13 kg/m.     02/08/2024    8:25 AM 12/26/2022   11:26 AM 11/11/2021    1:20 PM 01/01/2021    2:23 PM 11/14/2020    1:00 AM 11/13/2020    9:27 PM 04/23/2019   11:46 AM  Advanced Directives  Does Patient Have a Medical Advance Directive? Yes Yes Yes No Yes Yes Yes  Type of Estate agent of [place];Living will Healthcare Power of [place];Living will Healthcare Power of [place];Living will  Healthcare Power of [place];Living will  Healthcare Power of [place];Living will  Does patient want to make changes to medical advance directive?   Yes (MAU/Ambulatory/Procedural Areas - Information given)  No - Patient declined  No - Patient declined  Copy of Healthcare Power of Attorney in Chart? No - copy requested No - copy requested   No - copy requested  No - copy requested    Current Medications (verified) Outpatient Encounter Medications as of 02/08/2024  Medication Sig   acetaminophen (TYLENOL) 500 MG tablet Take 750 mg by mouth 2 (two) times daily.   aspirin 81 MG tablet Take 81 mg by mouth daily.   Cholecalciferol (VITAMIN D3) 50 MCG (2000 UT) capsule Take 1 capsule (2,000 Units total) by mouth daily.   ezetimibe (ZETIA) 10 MG tablet TAKE 1 TABLET EVERY DAY   ibuprofen (ADVIL) 200 MG tablet Take 2 tablets (400 mg total) by mouth in the morning and at bedtime. With food.   lisinopril-hydrochlorothiazide (ZESTORETIC) 20-25 MG tablet  TAKE 1 TABLET EVERY DAY   vitamin B-12 (CYANOCOBALAMIN) 1000 MCG tablet Take 1,000 mcg by mouth daily.   Facility-Administered Encounter Medications as of 02/08/2024  Medication   denosumab (PROLIA) injection 60 mg   denosumab (PROLIA) injection 60 mg    Allergies (verified) Lipitor [atorvastatin calcium], Penicillins, and Sulfonamide derivatives   History: Past Medical History:  Diagnosis Date   Colon cancer screening    Diverticulosis of colon    Exudative age-related macular degeneration of right eye with active choroidal neovascularization (HCC) 03/23/2020   Hyperlipidemia    Hypertension    IBS (irritable bowel syndrome)    Macular degeneration    Migraine, unspecified, without mention of intractable migraine without mention of status migrainosus    NASH (nonalcoholic steatohepatitis)    Nonobstructive atherosclerosis of coronary artery 2016   Cardiac CTA with mild proximal LAD disease; CAC score 74.   Ocular migraine    Osteoporosis    on Fosamax for 5 years   Other abnormal glucose    Other chronic nonalcoholic liver disease    LFTs normalized with weight loss 2013   Other screening mammogram    Symptomatic menopausal or female climacteric states    Thyroid disease    Tubular adenoma of colon    Urinary tract infection, site not specified    Past Surgical History:  Procedure Laterality Date   abdominal ultrasound  04/12/2004 & 6/06  positive gallstones   ANKLE FRACTURE SURGERY  ~2002   pinning   BTL  30+ years ago   CATARACT EXTRACTION  2013   B eyes   COLONOSCOPY  06/08/06   Polyps, divertics (Dr. Jarold Motto)   CT abdomen  04/29/04   Positive gallstones   INTRAMEDULLARY (IM) NAIL INTERTROCHANTERIC Left 11/14/2020   Procedure: INTRAMEDULLARY (IM) NAIL INTERTROCHANTRIC;  Surgeon: Deeann Saint, MD;  Location: [facility] ORS;  Service: Orthopedics;  Laterality: Left;   TONSILLECTOMY AND ADENOIDECTOMY  Age 58   ultrasound pelvis  6/06   Negative   Family History   Problem Relation Age of Onset   Depression Mother        Manic depression   Diabetes Mother        Type 2   Breast cancer Mother    Heart disease Mother    Colon polyps Father    Heart attack Father 53   Breast cancer Maternal Aunt    Breast cancer Paternal Aunt    Crohn's disease Brother    Osteoporosis Brother    Cancer Neg Hx    Colon cancer Neg Hx    Social History   Socioeconomic History   Marital status: Married    Spouse name: Not on file   Number of children: 2   Years of education: 13   Highest education level: Not on file  Occupational History   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 Lives with husband, Enjoys times with 5 grandkids, 1 great grandchild   Social Drivers of Corporate investment banker Strain: Low Risk  (02/08/2024)   Overall Financial Resource Strain (CARDIA)    Difficulty of Paying Living Expenses: Not hard at all  Food Insecurity: No Food Insecurity (02/08/2024)   Hunger Vital Sign    Worried About Running Out of Food in the Last Year: Never true    Ran Out of Food in the Last Year: Never true  Transportation Needs: No Transportation Needs (02/08/2024)   PRAPARE - Administrator, Civil Service (Medical): No    Lack of Transportation (Non-Medical): No  Physical Activity: Inactive (02/08/2024)   Exercise Vital Sign    Days of Exercise per Week: 0 days    Minutes of Exercise per Session: 0 min  Stress:  Stress Concern Present (02/08/2024)   [employer] of Occupational Health - Occupational Stress Questionnaire    Feeling of Stress : To some extent  Social Connections: Moderately Isolated (02/08/2024)   Social Connection and Isolation Panel [NHANES]    Frequency of Communication with Friends and Family: Three times a week    Frequency of Social Gatherings with Friends and Family: Never    Attends Religious Services: Never    Database administrator or Organizations: No    Attends Club or  Organization Meetings: Never    Marital Status: Married    Tobacco Counseling Counseling given: Not Answered Tobacco comments: Quit in 2001    Clinical Intake:  Pre-visit preparation completed: No  Pain : 0-10 Pain Score: 3  Pain Type: Chronic pain Pain Location: Ankle (rt wrist) Pain Orientation: Right Pain Descriptors / Indicators: Aching Pain Onset: More than a month ago Pain Frequency: Intermittent Pain Relieving Factors: Alieve;rest  Pain Relieving Factors: Alieve;rest  BMI - recorded: 26.13 Nutritional Status: BMI 25 -29 Overweight Nutritional Risks: None Diabetes: No  Lab Results  Component Value Date   HGBA1C 5.5 11/14/2020   HGBA1C 6.0 02/26/2017   HGBA1C 5.7 04/06/2004     How often do you need to have someone help you when you read instructions, pamphlets, or other written materials from your doctor or pharmacy?: 1 - Never  Interpreter Needed?: No  Comments: lives with husband and daughter who helps Information entered by :: B.Domitila Stetler,LPN   Activities of Daily Living      02/08/2024    8:26 AM  In your present state of health, do you have any difficulty performing the following activities:  Hearing? 0  Vision? 1  Difficulty concentrating or making decisions? 1  Walking or climbing stairs? 1  Dressing or bathing? 0  Doing errands, shopping? 1  Comment does not drive  Preparing Food and eating ? N  Using the Toilet? N  In the past six months, have you  accidently leaked urine? Y  Do you have problems with loss of bowel control? N  Managing your Medications? N  Managing your Finances? N  Housekeeping or managing your Housekeeping? N    Patient Care Team: Joaquim Nam, MD as PCP - General End, Cristal Deer, MD as PCP - Cardiology (Cardiology) Jaquita Folds, MD as Consulting Physician (Cardiology) Irene Limbo., MD as Referring Physician (Ophthalmology) Luciana Axe Alford Highland, MD as Consulting Physician (Ophthalmology) Kathyrn Sheriff, [facility] (Inactive) as Pharmacist (Pharmacist)  Indicate any recent Medical Services you may have received from other than [facility] providers in the past year (date may be approximate).     Assessment:   This is a routine wellness examination for Jaeline.  Hearing/Vision screen Hearing Screening - Comments:: Pt says her hearing is good Vision Screening - Comments:: Pt says her vision is not good for reading due to MCD;wears glasses Dr Alvester Morin Dr Fawn Kirk   Goals Addressed             This Visit's Progress    Follow up with Primary Care Provider   On track    02/08/24- I will continue to take medications as prescribed and to keep appointments with PCP as scheduled.      Patient Stated   On track    02/08/24-Would like to lose more weight     Track and Manage My Blood Pressure-Hypertension   On track    Timeframe:  Long-Range Goal Priority:  Medium Start Date:       01/11/22                      Expected End Date:   01/12/23                    Follow Up Date Sept 2023   - check blood pressure 3 times per week - choose a place to take my blood pressure (home, clinic or office, retail store) - write blood pressure results in a log or  diary    Why is this important?   You won't feel high blood pressure, but it can still hurt your blood vessels.  High blood pressure can cause heart or kidney problems. It can also cause a stroke.  Making lifestyle changes like losing a little weight or eating less salt  will help.  Checking your blood pressure at home and at different times of the day can help to control blood pressure.  If the doctor prescribes medicine remember to take it the way the doctor ordered.  Call the office if you cannot afford the medicine or if there are questions about it.     Notes:        Depression Screen     02/08/2024    8:18 AM 06/21/2023   10:12 AM 12/26/2022   11:13 AM 12/14/2022   11:40 AM 11/11/2021    1:25 PM 05/30/2021   11:31 AM 04/23/2019   11:45 AM  PHQ 2/9 Scores  PHQ - 2 Score 0 1 1 1 1  0 0  PHQ- 9 Score  4 3 5   0 0    Fall Risk     02/08/2024    8:14 AM 06/21/2023   10:12 AM 12/26/2022   11:10 AM 12/14/2022   11:40 AM 11/11/2021    1:23 PM  Fall Risk   Falls in the past year? 0 0 0 0 0  Number falls in past yr: 0 0 0 0 0  Injury with Fall? 0 0 0 0 0  Risk for fall due to : No Fall Risks No Fall Risks No Fall Risks No Fall Risks Orthopedic patient  Follow up Education provided;Falls prevention discussed Falls evaluation completed Education provided;Falls prevention discussed Falls evaluation completed Falls prevention discussed    MEDICARE RISK AT HOME:  Medicare Risk at Home Any stairs in or around the home?: No If so, are there any without handrails?: No Home free of loose throw rugs in walkways, pet beds, electrical cords, etc?: Yes Adequate lighting in your home to reduce risk of falls?: Yes Life alert?: No Use of a cane, walker or w/c?: Yes Grab bars in the bathroom?: Yes Shower chair or bench in shower?: Yes Elevated toilet seat or a handicapped toilet?: No  TIMED UP AND GO:  Was the test performed?  Yes  Length of time to ambulate 10 feet: 15 sec Gait slow and steady with assistive device  Cognitive Function: 6CIT completed    04/23/2019   12:00 PM 01/25/2018   11:04 AM 07/13/2016    9:45 AM  MMSE - Mini Mental State Exam  Orientation to time 5 5 5   Orientation to Place 5 5 5   Registration 3 3 3   Attention/ Calculation 0 0 0   Recall 3 2 3   Recall-comments  unable to recall 1 of 3 words   Language- name 2 objects 0 0 0  Language- repeat 1 1 1   Language- follow 3 step command 0 3 3  Language- read & follow direction 0 0 0  Write a sentence 0 0 0  Copy design 0 0 0  Total score 17 19 20         02/08/2024    8:28 AM 12/26/2022   11:28 AM  6CIT Screen  What Year? 0 points 0 points  What month? 0 points 0 points  What time? 0 points 0 points  Count back from 20 0 points 0 points  Months in reverse 0 points 0 points  Repeat phrase 2 points 0 points  Total Score 2 points 0 points    Immunizations Immunization History  Administered Date(s) Administered   Fluad Quad(high Dose 65+) 11/14/2021, 12/14/2022   Influenza Whole 08/12/2010   Influenza, Seasonal, Injecte, Preservative Fre 12/13/2012   Influenza,inj,Quad PF,6+ Mos 08/25/2013, 09/03/2014, 07/13/2016, 12/13/2017   Moderna Covid-19 Vaccine Bivalent Booster 63yrs & up 02/18/2022   PFIZER(Purple Top)SARS-COV-2 Vaccination 12/29/2019, 01/19/2020, 09/20/2020   Pneumococcal Conjugate-13 03/22/2015   Pneumococcal Polysaccharide-23 08/12/2010   Td 11/06/1998, 08/12/2010    Screening Tests Health Maintenance  Topic Date Due   Zoster Vaccines- Shingrix (1 of 2) Never done   DTaP/Tdap/Td (3 - Tdap) 08/12/2020   Colonoscopy  03/22/2021   COVID-19 Vaccine (5 - 2024-25 season) 11/20/2024 (Originally 07/08/2023)   INFLUENZA VACCINE  06/06/2024   Medicare Annual Wellness (AWV)  02/07/2025   Pneumonia Vaccine 31+ Years old  Completed   DEXA SCAN  Completed   Hepatitis C Screening  Completed   HPV VACCINES  Aged Out    Health Maintenance  Health Maintenance Due  Topic Date Due   Zoster Vaccines- Shingrix (1 of 2) Never done   DTaP/Tdap/Td (3 - Tdap) 08/12/2020   Colonoscopy  03/22/2021   Health Maintenance Items Addressed: None needed-pt indicates she will go to pharmacy for Shingrix  Additional Screening:  Vision Screening: Recommended annual  ophthalmology exams for early detection of glaucoma and other disorders of the eye.  Dental Screening: Recommended annual dental exams for proper oral hygiene  Community Resource Referral / Chronic Care Management: CRR required this visit?  No   CCM required this visit?  No     Plan:     I have personally reviewed and noted the following in the patient's chart:   Medical and social history Use of alcohol, tobacco or illicit drugs  Current medications and supplements including opioid prescriptions. Patient is not currently taking opioid prescriptions. Functional ability and status Nutritional status Physical activity Advanced directives List of other physicians Hospitalizations, surgeries, and ER visits in previous 12 months Vitals Screenings to include cognitive, depression, and falls Referrals and appointments  In addition, I have reviewed and discussed with patient certain preventive protocols, quality metrics, and best practice recommendations. A written personalized care plan for preventive services as well as general preventive health recommendations were provided to patient.     Sue Lush, LPN   06/09/6961   After Visit Summary: (In Person-Printed) AVS printed and given to the patient  Notes: Nothing significant to report at this time.

## 2024-02-08 NOTE — Patient Instructions (Signed)
 Susan [name] , Thank you for taking time to come for your Medicare Wellness Visit. I appreciate your ongoing commitment to your health goals. Please review the following plan we discussed and let me know if I can assist you in the future.   Referrals/Orders/Follow-Ups/Clinician Recommendations: none  This is a list of the screening recommended for you and due dates:  Health Maintenance  Topic Date Due   Zoster (Shingles) Vaccine (1 of 2) Never done   DTaP/Tdap/Td vaccine (3 - Tdap) 08/12/2020   Colon Cancer Screening  03/22/2021   COVID-19 Vaccine (5 - 2024-25 season) 11/20/2024*   Flu Shot  06/06/2024   Medicare Annual Wellness Visit  02/07/2025   Pneumonia Vaccine  Completed   DEXA scan (bone density measurement)  Completed   Hepatitis C Screening  Completed   HPV Vaccine  Aged Out  *Topic was postponed. The date shown is not the original due date.    Advanced directives: (Copy Requested) Please bring a copy of your health care power of attorney and living will to the office to be added to your chart at your convenience. You can mail to [facility] [address] [place], [place] [address] or email to [email or web address]  Next Medicare Annual Wellness Visit scheduled for next year: Yes 02/10/25 @ 10:10am in person

## 2024-02-11 ENCOUNTER — Ambulatory Visit (INDEPENDENT_AMBULATORY_CARE_PROVIDER_SITE_OTHER): Admitting: Family Medicine

## 2024-02-11 ENCOUNTER — Ambulatory Visit (INDEPENDENT_AMBULATORY_CARE_PROVIDER_SITE_OTHER)
Admission: RE | Admit: 2024-02-11 | Discharge: 2024-02-11 | Disposition: A | Discharge status: Home / Self Care | Source: Ambulatory Visit | Attending: Family Medicine | Admitting: Family Medicine

## 2024-02-11 ENCOUNTER — Encounter: Payer: Self-pay | Admitting: Family Medicine

## 2024-02-11 VITALS — BP 126/74 | HR 77 | Temp 98.7°F | Ht 59.84 in | Wt 136.0 lb

## 2024-02-11 DIAGNOSIS — G5600 Carpal tunnel syndrome, unspecified upper limb: Secondary | ICD-10-CM

## 2024-02-11 DIAGNOSIS — I10 Essential (primary) hypertension: Secondary | ICD-10-CM

## 2024-02-11 DIAGNOSIS — G5601 Carpal tunnel syndrome, right upper limb: Secondary | ICD-10-CM | POA: Diagnosis not present

## 2024-02-11 DIAGNOSIS — Z7189 Other specified counseling: Secondary | ICD-10-CM

## 2024-02-11 DIAGNOSIS — Z1211 Encounter for screening for malignant neoplasm of colon: Secondary | ICD-10-CM

## 2024-02-11 DIAGNOSIS — M25571 Pain in right ankle and joints of right foot: Secondary | ICD-10-CM | POA: Diagnosis not present

## 2024-02-11 DIAGNOSIS — M7731 Calcaneal spur, right foot: Secondary | ICD-10-CM | POA: Diagnosis not present

## 2024-02-11 DIAGNOSIS — M25579 Pain in unspecified ankle and joints of unspecified foot: Secondary | ICD-10-CM

## 2024-02-11 DIAGNOSIS — M810 Age-related osteoporosis without current pathological fracture: Secondary | ICD-10-CM | POA: Diagnosis not present

## 2024-02-11 DIAGNOSIS — M7661 Achilles tendinitis, right leg: Secondary | ICD-10-CM | POA: Diagnosis not present

## 2024-02-11 DIAGNOSIS — Z4789 Encounter for other orthopedic aftercare: Secondary | ICD-10-CM | POA: Diagnosis not present

## 2024-02-11 DIAGNOSIS — E785 Hyperlipidemia, unspecified: Secondary | ICD-10-CM | POA: Diagnosis not present

## 2024-02-11 DIAGNOSIS — Z0000 Encounter for general adult medical examination without abnormal findings: Secondary | ICD-10-CM

## 2024-02-11 MED ORDER — VITAMIN D3 50 MCG (2000 UT) PO CAPS: 2000.0000 [IU] | ORAL_CAPSULE | ORAL | Status: AC

## 2024-02-11 NOTE — Patient Instructions (Addendum)
 Check with your insurance to see if they will cover the shingles shot. Tdap and shingles may be cheaper at the pharmacy.  Ask the pharmacy about the RSV vaccine.  Take care.  Glad to see you.   You should get a call about seeing ortho and GI.   Xray on the way out.

## 2024-02-11 NOTE — Progress Notes (Unsigned)
 Ankle pain, right.  H/o fracture.  More pain with walking.  Anterior ankle pain.   She has R carpal tunnel sx.  Pain, can radiate up the arm.  Can have local paresthesia.  Still having pain in spite of bracing.  Longstanding R 2nd finger changes from injury 50+ years ago.    Osteoporosis.  Still taking vitamin D. Less back pain when skipping a dose of vit D.  Taking prolia at baseline.  DXA 2024.    Elevated Cholesterol: Using medications without problems:yes Muscle aches: no Diet compliance: d/w pt.  Exercise: limited by ankle pain.   Hypertension:    Using medication without problems or lightheadedness: yes Chest pain with exertion:no Edema:no Short of breath: only with sig exertion and this is stable.  Labs d/w pt.    Vaccines d/w pt.   Tdap d/w pt.   Colonoscopy 2017- d/w pt.  Referral placed 2025.   Breast cancer screening- done 2024 DXA 2024.   Advance directive- oldest daughter Selena Batten designated if patient were incapacitated.    Meds, vitals, and allergies reviewed.   ROS: Per HPI unless specifically indicated in ROS section   GEN: nad, alert and oriented HEENT: ncat NECK: supple w/o LA CV: rrr PULM: ctab, no inc wob ABD: soft, +bs EXT: no edema SKIN: no acute rash Longstanding lesion R 2nd finger.  Old scar medial R ankle. Med and lat mal not ttp Midfoot not ttp. Normal DP pulse.  Achilles not ttp.   30 minutes were devoted to patient care in this encounter (this includes time spent reviewing the patient's file/history, interviewing and examining the patient, counseling/reviewing plan with patient).

## 2024-02-12 DIAGNOSIS — H3562 Retinal hemorrhage, left eye: Secondary | ICD-10-CM | POA: Diagnosis not present

## 2024-02-12 DIAGNOSIS — H43813 Vitreous degeneration, bilateral: Secondary | ICD-10-CM | POA: Diagnosis not present

## 2024-02-12 DIAGNOSIS — H353114 Nonexudative age-related macular degeneration, right eye, advanced atrophic with subfoveal involvement: Secondary | ICD-10-CM | POA: Diagnosis not present

## 2024-02-12 DIAGNOSIS — H353221 Exudative age-related macular degeneration, left eye, with active choroidal neovascularization: Secondary | ICD-10-CM | POA: Diagnosis not present

## 2024-02-12 DIAGNOSIS — H353123 Nonexudative age-related macular degeneration, left eye, advanced atrophic without subfoveal involvement: Secondary | ICD-10-CM | POA: Diagnosis not present

## 2024-02-13 ENCOUNTER — Telehealth: Payer: Self-pay | Admitting: Family Medicine

## 2024-02-13 DIAGNOSIS — M25579 Pain in unspecified ankle and joints of unspecified foot: Secondary | ICD-10-CM | POA: Insufficient documentation

## 2024-02-13 NOTE — Assessment & Plan Note (Signed)
 Vaccines d/w pt.   Tdap d/w pt.   Colonoscopy 2017- d/w pt.  Referral placed 2025.   Breast cancer screening- done 2024 DXA 2024.   Advance directive- oldest daughter Selena Batten designated if patient were incapacitated.

## 2024-02-13 NOTE — Assessment & Plan Note (Signed)
 Discussed options.  Refer to hand surgery clinic.

## 2024-02-13 NOTE — Assessment & Plan Note (Signed)
 Continue work on diet.  Continue Zetia.  Statin intolerant.  Labs discussed with patient.

## 2024-02-13 NOTE — Assessment & Plan Note (Signed)
 Would continue Prolia.  Discussed taking vitamin D every other day to see if she can tolerate that more easily.

## 2024-02-13 NOTE — Assessment & Plan Note (Signed)
Advance directive- oldest daughter Maudie Mercury designated if patient were incapacitated.

## 2024-02-13 NOTE — Telephone Encounter (Signed)
 Please update patient.  I am awaiting overread on her x-ray.  She has old degenerative changes.  She has spurring on the heel.  Her hardware appears intact.  I do not see a fracture.  I think if she keeps having pain we should set her up with orthopedics.  Please get her thoughts about that and let me know.

## 2024-02-13 NOTE — Assessment & Plan Note (Signed)
Reasonable to check plain films.  See notes on imaging.

## 2024-02-13 NOTE — Assessment & Plan Note (Signed)
 Continue lisinopril/hydrochlorothiazide.

## 2024-02-14 NOTE — Telephone Encounter (Signed)
 Noted. Thanks.

## 2024-02-14 NOTE — Telephone Encounter (Signed)
 Returned call to patient and reviewed Dr. Armanda Heritage notes. Patient states at this time she is going to hold off on the referral to ortho and try using the cane how she was explained to use. She will reach out in about a month or two if she changes her mind

## 2024-02-18 ENCOUNTER — Encounter: Payer: Self-pay | Admitting: Family Medicine

## 2024-02-21 DIAGNOSIS — H353221 Exudative age-related macular degeneration, left eye, with active choroidal neovascularization: Secondary | ICD-10-CM | POA: Diagnosis not present

## 2024-02-21 DIAGNOSIS — H353114 Nonexudative age-related macular degeneration, right eye, advanced atrophic with subfoveal involvement: Secondary | ICD-10-CM | POA: Diagnosis not present

## 2024-02-21 DIAGNOSIS — H02833 Dermatochalasis of right eye, unspecified eyelid: Secondary | ICD-10-CM | POA: Diagnosis not present

## 2024-02-21 DIAGNOSIS — H43813 Vitreous degeneration, bilateral: Secondary | ICD-10-CM | POA: Diagnosis not present

## 2024-02-21 DIAGNOSIS — H02836 Dermatochalasis of left eye, unspecified eyelid: Secondary | ICD-10-CM | POA: Diagnosis not present

## 2024-02-21 DIAGNOSIS — H353123 Nonexudative age-related macular degeneration, left eye, advanced atrophic without subfoveal involvement: Secondary | ICD-10-CM | POA: Diagnosis not present

## 2024-02-21 DIAGNOSIS — H3562 Retinal hemorrhage, left eye: Secondary | ICD-10-CM | POA: Diagnosis not present

## 2024-02-27 ENCOUNTER — Other Ambulatory Visit (HOSPITAL_COMMUNITY): Payer: Self-pay | Admitting: Orthopedic Surgery

## 2024-02-27 DIAGNOSIS — G5601 Carpal tunnel syndrome, right upper limb: Secondary | ICD-10-CM | POA: Diagnosis not present

## 2024-02-27 DIAGNOSIS — G5603 Carpal tunnel syndrome, bilateral upper limbs: Secondary | ICD-10-CM | POA: Diagnosis not present

## 2024-02-27 DIAGNOSIS — M79641 Pain in right hand: Secondary | ICD-10-CM | POA: Diagnosis not present

## 2024-02-27 DIAGNOSIS — M79644 Pain in right finger(s): Secondary | ICD-10-CM | POA: Diagnosis not present

## 2024-02-29 ENCOUNTER — Encounter: Payer: Self-pay | Admitting: Internal Medicine

## 2024-02-29 ENCOUNTER — Ambulatory Visit: Payer: Medicare HMO | Attending: Internal Medicine | Admitting: Internal Medicine

## 2024-02-29 VITALS — BP 134/70 | HR 67 | Resp 16 | Ht 59.0 in | Wt 135.0 lb

## 2024-02-29 DIAGNOSIS — I2510 Atherosclerotic heart disease of native coronary artery without angina pectoris: Secondary | ICD-10-CM

## 2024-02-29 DIAGNOSIS — I38 Endocarditis, valve unspecified: Secondary | ICD-10-CM | POA: Diagnosis not present

## 2024-02-29 DIAGNOSIS — I351 Nonrheumatic aortic (valve) insufficiency: Secondary | ICD-10-CM

## 2024-02-29 DIAGNOSIS — I10 Essential (primary) hypertension: Secondary | ICD-10-CM

## 2024-02-29 NOTE — Patient Instructions (Signed)
 Medication Instructions:  Your physician recommends that you continue on your current medications as directed. Please refer to the Current Medication list given to you today.   *If you need a refill on your cardiac medications before your next appointment, please call your pharmacy*  Lab Work: No labs ordered today   Testing/Procedures: Your physician has requested that you have an echocardiogram in 1 year. Echocardiography is a painless test that uses sound waves to create images of your heart. It provides your doctor with information about the size and shape of your heart and how well your heart's chambers and valves are working.   You may receive an ultrasound enhancing agent through an IV if needed to better visualize your heart during the echo. This procedure takes approximately one hour.  There are no restrictions for this procedure.  This will take place at [address] [facility] [address], [place] [address]  Please note: We ask at that you not bring children with you during ultrasound (echo/ vascular) testing. Due to room size and safety concerns, children are not allowed in the ultrasound rooms during exams. Our front office staff cannot provide observation of children in our lobby area while testing is being conducted. An adult accompanying a patient to their appointment will only be allowed in the ultrasound room at the discretion of the ultrasound technician under special circumstances. We apologize for any inconvenience.   Follow-Up: At [facility], you and your health needs are our priority.  As part of our continuing mission to provide you with exceptional heart care, our providers are all part of one team.  This team includes your primary Cardiologist (physician) and Advanced Practice Providers or APPs (Physician Assistants and Nurse Practitioners) who all work together to provide you with the care you need, when you need it.  Your next appointment:   1  year(s)  Provider:   You may see Sammy Crisp, MD or one of the following Advanced Practice Providers on your designated Care Team:   Laneta Pintos, NP Gildardo Labrador, PA-C Varney Gentleman, PA-C Cadence Rich Hill, PA-C Ronald Cockayne, NP Morey Ar, NP    We recommend signing up for the patient portal called "MyChart".  Sign up information is provided on this After Visit Summary.  MyChart is used to connect with patients for Virtual Visits (Telemedicine).  Patients are able to view lab/test results, encounter notes, upcoming appointments, etc.  Non-urgent messages can be sent to your provider as well.   To learn more about what you can do with MyChart, go to [email or web address].

## 2024-02-29 NOTE — Progress Notes (Signed)
 Cardiology Office Note:  .   Date:  02/29/2024  ID:  Fermina, Mishkin 1946-01-12, MRN [id number] PCP: Donnie Galea, MD  [facility] Providers Cardiologist:  Sammy Crisp, MD     History of Present Illness: .   Susan [name] is a 78 y.o. female with history of aortic, mitral, tricuspid valve regurgitation, nonobstructive coronary artery disease, hypertension, hyperlipidemia, thyroid  disease, macular degeneration, NASH, and IBS, who presents for follow-up of valvular heart disease.  I last saw her in 10/2022, at which time she was feeling well other than numbness in the right arm.  Chronic exertional dyspnea was stable.  We did not make any medication changes or pursue additional testing.  Today, Ms. Bottari reports that she has been feeling fairly well though she continues to struggle with right carpal tunnel syndrome and is wearing a brace today.  She is following with hand surgery in [place].  Chronic exertional dyspnea when walking or doing housework is unchanged from her last visit.  She denies chest pain and palpitations.  She reports very rare dizziness but no syncope or falls.  She has mild swelling of the right foot related to a remote ankle injury.  She is trying to walk some as the weather allows.  She notes that her blood pressure at home is typically around 118-120 systolic.  ROS: See HPI  Studies Reviewed: Aaron Aas   EKG Interpretation Date/Time:  Friday February 29 2024 08:25:34 EDT Ventricular Rate:  68 PR Interval:  112 QRS Duration:  108 QT Interval:  400 QTC Calculation: 425 R Axis:   27  Text Interpretation: Normal sinus rhythm with sinus arrhythmia and short PR interval Incomplete right bundle branch block When compared with ECG of 06-Oct-2022 No significant change was found Confirmed by Regnald Bowens, Veryl Gottron [phone]) on 02/29/2024 8:28:25 AM    Pharmacologic MPI (02/02/2022): Low risk study without evidence of ischemia or scar.  LVEF greater than 65%.   Coronary artery calcification and aortic atherosclerosis noted.  TTE (12/23/2021): Normal LV size and wall thickness.  LVEF 60-65% with grade 1 diastolic dysfunction.  Normal RV size and function.  Mild pulmonary hypertension (RVSP 38 mmHg).  Normal biatrial size.  Mild-moderate aortic regurgitation without stenosis.  Normal CVP.  TTE (06/08/2020): Normal LV size and function (EF 60-65%) with grade 1 diastolic dysfunction.  Mild RVH with normal size and contraction.  Mild pulmonary hypertension.  Trivial mitral regurgitation.  Moderate to severe tricuspid regurgitation.  Mild to moderate aortic regurgitation.  Normal CVP.  TTE (10/16/2018, Dr. Meredeth Stallion): Normal LV size with mild LVH.  LVEF 70-75%.  Mild to moderate mitral and moderate to severe tricuspid regurgitation.  Moderate aortic regurgitation. Mild to moderate pulmonary hypertension.  RV size and function.    Trivial pericardial effusion.  Carotid Doppler (10/16/2018, Dr. Meredeth Stallion): Protruding plaque in both carotid bulbs without obstruction.  Increased in right mid ICA felt to be due to tortuosity.  Cardiac CTA (09/14/15, Dr. Meredeth Stallion): CAC score 74.  Mild proximal LAD disease with haziness.  LCx and RCA are normal.  Risk Assessment/Calculations:             Physical Exam:   VS:  BP 134/70 (BP Location: Left Arm, Patient Position: Sitting, Cuff Size: Normal)   Pulse 67   Resp 16   Ht 4\' 11"  (1.499 m)   Wt 135 lb (61.2 kg)   SpO2 96%   BMI 27.27 kg/m    Wt Readings from Last 3 Encounters:  02/29/24  135 lb (61.2 kg)  02/11/24 136 lb (61.7 kg)  02/08/24 133 lb 12.8 oz (60.7 kg)    General:  NAD. Neck: No JVD or HJR. Lungs: Clear to auscultation bilaterally without wheezes or crackles. Heart: Regular rate and rhythm with 1/6 systolic and diastolic murmurs.  No rubs or gallops. Abdomen: Soft, nontender, nondistended. Extremities: No lower extremity edema.  ASSESSMENT AND PLAN: .    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  Chronic exertional dyspnea with modest activity is unchanged.  She appears euvolemic on exam.  We discussed potential association between carpal tunnel syndrome and valvular heart disease in the setting of amyloidosis.  However, her EKG does not show low voltage nor is her carpal tunnel syndrome bilateral.  She also does not have significant heart failure symptoms.  We have therefore agreed to defer laboratory and imaging workup for amyloidosis at this time.  We will plan to repeat an echocardiogram shortly before her follow-up visit in 1 year.  Coronary artery disease: No angina reported in the setting of mild proximal LAD disease noted on prior coronary CTA and subsequent MPI in 01/2022 showing no evidence of ischemia or scar.  Continue aspirin  and ezetimibe  and lieu of statin given intolerance of atorvastatin  in the past and reasonable LDL of 79 on last check in February.  Hypertension: Blood pressure borderline elevated today but typically better at home.  Continue current regimen of lisinopril -HCTZ.    Dispo: Return to clinic in 1 year with echocardiogram shortly before that visit to reevaluate mixed valvular heart disease.  Signed, Sammy Crisp, MD

## 2024-03-01 ENCOUNTER — Encounter: Payer: Self-pay | Admitting: Internal Medicine

## 2024-03-04 DIAGNOSIS — G5601 Carpal tunnel syndrome, right upper limb: Secondary | ICD-10-CM | POA: Diagnosis not present

## 2024-03-10 ENCOUNTER — Ambulatory Visit
Admission: RE | Admit: 2024-03-10 | Discharge: 2024-03-10 | Disposition: A | Discharge status: Home / Self Care | Source: Ambulatory Visit | Attending: Orthopedic Surgery | Admitting: Orthopedic Surgery

## 2024-03-10 DIAGNOSIS — M79641 Pain in right hand: Secondary | ICD-10-CM | POA: Diagnosis not present

## 2024-03-10 DIAGNOSIS — M1811 Unilateral primary osteoarthritis of first carpometacarpal joint, right hand: Secondary | ICD-10-CM | POA: Diagnosis not present

## 2024-03-11 ENCOUNTER — Telehealth: Payer: Self-pay

## 2024-03-11 NOTE — Telephone Encounter (Signed)
 Copied from CRM [id number]. Topic: General - Other >> Mar 10, 2024 12:44 PM Kita Perish H wrote: Reason for CRM: Patient would like to inform Dr. Vallarie Gauze that she had her Tdap and RSV vaccinations done today at [facility] to put in her records and promises she will go get shingles vaccination in the next week or two.

## 2024-03-27 ENCOUNTER — Telehealth: Payer: Self-pay | Admitting: *Deleted

## 2024-03-27 DIAGNOSIS — M79641 Pain in right hand: Secondary | ICD-10-CM | POA: Diagnosis not present

## 2024-03-27 DIAGNOSIS — G5601 Carpal tunnel syndrome, right upper limb: Secondary | ICD-10-CM | POA: Diagnosis not present

## 2024-03-27 DIAGNOSIS — R2231 Localized swelling, mass and lump, right upper limb: Secondary | ICD-10-CM | POA: Diagnosis not present

## 2024-03-27 NOTE — Telephone Encounter (Signed)
   Patient Name: Susan [name]  DOB: [birth year] MRN: [id number]  Primary Cardiologist: Sammy Crisp, MD  Chart reviewed as part of pre-operative protocol coverage. Given past medical history and time since last visit, based on ACC/AHA guidelines, Keilin T Lover is at acceptable risk for the planned procedure without further cardiovascular testing.   The patient was advised that if she develops new symptoms prior to surgery to contact our office to arrange for a follow-up visit, and she verbalized understanding.  Ms. Geck reported stable exertional dyspnea and no angina at our visit on 02/29/2024.  She is able to complete 4 METS of activity without difficulty.  Given the low risk nature of hand surgery, she can proceed without further cardiac testing or intervention.  Aspirin  can be held for 5 to 7 days beforehand and resumed when felt safe to do so by Dr. Delmar Ferrara.   I will route this recommendation to the requesting party via Epic fax function and remove from pre-op pool.  Please call with questions.  Francene Ing, Retha Cast, NP 03/27/2024, 3:43 PM

## 2024-03-27 NOTE — Telephone Encounter (Signed)
   Pre-operative Risk Assessment    Patient Name: Susan [name]  DOB: May 12, 1946 MRN: [id number]   Date of last office visit: 02/29/24 DR. END Date of next office visit: NONE   Request for Surgical Clearance    Procedure:  RIGHT CARPAL TUNNEL SYNDROME, RING FINGER FINGER MASS, ULNAR DIGITAL NERVE ADHESIONS AND PARESTHESIAS   Date of Surgery:  Clearance TBD                                Surgeon:  DR. Ltanya Rummer Surgeon's Group or Practice Name:  [facility] Phone number:  [phone] MEGAN DAVIS Fax number:  [phone]   Type of Clearance Requested:   - Medical  - Pharmacy:  Hold Aspirin      Type of Anesthesia:  Local  WITH IV SEDATION   Additional requests/questions:    Princeton Broom   03/27/2024, 1:45 PM

## 2024-03-27 NOTE — Telephone Encounter (Signed)
 Good Afternoon Dr. Nolan Battle  We have received a surgical clearance request for Susan [name] for upcoming right carpal tunnel procedure . They were seen recently in clinic on 02/29/2024. Can you please comment on surgical clearance for upcoming carpal tunnel procedure. Please forward you guidance and recommendations to [facility] PREOP   Thank you, Charles Connor, NP

## 2024-03-27 NOTE — Telephone Encounter (Signed)
 Ms. Hackmann reported stable exertional dyspnea and no angina at our visit on 02/29/2024.  She is able to complete 4 METS of activity without difficulty.  Given the low risk nature of hand surgery, she can proceed without further cardiac testing or intervention.  Aspirin  can be held for 5 to 7 days beforehand and resumed when felt safe to do so by Dr. Delmar Ferrara.  Sammy Crisp, MD [facility]

## 2024-04-01 DIAGNOSIS — H02833 Dermatochalasis of right eye, unspecified eyelid: Secondary | ICD-10-CM | POA: Diagnosis not present

## 2024-04-01 DIAGNOSIS — H02836 Dermatochalasis of left eye, unspecified eyelid: Secondary | ICD-10-CM | POA: Diagnosis not present

## 2024-04-01 DIAGNOSIS — H353114 Nonexudative age-related macular degeneration, right eye, advanced atrophic with subfoveal involvement: Secondary | ICD-10-CM | POA: Diagnosis not present

## 2024-04-01 DIAGNOSIS — H43813 Vitreous degeneration, bilateral: Secondary | ICD-10-CM | POA: Diagnosis not present

## 2024-04-01 DIAGNOSIS — H353123 Nonexudative age-related macular degeneration, left eye, advanced atrophic without subfoveal involvement: Secondary | ICD-10-CM | POA: Diagnosis not present

## 2024-04-01 DIAGNOSIS — H3562 Retinal hemorrhage, left eye: Secondary | ICD-10-CM | POA: Diagnosis not present

## 2024-04-01 DIAGNOSIS — H353221 Exudative age-related macular degeneration, left eye, with active choroidal neovascularization: Secondary | ICD-10-CM | POA: Diagnosis not present

## 2024-04-08 ENCOUNTER — Other Ambulatory Visit: Payer: Self-pay | Admitting: Orthopedic Surgery

## 2024-04-08 DIAGNOSIS — M7989 Other specified soft tissue disorders: Secondary | ICD-10-CM | POA: Diagnosis not present

## 2024-04-08 DIAGNOSIS — G5601 Carpal tunnel syndrome, right upper limb: Secondary | ICD-10-CM | POA: Diagnosis not present

## 2024-04-08 DIAGNOSIS — R2231 Localized swelling, mass and lump, right upper limb: Secondary | ICD-10-CM | POA: Diagnosis not present

## 2024-04-08 DIAGNOSIS — G5621 Lesion of ulnar nerve, right upper limb: Secondary | ICD-10-CM | POA: Diagnosis not present

## 2024-04-08 DIAGNOSIS — D2111 Benign neoplasm of connective and other soft tissue of right upper limb, including shoulder: Secondary | ICD-10-CM | POA: Diagnosis not present

## 2024-04-10 LAB — SURGICAL PATHOLOGY

## 2024-04-17 ENCOUNTER — Encounter: Payer: Self-pay | Admitting: Family Medicine

## 2024-04-17 DIAGNOSIS — H02833 Dermatochalasis of right eye, unspecified eyelid: Secondary | ICD-10-CM | POA: Diagnosis not present

## 2024-04-17 DIAGNOSIS — H353114 Nonexudative age-related macular degeneration, right eye, advanced atrophic with subfoveal involvement: Secondary | ICD-10-CM | POA: Diagnosis not present

## 2024-04-17 DIAGNOSIS — H353221 Exudative age-related macular degeneration, left eye, with active choroidal neovascularization: Secondary | ICD-10-CM | POA: Diagnosis not present

## 2024-04-17 DIAGNOSIS — H02836 Dermatochalasis of left eye, unspecified eyelid: Secondary | ICD-10-CM | POA: Diagnosis not present

## 2024-04-17 DIAGNOSIS — H353123 Nonexudative age-related macular degeneration, left eye, advanced atrophic without subfoveal involvement: Secondary | ICD-10-CM | POA: Diagnosis not present

## 2024-04-17 DIAGNOSIS — H43813 Vitreous degeneration, bilateral: Secondary | ICD-10-CM | POA: Diagnosis not present

## 2024-04-17 DIAGNOSIS — H3562 Retinal hemorrhage, left eye: Secondary | ICD-10-CM | POA: Diagnosis not present

## 2024-05-13 DIAGNOSIS — H3562 Retinal hemorrhage, left eye: Secondary | ICD-10-CM | POA: Diagnosis not present

## 2024-05-13 DIAGNOSIS — H02833 Dermatochalasis of right eye, unspecified eyelid: Secondary | ICD-10-CM | POA: Diagnosis not present

## 2024-05-13 DIAGNOSIS — H43813 Vitreous degeneration, bilateral: Secondary | ICD-10-CM | POA: Diagnosis not present

## 2024-05-13 DIAGNOSIS — H02836 Dermatochalasis of left eye, unspecified eyelid: Secondary | ICD-10-CM | POA: Diagnosis not present

## 2024-05-13 DIAGNOSIS — H353114 Nonexudative age-related macular degeneration, right eye, advanced atrophic with subfoveal involvement: Secondary | ICD-10-CM | POA: Diagnosis not present

## 2024-05-13 DIAGNOSIS — H353123 Nonexudative age-related macular degeneration, left eye, advanced atrophic without subfoveal involvement: Secondary | ICD-10-CM | POA: Diagnosis not present

## 2024-05-13 DIAGNOSIS — H353221 Exudative age-related macular degeneration, left eye, with active choroidal neovascularization: Secondary | ICD-10-CM | POA: Diagnosis not present

## 2024-05-23 ENCOUNTER — Telehealth: Payer: Self-pay

## 2024-05-23 NOTE — Telephone Encounter (Signed)
 Attempted to call pt, no answer.  Left detailed message that Prolia  is given every 6 months and her last injection was 01/15/24.  Forwarding message to Prolia  pool for follow up.

## 2024-05-23 NOTE — Telephone Encounter (Signed)
 Copied from CRM [id number]. Topic: Clinical - Medication Question >> May 23, 2024 12:46 PM Mia F wrote: Reason for CRM: Pt is calling to see when she is due for her next denosumab  (PROLIA ) injection 60 mg. She does not have an appt scheduled and the medication does not show how often pt is due to take the shot. Per [facility] this appt must be scheduled by office

## 2024-06-12 DIAGNOSIS — H02833 Dermatochalasis of right eye, unspecified eyelid: Secondary | ICD-10-CM | POA: Diagnosis not present

## 2024-06-12 DIAGNOSIS — H43813 Vitreous degeneration, bilateral: Secondary | ICD-10-CM | POA: Diagnosis not present

## 2024-06-12 DIAGNOSIS — H02836 Dermatochalasis of left eye, unspecified eyelid: Secondary | ICD-10-CM | POA: Diagnosis not present

## 2024-06-12 DIAGNOSIS — H353123 Nonexudative age-related macular degeneration, left eye, advanced atrophic without subfoveal involvement: Secondary | ICD-10-CM | POA: Diagnosis not present

## 2024-06-12 DIAGNOSIS — H353221 Exudative age-related macular degeneration, left eye, with active choroidal neovascularization: Secondary | ICD-10-CM | POA: Diagnosis not present

## 2024-06-12 DIAGNOSIS — H3562 Retinal hemorrhage, left eye: Secondary | ICD-10-CM | POA: Diagnosis not present

## 2024-06-12 DIAGNOSIS — H353114 Nonexudative age-related macular degeneration, right eye, advanced atrophic with subfoveal involvement: Secondary | ICD-10-CM | POA: Diagnosis not present

## 2024-06-13 DIAGNOSIS — M9903 Segmental and somatic dysfunction of lumbar region: Secondary | ICD-10-CM | POA: Diagnosis not present

## 2024-06-13 DIAGNOSIS — M9905 Segmental and somatic dysfunction of pelvic region: Secondary | ICD-10-CM | POA: Diagnosis not present

## 2024-06-13 DIAGNOSIS — M41125 Adolescent idiopathic scoliosis, thoracolumbar region: Secondary | ICD-10-CM | POA: Diagnosis not present

## 2024-06-13 DIAGNOSIS — M955 Acquired deformity of pelvis: Secondary | ICD-10-CM | POA: Diagnosis not present

## 2024-06-16 ENCOUNTER — Encounter: Payer: Self-pay | Admitting: Family Medicine

## 2024-06-16 DIAGNOSIS — M9905 Segmental and somatic dysfunction of pelvic region: Secondary | ICD-10-CM | POA: Diagnosis not present

## 2024-06-16 DIAGNOSIS — M955 Acquired deformity of pelvis: Secondary | ICD-10-CM | POA: Diagnosis not present

## 2024-06-16 DIAGNOSIS — M41125 Adolescent idiopathic scoliosis, thoracolumbar region: Secondary | ICD-10-CM | POA: Diagnosis not present

## 2024-06-16 DIAGNOSIS — Z1231 Encounter for screening mammogram for malignant neoplasm of breast: Secondary | ICD-10-CM | POA: Diagnosis not present

## 2024-06-16 DIAGNOSIS — M9903 Segmental and somatic dysfunction of lumbar region: Secondary | ICD-10-CM | POA: Diagnosis not present

## 2024-06-16 LAB — HM MAMMOGRAPHY

## 2024-06-18 ENCOUNTER — Ambulatory Visit: Payer: Self-pay | Admitting: Family Medicine

## 2024-06-18 DIAGNOSIS — M955 Acquired deformity of pelvis: Secondary | ICD-10-CM | POA: Diagnosis not present

## 2024-06-18 DIAGNOSIS — M9905 Segmental and somatic dysfunction of pelvic region: Secondary | ICD-10-CM | POA: Diagnosis not present

## 2024-06-18 DIAGNOSIS — M9903 Segmental and somatic dysfunction of lumbar region: Secondary | ICD-10-CM | POA: Diagnosis not present

## 2024-06-18 DIAGNOSIS — M41125 Adolescent idiopathic scoliosis, thoracolumbar region: Secondary | ICD-10-CM | POA: Diagnosis not present

## 2024-06-24 DIAGNOSIS — H02833 Dermatochalasis of right eye, unspecified eyelid: Secondary | ICD-10-CM | POA: Diagnosis not present

## 2024-06-24 DIAGNOSIS — H02836 Dermatochalasis of left eye, unspecified eyelid: Secondary | ICD-10-CM | POA: Diagnosis not present

## 2024-06-24 DIAGNOSIS — H353123 Nonexudative age-related macular degeneration, left eye, advanced atrophic without subfoveal involvement: Secondary | ICD-10-CM | POA: Diagnosis not present

## 2024-06-24 DIAGNOSIS — H43813 Vitreous degeneration, bilateral: Secondary | ICD-10-CM | POA: Diagnosis not present

## 2024-06-24 DIAGNOSIS — H353221 Exudative age-related macular degeneration, left eye, with active choroidal neovascularization: Secondary | ICD-10-CM | POA: Diagnosis not present

## 2024-06-24 DIAGNOSIS — H3562 Retinal hemorrhage, left eye: Secondary | ICD-10-CM | POA: Diagnosis not present

## 2024-06-24 DIAGNOSIS — H353114 Nonexudative age-related macular degeneration, right eye, advanced atrophic with subfoveal involvement: Secondary | ICD-10-CM | POA: Diagnosis not present

## 2024-06-25 DIAGNOSIS — M9903 Segmental and somatic dysfunction of lumbar region: Secondary | ICD-10-CM | POA: Diagnosis not present

## 2024-06-25 DIAGNOSIS — M955 Acquired deformity of pelvis: Secondary | ICD-10-CM | POA: Diagnosis not present

## 2024-06-25 DIAGNOSIS — M41125 Adolescent idiopathic scoliosis, thoracolumbar region: Secondary | ICD-10-CM | POA: Diagnosis not present

## 2024-06-25 DIAGNOSIS — M9905 Segmental and somatic dysfunction of pelvic region: Secondary | ICD-10-CM | POA: Diagnosis not present

## 2024-07-02 DIAGNOSIS — M955 Acquired deformity of pelvis: Secondary | ICD-10-CM | POA: Diagnosis not present

## 2024-07-02 DIAGNOSIS — M41125 Adolescent idiopathic scoliosis, thoracolumbar region: Secondary | ICD-10-CM | POA: Diagnosis not present

## 2024-07-02 DIAGNOSIS — M9903 Segmental and somatic dysfunction of lumbar region: Secondary | ICD-10-CM | POA: Diagnosis not present

## 2024-07-02 DIAGNOSIS — M9905 Segmental and somatic dysfunction of pelvic region: Secondary | ICD-10-CM | POA: Diagnosis not present

## 2024-07-04 ENCOUNTER — Encounter: Payer: Self-pay | Admitting: Physician Assistant

## 2024-07-09 DIAGNOSIS — M9905 Segmental and somatic dysfunction of pelvic region: Secondary | ICD-10-CM | POA: Diagnosis not present

## 2024-07-09 DIAGNOSIS — M955 Acquired deformity of pelvis: Secondary | ICD-10-CM | POA: Diagnosis not present

## 2024-07-09 DIAGNOSIS — M41125 Adolescent idiopathic scoliosis, thoracolumbar region: Secondary | ICD-10-CM | POA: Diagnosis not present

## 2024-07-09 DIAGNOSIS — M9903 Segmental and somatic dysfunction of lumbar region: Secondary | ICD-10-CM | POA: Diagnosis not present

## 2024-07-14 DIAGNOSIS — M41125 Adolescent idiopathic scoliosis, thoracolumbar region: Secondary | ICD-10-CM | POA: Diagnosis not present

## 2024-07-14 DIAGNOSIS — M955 Acquired deformity of pelvis: Secondary | ICD-10-CM | POA: Diagnosis not present

## 2024-07-14 DIAGNOSIS — M9903 Segmental and somatic dysfunction of lumbar region: Secondary | ICD-10-CM | POA: Diagnosis not present

## 2024-07-14 DIAGNOSIS — M9905 Segmental and somatic dysfunction of pelvic region: Secondary | ICD-10-CM | POA: Diagnosis not present

## 2024-07-28 DIAGNOSIS — M9905 Segmental and somatic dysfunction of pelvic region: Secondary | ICD-10-CM | POA: Diagnosis not present

## 2024-07-28 DIAGNOSIS — M9903 Segmental and somatic dysfunction of lumbar region: Secondary | ICD-10-CM | POA: Diagnosis not present

## 2024-07-28 DIAGNOSIS — M955 Acquired deformity of pelvis: Secondary | ICD-10-CM | POA: Diagnosis not present

## 2024-07-28 DIAGNOSIS — M41125 Adolescent idiopathic scoliosis, thoracolumbar region: Secondary | ICD-10-CM | POA: Diagnosis not present

## 2024-08-05 DIAGNOSIS — H353123 Nonexudative age-related macular degeneration, left eye, advanced atrophic without subfoveal involvement: Secondary | ICD-10-CM | POA: Diagnosis not present

## 2024-08-05 DIAGNOSIS — H353114 Nonexudative age-related macular degeneration, right eye, advanced atrophic with subfoveal involvement: Secondary | ICD-10-CM | POA: Diagnosis not present

## 2024-08-05 DIAGNOSIS — H43811 Vitreous degeneration, right eye: Secondary | ICD-10-CM | POA: Diagnosis not present

## 2024-08-05 DIAGNOSIS — H43812 Vitreous degeneration, left eye: Secondary | ICD-10-CM | POA: Diagnosis not present

## 2024-08-05 DIAGNOSIS — H353221 Exudative age-related macular degeneration, left eye, with active choroidal neovascularization: Secondary | ICD-10-CM | POA: Diagnosis not present

## 2024-08-05 DIAGNOSIS — H3562 Retinal hemorrhage, left eye: Secondary | ICD-10-CM | POA: Diagnosis not present

## 2024-08-05 LAB — OPHTHALMOLOGY REPORT-SCANNED

## 2024-08-06 DIAGNOSIS — M955 Acquired deformity of pelvis: Secondary | ICD-10-CM | POA: Diagnosis not present

## 2024-08-06 DIAGNOSIS — M9905 Segmental and somatic dysfunction of pelvic region: Secondary | ICD-10-CM | POA: Diagnosis not present

## 2024-08-06 DIAGNOSIS — M9903 Segmental and somatic dysfunction of lumbar region: Secondary | ICD-10-CM | POA: Diagnosis not present

## 2024-08-06 DIAGNOSIS — M41125 Adolescent idiopathic scoliosis, thoracolumbar region: Secondary | ICD-10-CM | POA: Diagnosis not present

## 2024-08-07 ENCOUNTER — Encounter: Payer: Self-pay | Admitting: Optometrist

## 2024-08-07 DIAGNOSIS — H43812 Vitreous degeneration, left eye: Secondary | ICD-10-CM | POA: Diagnosis not present

## 2024-08-07 DIAGNOSIS — H353114 Nonexudative age-related macular degeneration, right eye, advanced atrophic with subfoveal involvement: Secondary | ICD-10-CM | POA: Diagnosis not present

## 2024-08-07 DIAGNOSIS — H43811 Vitreous degeneration, right eye: Secondary | ICD-10-CM | POA: Diagnosis not present

## 2024-08-07 DIAGNOSIS — H3562 Retinal hemorrhage, left eye: Secondary | ICD-10-CM | POA: Diagnosis not present

## 2024-08-07 DIAGNOSIS — H353123 Nonexudative age-related macular degeneration, left eye, advanced atrophic without subfoveal involvement: Secondary | ICD-10-CM | POA: Diagnosis not present

## 2024-08-07 DIAGNOSIS — H353221 Exudative age-related macular degeneration, left eye, with active choroidal neovascularization: Secondary | ICD-10-CM | POA: Diagnosis not present

## 2024-08-07 LAB — OPHTHALMOLOGY REPORT-SCANNED

## 2024-08-13 ENCOUNTER — Other Ambulatory Visit: Payer: Self-pay | Admitting: Internal Medicine

## 2024-08-13 DIAGNOSIS — M9903 Segmental and somatic dysfunction of lumbar region: Secondary | ICD-10-CM | POA: Diagnosis not present

## 2024-08-13 DIAGNOSIS — M41125 Adolescent idiopathic scoliosis, thoracolumbar region: Secondary | ICD-10-CM | POA: Diagnosis not present

## 2024-08-13 DIAGNOSIS — M9905 Segmental and somatic dysfunction of pelvic region: Secondary | ICD-10-CM | POA: Diagnosis not present

## 2024-08-13 DIAGNOSIS — M955 Acquired deformity of pelvis: Secondary | ICD-10-CM | POA: Diagnosis not present

## 2024-08-20 DIAGNOSIS — M9903 Segmental and somatic dysfunction of lumbar region: Secondary | ICD-10-CM | POA: Diagnosis not present

## 2024-08-20 DIAGNOSIS — M41125 Adolescent idiopathic scoliosis, thoracolumbar region: Secondary | ICD-10-CM | POA: Diagnosis not present

## 2024-08-20 DIAGNOSIS — M955 Acquired deformity of pelvis: Secondary | ICD-10-CM | POA: Diagnosis not present

## 2024-08-20 DIAGNOSIS — M9905 Segmental and somatic dysfunction of pelvic region: Secondary | ICD-10-CM | POA: Diagnosis not present

## 2024-08-25 DIAGNOSIS — M9905 Segmental and somatic dysfunction of pelvic region: Secondary | ICD-10-CM | POA: Diagnosis not present

## 2024-08-25 DIAGNOSIS — M955 Acquired deformity of pelvis: Secondary | ICD-10-CM | POA: Diagnosis not present

## 2024-08-25 DIAGNOSIS — M9903 Segmental and somatic dysfunction of lumbar region: Secondary | ICD-10-CM | POA: Diagnosis not present

## 2024-08-25 DIAGNOSIS — M41125 Adolescent idiopathic scoliosis, thoracolumbar region: Secondary | ICD-10-CM | POA: Diagnosis not present

## 2024-08-25 NOTE — Progress Notes (Unsigned)
 Ellouise Console, PA-C [address] [place], [place]  [address] Phone: [phone]   Gastroenterology Consultation  Referring Provider:     Cleatus Arlyss RAMAN, MD Primary Care Physician:  Cleatus Arlyss RAMAN, MD Primary Gastroenterologist:  Ellouise Console, PA-C / Dr. Gordy Starch  Reason for Consultation:     Discuss repeat colonoscopy; history of colon polyps        HPI:   Discussed the use of AI scribe software for clinical note transcription with the patient, who gave verbal consent to proceed. History of Present Illness Susan [name] is a 78 year old female with irritable bowel syndrome who presents with gastrointestinal symptoms.  She has history of adenomatous colon polyps and is due for repeat surveillance colonoscopy.  She experiences persistent bloating and describes her stools as 'almost like mush' but not watery, with approximately three bowel movements per day. She denies constipation or hard stools and feels incomplete bowel evacuation.  She reports a sensation of intermittent lower abdominal cramping which is relieved after a bowel movement, and typically lasts about thirty minutes. No blood in her stool or unusual weight loss.  Her brother has a history of Crohn's disease, and her father had several benign colon polyps.  03/2016 last colonoscopy by Dr. Starch: 5 mm polyp removed from ascending colon.  Pandiverticulosis.  Small internal hemorrhoids.  Good prep.  7-year repeat was due 03/2023.  PMH: Valvular heart disease, CAD, CHF, carotid stenosis, aortic, mitral, tricuspid valve regurgitation, IBS, NASH, HTN.,  Macular degeneration 12/2021 LVEF 60 to 65%.  Cardiologist Dr. Mady.  Takes 81 mg aspirin  daily.  No other blood thinners.   Past Medical History:  Diagnosis Date   Colon cancer screening    Diverticulosis of colon    Exudative age-related macular degeneration of right eye with active choroidal neovascularization (HCC) 03/23/2020   Hyperlipidemia     Hypertension    IBS (irritable bowel syndrome)    Macular degeneration    Migraine, unspecified, without mention of intractable migraine without mention of status migrainosus    NASH (nonalcoholic steatohepatitis)    Nonobstructive atherosclerosis of coronary artery 2016   Cardiac CTA with mild proximal LAD disease; CAC score 74.   Ocular migraine    Osteoporosis    on Fosamax for 5 years   Other abnormal glucose    Other chronic nonalcoholic liver disease    LFTs normalized with weight loss 2013   Other screening mammogram    Symptomatic menopausal or female climacteric states    Thyroid  disease    Tubular adenoma of colon    Urinary tract infection, site not specified     Past Surgical History:  Procedure Laterality Date   abdominal ultrasound  04/12/2004 & 6/06   positive gallstones   ANKLE FRACTURE SURGERY  ~2002   pinning   BTL  30+ years ago   CARPAL TUNNEL RELEASE  2025   CATARACT EXTRACTION  11/07/2011   B eyes   COLONOSCOPY  06/08/2006   Polyps, divertics (Dr. Jakie)   CT abdomen  04/29/2004   Positive gallstones   INTRAMEDULLARY (IM) NAIL INTERTROCHANTERIC Left 11/14/2020   Procedure: INTRAMEDULLARY (IM) NAIL INTERTROCHANTRIC;  Surgeon: Cleotilde Barrio, MD;  Location: [facility] ORS;  Service: Orthopedics;  Laterality: Left;   TONSILLECTOMY AND ADENOIDECTOMY  Age 57   ultrasound pelvis  04/06/2005   Negative    Prior to Admission medications   Medication Sig Start Date End Date Taking? Authorizing Provider  aspirin   81 MG tablet Take 81 mg by mouth daily.    [provider]  Cholecalciferol  (VITAMIN D3) 50 MCG (2000 UT) capsule Take 1 capsule (2,000 Units total) by mouth every other day. 02/11/24   Cleatus Arlyss RAMAN, MD  ezetimibe  (ZETIA ) 10 MG tablet TAKE 1 TABLET EVERY DAY 08/14/24   End, Lonni, MD  ibuprofen  (ADVIL ) 200 MG tablet Take 2 tablets (400 mg total) by mouth in the morning and at bedtime. With food. 06/21/23   Cleatus Arlyss RAMAN, MD   lisinopril -hydrochlorothiazide  (ZESTORETIC ) 20-25 MG tablet TAKE 1 TABLET EVERY DAY 08/14/24   End, Lonni, MD  vitamin B-12 (CYANOCOBALAMIN ) 1000 MCG tablet Take 1,000 mcg by mouth daily. Patient takes every 2 days    [provider]    Family History  Problem Relation Age of Onset   Depression Mother        Manic depression   Diabetes Mother        Type 2   Breast cancer Mother    Heart disease Mother    Colon polyps Father    Heart attack Father 94   Breast cancer Maternal Aunt    Breast cancer Paternal Aunt    Crohn's disease Brother    Osteoporosis Brother    Cancer Neg Hx    Colon cancer Neg Hx      Social History   Tobacco Use   Smoking status: Former    Current packs/day: 0.00    Types: Cigarettes    Quit date: 03/19/2001    Years since quitting: 23.4   Smokeless tobacco: Never   Tobacco comments:    Quit in 2001  Vaping Use   Vaping status: Never Used  Substance Use Topics   Alcohol use: Yes    Comment: 1 glass of wine or 1/2 beer on occasion   Drug use: No    Allergies as of 08/26/2024 - Review Complete 08/26/2024  Allergen Reaction Noted   Lipitor [atorvastatin  calcium ] Other (See Comments) 05/27/2018   Penicillins Swelling and Rash    Sulfonamide derivatives Rash     Review of Systems:    All systems reviewed and negative except where noted in HPI.   Physical Exam:  BP 110/60   Pulse 78   Ht 4' 11 (1.499 m)   Wt 136 lb 8 oz (61.9 kg)   BMI 27.57 kg/m  No LMP recorded. Patient is postmenopausal.  General:   Alert,  Well-developed, well-nourished, pleasant and cooperative in NAD Lungs:  Respirations even and unlabored.  Clear throughout to auscultation.   No wheezes, crackles, or rhonchi. No acute distress. Heart:  Regular rate and rhythm; no murmurs, clicks, rubs, or gallops. Abdomen:  Normal bowel sounds.  No bruits.  Soft, and non-distended without masses, hepatosplenomegaly or hernias noted.  No Tenderness.  No guarding or  rebound tenderness.    Neurologic:  Alert and oriented x3;  grossly normal neurologically. Psych:  Alert and cooperative. Normal mood and affect.   Imaging Studies: No results found.  Labs: CBC    Component Value Date/Time   WBC 5.1 01/04/2024 0929   RBC 4.25 01/04/2024 0929   HGB 13.1 01/04/2024 0929   HGB 13.7 06/09/2020 1350   HCT 39.8 01/04/2024 0929   HCT 38.5 06/09/2020 1350   PLT 201.0 01/04/2024 0929   PLT 188 06/09/2020 1350   MCV 93.7 01/04/2024 0929   MCV 92 06/09/2020 1350    CMP     Component Value Date/Time   NA  141 01/04/2024 0929   NA 140 06/09/2020 1350   K 4.0 01/04/2024 0929   CL 102 01/04/2024 0929   CO2 31 01/04/2024 0929   GLUCOSE 92 01/04/2024 0929   BUN 26 (H) 01/04/2024 0929   BUN 17 06/09/2020 1350   CREATININE 0.65 01/04/2024 0929   CREATININE 0.83 10/17/2018 0000   CREATININE 0.77 01/12/2012 1515   CALCIUM  9.1 01/04/2024 0929   PROT 6.9 01/04/2024 0929   PROT 6.9 06/09/2020 1350   ALBUMIN 4.2 01/04/2024 0929   ALBUMIN 4.3 06/09/2020 1350   AST 20 01/04/2024 0929   AST 40 10/17/2018 0000   ALT 14 01/04/2024 0929   ALT 33 10/17/2018 0000   ALKPHOS 48 01/04/2024 0929   BILITOT 0.6 01/04/2024 0929   BILITOT 0.4 06/09/2020 1350   GFRNONAA >60 10/06/2022 1059   GFRAA 92 06/09/2020 1350    Assessment and Plan:   KEERAT DENICOLA is a 78 y.o. y/o female has been referred for:   1.  History of adenomatous colon polyps - Scheduling Colonoscopy I discussed risks of colonoscopy with patient to include risk of bleeding, colon perforation, and risk of sedation.  Patient expressed understanding and agrees to proceed with colonoscopy.   2.  IBS-D - Recommended IB Guard (peppermint oil) 2 capsules twice daily for colon spasm relief.  Samples given.  Follow up as needed based on colonoscopy results and GI symptoms.  Ellouise Console, PA-C

## 2024-08-26 ENCOUNTER — Encounter: Payer: Self-pay | Admitting: Physician Assistant

## 2024-08-26 ENCOUNTER — Encounter: Payer: Self-pay | Admitting: Internal Medicine

## 2024-08-26 ENCOUNTER — Ambulatory Visit: Admitting: Physician Assistant

## 2024-08-26 VITALS — BP 110/60 | HR 78 | Ht 59.0 in | Wt 136.5 lb

## 2024-08-26 DIAGNOSIS — Z860100 Personal history of colon polyps, unspecified: Secondary | ICD-10-CM

## 2024-08-26 DIAGNOSIS — K580 Irritable bowel syndrome with diarrhea: Secondary | ICD-10-CM | POA: Diagnosis not present

## 2024-08-26 MED ORDER — NA SULFATE-K SULFATE-MG SULF 17.5-3.13-1.6 GM/177ML PO SOLN: 1.0000 | Freq: Once | ORAL | 0 refills | Status: AC

## 2024-08-26 MED ORDER — NA SULFATE-K SULFATE-MG SULF 17.5-3.13-1.6 GM/177ML PO SOLN: 1.0000 | Freq: Once | ORAL | 0 refills | Status: DC

## 2024-08-26 NOTE — Patient Instructions (Addendum)
   For Irritable Bowel Syndrome / Colon Spasm / Abdominal Cramps: IB Gard (Peppermint Oil) - Over the Counter Take 2 capsules Twice daily   You have been scheduled for a Colonoscopy. Please follow written instructions given to you at your visit today.   If you use inhalers (even only as needed), please bring them with you on the day of your procedure.  DO NOT TAKE 7 DAYS PRIOR TO TEST- Trulicity (dulaglutide) Ozempic, Wegovy (semaglutide) Mounjaro (tirzepatide) Bydureon Bcise (exanatide extended release)  DO NOT TAKE 1 DAY PRIOR TO YOUR TEST Rybelsus (semaglutide) Adlyxin (lixisenatide) Victoza (liraglutide) Byetta (exanatide) ___________________________________________________________________________  Please follow up sooner if symptoms increase or worsen   Due to recent changes in healthcare laws, you may see the results of your imaging and laboratory studies on MyChart before your provider has had a chance to review them.  We understand that in some cases there may be results that are confusing or concerning to you. Not all laboratory results come back in the same time frame and the provider may be waiting for multiple results in order to interpret others.  Please give us  48 hours in order for your provider to thoroughly review all the results before contacting the office for clarification of your results.   Thank you for trusting me with your gastrointestinal care!   Ellouise Console, PA-C _______________________________________________________  If your blood pressure at your visit was 140/90 or greater, please contact your primary care physician to follow up on this.  _______________________________________________________  If you are age 52 or older, your body mass index should be between 23-30. Your Body mass index is 27.57 kg/m. If this is out of the aforementioned range listed, please consider follow up with your Primary Care Provider.  If you are age 41 or younger, your  body mass index should be between 19-25. Your Body mass index is 27.57 kg/m. If this is out of the aformentioned range listed, please consider follow up with your Primary Care Provider.   ________________________________________________________  The [facility] providers would like to encourage you to use MYCHART to communicate with providers for non-urgent requests or questions.  Due to long hold times on the telephone, sending your provider a message by [facility] may be a faster and more efficient way to get a response.  Please allow 48 business hours for a response.  Please remember that this is for non-urgent requests.  _______________________________________________________

## 2024-08-29 ENCOUNTER — Ambulatory Visit: Admitting: Internal Medicine

## 2024-08-29 ENCOUNTER — Encounter: Payer: Self-pay | Admitting: Internal Medicine

## 2024-08-29 VITALS — BP 115/49 | HR 82 | Temp 97.7°F | Resp 10 | Ht 59.0 in | Wt 136.0 lb

## 2024-08-29 DIAGNOSIS — D122 Benign neoplasm of ascending colon: Secondary | ICD-10-CM | POA: Diagnosis not present

## 2024-08-29 DIAGNOSIS — Z860100 Personal history of colon polyps, unspecified: Secondary | ICD-10-CM

## 2024-08-29 DIAGNOSIS — K635 Polyp of colon: Secondary | ICD-10-CM | POA: Diagnosis not present

## 2024-08-29 DIAGNOSIS — K5730 Diverticulosis of large intestine without perforation or abscess without bleeding: Secondary | ICD-10-CM | POA: Diagnosis not present

## 2024-08-29 DIAGNOSIS — K648 Other hemorrhoids: Secondary | ICD-10-CM

## 2024-08-29 DIAGNOSIS — Z860101 Personal history of adenomatous and serrated colon polyps: Secondary | ICD-10-CM

## 2024-08-29 DIAGNOSIS — Z1211 Encounter for screening for malignant neoplasm of colon: Secondary | ICD-10-CM | POA: Diagnosis not present

## 2024-08-29 DIAGNOSIS — D120 Benign neoplasm of cecum: Secondary | ICD-10-CM

## 2024-08-29 MED ORDER — SODIUM CHLORIDE 0.9 % IV SOLN: 500.0000 mL | INTRAVENOUS | Status: DC

## 2024-08-29 NOTE — Op Note (Signed)
 [facility] Patient Name: Susan [name] Procedure Date: 08/29/2024 2:38 PM MRN: [id number] Endoscopist: Gordy CHRISTELLA Starch , MD, [id number] Age: 78 Referring MD:  Date of Birth: 1946/06/06 Gender: Female Account #: [id number] Procedure:                Colonoscopy Indications:              High risk colon cancer surveillance: Personal                            history of non-advanced adenoma, Last colonoscopy:                            May 2017 (TA x 1) Medicines:                Monitored Anesthesia Care Procedure:                Pre-Anesthesia Assessment:                           - Prior to the procedure, a History and Physical                            was performed, and patient medications and                            allergies were reviewed. The patient's tolerance of                            previous anesthesia was also reviewed. The risks                            and benefits of the procedure and the sedation                            options and risks were discussed with the patient.                            All questions were answered, and informed consent                            was obtained. Prior Anticoagulants: The patient has                            taken no anticoagulant or antiplatelet agents. ASA                            Grade Assessment: II - A patient with mild systemic                            disease. After reviewing the risks and benefits,                            the patient was deemed in satisfactory condition to  undergo the procedure.                           After obtaining informed consent, the colonoscope                            was passed under direct vision. Throughout the                            procedure, the patient's blood pressure, pulse, and                            oxygen saturations were monitored continuously. The                            Olympus Scope [id number] was introduced  through the                            anus and advanced to the cecum, identified by                            appendiceal orifice and ileocecal valve. The                            colonoscopy was performed without difficulty. The                            patient tolerated the procedure well. The quality                            of the bowel preparation was adequate. The                            ileocecal valve, appendiceal orifice, and rectum                            were photographed. Scope In: 2:51:25 PM Scope Out: 3:08:06 PM Scope Withdrawal Time: 0 hours 11 minutes 48 seconds  Total Procedure Duration: 0 hours 16 minutes 41 seconds  Findings:                 The digital rectal exam was normal.                           Two sessile polyps were found in the cecum. The                            polyps were 2 to 3 mm in size. These polyps were                            removed with a cold snare. Resection and retrieval                            were complete.  A 4 mm polyp was found in the ascending colon. The                            polyp was sessile. The polyp was removed with a                            cold snare. Resection and retrieval were complete.                           Many medium-mouthed and small-mouthed diverticula                            were found in the sigmoid colon, descending colon,                            transverse colon, hepatic flexure and ascending                            colon.                           Internal hemorrhoids were found during                            retroflexion. The hemorrhoids were medium-sized. Complications:            No immediate complications. Estimated Blood Loss:     Estimated blood loss was minimal. Impression:               - Two 2 to 3 mm polyps in the cecum, removed with a                            cold snare. Resected and retrieved.                           - One 4 mm  polyp in the ascending colon, removed                            with a cold snare. Resected and retrieved.                           - Severe diverticulosis in the sigmoid colon,                            moderate in the descending colon, in the transverse                            colon, at the hepatic flexure and in the ascending                            colon.                           - Internal hemorrhoids. Recommendation:           -  Patient has a contact number available for                            emergencies. The signs and symptoms of potential                            delayed complications were discussed with the                            patient. Return to normal activities tomorrow.                            Written discharge instructions were provided to the                            patient.                           - Resume previous diet.                           - Continue present medications.                           - Await pathology results.                           - No recommendation at this time regarding repeat                            colonoscopy due to age at next colonoscopy interval. Gordy CHRISTELLA Starch, MD 08/29/2024 3:13:01 PM This report has been signed electronically.

## 2024-08-29 NOTE — Progress Notes (Signed)
 See office visit dated 08/26/2024 for details and current H&P  Patient presenting for surveillance colonoscopy given history of adenomatous polyps.  She is appropriate for [facility] colonoscopy today.

## 2024-08-29 NOTE — Patient Instructions (Signed)
 Handouts given: Polyps, Diverticulosis, Hemorrhoids Resume previous diet. Continue present medications.  Await pathology results. No recommendation at this time regarding repeat colonoscopy due to age at next colonoscopy interval.  YOU HAD AN ENDOSCOPIC PROCEDURE TODAY AT THE  ENDOSCOPY CENTER:   Refer to the procedure report that was given to you for any specific questions about what was found during the examination.  If the procedure report does not answer your questions, please call your gastroenterologist to clarify.  If you requested that your care partner not be given the details of your procedure findings, then the procedure report has been included in a sealed envelope for you to review at your convenience later.  YOU SHOULD EXPECT: Some feelings of bloating in the abdomen. Passage of more gas than usual.  Walking can help get rid of the air that was put into your GI tract during the procedure and reduce the bloating. If you had a lower endoscopy (such as a colonoscopy or flexible sigmoidoscopy) you may notice spotting of blood in your stool or on the toilet paper. If you underwent a bowel prep for your procedure, you may not have a normal bowel movement for a few days.  Please Note:  You might notice some irritation and congestion in your nose or some drainage.  This is from the oxygen used during your procedure.  There is no need for concern and it should clear up in a day or so.  SYMPTOMS TO REPORT IMMEDIATELY:  Following lower endoscopy (colonoscopy or flexible sigmoidoscopy):  Excessive amounts of blood in the stool  Significant tenderness or worsening of abdominal pains  Swelling of the abdomen that is new, acute  Fever of 100F or higher  For urgent or emergent issues, a gastroenterologist can be reached at any hour by calling [phone]. Do not use MyChart messaging for urgent concerns.    DIET:  We do recommend a small meal at first, but then you may proceed to  your regular diet.  Drink plenty of fluids but you should avoid alcoholic beverages for 24 hours.  ACTIVITY:  You should plan to take it easy for the rest of today and you should NOT DRIVE or use heavy machinery until tomorrow (because of the sedation medicines used during the test).    FOLLOW UP: Our staff will call the number listed on your records the next business day following your procedure.  We will call around 7:15- 8:00 am to check on you and address any questions or concerns that you may have regarding the information given to you following your procedure. If we do not reach you, we will leave a message.     If any biopsies were taken you will be contacted by phone or by letter within the next 1-3 weeks.  Please call us  at [phone] if you have not heard about the biopsies in 3 weeks.    SIGNATURES/CONFIDENTIALITY: You and/or your care partner have signed paperwork which will be entered into your electronic medical record.  These signatures attest to the fact that that the information above on your After Visit Summary has been reviewed and is understood.  Full responsibility of the confidentiality of this discharge information lies with you and/or your care-partner.

## 2024-08-29 NOTE — Progress Notes (Signed)
 Called to room to assist during endoscopic procedure.  Patient ID and intended procedure confirmed with present staff. Received instructions for my participation in the procedure from the performing physician.

## 2024-08-29 NOTE — Progress Notes (Signed)
 Pt's states no medical or surgical changes since previsit or office visit.

## 2024-09-01 ENCOUNTER — Telehealth: Payer: Self-pay | Admitting: *Deleted

## 2024-09-01 DIAGNOSIS — M9905 Segmental and somatic dysfunction of pelvic region: Secondary | ICD-10-CM | POA: Diagnosis not present

## 2024-09-01 DIAGNOSIS — M955 Acquired deformity of pelvis: Secondary | ICD-10-CM | POA: Diagnosis not present

## 2024-09-01 DIAGNOSIS — M41125 Adolescent idiopathic scoliosis, thoracolumbar region: Secondary | ICD-10-CM | POA: Diagnosis not present

## 2024-09-01 DIAGNOSIS — M9903 Segmental and somatic dysfunction of lumbar region: Secondary | ICD-10-CM | POA: Diagnosis not present

## 2024-09-01 NOTE — Telephone Encounter (Signed)
  Follow up Call-     08/29/2024    2:12 PM  Call back number  Post procedure Call Back phone  # [phone]  Permission to leave phone message Yes     Patient questions:  Do you have a fever, pain , or abdominal swelling? No. Pain Score  0 *  Have you tolerated food without any problems? Yes.    Have you been able to return to your normal activities? Yes.    Do you have any questions about your discharge instructions: Diet   No. Medications  no Follow up visit  No.  Do you have questions or concerns about your Care? No.  Actions: * If pain score is 4 or above: No action needed, pain <4.

## 2024-09-02 DIAGNOSIS — H353221 Exudative age-related macular degeneration, left eye, with active choroidal neovascularization: Secondary | ICD-10-CM | POA: Diagnosis not present

## 2024-09-03 LAB — SURGICAL PATHOLOGY

## 2024-09-04 ENCOUNTER — Ambulatory Visit: Payer: Self-pay | Admitting: Internal Medicine

## 2024-09-08 DIAGNOSIS — M9905 Segmental and somatic dysfunction of pelvic region: Secondary | ICD-10-CM | POA: Diagnosis not present

## 2024-09-08 DIAGNOSIS — M41125 Adolescent idiopathic scoliosis, thoracolumbar region: Secondary | ICD-10-CM | POA: Diagnosis not present

## 2024-09-08 DIAGNOSIS — M9903 Segmental and somatic dysfunction of lumbar region: Secondary | ICD-10-CM | POA: Diagnosis not present

## 2024-09-08 DIAGNOSIS — M955 Acquired deformity of pelvis: Secondary | ICD-10-CM | POA: Diagnosis not present

## 2024-09-22 DIAGNOSIS — M955 Acquired deformity of pelvis: Secondary | ICD-10-CM | POA: Diagnosis not present

## 2024-09-22 DIAGNOSIS — M9905 Segmental and somatic dysfunction of pelvic region: Secondary | ICD-10-CM | POA: Diagnosis not present

## 2024-09-22 DIAGNOSIS — M41125 Adolescent idiopathic scoliosis, thoracolumbar region: Secondary | ICD-10-CM | POA: Diagnosis not present

## 2024-09-22 DIAGNOSIS — M9903 Segmental and somatic dysfunction of lumbar region: Secondary | ICD-10-CM | POA: Diagnosis not present

## 2024-10-09 DIAGNOSIS — H43812 Vitreous degeneration, left eye: Secondary | ICD-10-CM | POA: Diagnosis not present

## 2024-10-09 DIAGNOSIS — H353123 Nonexudative age-related macular degeneration, left eye, advanced atrophic without subfoveal involvement: Secondary | ICD-10-CM | POA: Diagnosis not present

## 2024-10-09 DIAGNOSIS — H02836 Dermatochalasis of left eye, unspecified eyelid: Secondary | ICD-10-CM | POA: Diagnosis not present

## 2024-10-09 DIAGNOSIS — H353114 Nonexudative age-related macular degeneration, right eye, advanced atrophic with subfoveal involvement: Secondary | ICD-10-CM | POA: Diagnosis not present

## 2024-10-09 DIAGNOSIS — H3562 Retinal hemorrhage, left eye: Secondary | ICD-10-CM | POA: Diagnosis not present

## 2024-10-09 DIAGNOSIS — H43811 Vitreous degeneration, right eye: Secondary | ICD-10-CM | POA: Diagnosis not present

## 2024-10-09 DIAGNOSIS — H02833 Dermatochalasis of right eye, unspecified eyelid: Secondary | ICD-10-CM | POA: Diagnosis not present

## 2024-10-14 ENCOUNTER — Ambulatory Visit

## 2024-10-21 ENCOUNTER — Ambulatory Visit

## 2024-10-21 ENCOUNTER — Telehealth: Payer: Self-pay

## 2024-10-21 ENCOUNTER — Other Ambulatory Visit: Payer: Self-pay | Admitting: Family Medicine

## 2024-10-21 DIAGNOSIS — M810 Age-related osteoporosis without current pathological fracture: Secondary | ICD-10-CM

## 2024-10-21 MED ORDER — DENOSUMAB 60 MG/ML ~~LOC~~ SOSY
60.0000 mg | PREFILLED_SYRINGE | Freq: Once | SUBCUTANEOUS | Status: AC
  Administered 2024-11-13: 60 mg via SUBCUTANEOUS

## 2024-10-21 NOTE — Telephone Encounter (Signed)
 Prolia VOB initiated via [email or web address]  Next Prolia inj DUE: NOW

## 2024-10-23 ENCOUNTER — Other Ambulatory Visit (HOSPITAL_COMMUNITY): Payer: Self-pay

## 2024-10-23 NOTE — Telephone Encounter (Signed)
 SABRA

## 2024-10-23 NOTE — Telephone Encounter (Signed)
 Pt ready for scheduling for PROLIA  on or after : 10/23/24  Option# 1: Buy/Bill (Office supplied medication)  Out-of-pocket cost due at time of clinic visit: $0  Number of injection/visits approved: 2  Primary: [employer] Prolia  co-insurance: 0% Admin fee co-insurance: 0%  Secondary: [employer] Prolia  co-insurance:  Admin fee co-insurance:   Medical Benefit Details: Date Benefits were checked: 10/23/24 Deductible: NO/ Coinsurance: 0%/ Admin Fee: 0%  Prior Auth: APPROVED PA# [id number] Expiration Date: 05/24/22-11/05/24   # of doses approved:  ----------------------------------------------------------------------- Option# 2- Med Obtained from pharmacy:  Pharmacy benefit: Copay $902.18 (Paid to pharmacy) Admin Fee: 0% (Pay at clinic)  Prior Auth: N/A PA# Expiration Date:   # of doses approved:   If patient wants fill through the pharmacy benefit please send prescription to: [facility], and include estimated need by date in rx notes. Pharmacy will ship medication directly to the office.  Patient NOT eligible for Prolia  Copay Card. Copay Card can make patient's cost as little as $25. Link to apply: https://www.amgensupportplus.com/copay  ** This summary of benefits is an estimation of the patient's out-of-pocket cost. Exact cost may very based on individual plan coverage.

## 2024-10-24 NOTE — Telephone Encounter (Signed)
 LM for pt to return my call.   E2C2 IS NOT TO SCHEDULE THIS APPOINTMENT, PT NEEDS TO SPEAK TO [name] IN THE OFFICE.

## 2024-11-04 ENCOUNTER — Other Ambulatory Visit: Payer: Self-pay | Admitting: *Deleted

## 2024-11-04 DIAGNOSIS — M810 Age-related osteoporosis without current pathological fracture: Secondary | ICD-10-CM

## 2024-11-05 ENCOUNTER — Telehealth: Payer: Self-pay

## 2024-11-05 NOTE — Telephone Encounter (Signed)
"  Wrong office  "

## 2024-11-05 NOTE — Telephone Encounter (Signed)
 Copied from CRM #[id number]. Topic: General - Other >> Nov 04, 2024 11:39 AM Thersia C wrote: Reason for CRM: Patient called in regarding prolia  shot and lab , Patient stated when she came in before she was unable to get shot because she needed labs , no order in for labs to be schedule  patient also stated she has to speak to someone specific for the prolia  shot  would like a callback as soon as possible as her

## 2024-11-10 ENCOUNTER — Other Ambulatory Visit (INDEPENDENT_AMBULATORY_CARE_PROVIDER_SITE_OTHER)

## 2024-11-10 DIAGNOSIS — M810 Age-related osteoporosis without current pathological fracture: Secondary | ICD-10-CM | POA: Diagnosis not present

## 2024-11-10 LAB — BASIC METABOLIC PANEL WITH GFR
BUN: 22 mg/dL (ref 6–23)
CO2: 31 meq/L (ref 19–32)
Calcium: 9.4 mg/dL (ref 8.4–10.5)
Chloride: 102 meq/L (ref 96–112)
Creatinine, Ser: 0.63 mg/dL (ref 0.40–1.20)
GFR: 84.4 mL/min
Glucose, Bld: 100 mg/dL — ABNORMAL HIGH (ref 70–99)
Potassium: 4.2 meq/L (ref 3.5–5.1)
Sodium: 139 meq/L (ref 135–145)

## 2024-11-13 ENCOUNTER — Ambulatory Visit

## 2024-11-13 DIAGNOSIS — M810 Age-related osteoporosis without current pathological fracture: Secondary | ICD-10-CM

## 2024-11-13 MED ORDER — DENOSUMAB 60 MG/ML ~~LOC~~ SOSY: 60.0000 mg | PREFILLED_SYRINGE | Freq: Once | SUBCUTANEOUS | Status: AC

## 2024-11-13 NOTE — Progress Notes (Signed)
 After obtaining consent, and per orders of Dr. Avelina, injection of Prolia  given SQ to left arm by Sebastian Rosina Helling. Patient tolerated injection well.

## 2024-11-18 ENCOUNTER — Ambulatory Visit: Payer: Self-pay

## 2024-11-18 NOTE — Telephone Encounter (Signed)
 FYI Only or Action Required?: FYI only for provider: appointment scheduled on 11/19/24 with alternative office .  Patient was last seen in primary care on 02/11/2024 by Cleatus Arlyss RAMAN, MD.  Called Nurse Triage reporting Sinusitis and Sore Throat.  Symptoms began Sunday .  Interventions attempted: OTC medications: coricidin .  Symptoms are: stable.  Triage Disposition: See PCP When Office is Open (Within 3 Days)  Patient/caregiver understands and will follow disposition?: Yes            Reason for Disposition  [1] Sore throat with cough/cold symptoms AND [2] present > 5 days  Answer Assessment - Initial Assessment Questions Patient reports Sunday started feeling blah started sneezing, wasn't coughing then. Runny nose. Thought was a cold. Yesterday got up throat was sore. Mouth breather when gets up in morning sore throat improved , not gone away. Aching all over. No fever , no chills. Once in intermittent cough. Feel like crap main symptom is kind feels like sinus headache  pain above eyebrows now pain is 4/10 no redness or swelling.  taking coricidin hbp helps a little not a whole lot. Able to swallow liquids and solids.  Booked appointment tomorrow at [facility]  per pt request there was no opeining at patient PCP office today or tomorrow. Patient will seek UC with new or worsening symptoms in meantime.      1. ONSET: When did the throat start hurting? (Hours or days ago)      Sunday  2. SEVERITY: How bad is the sore throat? (Scale 1-10; mild, moderate or severe)     1/10 now worse when gets up in morning  3. STREP EXPOSURE: Has there been any exposure to strep within the past week? If Yes, ask: What type of contact occurred?      Unknown  4.  VIRAL SYMPTOMS: Are there any symptoms of a cold, such as a runny nose, cough, hoarse voice or red eyes?      Cough , sinus pain, body aches  5. FEVER: Do you have a fever? If Yes, ask: What is your temperature,  how was it measured, and when did it start?     Denies  6. PUS ON THE TONSILS: Is there pus on the tonsils in the back of your throat?     Not reported  7. OTHER SYMPTOMS: Do you have any other symptoms? (e.g., difficulty breathing, headache, rash) Body aches, sinus pain, intermittent cough, generalized  fatigue , eating and drinking  urination baseline        Patient denies the following chest pain, shortness of breath, fever, vomiting  Protocols used: Sore Throat-A-AH Copied from CRM #[id number]. Topic: Clinical - Red Word Triage >> Nov 18, 2024  8:54 AM Amy B wrote: Red Word that prompted transfer to Nurse Triage: Sore, productive cough, chills, headache

## 2024-11-19 ENCOUNTER — Ambulatory Visit: Admitting: Nurse Practitioner

## 2024-11-19 ENCOUNTER — Encounter: Payer: Self-pay | Admitting: Nurse Practitioner

## 2024-11-19 VITALS — BP 125/76 | HR 92 | Temp 98.2°F | Ht 59.0 in | Wt 136.2 lb

## 2024-11-19 DIAGNOSIS — J069 Acute upper respiratory infection, unspecified: Secondary | ICD-10-CM | POA: Diagnosis not present

## 2024-11-19 DIAGNOSIS — J029 Acute pharyngitis, unspecified: Secondary | ICD-10-CM

## 2024-11-19 DIAGNOSIS — R051 Acute cough: Secondary | ICD-10-CM

## 2024-11-19 LAB — POCT INFLUENZA A/B
Influenza A, POC: NEGATIVE
Influenza B, POC: NEGATIVE

## 2024-11-19 LAB — POC COVID19 BINAXNOW: SARS Coronavirus 2 Ag: NEGATIVE

## 2024-11-19 MED ORDER — BENZONATATE 200 MG PO CAPS: 200.0000 mg | ORAL_CAPSULE | Freq: Three times a day (TID) | ORAL | 0 refills | Status: AC | PRN

## 2024-11-19 NOTE — Progress Notes (Signed)
 " Susan Glance, NP-C Phone: [phone]  Susan [name] is a 79 y.o. female who presents today for sore throat and cough.   Discussed the use of AI scribe software for clinical note transcription with the patient, who gave verbal consent to proceed.  History of Present Illness   Susan [name] is a 79 year old female who presents with cough and sore throat.  She has been experiencing a cough and sore throat since Sunday, with the sore throat being more noticeable in the mornings and evenings. Her voice was hoarse yesterday but has improved today. The cough is occasionally productive.  She reports feeling congested, primarily in her head, with drainage running down her throat. She has a dull headache but no recent exposure to anyone known to be sick. No fever, significant new body aches, shortness of breath, nausea, or vomiting.  She is concerned about potential exposure to her four-month-old great-granddaughter, whom she visited on Friday. Her COVID-19 and flu tests were negative.  Since the onset of symptoms, she has been taking Coricidin and Tylenol . Her symptoms have remained about the same since Sunday, with improvement noted today.      Tobacco Use History[1]  Medications Ordered Prior to Encounter[2]   ROS see history of present illness  Objective  Physical Exam Vitals:   11/19/24 1421  BP: 125/76  Pulse: 92  Temp: 98.2 F (36.8 C)  SpO2: 99%    BP Readings from Last 3 Encounters:  11/19/24 125/76  08/29/24 (!) 115/49  08/26/24 110/60   Wt Readings from Last 3 Encounters:  11/19/24 136 lb 3.2 oz (61.8 kg)  08/29/24 136 lb (61.7 kg)  08/26/24 136 lb 8 oz (61.9 kg)    Physical Exam Constitutional:      General: She is not in acute distress.    Appearance: Normal appearance.  HENT:     Head: Normocephalic.     Right Ear: Tympanic membrane normal.     Left Ear: Tympanic membrane normal.     Nose: Congestion present.     Mouth/Throat:     Mouth:  Mucous membranes are moist.     Pharynx: Oropharynx is clear. No posterior oropharyngeal erythema.  Eyes:     Conjunctiva/sclera: Conjunctivae normal.     Pupils: Pupils are equal, round, and reactive to light.  Cardiovascular:     Rate and Rhythm: Normal rate and regular rhythm.     Heart sounds: Normal heart sounds.  Pulmonary:     Effort: Pulmonary effort is normal.     Breath sounds: Normal breath sounds.  Lymphadenopathy:     Cervical: No cervical adenopathy.  Skin:    General: Skin is warm and dry.  Neurological:     General: No focal deficit present.     Mental Status: She is alert.  Psychiatric:        Mood and Affect: Mood normal.        Behavior: Behavior normal.      Assessment/Plan: Please see individual problem list.  Viral URI with cough Assessment & Plan: Symptoms suggest a viral etiology and are likely self-limiting, improvement noted. Do not feel that antibiotics are warranted at this time. Continue Coricidin for symptom relief and take Tessalon  Perles (benzonatate ) for cough, three times daily as needed. Hydration and rest are encouraged. Monitor symptoms and contact the clinic if persisting beyond 5-7 days or worsens.   Orders: -     Benzonatate ; Take 1 capsule (200 mg total) by mouth 3 (  three) times daily as needed for cough.  Dispense: 30 capsule; Refill: 0  Acute cough -     POC COVID-19 BinaxNow -     POCT Influenza A/B  Sore throat -     POC COVID-19 BinaxNow -     POCT Influenza A/B     Return if symptoms worsen or fail to improve.   Susan Glance, NP-C [facility] [place]     [1]  Social History Tobacco Use  Smoking Status Former   Current packs/day: 0.00   Types: Cigarettes   Quit date: 03/19/2001   Years since quitting: 23.6  Smokeless Tobacco Never  Tobacco Comments   Quit in 2001  [2]  Current Outpatient Medications on File Prior to Visit  Medication Sig Dispense Refill   ABRYSVO 120 MCG/0.5ML  injection      aspirin  81 MG tablet Take 81 mg by mouth daily.     BOOSTRIX 5-2.5-18.5 LF-MCG/0.5 injection      Cholecalciferol  (VITAMIN D3) 50 MCG (2000 UT) capsule Take 1 capsule (2,000 Units total) by mouth every other day.     ezetimibe  (ZETIA ) 10 MG tablet TAKE 1 TABLET EVERY DAY 90 tablet 1   FLUZONE HIGH-DOSE 0.5 ML injection      ibuprofen  (ADVIL ) 200 MG tablet Take 2 tablets (400 mg total) by mouth in the morning and at bedtime. With food.     lisinopril -hydrochlorothiazide  (ZESTORETIC ) 20-25 MG tablet TAKE 1 TABLET EVERY DAY 90 tablet 1   SHINGRIX injection      vitamin B-12 (CYANOCOBALAMIN ) 1000 MCG tablet Take 1,000 mcg by mouth daily. Patient takes every 2 days     Current Facility-Administered Medications on File Prior to Visit  Medication Dose Route Frequency Provider Last Rate Last Admin   denosumab  (PROLIA ) injection 60 mg  60 mg Subcutaneous Once Duncan, Graham S, MD       [START ON 05/27/2025] denosumab  (PROLIA ) injection 60 mg  60 mg Subcutaneous Once Bedsole, Amy E, MD       "

## 2024-11-19 NOTE — Assessment & Plan Note (Addendum)
 Symptoms suggest a viral etiology and are likely self-limiting, improvement noted. Do not feel that antibiotics are warranted at this time. Continue Coricidin for symptom relief and take Tessalon  Perles (benzonatate ) for cough, three times daily as needed. Hydration and rest are encouraged. Monitor symptoms and contact the clinic if persisting beyond 5-7 days or worsens.

## 2024-11-20 ENCOUNTER — Ambulatory Visit: Payer: Self-pay | Admitting: Family Medicine

## 2024-11-24 ENCOUNTER — Telehealth: Payer: Self-pay

## 2024-11-24 ENCOUNTER — Other Ambulatory Visit (HOSPITAL_COMMUNITY): Payer: Self-pay

## 2024-11-24 NOTE — Telephone Encounter (Signed)
 Pharmacy Patient Advocate Encounter  Received notification from [employer] that Prior Authorization for PROLIA  has been APPROVED from 05/24/22 to 11/05/25   PA #/Case ID/Reference #: [id number]   *MEDICAL (BUY/BILL)

## 2024-11-24 NOTE — Progress Notes (Signed)
 I've sent message to PA team.

## 2024-11-24 NOTE — Telephone Encounter (Signed)
 PA request has been Submitted. New Encounter has been or will be created for follow up. For additional info see Pharmacy Prior Auth telephone encounter from 11/24/24.

## 2024-11-24 NOTE — Telephone Encounter (Signed)
 Pharmacy Patient Advocate Encounter   Received notification from Pt Calls Messages that prior authorization for PROLIA  is required/requested.   Insurance verification completed.   The patient is insured through [employer].   Per test claim: PA required; PA submitted to above mentioned insurance via Latent Key/confirmation #/EOC [id number] Status is pending    PROLIA  RECEIVED 11/13/24

## 2024-11-24 NOTE — Telephone Encounter (Signed)
 Need new PA for Prolia  for 2026

## 2024-11-25 NOTE — Telephone Encounter (Signed)
 Per Danna Hummer, CMA prolia  referral has now been linked with [place].

## 2025-02-10 ENCOUNTER — Ambulatory Visit

## 2025-02-25 ENCOUNTER — Other Ambulatory Visit
